# Patient Record
Sex: Female | Born: 1945 | Race: White | Hispanic: No | Marital: Married | Smoking: Former smoker
Health system: Southern US, Community
[De-identification: ages and names within clinical notes are randomized; demographics above are authoritative.]

## PROBLEM LIST (undated history)

## (undated) DIAGNOSIS — R0789 Other chest pain: Secondary | ICD-10-CM

## (undated) DIAGNOSIS — K31819 Angiodysplasia of stomach and duodenum without bleeding: Secondary | ICD-10-CM

## (undated) DIAGNOSIS — R55 Syncope and collapse: Secondary | ICD-10-CM

## (undated) DIAGNOSIS — J449 Chronic obstructive pulmonary disease, unspecified: Secondary | ICD-10-CM

## (undated) DIAGNOSIS — Z8679 Personal history of other diseases of the circulatory system: Secondary | ICD-10-CM

## (undated) DIAGNOSIS — D649 Anemia, unspecified: Secondary | ICD-10-CM

## (undated) DIAGNOSIS — R002 Palpitations: Secondary | ICD-10-CM

## (undated) DIAGNOSIS — J309 Allergic rhinitis, unspecified: Secondary | ICD-10-CM

## (undated) DIAGNOSIS — H9190 Unspecified hearing loss, unspecified ear: Secondary | ICD-10-CM

## (undated) DIAGNOSIS — E871 Hypo-osmolality and hyponatremia: Secondary | ICD-10-CM

## (undated) DIAGNOSIS — J328 Other chronic sinusitis: Secondary | ICD-10-CM

## (undated) DIAGNOSIS — M1990 Unspecified osteoarthritis, unspecified site: Secondary | ICD-10-CM

## (undated) DIAGNOSIS — L0390 Cellulitis, unspecified: Secondary | ICD-10-CM

## (undated) HISTORY — DX: Unspecified hearing loss, unspecified ear: H91.90

## (undated) HISTORY — DX: Angiodysplasia of stomach and duodenum without bleeding: K31.819

## (undated) HISTORY — DX: Chronic obstructive pulmonary disease, unspecified: J44.9

## (undated) HISTORY — DX: Personal history of other diseases of the circulatory system: Z86.79

## (undated) HISTORY — DX: Other chronic sinusitis: J32.8

## (undated) HISTORY — PX: RHINOPLASTY: SUR1284

## (undated) HISTORY — PX: CHOLECYSTECTOMY: SHX55

## (undated) HISTORY — DX: Palpitations: R00.2

## (undated) HISTORY — PX: BREAST SURGERY: SHX581

## (undated) HISTORY — DX: Cellulitis, unspecified: L03.90

## (undated) HISTORY — PX: BREAST ENHANCEMENT SURGERY: SHX7

## (undated) HISTORY — DX: Other chest pain: R07.89

## (undated) HISTORY — DX: Hypo-osmolality and hyponatremia: E87.1

## (undated) HISTORY — PX: BLADDER SURGERY: SHX569

## (undated) HISTORY — PX: TONSILLECTOMY: SUR1361

## (undated) HISTORY — DX: Syncope and collapse: R55

## (undated) HISTORY — PX: BACK SURGERY: SHX140

## (undated) HISTORY — PX: APPENDECTOMY: SHX54

## (undated) HISTORY — PX: TOTAL ABDOMINAL HYSTERECTOMY: SHX209

## (undated) HISTORY — PX: COLON SURGERY: SHX602

## (undated) HISTORY — PX: EYE SURGERY: SHX253

## (undated) HISTORY — DX: Allergic rhinitis, unspecified: J30.9

---

## 2000-03-07 ENCOUNTER — Encounter: Payer: Self-pay | Admitting: Urology

## 2000-03-07 ENCOUNTER — Encounter: Admission: RE | Admit: 2000-03-07 | Discharge: 2000-03-07 | Payer: Self-pay | Admitting: Urology

## 2004-03-09 ENCOUNTER — Ambulatory Visit (HOSPITAL_COMMUNITY): Admission: RE | Admit: 2004-03-09 | Discharge: 2004-03-09 | Payer: Self-pay | Admitting: Urology

## 2009-05-20 ENCOUNTER — Encounter: Payer: Self-pay | Admitting: Internal Medicine

## 2009-05-21 ENCOUNTER — Ambulatory Visit: Payer: Self-pay | Admitting: Internal Medicine

## 2009-05-21 DIAGNOSIS — H9190 Unspecified hearing loss, unspecified ear: Secondary | ICD-10-CM | POA: Insufficient documentation

## 2009-05-21 DIAGNOSIS — J328 Other chronic sinusitis: Secondary | ICD-10-CM | POA: Insufficient documentation

## 2009-05-21 DIAGNOSIS — J309 Allergic rhinitis, unspecified: Secondary | ICD-10-CM | POA: Insufficient documentation

## 2009-05-26 ENCOUNTER — Encounter: Payer: Self-pay | Admitting: Internal Medicine

## 2009-05-26 DIAGNOSIS — Z8679 Personal history of other diseases of the circulatory system: Secondary | ICD-10-CM | POA: Insufficient documentation

## 2009-05-28 ENCOUNTER — Telehealth: Payer: Self-pay | Admitting: Internal Medicine

## 2009-05-31 ENCOUNTER — Telehealth (INDEPENDENT_AMBULATORY_CARE_PROVIDER_SITE_OTHER): Payer: Self-pay | Admitting: *Deleted

## 2009-06-03 ENCOUNTER — Ambulatory Visit: Payer: Self-pay | Admitting: Internal Medicine

## 2009-06-08 ENCOUNTER — Ambulatory Visit: Payer: Self-pay | Admitting: Internal Medicine

## 2009-06-16 ENCOUNTER — Ambulatory Visit: Payer: Self-pay | Admitting: Internal Medicine

## 2009-06-21 ENCOUNTER — Encounter: Payer: Self-pay | Admitting: Internal Medicine

## 2009-06-22 ENCOUNTER — Ambulatory Visit: Payer: Self-pay | Admitting: Internal Medicine

## 2009-07-02 ENCOUNTER — Ambulatory Visit: Payer: Self-pay | Admitting: Internal Medicine

## 2009-07-06 ENCOUNTER — Ambulatory Visit: Payer: Self-pay | Admitting: Internal Medicine

## 2009-07-23 ENCOUNTER — Ambulatory Visit: Payer: Self-pay | Admitting: Internal Medicine

## 2009-08-02 ENCOUNTER — Ambulatory Visit: Payer: Self-pay | Admitting: Internal Medicine

## 2009-08-16 ENCOUNTER — Ambulatory Visit: Payer: Self-pay | Admitting: Internal Medicine

## 2009-08-26 ENCOUNTER — Ambulatory Visit: Payer: Self-pay | Admitting: Internal Medicine

## 2009-09-03 ENCOUNTER — Ambulatory Visit: Payer: Self-pay | Admitting: Internal Medicine

## 2009-09-10 ENCOUNTER — Ambulatory Visit: Payer: Self-pay | Admitting: Internal Medicine

## 2009-09-17 ENCOUNTER — Ambulatory Visit: Payer: Self-pay | Admitting: Internal Medicine

## 2009-09-24 ENCOUNTER — Ambulatory Visit: Payer: Self-pay | Admitting: Internal Medicine

## 2009-10-01 ENCOUNTER — Ambulatory Visit: Payer: Self-pay | Admitting: Internal Medicine

## 2009-10-08 ENCOUNTER — Encounter: Admission: RE | Admit: 2009-10-08 | Discharge: 2009-10-08 | Payer: Self-pay | Admitting: Otolaryngology

## 2009-10-18 ENCOUNTER — Ambulatory Visit: Payer: Self-pay | Admitting: Internal Medicine

## 2009-11-02 ENCOUNTER — Ambulatory Visit: Payer: Self-pay | Admitting: Internal Medicine

## 2009-11-12 ENCOUNTER — Ambulatory Visit: Payer: Self-pay | Admitting: Internal Medicine

## 2009-11-19 ENCOUNTER — Ambulatory Visit: Payer: Self-pay | Admitting: Internal Medicine

## 2009-12-01 ENCOUNTER — Ambulatory Visit: Payer: Self-pay | Admitting: Internal Medicine

## 2009-12-16 ENCOUNTER — Ambulatory Visit: Payer: Self-pay | Admitting: Internal Medicine

## 2009-12-17 ENCOUNTER — Ambulatory Visit: Payer: Self-pay | Admitting: Internal Medicine

## 2009-12-27 ENCOUNTER — Ambulatory Visit: Payer: Self-pay | Admitting: Internal Medicine

## 2010-01-07 ENCOUNTER — Ambulatory Visit: Payer: Self-pay | Admitting: Internal Medicine

## 2010-01-19 ENCOUNTER — Ambulatory Visit: Payer: Self-pay | Admitting: Internal Medicine

## 2010-02-03 ENCOUNTER — Ambulatory Visit: Payer: Self-pay | Admitting: Internal Medicine

## 2010-02-16 ENCOUNTER — Ambulatory Visit: Payer: Self-pay | Admitting: Internal Medicine

## 2010-03-03 ENCOUNTER — Ambulatory Visit: Payer: Self-pay | Admitting: Internal Medicine

## 2010-03-23 ENCOUNTER — Ambulatory Visit: Payer: Self-pay | Admitting: Internal Medicine

## 2010-04-19 ENCOUNTER — Ambulatory Visit: Payer: Self-pay | Admitting: Internal Medicine

## 2010-05-11 ENCOUNTER — Ambulatory Visit: Payer: Self-pay | Admitting: Internal Medicine

## 2010-06-03 ENCOUNTER — Ambulatory Visit: Payer: Self-pay | Admitting: Internal Medicine

## 2010-06-22 ENCOUNTER — Ambulatory Visit: Payer: Self-pay | Admitting: Internal Medicine

## 2010-07-18 ENCOUNTER — Ambulatory Visit: Payer: Self-pay | Admitting: Internal Medicine

## 2010-08-27 ENCOUNTER — Ambulatory Visit: Payer: Self-pay | Admitting: Internal Medicine

## 2010-09-13 NOTE — Miscellaneous (Signed)
Summary: Injection Record / [facility]    Injection Record / [facility]    Imported By: Lennie Odor 04/15/2010 10:59:34  _____________________________________________________________________  External Attachment:    Type:   Image     Comment:   External Document

## 2010-09-13 NOTE — Assessment & Plan Note (Signed)
Summary: ALLERGIES - SELF REFERRAL///kp   Vital Signs:  Patient profile:   65 year old Wade Weight:      100.38 pounds O2 Sat:      98 % on Room air Pulse rate:   88 / minute BP sitting:   110 / 78  (left arm) Cuff size:   regular  Vitals Entered By: Reynaldo Minium CMA (May 21, 2009 2:12 PM)  O2 Flow:  Room air  Primary Provider/Referring Provider:  Fayrene Fearing [facility] [place]   History of Present Illness: May 21, 2009 62 yoF self referred for allergy evaluation as she moves to this area from [place]. She relates adult onset of rhinitis and recurrent sinusitis around 55 [place]. She had sinus infections until she started allergy vaccine and she considers that a big help.She moved top [place] until 2003, and now moves here as of 2 weeks ago. She had been on allergy vaccine with no significant problems, occasional small local reaction. Never needed Epipen. Last shot was in July- 1:10, getting inj every 2 weeks. Ear pops, rhinorhea, nose and eyes itch. Soe dry cough but no wheeze. 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 No problem with latex, aspirin, contrast dye, food or cosmetics.  Wears hearing aird on right after a sinusitis. Nevery myringotomy tubes. RAST test resul;ts from 07/25/04- grass mix, Oak, molds, dog, dust mite. Home- encasings, no pets, rental condo/ carpet/ cetral air, no mold.   Preventive Screening-Counseling & Management  Alcohol-Tobacco     Smoking Status: quit  Current Medications (verified): 1)  Premarin 0.625 Mg Tabs (Estrogens Conjugated) .... Take 1 By Mouth Once Daily 2)  Flomax 0.4 Mg Caps (Tamsulosin Hcl) .... Take 1 By Mouth At Bedtime 3)  Lorazepam 0.5 Mg Tabs (Lorazepam) .... Take 1/2 By Mouth Two Times A Day 4)  Spironolactone 100 Mg Tabs (Spironolactone) .... 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 difficile gastroenteritis Father- died smoker, lung cancer Brother- died liver cancer GM- bronchitis, asthma GM- MI  Social History: Patient states former smoker. 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  The patient denies shortness of breath with activity, shortness of breath at rest, productive cough, coughing up blood, chest pain, acid heartburn, indigestion, loss of appetite, weight change, abdominal pain, difficulty swallowing, tooth/dental problems, headaches, nasal congestion/difficulty breathing through nose, sneezing, itching, ear ache, depression, hand/feet swelling, joint stiffness or pain, rash, change in color of mucus, and fever.    Physical Exam  Additional Exam:  General: A/Ox3; pleasant and cooperative, NAD, trim SKIN: no rash, lesions NODES: no lymphadenopathy HEENT: /AT, EOM- WNL, Conjuctivae- clear, PERRLA, TM-WNL, Nose- clear, Throat- clear and wnl, Melampatti II NECK: Supple w/ fair ROM, JVD- none, normal carotid impulses w/o bruits Thyroid- normal to palpation CHEST: Clear to P&A HEART: RRR, no m/g/r heard ABDOMEN: Soft and nl; nml bowel sounds; no organomegaly or masses noted ZOX:WRUE, nl pulses, no edema  NEURO: Grossly intact to observation      Impression & Recommendations:  Problem # 1:  ALLERGIC RHINITIS (ICD-477.9)  She is educated and compliant with environmental precaution issues. She is aware of allergy vaccine issues, risk/ goals, anaphyllaxis and epipen issues. She keeps an adrenalin injector because of remote hx of large local bee  sting reaction. We can restart allergy vaccine for now based on her 2007 testing, rediuced to 0.1 ml of 1:50 then adbvanced per protocal. She is interested in updating testing in the Spring and that is reasonable. she will need refill of her other meds.  Her updated medication list for this problem includes:    Fluticasone Propionate 50 Mcg/act Susp (Fluticasone propionate) .Marland Kitchen... 1-2 puffs each nostril once daily    Zyrtec Allergy 10 Mg Tabs (Cetirizine hcl) .Marland Kitchen... 1 daily as needed for allergy  Problem # 2:  RHINOSINUSITIS, CHRONIC (ICD-473.8) Not active currently  Problem # 3:  UNSPECIFIED HEARING LOSS (ICD-389.9)  She has unilateral hearing loss attributed to a sinusitis in the past. We will have her see ENT.  Medications Added to Medication List This Visit: 1)  Premarin 0.625 Mg Tabs (Estrogens conjugated) .... Take 1 by mouth once daily 2)  Flomax 0.4 Mg Caps (Tamsulosin hcl) .... Take 1 by mouth at bedtime 3)  Lorazepam 0.5 Mg Tabs (Lorazepam) .... Take 1/2 by mouth two times a day 4)  Spironolactone 100 Mg Tabs (Spironolactone) .... Take 1/2 by mouth once daily 5)  Fluticasone Propionate 50 Mcg/act Susp (Fluticasone propionate) .Marland Kitchen.. 1-2 puffs each nostril once daily 6)  Zyrtec Allergy 10 Mg Tabs (Cetirizine hcl) .Marland Kitchen.. 1 daily as needed for allergy 7)  Epipen 0.3 Mg/0.16ml Devi (Epinephrine) .... For severe allergic reaction 8)  Allergy Vaccine, Gh Based On Previous Rx  .... Start 1:50 based on rast outside  Other Orders: New Patient Level IV (16109) ENT Referral (ENT)  Patient Instructions: 1)  Please schedule a follow-up appointment in 2 months. 2)  The allergy lab will contact you when your vaccine is ready to start. Meanwhile we will resume allergy vaccine here., building slowly. Prescriptions: EPIPEN 0.3 MG/0.3ML DEVI (EPINEPHRINE) For severe allergic reaction  #1 x prn   Entered and Authorized by:   Waymon Budge MD   Signed by:   Waymon Budge MD on 05/21/2009   Method  used:   Print then Give to Patient   RxID:   [id number] FLUTICASONE PROPIONATE 50 MCG/ACT SUSP (FLUTICASONE PROPIONATE) 1-2 puffs each nostril once daily  #1 x prn   Entered and Authorized by:   Waymon Budge MD   Signed by:   Waymon Budge MD on 05/21/2009   Method used:   Print then Give to Patient   RxID:   [id number]

## 2010-09-13 NOTE — Consult Note (Signed)
Summary: [facility]   Imported By: Sherian Rein 07/19/2009 08:10:55  _____________________________________________________________________  External Attachment:    Type:   Image     Comment:   External Document

## 2010-09-13 NOTE — Miscellaneous (Signed)
Summary: Injection Orders / [facility]    Injection Orders / [facility]    Imported By: Lennie Odor 01/11/2010 14:57:55  _____________________________________________________________________  External Attachment:    Type:   Image     Comment:   External Document

## 2010-09-13 NOTE — Miscellaneous (Signed)
Summary: [facility]  [facility]   Imported By: Sherian Rein 06/04/2009 09:06:56  _____________________________________________________________________  External Attachment:    Type:   Image     Comment:   External Document

## 2010-09-13 NOTE — Letter (Signed)
Summary: [facility]   Imported By: Sherian Rein 06/04/2009 09:08:31  _____________________________________________________________________  External Attachment:    Type:   Image     Comment:   External Document

## 2010-09-13 NOTE — Progress Notes (Signed)
Summary: pt.'s ph.disconnected,can't call to say your vac.is ready. TS

## 2010-09-13 NOTE — Progress Notes (Signed)
Summary: ALLERGY SHOTS  Phone Note Call from Patient Call back at [phone]   Caller: Patient Call For: YOUNG Summary of Call: CALLING TO SEE WHEN SHE WILL BE ABLE TO START TAKING ALLERGY SHOTS Initial call taken by: Rickard Patience,  May 31, 2009 11:59 AM  Follow-up for Phone Call        Please advise.Michel Bickers [facility]  May 31, 2009 12:09 PM  Additional Follow-up for Phone Call Additional follow up Details #1::        Tammy- Please contact her at [phone] about starting her shots. Additional Follow-up by: Waymon Budge MD,  May 31, 2009 7:32 PM

## 2010-09-21 DIAGNOSIS — J301 Allergic rhinitis due to pollen: Secondary | ICD-10-CM

## 2010-10-06 ENCOUNTER — Ambulatory Visit (INDEPENDENT_AMBULATORY_CARE_PROVIDER_SITE_OTHER): Payer: BC Managed Care – PPO

## 2010-10-06 DIAGNOSIS — J301 Allergic rhinitis due to pollen: Secondary | ICD-10-CM

## 2010-10-20 ENCOUNTER — Encounter: Payer: Self-pay | Admitting: Internal Medicine

## 2010-10-20 ENCOUNTER — Ambulatory Visit (INDEPENDENT_AMBULATORY_CARE_PROVIDER_SITE_OTHER): Payer: BC Managed Care – PPO

## 2010-10-20 DIAGNOSIS — J301 Allergic rhinitis due to pollen: Secondary | ICD-10-CM

## 2010-10-25 NOTE — Assessment & Plan Note (Signed)
Summary: ALLERGY/CB   Nurse Visit   Allergies: No Known Drug Allergies  Orders Added: 1)  Allergy Injection (1) [95115] 

## 2010-11-02 ENCOUNTER — Ambulatory Visit (INDEPENDENT_AMBULATORY_CARE_PROVIDER_SITE_OTHER): Payer: BC Managed Care – PPO

## 2010-11-02 DIAGNOSIS — J301 Allergic rhinitis due to pollen: Secondary | ICD-10-CM

## 2010-11-14 ENCOUNTER — Ambulatory Visit (INDEPENDENT_AMBULATORY_CARE_PROVIDER_SITE_OTHER): Payer: BC Managed Care – PPO

## 2010-11-14 DIAGNOSIS — J301 Allergic rhinitis due to pollen: Secondary | ICD-10-CM

## 2010-11-28 ENCOUNTER — Ambulatory Visit (INDEPENDENT_AMBULATORY_CARE_PROVIDER_SITE_OTHER): Payer: BC Managed Care – PPO

## 2010-11-28 DIAGNOSIS — J309 Allergic rhinitis, unspecified: Secondary | ICD-10-CM

## 2010-12-15 ENCOUNTER — Ambulatory Visit (INDEPENDENT_AMBULATORY_CARE_PROVIDER_SITE_OTHER): Payer: BC Managed Care – PPO

## 2010-12-15 DIAGNOSIS — J309 Allergic rhinitis, unspecified: Secondary | ICD-10-CM

## 2010-12-30 ENCOUNTER — Ambulatory Visit (INDEPENDENT_AMBULATORY_CARE_PROVIDER_SITE_OTHER): Payer: BC Managed Care – PPO

## 2010-12-30 DIAGNOSIS — J309 Allergic rhinitis, unspecified: Secondary | ICD-10-CM

## 2011-01-16 ENCOUNTER — Encounter: Payer: Self-pay | Admitting: Internal Medicine

## 2011-01-25 ENCOUNTER — Ambulatory Visit (INDEPENDENT_AMBULATORY_CARE_PROVIDER_SITE_OTHER): Payer: BC Managed Care – PPO

## 2011-01-25 DIAGNOSIS — J309 Allergic rhinitis, unspecified: Secondary | ICD-10-CM

## 2011-02-07 ENCOUNTER — Ambulatory Visit (INDEPENDENT_AMBULATORY_CARE_PROVIDER_SITE_OTHER): Payer: BC Managed Care – PPO

## 2011-02-07 DIAGNOSIS — J309 Allergic rhinitis, unspecified: Secondary | ICD-10-CM

## 2011-02-22 ENCOUNTER — Ambulatory Visit (INDEPENDENT_AMBULATORY_CARE_PROVIDER_SITE_OTHER): Payer: BC Managed Care – PPO

## 2011-02-22 DIAGNOSIS — J309 Allergic rhinitis, unspecified: Secondary | ICD-10-CM

## 2011-02-23 ENCOUNTER — Ambulatory Visit (INDEPENDENT_AMBULATORY_CARE_PROVIDER_SITE_OTHER): Payer: BC Managed Care – PPO

## 2011-02-23 DIAGNOSIS — J309 Allergic rhinitis, unspecified: Secondary | ICD-10-CM

## 2011-03-03 ENCOUNTER — Ambulatory Visit: Payer: BC Managed Care – PPO | Admitting: Internal Medicine

## 2011-03-09 ENCOUNTER — Ambulatory Visit (INDEPENDENT_AMBULATORY_CARE_PROVIDER_SITE_OTHER): Payer: BC Managed Care – PPO | Admitting: Internal Medicine

## 2011-03-09 ENCOUNTER — Encounter: Payer: Self-pay | Admitting: Internal Medicine

## 2011-03-09 ENCOUNTER — Ambulatory Visit (INDEPENDENT_AMBULATORY_CARE_PROVIDER_SITE_OTHER): Payer: BC Managed Care – PPO

## 2011-03-09 VITALS — BP 110/56 | HR 86 | Ht 60.0 in | Wt 101.0 lb

## 2011-03-09 DIAGNOSIS — J301 Allergic rhinitis due to pollen: Secondary | ICD-10-CM

## 2011-03-09 DIAGNOSIS — J309 Allergic rhinitis, unspecified: Secondary | ICD-10-CM

## 2011-03-09 MED ORDER — PREDNISONE (PAK) 10 MG PO TABS: ORAL_TABLET | ORAL | Status: DC

## 2011-03-09 MED ORDER — FLUTICASONE PROPIONATE 50 MCG/ACT NA SUSP: 2.0000 | Freq: Every day | NASAL | Status: DC

## 2011-03-09 NOTE — Progress Notes (Signed)
  Subjective:    Patient ID: Haley Wade, female    DOB: 12/24/1945, 65 y.o.   MRN: [id number]  HPI 03/09/11- 22 yo former smoker followed for allergic rhinitis, hx chronic rhinosinusitis Last couple of days she has felt some left sided sinus and ear pressure. Hearing ok. Blows nose and sneezes just on morning waking with a little cough. Not much different outside. Difficult to get here from work for allergy vaccine. We discussed protocols and allergy lab hours again. Marland Kitchen Has not needed zyrtec or flonase.   Review of Systems Constitutional:   No-   weight loss, night sweats, fevers, chills, fatigue, lassitude. HEENT:   No-   headaches, difficulty swallowing, tooth/dental problems, sore throat,                   CV:  No-   chest pain, orthopnea, PND, swelling in lower extremities, anasarca, dizziness, palpitations  GI:  No-   heartburn, indigestion, abdominal pain, nausea, vomiting, diarrhea,                 change in bowel habits, loss of appetite  Resp: No-   shortness of breath with exertion or at rest.  No-  excess mucus,            + minimal cough No-  coughing up of blood.              No-   change in color of mucus.  No- wheezing.    Skin: No-   rash or lesions.  GU: No-   dysuria, change in color of urine, no urgency or frequency.  No- flank pain.  MS:  No-   joint pain or swelling.  No- decreased range of motion.  No- back pain.  Psych:  No- change in mood or affect. No depression or anxiety.  No memory loss.      Objective:   Physical Exam General- Alert, Oriented, Affect-appropriate, Distress- none acute   slender Skin- rash-none, lesions- none, excoriation- none Lymphadenopathy- none Head- atraumatic            Eyes- Gross vision intact, PERRLA, conjunctivae clear secretions            Ears- Hearing, canals  Good light reflex,no retraction or erythema     + right hearing aid            Nose- Clear, no- Septal dev, mucus, polyps, erosion, perforation   Throat- Mallampati II , mucosa clear , drainage- none, tonsils- atrophic Neck- flexible , trachea midline, no stridor , thyroid nl, carotid no bruit Chest - symmetrical excursion , unlabored           Heart/CV- RRR , no murmur , no gallop  , no rub, nl s1 s2                           - JVD- none , edema- none, stasis changes- none, varices- none           Lung- clear to P&A, wheeze- none, cough- none , dullness-none, rub- none           Chest wall-  Abd- tender-no, distended-no, bowel sounds-present, HSM- no Br/ Gen/ Rectal- Not done, not indicated Extrem- cyanosis- none, clubbing, none, atrophy- none, strength- nl Neuro- grossly intact to observation         Assessment & Plan:

## 2011-03-09 NOTE — Patient Instructions (Signed)
Restart flonase - script sent  Script to hold for prednisone taper  Consider trying Neti pot  Continue building allergy vaccine

## 2011-03-09 NOTE — Assessment & Plan Note (Signed)
Mild full feeling without obstruction. Encouraged continued building of allergy vaccine. Suggest she try her Neti pot  We will refill flonase. Claritin works well.

## 2011-03-27 ENCOUNTER — Ambulatory Visit (INDEPENDENT_AMBULATORY_CARE_PROVIDER_SITE_OTHER): Payer: BC Managed Care – PPO

## 2011-03-27 DIAGNOSIS — J309 Allergic rhinitis, unspecified: Secondary | ICD-10-CM

## 2011-04-20 ENCOUNTER — Ambulatory Visit (INDEPENDENT_AMBULATORY_CARE_PROVIDER_SITE_OTHER): Payer: BC Managed Care – PPO

## 2011-04-20 DIAGNOSIS — J309 Allergic rhinitis, unspecified: Secondary | ICD-10-CM

## 2011-05-02 ENCOUNTER — Other Ambulatory Visit: Payer: Self-pay | Admitting: Neurology

## 2011-05-02 DIAGNOSIS — A881 Epidemic vertigo: Secondary | ICD-10-CM

## 2011-05-02 DIAGNOSIS — H547 Unspecified visual loss: Secondary | ICD-10-CM

## 2011-05-02 DIAGNOSIS — H9190 Unspecified hearing loss, unspecified ear: Secondary | ICD-10-CM

## 2011-05-05 ENCOUNTER — Ambulatory Visit
Admission: RE | Admit: 2011-05-05 | Discharge: 2011-05-05 | Disposition: A | Discharge status: Home / Self Care | Payer: BC Managed Care – PPO | Source: Ambulatory Visit | Attending: Neurology | Admitting: Neurology

## 2011-05-05 DIAGNOSIS — H547 Unspecified visual loss: Secondary | ICD-10-CM

## 2011-05-05 DIAGNOSIS — A881 Epidemic vertigo: Secondary | ICD-10-CM

## 2011-05-05 DIAGNOSIS — H9190 Unspecified hearing loss, unspecified ear: Secondary | ICD-10-CM

## 2011-05-05 MED ORDER — IOHEXOL 350 MG/ML SOLN: 100.0000 mL | Freq: Once | INTRAVENOUS | Status: AC | PRN

## 2011-05-17 ENCOUNTER — Ambulatory Visit (INDEPENDENT_AMBULATORY_CARE_PROVIDER_SITE_OTHER): Payer: BC Managed Care – PPO

## 2011-05-17 DIAGNOSIS — J309 Allergic rhinitis, unspecified: Secondary | ICD-10-CM

## 2011-05-30 ENCOUNTER — Ambulatory Visit (INDEPENDENT_AMBULATORY_CARE_PROVIDER_SITE_OTHER): Payer: BC Managed Care – PPO

## 2011-05-30 DIAGNOSIS — J309 Allergic rhinitis, unspecified: Secondary | ICD-10-CM

## 2011-06-26 ENCOUNTER — Ambulatory Visit (INDEPENDENT_AMBULATORY_CARE_PROVIDER_SITE_OTHER): Payer: BC Managed Care – PPO

## 2011-06-26 DIAGNOSIS — J309 Allergic rhinitis, unspecified: Secondary | ICD-10-CM

## 2011-06-26 DIAGNOSIS — Z23 Encounter for immunization: Secondary | ICD-10-CM

## 2011-07-12 ENCOUNTER — Ambulatory Visit (INDEPENDENT_AMBULATORY_CARE_PROVIDER_SITE_OTHER): Payer: BC Managed Care – PPO

## 2011-07-12 DIAGNOSIS — J309 Allergic rhinitis, unspecified: Secondary | ICD-10-CM

## 2011-07-17 ENCOUNTER — Ambulatory Visit (INDEPENDENT_AMBULATORY_CARE_PROVIDER_SITE_OTHER): Payer: BC Managed Care – PPO

## 2011-07-17 DIAGNOSIS — J309 Allergic rhinitis, unspecified: Secondary | ICD-10-CM

## 2011-07-24 ENCOUNTER — Ambulatory Visit (INDEPENDENT_AMBULATORY_CARE_PROVIDER_SITE_OTHER): Payer: BC Managed Care – PPO

## 2011-07-24 DIAGNOSIS — J309 Allergic rhinitis, unspecified: Secondary | ICD-10-CM

## 2011-07-25 ENCOUNTER — Telehealth: Payer: Self-pay | Admitting: Internal Medicine

## 2011-07-25 NOTE — Telephone Encounter (Signed)
LMTCB

## 2011-07-26 NOTE — Telephone Encounter (Signed)
ATC line busy x 2 WCB 

## 2011-07-26 NOTE — Telephone Encounter (Signed)
Returning call.

## 2011-07-27 NOTE — Telephone Encounter (Signed)
Patient states that her PCP is wanting the pt to get a PFT. Pt has seen CDY for her allergies but he  is not ordering the test and the pt does not want to come in to see CDY. I told her that we can schedule her for the PFT but the ordering physician will need to call for the appt. She says she will speak with her PCP and have them call if still needing the test.

## 2011-07-31 ENCOUNTER — Ambulatory Visit (INDEPENDENT_AMBULATORY_CARE_PROVIDER_SITE_OTHER): Payer: BC Managed Care – PPO

## 2011-07-31 ENCOUNTER — Encounter: Payer: Self-pay | Admitting: Internal Medicine

## 2011-07-31 DIAGNOSIS — J309 Allergic rhinitis, unspecified: Secondary | ICD-10-CM

## 2011-08-10 ENCOUNTER — Ambulatory Visit (INDEPENDENT_AMBULATORY_CARE_PROVIDER_SITE_OTHER): Payer: BC Managed Care – PPO

## 2011-08-10 DIAGNOSIS — J309 Allergic rhinitis, unspecified: Secondary | ICD-10-CM

## 2011-08-14 ENCOUNTER — Ambulatory Visit (INDEPENDENT_AMBULATORY_CARE_PROVIDER_SITE_OTHER): Payer: BC Managed Care – PPO

## 2011-08-14 DIAGNOSIS — J309 Allergic rhinitis, unspecified: Secondary | ICD-10-CM

## 2011-08-23 ENCOUNTER — Ambulatory Visit (INDEPENDENT_AMBULATORY_CARE_PROVIDER_SITE_OTHER): Payer: BC Managed Care – PPO

## 2011-08-23 DIAGNOSIS — J309 Allergic rhinitis, unspecified: Secondary | ICD-10-CM

## 2011-08-28 ENCOUNTER — Ambulatory Visit (INDEPENDENT_AMBULATORY_CARE_PROVIDER_SITE_OTHER): Payer: BC Managed Care – PPO

## 2011-08-28 DIAGNOSIS — J309 Allergic rhinitis, unspecified: Secondary | ICD-10-CM

## 2011-09-05 ENCOUNTER — Ambulatory Visit (INDEPENDENT_AMBULATORY_CARE_PROVIDER_SITE_OTHER): Payer: BC Managed Care – PPO

## 2011-09-05 DIAGNOSIS — J309 Allergic rhinitis, unspecified: Secondary | ICD-10-CM

## 2011-09-13 ENCOUNTER — Ambulatory Visit (INDEPENDENT_AMBULATORY_CARE_PROVIDER_SITE_OTHER): Payer: BC Managed Care – PPO

## 2011-09-13 DIAGNOSIS — J309 Allergic rhinitis, unspecified: Secondary | ICD-10-CM

## 2011-09-19 ENCOUNTER — Ambulatory Visit (INDEPENDENT_AMBULATORY_CARE_PROVIDER_SITE_OTHER): Payer: BC Managed Care – PPO

## 2011-09-19 DIAGNOSIS — J309 Allergic rhinitis, unspecified: Secondary | ICD-10-CM

## 2011-10-04 ENCOUNTER — Ambulatory Visit (INDEPENDENT_AMBULATORY_CARE_PROVIDER_SITE_OTHER): Payer: BC Managed Care – PPO

## 2011-10-04 DIAGNOSIS — J309 Allergic rhinitis, unspecified: Secondary | ICD-10-CM

## 2011-10-16 ENCOUNTER — Ambulatory Visit (INDEPENDENT_AMBULATORY_CARE_PROVIDER_SITE_OTHER): Payer: BC Managed Care – PPO

## 2011-10-16 ENCOUNTER — Telehealth: Payer: Self-pay | Admitting: Internal Medicine

## 2011-10-16 DIAGNOSIS — J309 Allergic rhinitis, unspecified: Secondary | ICD-10-CM

## 2011-10-16 NOTE — Telephone Encounter (Signed)
LMTCB- I have not seen any papers from Dr Michaelle Copas office; I have asked the patient to call and speak with me about this.

## 2011-10-17 ENCOUNTER — Ambulatory Visit (INDEPENDENT_AMBULATORY_CARE_PROVIDER_SITE_OTHER): Payer: BC Managed Care – PPO

## 2011-10-17 DIAGNOSIS — J309 Allergic rhinitis, unspecified: Secondary | ICD-10-CM

## 2011-10-17 NOTE — Telephone Encounter (Signed)
Patient is aware that we have not gotten any papers from Dr Katrinka Blazing; pt stated she called Dr Michaelle Copas office today and requested they send the records asap. Mrs. Haley Wade is aware we will keep our eyes open for them and will have CY review and scan in [facility] asap.

## 2011-10-17 NOTE — Telephone Encounter (Signed)
Please cb at [phone]

## 2011-10-17 NOTE — Telephone Encounter (Signed)
Spoke with Florentina Addison regarding this -- she would like to call pt back regarding this.  Will forward message to her to call pt back.

## 2011-10-25 ENCOUNTER — Ambulatory Visit (INDEPENDENT_AMBULATORY_CARE_PROVIDER_SITE_OTHER): Payer: BC Managed Care – PPO

## 2011-10-25 DIAGNOSIS — J309 Allergic rhinitis, unspecified: Secondary | ICD-10-CM

## 2011-10-30 ENCOUNTER — Encounter: Payer: Self-pay | Admitting: Internal Medicine

## 2011-11-01 ENCOUNTER — Ambulatory Visit (INDEPENDENT_AMBULATORY_CARE_PROVIDER_SITE_OTHER): Payer: BC Managed Care – PPO

## 2011-11-01 DIAGNOSIS — J309 Allergic rhinitis, unspecified: Secondary | ICD-10-CM

## 2011-11-06 ENCOUNTER — Ambulatory Visit (INDEPENDENT_AMBULATORY_CARE_PROVIDER_SITE_OTHER): Payer: BC Managed Care – PPO

## 2011-11-06 DIAGNOSIS — J309 Allergic rhinitis, unspecified: Secondary | ICD-10-CM

## 2011-11-14 ENCOUNTER — Ambulatory Visit (INDEPENDENT_AMBULATORY_CARE_PROVIDER_SITE_OTHER): Payer: BC Managed Care – PPO

## 2011-11-14 DIAGNOSIS — J309 Allergic rhinitis, unspecified: Secondary | ICD-10-CM

## 2011-11-20 ENCOUNTER — Institutional Professional Consult (permissible substitution): Payer: BC Managed Care – PPO | Admitting: Internal Medicine

## 2011-11-21 ENCOUNTER — Ambulatory Visit (INDEPENDENT_AMBULATORY_CARE_PROVIDER_SITE_OTHER): Payer: BC Managed Care – PPO

## 2011-11-21 DIAGNOSIS — J309 Allergic rhinitis, unspecified: Secondary | ICD-10-CM

## 2011-11-30 ENCOUNTER — Ambulatory Visit (INDEPENDENT_AMBULATORY_CARE_PROVIDER_SITE_OTHER): Payer: BC Managed Care – PPO

## 2011-11-30 DIAGNOSIS — J309 Allergic rhinitis, unspecified: Secondary | ICD-10-CM

## 2011-12-05 ENCOUNTER — Ambulatory Visit (INDEPENDENT_AMBULATORY_CARE_PROVIDER_SITE_OTHER): Payer: BC Managed Care – PPO

## 2011-12-05 DIAGNOSIS — J309 Allergic rhinitis, unspecified: Secondary | ICD-10-CM

## 2011-12-15 ENCOUNTER — Ambulatory Visit (INDEPENDENT_AMBULATORY_CARE_PROVIDER_SITE_OTHER): Payer: BC Managed Care – PPO

## 2011-12-15 DIAGNOSIS — J309 Allergic rhinitis, unspecified: Secondary | ICD-10-CM

## 2011-12-19 ENCOUNTER — Ambulatory Visit (INDEPENDENT_AMBULATORY_CARE_PROVIDER_SITE_OTHER): Payer: BC Managed Care – PPO

## 2011-12-19 DIAGNOSIS — J309 Allergic rhinitis, unspecified: Secondary | ICD-10-CM

## 2011-12-28 ENCOUNTER — Encounter: Payer: Self-pay | Admitting: Internal Medicine

## 2011-12-28 ENCOUNTER — Ambulatory Visit (INDEPENDENT_AMBULATORY_CARE_PROVIDER_SITE_OTHER): Payer: BC Managed Care – PPO | Admitting: Internal Medicine

## 2011-12-28 ENCOUNTER — Ambulatory Visit (INDEPENDENT_AMBULATORY_CARE_PROVIDER_SITE_OTHER): Payer: BC Managed Care – PPO

## 2011-12-28 VITALS — BP 128/72 | HR 71 | Ht 60.0 in | Wt 102.6 lb

## 2011-12-28 DIAGNOSIS — J309 Allergic rhinitis, unspecified: Secondary | ICD-10-CM

## 2011-12-28 DIAGNOSIS — J301 Allergic rhinitis due to pollen: Secondary | ICD-10-CM

## 2011-12-28 DIAGNOSIS — J449 Chronic obstructive pulmonary disease, unspecified: Secondary | ICD-10-CM

## 2011-12-28 MED ORDER — FLUTICASONE PROPIONATE 50 MCG/ACT NA SUSP: 2.0000 | Freq: Every day | NASAL | Status: DC

## 2011-12-28 MED ORDER — EPINEPHRINE 0.3 MG/0.3ML IJ DEVI: 0.3000 mg | Freq: Once | INTRAMUSCULAR | Status: AC

## 2011-12-28 NOTE — Patient Instructions (Addendum)
Order- schedule PFT  Dx COPD  a1AT test   I will discuss your allergy vaccine status  We will ask Dr Erick Colace office for your CXR report  Refills sent for Epipen and Flonase/ fluticasone

## 2011-12-28 NOTE — Progress Notes (Signed)
Subjective:    Patient ID: Haley Wade, female    DOB: May 07, 1946, 66 y.o.   MRN: [id number]  HPI 03/09/11- 44 yo former smoker followed for allergic rhinitis, hx chronic rhinosinusitis Last couple of days she has felt some left sided sinus and ear pressure. Hearing ok. Blows nose and sneezes just on morning waking with a little cough. Not much different outside. Difficult to get here from work for allergy vaccine. We discussed protocols and allergy lab hours again. Marland Kitchen Has not needed zyrtec or flonase.   12/28/11- 71 yo former smoker followed for allergic rhinitis, hx chronic rhinosinusitis, ? COPD self referral; Already seen for Allergies and on vaccine New problem: This summer she had palpitations and had cardiology evaluation by Dr. Katrinka Blazing for mitral valve prolapse with regurgitation. During evaluation, chest x-ray showed hyperinflation suggesting COPD. She had smoked very little many years ago. Rule out a smoking family. No history of asthma. She has been very athletic as a runner and bike rider with no concern about exertional dyspnea.  ROS-see HPI Constitutional:   No-   weight loss, night sweats, fevers, chills, fatigue, lassitude. HEENT:   No-  headaches, difficulty swallowing, tooth/dental problems, sore throat,       + sneezing, itching, ear ache, nasal congestion, post nasal drip,  CV:  No-   chest pain, orthopnea, PND, swelling in lower extremities, anasarca,  dizziness, palpitations Resp: No-   shortness of breath with exertion or at rest.              No-   productive cough,  No non-productive cough,  No- coughing up of blood.              No-   change in color of mucus.  No- wheezing.   Skin: No-   rash or lesions. GI:  No-   heartburn, indigestion, abdominal pain, nausea, vomiting,  GU:  MS:  No-   joint pain or swelling.   Neuro-     nothing unusual Psych:  No- change in mood or affect. No depression or anxiety.  No memory loss.   Objective:   Physical Exam General-  Alert, Oriented, Affect-appropriate, Distress- none acute   slender Skin- rash-none, lesions- none, excoriation- none Lymphadenopathy- none Head- atraumatic            Eyes- Gross vision intact, PERRLA, conjunctivae clear secretions            Ears- Hearing, canals  Good light reflex,no retraction or erythema     + right hearing aid            Nose- Clear, no- Septal dev, mucus, polyps, erosion, perforation             Throat- Mallampati II , mucosa clear , drainage- none, tonsils- atrophic Neck- flexible , trachea midline, no stridor , thyroid nl, carotid no bruit, external jugular veins full Chest - symmetrical excursion , unlabored           Heart/CV- RRR , no murmur , no gallop  , no rub, nl s1 s2                           - JVD- none , edema- none, stasis changes- none, varices- none           Lung- clear to P&A, wheeze- none, cough- none , dullness-none, rub- none           Chest wall-  Abd-  Br/ Gen/ Rectal- Not done, not indicated Extrem- cyanosis- none, clubbing, none, atrophy- none, strength- nl Neuro- grossly intact to observation         Assessment & Plan:

## 2012-01-02 DIAGNOSIS — J449 Chronic obstructive pulmonary disease, unspecified: Secondary | ICD-10-CM | POA: Insufficient documentation

## 2012-01-02 NOTE — Assessment & Plan Note (Addendum)
Better control on allergy vaccine. Advanced to 1:10 next order. Refill EpiPen.

## 2012-01-02 NOTE — Assessment & Plan Note (Signed)
She is probably normal. Plan-get chest x-ray report done at  [facility] by Dr. Katrinka Blazing. Check for alpha-1 antitrypsin deficiency

## 2012-01-10 ENCOUNTER — Ambulatory Visit (INDEPENDENT_AMBULATORY_CARE_PROVIDER_SITE_OTHER): Payer: BC Managed Care – PPO

## 2012-01-10 DIAGNOSIS — J309 Allergic rhinitis, unspecified: Secondary | ICD-10-CM

## 2012-01-17 ENCOUNTER — Ambulatory Visit (INDEPENDENT_AMBULATORY_CARE_PROVIDER_SITE_OTHER): Payer: BC Managed Care – PPO

## 2012-01-17 DIAGNOSIS — J309 Allergic rhinitis, unspecified: Secondary | ICD-10-CM

## 2012-01-23 ENCOUNTER — Encounter: Payer: Self-pay | Admitting: Internal Medicine

## 2012-01-26 ENCOUNTER — Ambulatory Visit (INDEPENDENT_AMBULATORY_CARE_PROVIDER_SITE_OTHER): Payer: BC Managed Care – PPO

## 2012-01-26 DIAGNOSIS — J309 Allergic rhinitis, unspecified: Secondary | ICD-10-CM

## 2012-02-02 ENCOUNTER — Ambulatory Visit (INDEPENDENT_AMBULATORY_CARE_PROVIDER_SITE_OTHER): Payer: BC Managed Care – PPO

## 2012-02-02 DIAGNOSIS — J309 Allergic rhinitis, unspecified: Secondary | ICD-10-CM

## 2012-02-14 ENCOUNTER — Ambulatory Visit (INDEPENDENT_AMBULATORY_CARE_PROVIDER_SITE_OTHER): Payer: BC Managed Care – PPO

## 2012-02-14 DIAGNOSIS — J309 Allergic rhinitis, unspecified: Secondary | ICD-10-CM

## 2012-02-28 ENCOUNTER — Encounter: Payer: Self-pay | Admitting: Internal Medicine

## 2012-02-28 ENCOUNTER — Ambulatory Visit (INDEPENDENT_AMBULATORY_CARE_PROVIDER_SITE_OTHER): Payer: BC Managed Care – PPO

## 2012-02-28 DIAGNOSIS — J309 Allergic rhinitis, unspecified: Secondary | ICD-10-CM

## 2012-03-13 ENCOUNTER — Ambulatory Visit (INDEPENDENT_AMBULATORY_CARE_PROVIDER_SITE_OTHER): Payer: BC Managed Care – PPO

## 2012-03-13 DIAGNOSIS — J309 Allergic rhinitis, unspecified: Secondary | ICD-10-CM

## 2012-03-21 ENCOUNTER — Ambulatory Visit (INDEPENDENT_AMBULATORY_CARE_PROVIDER_SITE_OTHER): Payer: BC Managed Care – PPO

## 2012-03-21 DIAGNOSIS — J309 Allergic rhinitis, unspecified: Secondary | ICD-10-CM

## 2012-04-05 ENCOUNTER — Ambulatory Visit (INDEPENDENT_AMBULATORY_CARE_PROVIDER_SITE_OTHER): Payer: BC Managed Care – PPO

## 2012-04-05 DIAGNOSIS — J309 Allergic rhinitis, unspecified: Secondary | ICD-10-CM

## 2012-04-10 ENCOUNTER — Ambulatory Visit (INDEPENDENT_AMBULATORY_CARE_PROVIDER_SITE_OTHER): Payer: BC Managed Care – PPO

## 2012-04-10 DIAGNOSIS — J309 Allergic rhinitis, unspecified: Secondary | ICD-10-CM

## 2012-04-12 ENCOUNTER — Ambulatory Visit (INDEPENDENT_AMBULATORY_CARE_PROVIDER_SITE_OTHER): Payer: BC Managed Care – PPO

## 2012-04-12 DIAGNOSIS — J309 Allergic rhinitis, unspecified: Secondary | ICD-10-CM

## 2012-04-22 ENCOUNTER — Ambulatory Visit (INDEPENDENT_AMBULATORY_CARE_PROVIDER_SITE_OTHER): Payer: BC Managed Care – PPO

## 2012-04-22 DIAGNOSIS — J309 Allergic rhinitis, unspecified: Secondary | ICD-10-CM

## 2012-05-09 ENCOUNTER — Ambulatory Visit (INDEPENDENT_AMBULATORY_CARE_PROVIDER_SITE_OTHER): Payer: BC Managed Care – PPO

## 2012-05-09 DIAGNOSIS — J309 Allergic rhinitis, unspecified: Secondary | ICD-10-CM

## 2012-05-10 ENCOUNTER — Ambulatory Visit: Payer: BC Managed Care – PPO | Admitting: Internal Medicine

## 2012-05-20 ENCOUNTER — Ambulatory Visit (INDEPENDENT_AMBULATORY_CARE_PROVIDER_SITE_OTHER): Payer: BC Managed Care – PPO

## 2012-05-20 DIAGNOSIS — J309 Allergic rhinitis, unspecified: Secondary | ICD-10-CM

## 2012-06-06 ENCOUNTER — Ambulatory Visit (INDEPENDENT_AMBULATORY_CARE_PROVIDER_SITE_OTHER): Payer: BC Managed Care – PPO

## 2012-06-06 DIAGNOSIS — J309 Allergic rhinitis, unspecified: Secondary | ICD-10-CM

## 2012-06-13 ENCOUNTER — Ambulatory Visit (INDEPENDENT_AMBULATORY_CARE_PROVIDER_SITE_OTHER): Payer: BC Managed Care – PPO

## 2012-06-13 DIAGNOSIS — J309 Allergic rhinitis, unspecified: Secondary | ICD-10-CM

## 2012-06-25 ENCOUNTER — Ambulatory Visit (INDEPENDENT_AMBULATORY_CARE_PROVIDER_SITE_OTHER): Payer: BC Managed Care – PPO

## 2012-06-25 DIAGNOSIS — Z23 Encounter for immunization: Secondary | ICD-10-CM

## 2012-06-28 ENCOUNTER — Ambulatory Visit: Payer: BC Managed Care – PPO | Admitting: Internal Medicine

## 2012-07-03 ENCOUNTER — Other Ambulatory Visit: Payer: Self-pay | Admitting: Obstetrics and Gynecology

## 2012-07-03 ENCOUNTER — Ambulatory Visit (INDEPENDENT_AMBULATORY_CARE_PROVIDER_SITE_OTHER): Payer: BC Managed Care – PPO

## 2012-07-03 DIAGNOSIS — Z1231 Encounter for screening mammogram for malignant neoplasm of breast: Secondary | ICD-10-CM

## 2012-07-03 DIAGNOSIS — J309 Allergic rhinitis, unspecified: Secondary | ICD-10-CM

## 2012-07-12 ENCOUNTER — Encounter: Payer: Self-pay | Admitting: Internal Medicine

## 2012-07-19 ENCOUNTER — Ambulatory Visit (INDEPENDENT_AMBULATORY_CARE_PROVIDER_SITE_OTHER): Payer: BC Managed Care – PPO

## 2012-07-19 DIAGNOSIS — J309 Allergic rhinitis, unspecified: Secondary | ICD-10-CM

## 2012-07-30 ENCOUNTER — Ambulatory Visit (INDEPENDENT_AMBULATORY_CARE_PROVIDER_SITE_OTHER): Payer: BC Managed Care – PPO

## 2012-07-30 DIAGNOSIS — J309 Allergic rhinitis, unspecified: Secondary | ICD-10-CM

## 2012-08-13 ENCOUNTER — Ambulatory Visit
Admission: RE | Admit: 2012-08-13 | Discharge: 2012-08-13 | Disposition: A | Discharge status: Home / Self Care | Payer: BC Managed Care – PPO | Source: Ambulatory Visit | Attending: Obstetrics and Gynecology | Admitting: Obstetrics and Gynecology

## 2012-08-13 DIAGNOSIS — Z1231 Encounter for screening mammogram for malignant neoplasm of breast: Secondary | ICD-10-CM

## 2012-08-21 ENCOUNTER — Ambulatory Visit (INDEPENDENT_AMBULATORY_CARE_PROVIDER_SITE_OTHER): Payer: BC Managed Care – PPO

## 2012-08-21 DIAGNOSIS — J309 Allergic rhinitis, unspecified: Secondary | ICD-10-CM

## 2012-09-13 ENCOUNTER — Ambulatory Visit (INDEPENDENT_AMBULATORY_CARE_PROVIDER_SITE_OTHER): Payer: BC Managed Care – PPO

## 2012-09-13 DIAGNOSIS — J309 Allergic rhinitis, unspecified: Secondary | ICD-10-CM

## 2012-09-20 ENCOUNTER — Ambulatory Visit (INDEPENDENT_AMBULATORY_CARE_PROVIDER_SITE_OTHER): Payer: BC Managed Care – PPO

## 2012-09-20 DIAGNOSIS — J309 Allergic rhinitis, unspecified: Secondary | ICD-10-CM

## 2012-10-09 ENCOUNTER — Ambulatory Visit (INDEPENDENT_AMBULATORY_CARE_PROVIDER_SITE_OTHER): Payer: BC Managed Care – PPO

## 2012-10-09 DIAGNOSIS — J309 Allergic rhinitis, unspecified: Secondary | ICD-10-CM

## 2012-10-29 ENCOUNTER — Inpatient Hospital Stay (HOSPITAL_COMMUNITY)
Admission: EM | Admit: 2012-10-29 | Discharge: 2012-11-08 | DRG: 148 | Disposition: A | Discharge status: Home / Self Care | Payer: BC Managed Care – PPO | Attending: Surgery | Admitting: Surgery

## 2012-10-29 DIAGNOSIS — D649 Anemia, unspecified: Secondary | ICD-10-CM | POA: Diagnosis not present

## 2012-10-29 DIAGNOSIS — Z792 Long term (current) use of antibiotics: Secondary | ICD-10-CM | POA: Diagnosis not present

## 2012-10-29 DIAGNOSIS — R188 Other ascites: Secondary | ICD-10-CM | POA: Diagnosis present

## 2012-10-29 DIAGNOSIS — K56609 Unspecified intestinal obstruction, unspecified as to partial versus complete obstruction: Secondary | ICD-10-CM | POA: Diagnosis not present

## 2012-10-29 DIAGNOSIS — J309 Allergic rhinitis, unspecified: Secondary | ICD-10-CM | POA: Diagnosis present

## 2012-10-29 DIAGNOSIS — R110 Nausea: Secondary | ICD-10-CM | POA: Diagnosis not present

## 2012-10-29 DIAGNOSIS — H8109 Meniere's disease, unspecified ear: Secondary | ICD-10-CM | POA: Diagnosis present

## 2012-10-29 DIAGNOSIS — Z9089 Acquired absence of other organs: Secondary | ICD-10-CM

## 2012-10-29 DIAGNOSIS — Z90710 Acquired absence of both cervix and uterus: Secondary | ICD-10-CM

## 2012-10-29 DIAGNOSIS — K559 Vascular disorder of intestine, unspecified: Secondary | ICD-10-CM | POA: Diagnosis not present

## 2012-10-29 DIAGNOSIS — F411 Generalized anxiety disorder: Secondary | ICD-10-CM | POA: Diagnosis present

## 2012-10-29 DIAGNOSIS — K565 Intestinal adhesions [bands], unspecified as to partial versus complete obstruction: Secondary | ICD-10-CM | POA: Diagnosis not present

## 2012-10-29 DIAGNOSIS — J449 Chronic obstructive pulmonary disease, unspecified: Secondary | ICD-10-CM | POA: Diagnosis present

## 2012-10-29 DIAGNOSIS — T502X5A Adverse effect of carbonic-anhydrase inhibitors, benzothiadiazides and other diuretics, initial encounter: Secondary | ICD-10-CM | POA: Diagnosis present

## 2012-10-29 DIAGNOSIS — A6000 Herpesviral infection of urogenital system, unspecified: Secondary | ICD-10-CM | POA: Diagnosis present

## 2012-10-29 DIAGNOSIS — J328 Other chronic sinusitis: Secondary | ICD-10-CM | POA: Diagnosis present

## 2012-10-29 DIAGNOSIS — I059 Rheumatic mitral valve disease, unspecified: Secondary | ICD-10-CM | POA: Diagnosis present

## 2012-10-29 DIAGNOSIS — J4489 Other specified chronic obstructive pulmonary disease: Secondary | ICD-10-CM | POA: Diagnosis present

## 2012-10-29 DIAGNOSIS — J329 Chronic sinusitis, unspecified: Secondary | ICD-10-CM | POA: Diagnosis present

## 2012-10-29 DIAGNOSIS — Z87440 Personal history of urinary (tract) infections: Secondary | ICD-10-CM | POA: Diagnosis not present

## 2012-10-29 DIAGNOSIS — K219 Gastro-esophageal reflux disease without esophagitis: Secondary | ICD-10-CM | POA: Diagnosis present

## 2012-10-29 DIAGNOSIS — Z79899 Other long term (current) drug therapy: Secondary | ICD-10-CM

## 2012-10-29 DIAGNOSIS — N8111 Cystocele, midline: Secondary | ICD-10-CM | POA: Diagnosis present

## 2012-10-29 DIAGNOSIS — E871 Hypo-osmolality and hyponatremia: Secondary | ICD-10-CM | POA: Diagnosis not present

## 2012-10-29 DIAGNOSIS — Z87891 Personal history of nicotine dependence: Secondary | ICD-10-CM | POA: Diagnosis not present

## 2012-10-29 DIAGNOSIS — IMO0002 Reserved for concepts with insufficient information to code with codable children: Secondary | ICD-10-CM | POA: Diagnosis present

## 2012-10-29 DIAGNOSIS — Y92009 Unspecified place in unspecified non-institutional (private) residence as the place of occurrence of the external cause: Secondary | ICD-10-CM

## 2012-10-29 DIAGNOSIS — Z4682 Encounter for fitting and adjustment of non-vascular catheter: Secondary | ICD-10-CM | POA: Diagnosis not present

## 2012-10-29 DIAGNOSIS — G4700 Insomnia, unspecified: Secondary | ICD-10-CM | POA: Diagnosis not present

## 2012-10-29 DIAGNOSIS — R109 Unspecified abdominal pain: Secondary | ICD-10-CM | POA: Diagnosis not present

## 2012-10-29 DIAGNOSIS — K5989 Other specified functional intestinal disorders: Secondary | ICD-10-CM | POA: Diagnosis not present

## 2012-10-29 MED ORDER — HYDROMORPHONE HCL PF 1 MG/ML IJ SOLN
1.0000 mg | Freq: Once | INTRAMUSCULAR | Status: AC
  Administered 2012-10-29: 1 mg via INTRAVENOUS
  Filled 2012-10-29: qty 1

## 2012-10-29 MED ORDER — ONDANSETRON HCL 4 MG/2ML IJ SOLN
4.0000 mg | Freq: Once | INTRAMUSCULAR | Status: AC
  Administered 2012-10-29: 4 mg via INTRAVENOUS
  Filled 2012-10-29: qty 2

## 2012-10-29 MED ORDER — SODIUM CHLORIDE 0.9 % IV BOLUS (SEPSIS)
1000.0000 mL | Freq: Once | INTRAVENOUS | Status: AC
  Administered 2012-10-29: 1000 mL via INTRAVENOUS

## 2012-10-29 NOTE — ED Notes (Signed)
Pt states that for approx. 5 hours she has had generalized abdominal pain, n/v, and has been breaking out in cold sweats. States she made soup and had some wine tonight and immediately had pain.

## 2012-10-29 NOTE — ED Provider Notes (Signed)
History     CSN: [id number]  Arrival date & time 10/29/12  2307   First MD Initiated Contact with Patient 10/29/12 2331      Chief Complaint  Patient presents with  . Abdominal Pain    (Consider location/radiation/quality/duration/timing/severity/associated sxs/prior treatment) HPI Comments: Pt with hx of prior cholecystitis, who presents with acute RUQ pain today after having small amount of alcohol and soup.  Sx have moved to the epigastrium and are severe associated with multiple episodes of n/v.  At this time she has not had diarrhea but feels that she needs to have some now.  She has no associated f/c/cough/sob/cp.  Sx are persistent, nothing makes better or worse.  Denies hx of pancreatitis.  She also reports recent UTI - on cipro and had recent colonoscopy 2/2 pain and was though to thave bacterial infection of bowels - long course of cipro at that time (2 months ago)  Patient is a 67 y.o. female presenting with abdominal pain. The history is provided by the patient.  Abdominal Pain   Past Medical History  Diagnosis Date  . Allergic rhinitis, cause unspecified   . Other chronic sinusitis   . Personal history of unspecified circulatory disease   . COPD (chronic obstructive pulmonary disease)     Past Surgical History  Procedure Laterality Date  . Rhinoplasty    . Total abdominal hysterectomy    . Tonsillectomy    . Breast enhancement surgery      revisions  . Bladder surgery      Family History  Problem Relation Age of Onset  . Other Mother     c difficile gastroenteritis  . Lung cancer Father     smoker  . Liver cancer Brother   . Asthma      grandmother  . Other      bronchitis-grandmother  . Liver disease Brother     History  Substance Use Topics  . Smoking status: Former Smoker    Types: Cigarettes    Quit date: 08/14/1980  . Smokeless tobacco: Not on file  . Alcohol Use: No    OB History   Grav Para Term Preterm Abortions TAB SAB Ect Mult  Living                  Review of Systems  Gastrointestinal: Positive for abdominal pain.  All other systems reviewed and are negative.    Allergies  Bee venom  Home Medications   Current Outpatient Rx  Name  Route  Sig  Dispense  Refill  . conjugated estrogens (PREMARIN) vaginal cream   Vaginal   Place 0.5 g vaginally 2 (two) times a week. Patient states that uses this just whenever she remembers during the week no specific days to list.         . fluticasone (FLONASE) 50 MCG/ACT nasal spray   Nasal   Place 2 sprays into the nose daily as needed for rhinitis. For nasal drainage         . LORazepam (ATIVAN) 0.5 MG tablet   Oral   Take 0.25-0.5 tablets by mouth every 8 (eight) hours as needed. Take 0.5 tablet twice a day Take 1 tablet at bedtime         . PREMARIN 0.625 MG tablet   Oral   Take 1 tablet by mouth Daily.         Marland Kitchen spironolactone (ALDACTONE) 100 MG tablet   Oral   Take 0.5 tablets by mouth 3 (  three) times daily.          . tamsulosin (FLOMAX) 0.4 MG CAPS   Oral   Take 0.4 mg by mouth daily.         Marland Kitchen ZOVIRAX 200 MG capsule   Oral   Take 1 capsule by mouth daily.         Marland Kitchen EPINEPHrine (EPIPEN) 0.3 mg/0.3 mL DEVI   Intramuscular   Inject 0.3 mLs (0.3 mg total) into the muscle once. Use as directed   1 Device   prn     BP 155/69  Pulse 102  Temp(Src) 97.6 F (36.4 C) (Oral)  Resp 16  SpO2 99%  Physical Exam  Nursing note and vitals reviewed. Constitutional: She appears well-developed and well-nourished. She appears distressed.  HENT:  Head: Normocephalic and atraumatic.  Mouth/Throat: Oropharynx is clear and moist. No oropharyngeal exudate.  Eyes: Conjunctivae and EOM are normal. Pupils are equal, round, and reactive to light. Right eye exhibits no discharge. Left eye exhibits no discharge. No scleral icterus.  Neck: Normal range of motion. Neck supple. No JVD present. No thyromegaly present.  Cardiovascular: Normal rate,  regular rhythm, normal heart sounds and intact distal pulses.  Exam reveals no gallop and no friction rub.   No murmur heard. Pulmonary/Chest: Effort normal and breath sounds normal. No respiratory distress. She has no wheezes. She has no rales.  Abdominal: Soft. Bowel sounds are normal. She exhibits no distension and no mass. There is tenderness ( RLQ ttp, abd distention with guarding and tympanitic sounds to percussion).  No ttp in the L abdomen in the LQ, no peritoneal signs  Musculoskeletal: Normal range of motion. She exhibits no edema and no tenderness.  Lymphadenopathy:    She has no cervical adenopathy.  Neurological: She is alert. Coordination normal.  Skin: Skin is warm and dry. No rash noted. No erythema.  Psychiatric: She has a normal mood and affect. Her behavior is normal.    ED Course  Procedures (including critical care time)  Labs Reviewed  CBC WITH DIFFERENTIAL - Abnormal; Notable for the following:    WBC 15.7 (*)    Neutrophils Relative 84 (*)    Neutro Abs 13.1 (*)    All other components within normal limits  COMPREHENSIVE METABOLIC PANEL - Abnormal; Notable for the following:    Sodium 128 (*)    Chloride 92 (*)    Glucose, Bld 174 (*)    BUN 24 (*)    All other components within normal limits  URINALYSIS, ROUTINE W REFLEX MICROSCOPIC - Abnormal; Notable for the following:    APPearance CLOUDY (*)    Hgb urine dipstick MODERATE (*)    Ketones, ur 15 (*)    All other components within normal limits  LIPASE, BLOOD  URINE MICROSCOPIC-ADD ON   Ct Abdomen Pelvis W Contrast  10/30/2012  *RADIOLOGY REPORT*  Clinical Data: Abdominal pain and distension.  Right upper quadrant pain.  Nausea and vomiting.  White cell count 15.7.  CT ABDOMEN AND PELVIS WITH CONTRAST  Technique:  Multidetector CT imaging of the abdomen and pelvis was performed following the standard protocol during bolus administration of intravenous contrast.  Contrast: OMNIPAQUE IOHEXOL 300  MG/ML  SOLN  Comparison: None.  Findings: Dependent atelectasis in the lung bases.  Bilateral breast implants.  2.1 cm diameter peripherally enhancing lesion in the anterior segment right lobe of the liver consistent with cavernous hemangioma.  Additional scattered sub-centimeter hypodense nodules in the liver may represent  cysts or hemangiomas but are too small to characterize.  Surgical absence of the gallbladder.  The liver, pancreas, adrenal glands, kidneys, abdominal aorta, inferior vena cava, and retroperitoneal lymph nodes are unremarkable.  The stomach appears normal.  Stool filled colon without distension. Pelvic small bowel loops are fluid-filled and mildly distended with suggestion of mild wall thickening.  The ileum is decompressed. Zone of transition appears to be in the right lower quadrant. At the zone of transition, there is evidence of thick-walled bowel with enhancement.  Changes may represent inflammatory stricture. Consider inflammatory bowel disease or Crohn disease. Small amount of mesenteric fluid likely representing inflammatory reaction.  No free air or free fluid in the abdomen.  Pelvis:  The uterus appears to be surgically absent.  No abnormal adnexal masses.  The bladder wall is not thickened.  No diverticulitis.  There is a small amount of free fluid in the pelvis which may be inflammatory.  The appendix is not well demarcated. Degenerative changes in the lumbar spine.  IMPRESSION: Mildly dilated fluid-filled jejunum with decompressed ileum.  There are interloop fluid collections with enhanced wall thickening in the zone of transition suggesting inflammatory process such as Crohn's disease. 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   Original Report Authenticated By: Burman Nieves, M.D.    Dg Abd Acute W/chest  10/30/2012  *RADIOLOGY REPORT*  Clinical Data: Abdominal pain and distention.  Nausea and vomiting.  ACUTE ABDOMEN SERIES (ABDOMEN 2 VIEW & CHEST 1 VIEW)  Comparison: No prior  abdominal imaging.  Two-view chest x-ray 07/11/2011.  Findings: Long segment of mildly distended small bowel in the pelvis.  No small bowel distention elsewhere.  Large stool burden throughout normal caliber colon.  No evidence of free air on the lateral decubitus image.  Air-fluid level within the dilated loop of small bowel.  Surgical clips in the right upper quadrant from prior cholecystectomy.  No visible opaque urinary tract calculi. Degenerative changes involving the lower lumbar spine.  Cardiac silhouette upper normal in size, unchanged.  Emphysematous changes in the upper lobes, unchanged.  Lungs clear. Bronchovascular markings normal.  Pulmonary vascularity normal.  No pneumothorax.  No pleural effusions.  IMPRESSION:  1.  Long segment of mildly distended small bowel in the pelvis. This may be due to enteritis, a localized ileus, or may be a manifestation of an early partial small bowel obstruction.  Follow- up abdominal x-rays may be helpful to make this determination. 2.  No free intraperitoneal air. 3.  COPD/emphysema.  No acute cardiopulmonary disease.   Original Report Authenticated By: Hulan Saas, M.D.      1. Small bowel obstruction   2. Hyponatremia       MDM  POssible gastroenteritis - she does work for [employer] though in office setting and without known sick contacts.  R/o pancreatitis, acute hepatitis, possible PUD, unliekly obstruction.  Labs, acute abd series, symptomatic care with fluids and anti emetics.  Review of the laboratory data shows normal urinalysis, normal specific gravity, slight ketonuria. She has a slight hyponatremia, a leukocytosis of 15,700 and a normal lipase. Initially the x-ray showed some fluid-filled and air-fluid level bowel loops in the small bowel, a CT scan shows that she has a small bowel obstruction related to a stricture in her jejunum likely caused from inflammatory bowel. I have discussed her care with the hospitalist who will  admit her. I have ordered a nasogastric tube to be placed to wall suction, the patient appears hemodynamically stable at this time.  Vida Roller, MD 10/30/12 [phone]

## 2012-10-30 ENCOUNTER — Encounter (HOSPITAL_COMMUNITY): Payer: Self-pay | Admitting: *Deleted

## 2012-10-30 ENCOUNTER — Inpatient Hospital Stay (HOSPITAL_COMMUNITY): Payer: BC Managed Care – PPO

## 2012-10-30 ENCOUNTER — Emergency Department (HOSPITAL_COMMUNITY): Payer: BC Managed Care – PPO

## 2012-10-30 DIAGNOSIS — R109 Unspecified abdominal pain: Secondary | ICD-10-CM

## 2012-10-30 DIAGNOSIS — E871 Hypo-osmolality and hyponatremia: Secondary | ICD-10-CM | POA: Diagnosis present

## 2012-10-30 DIAGNOSIS — K56609 Unspecified intestinal obstruction, unspecified as to partial versus complete obstruction: Secondary | ICD-10-CM | POA: Diagnosis present

## 2012-10-30 LAB — URINALYSIS, ROUTINE W REFLEX MICROSCOPIC
Bilirubin Urine: NEGATIVE
Glucose, UA: NEGATIVE mg/dL
Ketones, ur: 15 mg/dL — AB
Leukocytes, UA: NEGATIVE
Nitrite: NEGATIVE
Protein, ur: NEGATIVE mg/dL
Specific Gravity, Urine: 1.027 (ref 1.005–1.030)
Urobilinogen, UA: 0.2 mg/dL (ref 0.0–1.0)
pH: 7 (ref 5.0–8.0)

## 2012-10-30 LAB — CBC WITH DIFFERENTIAL/PLATELET
Basophils Absolute: 0 10*3/uL (ref 0.0–0.1)
Basophils Absolute: 0 10*3/uL (ref 0.0–0.1)
Basophils Relative: 0 % (ref 0–1)
Basophils Relative: 0 % (ref 0–1)
Eosinophils Absolute: 0 10*3/uL (ref 0.0–0.7)
Eosinophils Absolute: 0.1 10*3/uL (ref 0.0–0.7)
Eosinophils Relative: 0 % (ref 0–5)
Eosinophils Relative: 0 % (ref 0–5)
HCT: 36.2 % (ref 36.0–46.0)
HCT: 38.7 % (ref 36.0–46.0)
Hemoglobin: 12.3 g/dL (ref 12.0–15.0)
Hemoglobin: 13.1 g/dL (ref 12.0–15.0)
Lymphocytes Relative: 10 % — ABNORMAL LOW (ref 12–46)
Lymphocytes Relative: 12 % (ref 12–46)
Lymphs Abs: 1 10*3/uL (ref 0.7–4.0)
Lymphs Abs: 1.8 10*3/uL (ref 0.7–4.0)
MCH: 28.4 pg (ref 26.0–34.0)
MCH: 28.6 pg (ref 26.0–34.0)
MCHC: 33.9 g/dL (ref 30.0–36.0)
MCHC: 34 g/dL (ref 30.0–36.0)
MCV: 83.9 fL (ref 78.0–100.0)
MCV: 84.2 fL (ref 78.0–100.0)
Monocytes Absolute: 0.3 10*3/uL (ref 0.1–1.0)
Monocytes Absolute: 0.6 10*3/uL (ref 0.1–1.0)
Monocytes Relative: 3 % (ref 3–12)
Monocytes Relative: 4 % (ref 3–12)
Neutro Abs: 13.1 10*3/uL — ABNORMAL HIGH (ref 1.7–7.7)
Neutro Abs: 8.5 10*3/uL — ABNORMAL HIGH (ref 1.7–7.7)
Neutrophils Relative %: 84 % — ABNORMAL HIGH (ref 43–77)
Neutrophils Relative %: 87 % — ABNORMAL HIGH (ref 43–77)
Platelets: 241 10*3/uL (ref 150–400)
Platelets: 242 10*3/uL (ref 150–400)
RBC: 4.3 MIL/uL (ref 3.87–5.11)
RBC: 4.61 MIL/uL (ref 3.87–5.11)
RDW: 12.7 % (ref 11.5–15.5)
RDW: 12.9 % (ref 11.5–15.5)
WBC: 15.7 10*3/uL — ABNORMAL HIGH (ref 4.0–10.5)
WBC: 9.8 10*3/uL (ref 4.0–10.5)

## 2012-10-30 LAB — COMPREHENSIVE METABOLIC PANEL
ALT: 24 U/L (ref 0–35)
AST: 20 U/L (ref 0–37)
Albumin: 3.7 g/dL (ref 3.5–5.2)
Alkaline Phosphatase: 52 U/L (ref 39–117)
BUN: 24 mg/dL — ABNORMAL HIGH (ref 6–23)
CO2: 24 mEq/L (ref 19–32)
Calcium: 9.5 mg/dL (ref 8.4–10.5)
Chloride: 92 mEq/L — ABNORMAL LOW (ref 96–112)
Creatinine, Ser: 0.62 mg/dL (ref 0.50–1.10)
GFR calc Af Amer: >90 mL/min (ref >90)
GFR calc non Af Amer: >90 mL/min (ref >90)
Glucose, Bld: 174 mg/dL — ABNORMAL HIGH (ref 70–99)
Potassium: 3.9 mEq/L (ref 3.5–5.1)
Sodium: 128 mEq/L — ABNORMAL LOW (ref 135–145)
Total Bilirubin: 0.4 mg/dL (ref 0.3–1.2)
Total Protein: 7.4 g/dL (ref 6.0–8.3)

## 2012-10-30 LAB — BASIC METABOLIC PANEL
BUN: 15 mg/dL (ref 6–23)
CO2: 25 mEq/L (ref 19–32)
Calcium: 8.4 mg/dL (ref 8.4–10.5)
Chloride: 97 mEq/L (ref 96–112)
Creatinine, Ser: 0.56 mg/dL (ref 0.50–1.10)
GFR calc Af Amer: >90 mL/min (ref >90)
GFR calc non Af Amer: >90 mL/min (ref >90)
Glucose, Bld: 119 mg/dL — ABNORMAL HIGH (ref 70–99)
Potassium: 4.3 mEq/L (ref 3.5–5.1)
Sodium: 130 mEq/L — ABNORMAL LOW (ref 135–145)

## 2012-10-30 LAB — URINE MICROSCOPIC-ADD ON

## 2012-10-30 LAB — HEPATIC FUNCTION PANEL
ALT: 19 U/L (ref 0–35)
AST: 13 U/L (ref 0–37)
Albumin: 3 g/dL — ABNORMAL LOW (ref 3.5–5.2)
Alkaline Phosphatase: 46 U/L (ref 39–117)
Bilirubin, Direct: <0.1 mg/dL (ref 0.0–0.3)
Total Bilirubin: 0.3 mg/dL (ref 0.3–1.2)
Total Protein: 6.3 g/dL (ref 6.0–8.3)

## 2012-10-30 LAB — LIPASE, BLOOD: Lipase: 41 U/L (ref 11–59)

## 2012-10-30 MED ORDER — FLUTICASONE PROPIONATE 50 MCG/ACT NA SUSP
1.0000 | Freq: Every day | NASAL | Status: DC | PRN
  Filled 2012-10-30: qty 16

## 2012-10-30 MED ORDER — LORAZEPAM 2 MG/ML IJ SOLN
0.2500 mg | Freq: Three times a day (TID) | INTRAMUSCULAR | Status: DC | PRN
  Administered 2012-10-30 – 2012-11-05 (×13): 0.25 mg via INTRAVENOUS
  Filled 2012-10-30 (×13): qty 1

## 2012-10-30 MED ORDER — HYDROMORPHONE HCL PF 1 MG/ML IJ SOLN
1.0000 mg | Freq: Once | INTRAMUSCULAR | Status: AC
  Administered 2012-10-30: 1 mg via INTRAVENOUS
  Filled 2012-10-30: qty 1

## 2012-10-30 MED ORDER — ONDANSETRON HCL 4 MG/2ML IJ SOLN
4.0000 mg | Freq: Four times a day (QID) | INTRAMUSCULAR | Status: DC | PRN
  Administered 2012-10-30 – 2012-11-01 (×7): 4 mg via INTRAVENOUS
  Filled 2012-10-30 (×7): qty 2

## 2012-10-30 MED ORDER — MORPHINE SULFATE 4 MG/ML IJ SOLN
4.0000 mg | Freq: Once | INTRAMUSCULAR | Status: AC
  Administered 2012-10-30: 4 mg via INTRAVENOUS
  Filled 2012-10-30: qty 1

## 2012-10-30 MED ORDER — ACETAMINOPHEN 650 MG RE SUPP: 650.0000 mg | Freq: Four times a day (QID) | RECTAL | Status: DC | PRN

## 2012-10-30 MED ORDER — IOHEXOL 300 MG/ML  SOLN
50.0000 mL | Freq: Once | INTRAMUSCULAR | Status: AC | PRN
  Administered 2012-10-30: 50 mL via ORAL

## 2012-10-30 MED ORDER — PROMETHAZINE HCL 25 MG/ML IJ SOLN
6.2500 mg | Freq: Four times a day (QID) | INTRAMUSCULAR | Status: DC | PRN
  Administered 2012-10-30: 6.25 mg via INTRAVENOUS
  Administered 2012-10-31 – 2012-11-01 (×2): 12.5 mg via INTRAVENOUS
  Filled 2012-10-30 (×3): qty 1

## 2012-10-30 MED ORDER — DEXTROSE 5 % IV SOLN
200.0000 mg | Freq: Every day | INTRAVENOUS | Status: AC
  Administered 2012-10-30 – 2012-11-01 (×3): 200 mg via INTRAVENOUS
  Filled 2012-10-30 (×3): qty 4

## 2012-10-30 MED ORDER — ONDANSETRON HCL 4 MG PO TABS: 4.0000 mg | ORAL_TABLET | Freq: Four times a day (QID) | ORAL | Status: DC | PRN

## 2012-10-30 MED ORDER — HYDROMORPHONE HCL PF 1 MG/ML IJ SOLN
0.5000 mg | INTRAMUSCULAR | Status: DC | PRN
  Administered 2012-10-30 – 2012-11-01 (×7): 0.5 mg via INTRAVENOUS
  Filled 2012-10-30 (×7): qty 1

## 2012-10-30 MED ORDER — ACETAMINOPHEN 325 MG PO TABS: 650.0000 mg | ORAL_TABLET | Freq: Four times a day (QID) | ORAL | Status: DC | PRN

## 2012-10-30 MED ORDER — IOHEXOL 300 MG/ML  SOLN
100.0000 mL | Freq: Once | INTRAMUSCULAR | Status: AC | PRN
  Administered 2012-10-30: 100 mL via INTRAVENOUS

## 2012-10-30 MED ORDER — SODIUM CHLORIDE 0.9 % IV SOLN
INTRAVENOUS | Status: DC
  Administered 2012-10-30 (×2): via INTRAVENOUS

## 2012-10-30 MED ORDER — METHYLPREDNISOLONE SODIUM SUCC 125 MG IJ SOLR
80.0000 mg | Freq: Two times a day (BID) | INTRAMUSCULAR | Status: DC
  Administered 2012-10-30 – 2012-11-03 (×10): 80 mg via INTRAVENOUS
  Filled 2012-10-30 (×12): qty 1.28

## 2012-10-30 MED ORDER — ONDANSETRON HCL 4 MG/2ML IJ SOLN
4.0000 mg | Freq: Once | INTRAMUSCULAR | Status: AC
  Administered 2012-10-30: 4 mg via INTRAVENOUS
  Filled 2012-10-30: qty 2

## 2012-10-30 MED ORDER — PROMETHAZINE HCL 25 MG/ML IJ SOLN
25.0000 mg | Freq: Once | INTRAMUSCULAR | Status: AC
  Administered 2012-10-30: 25 mg via INTRAVENOUS
  Filled 2012-10-30: qty 1

## 2012-10-30 MED ORDER — HYDROMORPHONE HCL PF 1 MG/ML IJ SOLN: 1.0000 mg | INTRAMUSCULAR | Status: AC | PRN

## 2012-10-30 NOTE — ED Notes (Signed)
Pt in ct 

## 2012-10-30 NOTE — Consult Note (Signed)
General Surgery [facility]  Co-sign addendum.  [name], MD, [facility] Office: [phone]

## 2012-10-30 NOTE — Consult Note (Addendum)
Reason for Consult:SBO Referring Physician: [name] is an 67 y.o. female.  HPI: Patient is a Haley Wade who had problems with abdominal pain in December and January for approximately 6 weeks. She underwent EGD and colonoscopy at [facility] and recovered from the symptoms and did well. She was also seen last week by her doctor in more is will we treated her for bladder pain and urethral pain for a UTI. Her culture came back today negative but she has been on Cipro. She reports she was fine over the weekend and Monday. Yesterday around 6 PM she has some pain in her right side going to the left then developed nausea and vomiting. She's had continued nausea/vomiting and presented to the emergency room where she was admitted by the hospitalist service. Labs on admission shows a sodium of 128, potassium 3.9, chloride 92, CO2 of 24 BUN of 24 creatinine 0.62 glucose was elevated LFTs were normal lipase was 41. White count 14,700 hemoglobin and hematocrit platelets were normal.` Acute abdominal series showed a long segment distended bowel in the pelvis with a differential of enteritis versus localized ileus. A CT scan was then obtained which showed some dilated fluid-filled loops of jejunum with decompressed ileum. There was an interloop fluid collections with enhancing thickening suggestive of an inflammatory process such as Crohn's. Were asked to the patient in consultation. She has a prior history of cholecystectomy and hysterectomy.  Past Medical History  Diagnosis Date  . Allergic rhinitis, cause unspecified   . Other chronic sinusitis     history of pericarditis 1990      history of Mnire's disease and Vertigo     history of GERD      history of bladder surgery 2003 and bladder repair 2011      status post hysterectomy 1981 and cholecystectomy 1998      cystitis      history degenerative disc disease with C2-3 and L4 procedures in the past.    . Personal history  of unspecified circulatory disease Hx of HSV     Past Surgical History  Procedure Laterality Date  . Rhinoplasty    . Total abdominal hysterectomy    . Tonsillectomy    . Breast enhancement surgery      revisions  . Bladder surgery Cholecystectomy 1990, hysterectomy 1981       Family History  Problem Relation Age of Onset  . Other Mother     c difficile gastroenteritis  . Lung cancer Father     smoker  . Liver cancer Brother   . Asthma      grandmother  . Other      bronchitis-grandmother  . Liver disease Brother     Social History:  reports that she quit smoking about 32 years ago. Her smoking use included Cigarettes. She smoked 0.00 packs per day. She does not have any smokeless tobacco history on file. She reports that she does not drink alcohol or use illicit drugs.  Allergies:  Allergies  Allergen Reactions  . Bee Venom Swelling    Medications:  Prior to Admission:  Prescriptions prior to admission  Medication Sig Dispense Refill  . conjugated estrogens (PREMARIN) vaginal cream Place 0.5 g vaginally 2 (two) times a week. Patient states that uses this just whenever she remembers during the week no specific days to list.      . fluticasone (FLONASE) 50 MCG/ACT nasal spray Place 2 sprays into the nose daily as needed for  rhinitis. For nasal drainage      . LORazepam (ATIVAN) 0.5 MG tablet Take 0.25-0.5 tablets by mouth every 8 (eight) hours as needed. Take 0.5 tablet twice a day Take 1 tablet at bedtime      . PREMARIN 0.625 MG tablet Take 1 tablet by mouth Daily.      Marland Kitchen spironolactone (ALDACTONE) 100 MG tablet Take 0.5 tablets by mouth 3 (three) times daily.       . tamsulosin (FLOMAX) 0.4 MG CAPS Take 0.4 mg by mouth daily.      Marland Kitchen ZOVIRAX 200 MG capsule Take 1 capsule by mouth daily.      Marland Kitchen EPINEPHrine (EPIPEN) 0.3 mg/0.3 mL DEVI Inject 0.3 mLs (0.3 mg total) into the muscle once. Use as directed  1 Device  prn   Scheduled: . acyclovir  200 mg Intravenous Daily   . methylPREDNISolone (SOLU-MEDROL) injection  80 mg Intravenous Q12H   Continuous: . sodium chloride 75 mL/hr at 10/30/12 4098   JXB:[id number], acetaminophen, fluticasone, HYDROmorphone (DILAUDID) injection, HYDROmorphone (DILAUDID) injection, LORazepam, ondansetron (ZOFRAN) IV, ondansetron, promethazine Anti-infectives   Start     Dose/Rate Route Frequency Ordered Stop   10/30/12 1000  acyclovir (ZOVIRAX) 200 mg in dextrose 5 % 100 mL IVPB     200 mg 104 mL/hr over 60 Minutes Intravenous Daily 10/30/12 0602        Results for orders placed during the hospital encounter of 10/29/12 (from the past 48 hour(s))  CBC WITH DIFFERENTIAL     Status: Abnormal   Collection Time    10/29/12 11:45 PM      Result Value Range   WBC 15.7 (*) 4.0 - 10.5 K/uL   RBC 4.61  3.87 - 5.11 MIL/uL   Hemoglobin 13.1  12.0 - 15.0 g/dL   HCT 65.7  84.6 - 96.2 %   MCV 83.9  78.0 - 100.0 fL   MCH 28.4  26.0 - 34.0 pg   MCHC 33.9  30.0 - 36.0 g/dL   RDW 95.2  84.1 - 32.4 %   Platelets 242  150 - 400 K/uL   Neutrophils Relative 84 (*) 43 - 77 %   Neutro Abs 13.1 (*) 1.7 - 7.7 K/uL   Lymphocytes Relative 12  12 - 46 %   Lymphs Abs 1.8  0.7 - 4.0 K/uL   Monocytes Relative 4  3 - 12 %   Monocytes Absolute 0.6  0.1 - 1.0 K/uL   Eosinophils Relative 0  0 - 5 %   Eosinophils Absolute 0.1  0.0 - 0.7 K/uL   Basophils Relative 0  0 - 1 %   Basophils Absolute 0.0  0.0 - 0.1 K/uL  COMPREHENSIVE METABOLIC PANEL     Status: Abnormal   Collection Time    10/29/12 11:45 PM      Result Value Range   Sodium 128 (*) 135 - 145 mEq/L   Potassium 3.9  3.5 - 5.1 mEq/L   Chloride 92 (*) 96 - 112 mEq/L   CO2 24  19 - 32 mEq/L   Glucose, Bld 174 (*) 70 - 99 mg/dL   BUN 24 (*) 6 - 23 mg/dL   Creatinine, Ser 4.01  0.50 - 1.10 mg/dL   Calcium 9.5  8.4 - 02.7 mg/dL   Total Protein 7.4  6.0 - 8.3 g/dL   Albumin 3.7  3.5 - 5.2 g/dL   AST 20  0 - 37 U/L   ALT 24  0 - 35 U/L  Alkaline Phosphatase 52  39 - 117 U/L    Total Bilirubin 0.4  0.3 - 1.2 mg/dL   GFR calc non Af Amer >90  >90 mL/min   GFR calc Af Amer >90  >90 mL/min   Comment:            The eGFR has been calculated     using the CKD EPI equation.     This calculation has not been     validated in all clinical     situations.     eGFR's persistently     <90 mL/min signify     possible Chronic Kidney Disease.  LIPASE, BLOOD     Status: None   Collection Time    10/29/12 11:45 PM      Result Value Range   Lipase 41  11 - 59 U/L  URINALYSIS, ROUTINE W REFLEX MICROSCOPIC     Status: Abnormal   Collection Time    10/30/12  4:00 AM      Result Value Range   Color, Urine YELLOW  YELLOW   APPearance CLOUDY (*) CLEAR   Specific Gravity, Urine 1.027  1.005 - 1.030   pH 7.0  5.0 - 8.0   Glucose, UA NEGATIVE  NEGATIVE mg/dL   Hgb urine dipstick MODERATE (*) NEGATIVE   Bilirubin Urine NEGATIVE  NEGATIVE   Ketones, ur 15 (*) NEGATIVE mg/dL   Protein, ur NEGATIVE  NEGATIVE mg/dL   Urobilinogen, UA 0.2  0.0 - 1.0 mg/dL   Nitrite NEGATIVE  NEGATIVE   Leukocytes, UA NEGATIVE  NEGATIVE  URINE MICROSCOPIC-ADD ON     Status: None   Collection Time    10/30/12  4:00 AM      Result Value Range   Squamous Epithelial / LPF RARE  RARE   WBC, UA 0-2  <3 WBC/hpf   RBC / HPF 0-2  <3 RBC/hpf   Bacteria, UA RARE  RARE  BASIC METABOLIC PANEL     Status: Abnormal   Collection Time    10/30/12  6:50 AM      Result Value Range   Sodium 130 (*) 135 - 145 mEq/L   Potassium 4.3  3.5 - 5.1 mEq/L   Chloride Haley  96 - 112 mEq/L   CO2 25  19 - 32 mEq/L   Glucose, Bld 119 (*) 70 - 99 mg/dL   BUN 15  6 - 23 mg/dL   Creatinine, Ser 1.61  0.50 - 1.10 mg/dL   Calcium 8.4  8.4 - 09.6 mg/dL   GFR calc non Af Amer >90  >90 mL/min   GFR calc Af Amer >90  >90 mL/min   Comment:            The eGFR has been calculated     using the CKD EPI equation.     This calculation has not been     validated in all clinical     situations.     eGFR's persistently     <90  mL/min signify     possible Chronic Kidney Disease.  HEPATIC FUNCTION PANEL     Status: Abnormal   Collection Time    10/30/12  6:50 AM      Result Value Range   Total Protein 6.3  6.0 - 8.3 g/dL   Albumin 3.0 (*) 3.5 - 5.2 g/dL   AST 13  0 - 37 U/L   ALT 19  0 - 35 U/L   Alkaline Phosphatase 46  39 - 117 U/L   Total Bilirubin 0.3  0.3 - 1.2 mg/dL   Bilirubin, Direct <4.0  0.0 - 0.3 mg/dL   Indirect Bilirubin NOT CALCULATED  0.3 - 0.9 mg/dL  CBC WITH DIFFERENTIAL     Status: Abnormal   Collection Time    10/30/12  6:50 AM      Result Value Range   WBC 9.8  4.0 - 10.5 K/uL   RBC 4.30  3.87 - 5.11 MIL/uL   Hemoglobin 12.3  12.0 - 15.0 g/dL   HCT 98.1  19.1 - 47.8 %   MCV 84.2  78.0 - 100.0 fL   MCH 28.6  26.0 - 34.0 pg   MCHC 34.0  30.0 - 36.0 g/dL   RDW 29.5  62.1 - 30.8 %   Platelets 241  150 - 400 K/uL   Neutrophils Relative 87 (*) 43 - 77 %   Neutro Abs 8.5 (*) 1.7 - 7.7 K/uL   Lymphocytes Relative 10 (*) 12 - 46 %   Lymphs Abs 1.0  0.7 - 4.0 K/uL   Monocytes Relative 3  3 - 12 %   Monocytes Absolute 0.3  0.1 - 1.0 K/uL   Eosinophils Relative 0  0 - 5 %   Eosinophils Absolute 0.0  0.0 - 0.7 K/uL   Basophils Relative 0  0 - 1 %   Basophils Absolute 0.0  0.0 - 0.1 K/uL    Dg Abd 1 View  10/30/2012  *RADIOLOGY REPORT*  Clinical Data: Partial small bowel obstruction.  Vomiting. Abdominal pain and nausea.  ABDOMEN - 1 VIEW  Comparison: CT scan dated 10/02/2012 and radiographs dated 10/01/2012  Findings: NG tube in the fundus of the stomach.  There are no dilated loops of large or small bowel.  There is stool scattered throughout the nondistended colon.  The bladder is filled with contrast.  There appears to be a cystocele.  IMPRESSION: No visible dilated bile loops.  Cystocele.   Original Report Authenticated By: Francene Boyers, M.D.    Ct Abdomen Pelvis W Contrast  10/30/2012  *RADIOLOGY REPORT*  Clinical Data: Abdominal pain and distension.  Right upper quadrant pain.  Nausea  and vomiting.  White cell count 15.7.  CT ABDOMEN AND PELVIS WITH CONTRAST  Technique:  Multidetector CT imaging of the abdomen and pelvis was performed following the standard protocol during bolus administration of intravenous contrast.  Contrast: OMNIPAQUE IOHEXOL 300 MG/ML  SOLN  Comparison: None.  Findings: Dependent atelectasis in the lung bases.  Bilateral breast implants.  2.1 cm diameter peripherally enhancing lesion in the anterior segment right lobe of the liver consistent with cavernous hemangioma.  Additional scattered sub-centimeter hypodense nodules in the liver may represent cysts or hemangiomas but are too small to characterize.  Surgical absence of the gallbladder.  The liver, pancreas, adrenal glands, kidneys, abdominal aorta, inferior vena cava, and retroperitoneal lymph nodes are unremarkable.  The stomach appears normal.  Stool filled colon without distension. Pelvic small bowel loops are fluid-filled and mildly distended with suggestion of mild wall thickening.  The ileum is decompressed. Zone of transition appears to be in the right lower quadrant. At the zone of transition, there is evidence of thick-walled bowel with enhancement.  Changes may represent inflammatory stricture. Consider inflammatory bowel disease or Crohn disease. Small amount of mesenteric fluid likely representing inflammatory reaction.  No free air or free fluid in the abdomen.  Pelvis:  The uterus appears to be surgically absent.  No abnormal  adnexal masses.  The bladder wall is not thickened.  No diverticulitis.  There is a small amount of free fluid in the pelvis which may be inflammatory.  The appendix is not well demarcated. Degenerative changes in the lumbar spine.  IMPRESSION: Mildly dilated fluid-filled jejunum with decompressed ileum.  There are interloop fluid collections with enhanced wall thickening in the zone of transition suggesting inflammatory process such as Crohn's disease. Cavernous hemangioma in  the liver.   Original Report Authenticated By: Burman Nieves, M.D.    Dg Abd Acute W/chest  10/30/2012  *RADIOLOGY REPORT*  Clinical Data: Abdominal pain and distention.  Nausea and vomiting.  ACUTE ABDOMEN SERIES (ABDOMEN 2 VIEW & CHEST 1 VIEW)  Comparison: No prior abdominal imaging.  Two-view chest x-ray 07/11/2011.  Findings: Long segment of mildly distended small bowel in the pelvis.  No small bowel distention elsewhere.  Large stool burden throughout normal caliber colon.  No evidence of free air on the lateral decubitus image.  Air-fluid level within the dilated loop of small bowel.  Surgical clips in the right upper quadrant from prior cholecystectomy.  No visible opaque urinary tract calculi. Degenerative changes involving the lower lumbar spine.  Cardiac silhouette upper normal in size, unchanged.  Emphysematous changes in the upper lobes, unchanged.  Lungs clear. Bronchovascular markings normal.  Pulmonary vascularity normal.  No pneumothorax.  No pleural effusions.  IMPRESSION:  1.  Long segment of mildly distended small bowel in the pelvis. This may be due to enteritis, a localized ileus, or may be a manifestation of an early partial small bowel obstruction.  Follow- up abdominal x-rays may be helpful to make this determination. 2.  No free intraperitoneal air. 3.  COPD/emphysema.  No acute cardiopulmonary disease.   Original Report Authenticated By: Hulan Saas, M.D.     Review of Systems  Constitutional: Negative.   HENT: Positive for hearing loss (she has a hearing aide.).   Eyes: Negative.   Respiratory: Negative.   Cardiovascular: Positive for chest pain (occasional chest pain nothing recently, she attributes to her MR).  Gastrointestinal: Positive for heartburn (rare), nausea, vomiting, abdominal pain (med abdomen around the umbilicus) and constipation. Negative for diarrhea, blood in stool and melena.  Genitourinary:       UTI RX last week, for bladder pain and urethral pain.  Culture is now back and negative.   Musculoskeletal: Negative.   Skin: Positive for rash (left side,but it's better.).  Neurological: Negative.   Endo/Heme/Allergies: Negative.   Psychiatric/Behavioral: The patient is nervous/anxious.    Blood pressure 141/58, pulse 86, temperature 98.5 F (36.9 C), temperature source Oral, resp. rate 16, height 5' (1.524 m), weight 101 lb 3.2 oz (45.904 kg), SpO2 100.00%. Physical Exam  Constitutional: She is oriented to person, place, and time.  Thin WF, no acute distress. NG in but not working, I have fixed it.  Ongoing nausea, more like dry heaves.  HENT:  Head: Normocephalic and atraumatic.  Nose: Nose normal.  Mouth/Throat: Oropharynx is clear and moist.  Eyes: Conjunctivae and EOM are normal. Pupils are equal, round, and reactive to light. Right eye exhibits no discharge. No scleral icterus.  Neck: Normal range of motion. Neck supple. No JVD present. No tracheal deviation present. No thyromegaly present.  Cardiovascular: Regular rhythm and intact distal pulses.  Exam reveals no gallop.   No murmur heard. Irregular rhythm extra systoles, and split S2  Respiratory: Effort normal and breath sounds normal. No respiratory distress. She has no wheezes. She has  no rales. She exhibits no tenderness.  GI: Soft. Bowel sounds are normal.  Musculoskeletal: She exhibits no edema and no tenderness.  Lymphadenopathy:    She has no cervical adenopathy.  Neurological: She is alert and oriented to person, place, and time. No cranial nerve deficit.  Skin: Skin is warm and dry. Rash (mild rash left side.) noted. No erythema.  Psychiatric: She has a normal mood and affect. Her behavior is normal. Judgment and thought content normal.    Assessment/Plan: 1. Partial small bowel obstruction; enteritis versus mechanical. 2. Cystocele with prior bladder surgery and bladder repairs. 3. History of pericarditis. 4. History of Mnire's and vertigo 5. Mild MR 6.  Degenerative disc disease with prior surgeries 7. GERD 8. History of anxiety  Plan: Recheck her labs and films in the morning and follow with you continue NG decompression. Currently that does not appear to be helping her. I have addded some Phenergan for her nausea and dry heaves. She does not appear to have an acute surgical abdomen at this time.  We will continue to follow with you.  [facility] physician assistant for Dr. Darnell Level.  Anah Billard 10/30/2012, 2:56 PM

## 2012-10-30 NOTE — Care Management Note (Signed)
CARE MANAGEMENT NOTE 10/30/2012  Patient:  Haley Wade, Haley Wade   Account Number:  [id number]  Date Initiated:  10/30/2012  Documentation initiated by:  Vaughan Garfinkle  Subjective/Objective Assessment:   67 yo female admitted with short bowel obstruction possible crohns disease.  PCP: Dorothey Baseman, MD     Action/Plan:   Home when stable   Anticipated DC Date:     Anticipated DC Plan:        DC Planning Services  CM consult      Choice offered to / List presented to:             Status of service:  In process, will continue to follow Medicare Important Message given?   (If response is "NO", the following Medicare IM given date fields will be blank) Date Medicare IM given:   Date Additional Medicare IM given:    Discharge Disposition:    Per UR Regulation:  Reviewed for med. necessity/level of care/duration of stay  If discussed at Long Length of Stay Meetings, dates discussed:    Comments:  10/30/12 1450 Haley Melcher,RN,BSN [phone]

## 2012-10-30 NOTE — ED Notes (Signed)
Pt reports n/v that began yesterday evening approx 1800, denies any episodes of diarrhea - pt states she is currently being treated for possible UTI w/ cipro - pts urologist called today reporting her urine culture was negative and she may stop the cipro. Pt states she was eating chicken soup and had a small amount of wine tonight prior to acute onset of RUQ pain. Pain now radiating across the top of pt's abd.

## 2012-10-30 NOTE — ED Notes (Signed)
Patient transported to X-ray 

## 2012-10-30 NOTE — Progress Notes (Signed)
Subjective: Pt admitted last night by the hospitalists for SBO with story c/w n/v for 24 hrs, denies any episodes of diarrhea - pt states she is currently being treated for possible UTI w/ cipro - pts urologist called her reporting her urine culture was negative and she may stop the cipro. Pt states she was eating chicken soup and had a small amount of wine prior to acute onset of RUQ pain. Pain radiates across the top of pt's abd. W/Up in ED revealed Hyponatrmia, Leukocytosis, Ab Pain, and a normal lipase. X-ray showed some fluid-filled and air-fluid level bowel loops in the small bowel, a CT scan shows that she has a small bowel obstruction related to a stricture in her jejunum likely caused from inflammatory bowel?--?Crohns Dz? vrs Adhesions. Nasogastric placed to wall suction.  She is better than a few hrs ago but still with Nausea and pain.   Date of Last Colonoscopy:  08/30/12 [facility] surgical    Ms Caryl Comes is a healthy 67 yo F who resides in [place] but works in [place] and established with me in January. Had Colon/Endo1/2014 - 1 polyp and Neg Stomach Bx.  Has been having Ab pain for 6-7 m and difficulty with Bowels and gas. She is thin and has been having trouble maintaining weight.   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/H/O L4 laminectomy S/P ESI,  Polypectomy - 08/2012, Incomplete Emptying S/P Sling and maintains on Flomax with occ Cystitis - treated with Cipro, Anxiety Vitreus Gel Break and Dry Eye Syndrome - Dr Charlotte Sanes  Other MDs: Dr.Allen-PCP, Dr.Robert Evans-URO, Dr.Love-Neuro, Dr.Hank Smith-CARDS, Dr.George Hanna-DDS  Surgical History: Lumbar Disc surgery 1972 Cholecystectomy 1998 hysterectomy 1981 breast implants 1975 removal 2002 replaced again 2006 bladder surgery 2003 bladder repaired 2011  Objective: Vital signs in last 24 hours: Temp:  [97.6 F (36.4  C)-98.5 F (36.9 C)] 98.5 F (36.9 C) (03/19 0630) Pulse Rate:  [79-102] 86 (03/19 0630) Resp:  [16-20] 16 (03/19 0630) BP: (126-155)/(55-84) 141/58 mmHg (03/19 0630) SpO2:  [97 %-100 %] 100 % (03/19 0630) Weight:  [45.904 kg (101 lb 3.2 oz)] 45.904 kg (101 lb 3.2 oz) (03/19 0630) Weight change:     CBG (last 3)  No results found for this basename: GLUCAP,  in the last 72 hours  Intake/Output from previous day: No intake or output data in the 24 hours ending 10/30/12 0757     Physical Exam  General appearance: A and O Eyes: no scleral icterus Nare - NGT in place. Throat: Dry Resp: CTA B Cardio: Reg min m GI: Bloated, No BS, mild Tender - worse RUQ. Extremities: no clubbing, cyanosis or edema   Lab Results:  Recent Labs  10/29/12 2345 10/30/12 0650  NA 128* 130*  K 3.9 4.3  CL 92* 97  CO2 24 25  GLUCOSE 174* 119*  BUN 24* 15  CREATININE 0.62 0.56  CALCIUM 9.5 8.4     Recent Labs  10/29/12 2345 10/30/12 0650  AST 20 13  ALT 24 19  ALKPHOS 52 46  BILITOT 0.4 0.3  PROT 7.4 6.3  ALBUMIN 3.7 3.0*     Recent Labs  10/29/12 2345 10/30/12 0650  WBC 15.7* 9.8  NEUTROABS 13.1* 8.5*  HGB 13.1 12.3  HCT 38.7 36.2  MCV 83.9 84.2  PLT 242 241    No results found for this basename: INR,  PROTIME    No results found for this basename: CKTOTAL, CKMB, CKMBINDEX, TROPONINI,  in the last 72 hours  No results found for this basename: TSH, T4TOTAL, FREET3, T3FREE, THYROIDAB,  in the last 72 hours  No results found for this basename: VITAMINB12, FOLATE, FERRITIN, TIBC, IRON, RETICCTPCT,  in the last 72 hours  Micro Results: No results found for this or any previous visit (from the past 240 hour(s)).   Studies/Results: Ct Abdomen Pelvis W Contrast  10/30/2012  *RADIOLOGY REPORT*  Clinical Data: Abdominal pain and distension.  Right upper quadrant pain.  Nausea and vomiting.  White cell count 15.7.  CT ABDOMEN AND PELVIS WITH CONTRAST  Technique:   Multidetector CT imaging of the abdomen and pelvis was performed following the standard protocol during bolus administration of intravenous contrast.  Contrast: OMNIPAQUE IOHEXOL 300 MG/ML  SOLN  Comparison: None.  Findings: Dependent atelectasis in the lung bases.  Bilateral breast implants.  2.1 cm diameter peripherally enhancing lesion in the anterior segment right lobe of the liver consistent with cavernous hemangioma.  Additional scattered sub-centimeter hypodense nodules in the liver may represent cysts or hemangiomas but are too small to characterize.  Surgical absence of the gallbladder.  The liver, pancreas, adrenal glands, kidneys, abdominal aorta, inferior vena cava, and retroperitoneal lymph nodes are unremarkable.  The stomach appears normal.  Stool filled colon without distension. Pelvic small bowel loops are fluid-filled and mildly distended with suggestion of mild wall thickening.  The ileum is decompressed. Zone of transition appears to be in the right lower quadrant. At the zone of transition, there is evidence of thick-walled bowel with enhancement.  Changes may represent inflammatory stricture. Consider inflammatory bowel disease or Crohn disease. Small amount of mesenteric fluid likely representing inflammatory reaction.  No free air or free fluid in the abdomen.  Pelvis:  The uterus appears to be surgically absent.  No abnormal adnexal masses.  The bladder wall is not thickened.  No diverticulitis.  There is a small amount of free fluid in the pelvis which may be inflammatory.  The appendix is not well demarcated. Degenerative changes in the lumbar spine.  IMPRESSION: Mildly dilated fluid-filled jejunum with decompressed ileum.  There are interloop fluid collections with enhanced wall thickening in the zone of transition suggesting inflammatory process such as Crohn's disease. 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   Original Report Authenticated By: Burman Nieves, M.D.    Dg Abd Acute  W/chest  10/30/2012  *RADIOLOGY REPORT*  Clinical Data: Abdominal pain and distention.  Nausea and vomiting.  ACUTE ABDOMEN SERIES (ABDOMEN 2 VIEW & CHEST 1 VIEW)  Comparison: No prior abdominal imaging.  Two-view chest x-ray 07/11/2011.  Findings: Long segment of mildly distended small bowel in the pelvis.  No small bowel distention elsewhere.  Large stool burden throughout normal caliber colon.  No evidence of free air on the lateral decubitus image.  Air-fluid level within the dilated loop of small bowel.  Surgical clips in the right upper quadrant from prior cholecystectomy.  No visible opaque urinary tract calculi. Degenerative changes involving the lower lumbar spine.  Cardiac silhouette upper normal in size, unchanged.  Emphysematous changes in the upper lobes, unchanged.  Lungs clear. Bronchovascular markings normal.  Pulmonary vascularity normal.  No pneumothorax.  No pleural effusions.  IMPRESSION:  1.  Long segment of mildly distended small bowel in the pelvis. This may be due to enteritis, a localized ileus, or may be a manifestation of an early partial small bowel obstruction.  Follow- up abdominal x-rays may be helpful to make this determination. 2.  No free intraperitoneal air. 3.  COPD/emphysema.  No acute cardiopulmonary disease.   Original Report Authenticated By: Hulan Saas, M.D.      Medications: Scheduled: . acyclovir  200 mg Intravenous Daily   Continuous: . sodium chloride       Assessment/Plan: Principal Problem:   SBO (small bowel obstruction) Active Problems:   RHINOSINUSITIS, CHRONIC   Hyponatremia   1. Small bowel obstruction - transition point is at the jejunum. Concerning for possible inflammatory process like Crohn's disease but more probable is adhesions from prior surgeries. Surgery - Dr. Unice Cobble on board. Will hold on GI consult unless surgery wants me to call them.  Continue n.p.o. with continuous NG tube suction. Pain relief medications,  Antiemetics, and IV hydration.  She had GI W/Up in Jan at [facility] 2. Hyponatremia - probably from dehydration and spironolactone. Continue with IV fluids gently and follow metabolic panel. 130 this am. Since patient is dehydrated discontinue spironolactone for now. 3. History of herpes genitalis -  Zovirax IV. 4. History of anxiety -benzodiazepine to = 0.25 mg Ativan IV Q8 when necessary for anxiety. Usually uses 0.5 2 tabs daily and 1 HS. 5. History of allergic rhinitis - continue Flonase. 6. Leukocytosis probably reactionary - follow CBC. Better this am. 7. History of possible COPD - not wheezing. 8. Moderate MR - Released per Dr Garnette Scheuermann 9. Meniere's - NTD - Hold the Aldactone. 10.  Mult UTIs per Dr Logan Bores her Uro. On Flomax per him  ID -  Anti-infectives   Start     Dose/Rate Route Frequency Ordered Stop   10/30/12 1000  acyclovir (ZOVIRAX) 200 mg in dextrose 5 % 100 mL IVPB     200 mg 104 mL/hr over 60 Minutes Intravenous Daily 10/30/12 0602       DVT Prophylaxis    LOS: 1 day   Mataeo Ingwersen M 10/30/2012, 7:57 AM

## 2012-10-30 NOTE — Consult Note (Signed)
General Surgery [facility]  Patient seen and examined.  Records reviewed.  Studies reviewed.  Discussed briefly this morning with Dr. Timothy Lasso.  Abdomen is mildly diffusely tender, not acute.  Patient with persistent nausea and emesis despite Rx, NG tube, and no further output.  AXR and CT show inflammatory changes in small intestine but no definite point of obstruction.  Previous gynecologic and urologic procedures, no GI surgery in past.  No history of obstruction.  At present, will manage symptomatically.  May be enteritis, doubt high grade obstruction at present.  Will repeat AXR in AM.  Will follow closely with you.  [name], MD, [facility] Office: [phone]

## 2012-10-30 NOTE — H&P (Signed)
[facility] History and Physical  Haley Wade AOZ:[id number] DOB: August 02, 1946 DOA: 10/29/2012  Referring physician: Dr. Hyacinth Meeker. PCP: Dorothey Baseman, MD Dr. Timothy Lasso. Specialists: Dr. Jetty Duhamel.  Chief Complaint: Abdominal pain with nausea and vomiting.  HPI: Haley Wade is a 67 y.o. female presented with complaints of sudden onset of abdominal pain last evening with multiple episodes of nausea and vomiting. Patient was recently diagnosed with UTI and was placed on Cipro. Patient started developing abdominal pain in the right upper quadrant which started to radiate across the upper abdomen with subsequently multiple episodes of nausea vomiting. Her last bowel movement was yesterday morning which was normal. Denies any fever chills. In the ER CT abdomen and pelvis show small bowel obstruction with transition point in the jejunal area concerning for inflammatory process like Crohn's disease. Patient at this time has been placed on NG tube suction pain medication and IV fluids and admitted for further management. Patient otherwise denies any chest pain or shortness of breath.  Review of Systems: As presented in the history of presenting illness nothing else significant.  Past Medical History  Diagnosis Date  . Allergic rhinitis, cause unspecified   . Other chronic sinusitis   . Personal history of unspecified circulatory disease    Past Surgical History  Procedure Laterality Date  . Rhinoplasty    . Total abdominal hysterectomy    . Tonsillectomy    . Breast enhancement surgery      revisions  . Bladder surgery     Social History:  reports that she quit smoking about 32 years ago. Her smoking use included Cigarettes. She smoked 0.00 packs per day. She does not have any smokeless tobacco history on file. She reports that she does not drink alcohol or use illicit drugs. Lives at home. where does patient live-- Can do ADLs. Can patient participate in ADLs?  Allergies   Allergen Reactions  . Bee Venom Swelling    Family History  Problem Relation Age of Onset  . Other Mother     c difficile gastroenteritis  . Lung cancer Father     smoker  . Liver cancer Brother   . Asthma      grandmother  . Other      bronchitis-grandmother  . Liver disease Brother      Prior to Admission medications   Medication Sig Start Date End Date Taking? Authorizing Provider  conjugated estrogens (PREMARIN) vaginal cream Place 0.5 g vaginally 2 (two) times a week. Patient states that uses this just whenever she remembers during the week no specific days to list.   Yes Historical Provider, MD  fluticasone (FLONASE) 50 MCG/ACT nasal spray Place 2 sprays into the nose daily as needed for rhinitis. For nasal drainage   Yes Historical Provider, MD  LORazepam (ATIVAN) 0.5 MG tablet Take 0.25-0.5 tablets by mouth every 8 (eight) hours as needed. Take 0.5 tablet twice a day Take 1 tablet at bedtime 01/27/11  Yes Historical Provider, MD  PREMARIN 0.625 MG tablet Take 1 tablet by mouth Daily. 02/10/11  Yes Historical Provider, MD  spironolactone (ALDACTONE) 100 MG tablet Take 0.5 tablets by mouth 3 (three) times daily.  01/27/11  Yes Historical Provider, MD  tamsulosin (FLOMAX) 0.4 MG CAPS Take 0.4 mg by mouth daily.   Yes Historical Provider, MD  ZOVIRAX 200 MG capsule Take 1 capsule by mouth daily. 01/11/11  Yes Historical Provider, MD  EPINEPHrine (EPIPEN) 0.3 mg/0.3 mL DEVI Inject 0.3 mLs (0.3 mg total) into the  muscle once. Use as directed 12/28/11 12/27/12  Waymon Budge, MD   Physical Exam: Filed Vitals:   10/30/12 0350 10/30/12 0415 10/30/12 0500 10/30/12 0514  BP: 155/69 144/67 131/74   Pulse: 102 93 89 87  Temp:      TempSrc:      Resp: 16     SpO2: 99% 98% 97% 99%     General:  Well developed and moderately nourished.  Eyes: Anicteric no pallor.  ENT: No discharge from the ears eyes nose and mouth.  Neck: No mass felt.  Cardiovascular: S1-S2  heard.  Respiratory: No rhonchi no crepitations.  Abdomen: Soft mild tenderness in the epigastric area. No bowel sounds appreciated.  Skin: No rash.  Musculoskeletal: No edema.  Psychiatric: Appears normal.  Neurologic: Alert awake oriented to time place and person. Moves all extremities.  Labs on Admission:  Basic Metabolic Panel:  Recent Labs Lab 10/29/12 2345  NA 128*  K 3.9  CL 92*  CO2 24  GLUCOSE 174*  BUN 24*  CREATININE 0.62  CALCIUM 9.5   Liver Function Tests:  Recent Labs Lab 10/29/12 2345  AST 20  ALT 24  ALKPHOS 52  BILITOT 0.4  PROT 7.4  ALBUMIN 3.7    Recent Labs Lab 10/29/12 2345  LIPASE 41   No results found for this basename: AMMONIA,  in the last 168 hours CBC:  Recent Labs Lab 10/29/12 2345  WBC 15.7*  NEUTROABS 13.1*  HGB 13.1  HCT 38.7  MCV 83.9  PLT 242   Cardiac Enzymes: No results found for this basename: CKTOTAL, CKMB, CKMBINDEX, TROPONINI,  in the last 168 hours  BNP (last 3 results) No results found for this basename: PROBNP,  in the last 8760 hours CBG: No results found for this basename: GLUCAP,  in the last 168 hours  Radiological Exams on Admission: Ct Abdomen Pelvis W Contrast  10/30/2012  *RADIOLOGY REPORT*  Clinical Data: Abdominal pain and distension.  Right upper quadrant pain.  Nausea and vomiting.  White cell count 15.7.  CT ABDOMEN AND PELVIS WITH CONTRAST  Technique:  Multidetector CT imaging of the abdomen and pelvis was performed following the standard protocol during bolus administration of intravenous contrast.  Contrast: OMNIPAQUE IOHEXOL 300 MG/ML  SOLN  Comparison: None.  Findings: Dependent atelectasis in the lung bases.  Bilateral breast implants.  2.1 cm diameter peripherally enhancing lesion in the anterior segment right lobe of the liver consistent with cavernous hemangioma.  Additional scattered sub-centimeter hypodense nodules in the liver may represent cysts or hemangiomas but are too  small to characterize.  Surgical absence of the gallbladder.  The liver, pancreas, adrenal glands, kidneys, abdominal aorta, inferior vena cava, and retroperitoneal lymph nodes are unremarkable.  The stomach appears normal.  Stool filled colon without distension. Pelvic small bowel loops are fluid-filled and mildly distended with suggestion of mild wall thickening.  The ileum is decompressed. Zone of transition appears to be in the right lower quadrant. At the zone of transition, there is evidence of thick-walled bowel with enhancement.  Changes may represent inflammatory stricture. Consider inflammatory bowel disease or Crohn disease. Small amount of mesenteric fluid likely representing inflammatory reaction.  No free air or free fluid in the abdomen.  Pelvis:  The uterus appears to be surgically absent.  No abnormal adnexal masses.  The bladder wall is not thickened.  No diverticulitis.  There is a small amount of free fluid in the pelvis which may be inflammatory.  The  appendix is not well demarcated. Degenerative changes in the lumbar spine.  IMPRESSION: Mildly dilated fluid-filled jejunum with decompressed ileum.  There are interloop fluid collections with enhanced wall thickening in the zone of transition suggesting inflammatory process such as Crohn's disease. 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   Original Report Authenticated By: Burman Nieves, M.D.    Dg Abd Acute W/chest  10/30/2012  *RADIOLOGY REPORT*  Clinical Data: Abdominal pain and distention.  Nausea and vomiting.  ACUTE ABDOMEN SERIES (ABDOMEN 2 VIEW & CHEST 1 VIEW)  Comparison: No prior abdominal imaging.  Two-view chest x-ray 07/11/2011.  Findings: Long segment of mildly distended small bowel in the pelvis.  No small bowel distention elsewhere.  Large stool burden throughout normal caliber colon.  No evidence of free air on the lateral decubitus image.  Air-fluid level within the dilated loop of small bowel.  Surgical clips in the right upper  quadrant from prior cholecystectomy.  No visible opaque urinary tract calculi. Degenerative changes involving the lower lumbar spine.  Cardiac silhouette upper normal in size, unchanged.  Emphysematous changes in the upper lobes, unchanged.  Lungs clear. Bronchovascular markings normal.  Pulmonary vascularity normal.  No pneumothorax.  No pleural effusions.  IMPRESSION:  1.  Long segment of mildly distended small bowel in the pelvis. This may be due to enteritis, a localized ileus, or may be a manifestation of an early partial small bowel obstruction.  Follow- up abdominal x-rays may be helpful to make this determination. 2.  No free intraperitoneal air. 3.  COPD/emphysema.  No acute cardiopulmonary disease.   Original Report Authenticated By: Hulan Saas, M.D.     Assessment/Plan Principal Problem:   SBO (small bowel obstruction) Active Problems:   RHINOSINUSITIS, CHRONIC   Hyponatremia   1. Small bowel obstruction - transition point is at the jejunum. Concerning for possible inflammatory process like Crohn's disease. Since patient's last significant abdominal pain I have consulted surgery Dr. Daphine Deutscher who will be seeing patient in consult. Need to consult gastroenterology for further recommendations regarding to possible Crohn's disease. At this time patient will be kept n.p.o. with continuous NG tube suction. Pain relief medication and IV hydration. 2. Hyponatremia - probably from dehydration and spironolactone. Continue with IV fluids gently and recheck metabolic panel. Since patient is dehydrated discontinue spironolactone for now. 3. History of herpes genitalis - I have advised pharmacy to dose patient's Zovirax. 4. History of anxiety - patient states that she is very anxious without benzodiazepine. This time patient is n.p.o. so I have placed patient on small doses of 0.25 mg Ativan IV Q8 hourly when necessary for anxiety. 5. History of allergic rhinitis - continue Flonase. 6. Leukocytosis  probably reactionary - follow CBC. 7. History of possible COPD - not wheezing.    Code Status: Full code.  Family Communication: None.  Disposition Plan: Admitted to inpatient under Dr. Ferd Hibbs service. ER physician has already contacted them and made aware of the admission.   Jahnyla Parrillo N. [facility] Pager [phone].  If 7PM-7AM, please contact night-coverage [email or web address] Password [facility] 10/30/2012, 5:51 AM

## 2012-10-31 ENCOUNTER — Inpatient Hospital Stay (HOSPITAL_COMMUNITY): Payer: BC Managed Care – PPO

## 2012-10-31 DIAGNOSIS — K565 Intestinal adhesions [bands], unspecified as to partial versus complete obstruction: Secondary | ICD-10-CM

## 2012-10-31 LAB — BASIC METABOLIC PANEL
BUN: 18 mg/dL (ref 6–23)
CO2: 25 mEq/L (ref 19–32)
Calcium: 8.5 mg/dL (ref 8.4–10.5)
Chloride: 102 mEq/L (ref 96–112)
Creatinine, Ser: 0.61 mg/dL (ref 0.50–1.10)
GFR calc Af Amer: >90 mL/min (ref >90)
GFR calc non Af Amer: >90 mL/min (ref >90)
Glucose, Bld: 129 mg/dL — ABNORMAL HIGH (ref 70–99)
Potassium: 3.6 mEq/L (ref 3.5–5.1)
Sodium: 136 mEq/L (ref 135–145)

## 2012-10-31 LAB — CBC
HCT: 34.9 % — ABNORMAL LOW (ref 36.0–46.0)
Hemoglobin: 11.9 g/dL — ABNORMAL LOW (ref 12.0–15.0)
MCH: 29 pg (ref 26.0–34.0)
MCHC: 34.1 g/dL (ref 30.0–36.0)
MCV: 84.9 fL (ref 78.0–100.0)
Platelets: 244 10*3/uL (ref 150–400)
RBC: 4.11 MIL/uL (ref 3.87–5.11)
RDW: 13.3 % (ref 11.5–15.5)
WBC: 10.8 10*3/uL — ABNORMAL HIGH (ref 4.0–10.5)

## 2012-10-31 MED ORDER — SODIUM CHLORIDE 0.9 % IV SOLN
INTRAVENOUS | Status: AC
  Administered 2012-10-31 – 2012-11-01 (×3): via INTRAVENOUS

## 2012-10-31 NOTE — Progress Notes (Signed)
Subjective: Less nausea, feels better  Objective: Vital signs in last 24 hours: Temp:  [98.8 F (37.1 C)-99.6 F (37.6 C)] 99.6 F (37.6 C) (03/20 0522) Pulse Rate:  [79-89] 79 (03/20 0522) Resp:  [16] 16 (03/20 0522) BP: (106-132)/(48-66) 106/48 mmHg (03/20 0522) SpO2:  [98 %-99 %] 98 % (03/20 0522) Last BM Date: 10/30/12 2  Small BMs reported yesterday, TM 99.6, labs OK, film shows ongoing SB dilatation with air fluid levels. 600 from NG. Intake/Output from previous day: 03/19 0701 - 03/20 0700 In: 527.8 [I.V.:423.8; IV Piggyback:104] Out: 1353 [Urine:750; Emesis/NG output:603] Intake/Output this shift:    General appearance: alert, cooperative and no distress GI: soft, but still distended, BS hypoactive.  Lab Results:   Recent Labs  10/30/12 0650 10/31/12 0355  WBC 9.8 10.8*  HGB 12.3 11.9*  HCT 36.2 34.9*  PLT 241 244    BMET  Recent Labs  10/30/12 0650 10/31/12 0355  NA 130* 136  K 4.3 3.6  CL 97 102  CO2 25 25  GLUCOSE 119* 129*  BUN 15 18  CREATININE 0.56 0.61  CALCIUM 8.4 8.5   PT/INR No results found for this basename: LABPROT, INR,  in the last 72 hours   Recent Labs Lab 10/29/12 2345 10/30/12 0650  AST 20 13  ALT 24 19  ALKPHOS 52 46  BILITOT 0.4 0.3  PROT 7.4 6.3  ALBUMIN 3.7 3.0*     Lipase     Component Value Date/Time   LIPASE 41 10/29/2012 2345     Studies/Results: Dg Abd 1 View  10/30/2012  *RADIOLOGY REPORT*  Clinical Data: Partial small bowel obstruction.  Vomiting. Abdominal pain and nausea.  ABDOMEN - 1 VIEW  Comparison: CT scan dated 10/02/2012 and radiographs dated 10/01/2012  Findings: NG tube in the fundus of the stomach.  There are no dilated loops of large or small bowel.  There is stool scattered throughout the nondistended colon.  The bladder is filled with contrast.  There appears to be a cystocele.  IMPRESSION: No visible dilated bile loops.  Cystocele.   Original Report Authenticated By: Francene Boyers, M.D.     Ct Abdomen Pelvis W Contrast  10/30/2012  *RADIOLOGY REPORT*  Clinical Data: Abdominal pain and distension.  Right upper quadrant pain.  Nausea and vomiting.  White cell count 15.7.  CT ABDOMEN AND PELVIS WITH CONTRAST  Technique:  Multidetector CT imaging of the abdomen and pelvis was performed following the standard protocol during bolus administration of intravenous contrast.  Contrast: OMNIPAQUE IOHEXOL 300 MG/ML  SOLN  Comparison: None.  Findings: Dependent atelectasis in the lung bases.  Bilateral breast implants.  2.1 cm diameter peripherally enhancing lesion in the anterior segment right lobe of the liver consistent with cavernous hemangioma.  Additional scattered sub-centimeter hypodense nodules in the liver may represent cysts or hemangiomas but are too small to characterize.  Surgical absence of the gallbladder.  The liver, pancreas, adrenal glands, kidneys, abdominal aorta, inferior vena cava, and retroperitoneal lymph nodes are unremarkable.  The stomach appears normal.  Stool filled colon without distension. Pelvic small bowel loops are fluid-filled and mildly distended with suggestion of mild wall thickening.  The ileum is decompressed. Zone of transition appears to be in the right lower quadrant. At the zone of transition, there is evidence of thick-walled bowel with enhancement.  Changes may represent inflammatory stricture. Consider inflammatory bowel disease or Crohn disease. Small amount of mesenteric fluid likely representing inflammatory reaction.  No free air or  free fluid in the abdomen.  Pelvis:  The uterus appears to be surgically absent.  No abnormal adnexal masses.  The bladder wall is not thickened.  No diverticulitis.  There is a small amount of free fluid in the pelvis which may be inflammatory.  The appendix is not well demarcated. Degenerative changes in the lumbar spine.  IMPRESSION: Mildly dilated fluid-filled jejunum with decompressed ileum.  There are interloop fluid  collections with enhanced wall thickening in the zone of transition suggesting inflammatory process such as Crohn's disease. 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   Original Report Authenticated By: Burman Nieves, M.D.    Dg Abd 2 Views  10/31/2012  *RADIOLOGY REPORT*  Clinical Data: Partial small bowel obstruction, follow  ABDOMEN - 2 VIEW  Comparison: Abdomen films of 10/30/2012  Findings: There is small bowel dilatation particularly in the left abdomen with air-fluid levels consistent with a persistent partial small bowel obstruction.  An NG tube is noted with the tip in the proximal body of the stomach.  Surgical clips overlie the right upper quadrant from prior cholecystectomy.  IMPRESSION: Persistently dilated small bowel loop in the left abdomen with air- fluid levels consistent with persistent partial small bowel obstruction.   Original Report Authenticated By: Dwyane Dee, M.D.    Dg Abd Acute W/chest  10/30/2012  *RADIOLOGY REPORT*  Clinical Data: Abdominal pain and distention.  Nausea and vomiting.  ACUTE ABDOMEN SERIES (ABDOMEN 2 VIEW & CHEST 1 VIEW)  Comparison: No prior abdominal imaging.  Two-view chest x-ray 07/11/2011.  Findings: Long segment of mildly distended small bowel in the pelvis.  No small bowel distention elsewhere.  Large stool burden throughout normal caliber colon.  No evidence of free air on the lateral decubitus image.  Air-fluid level within the dilated loop of small bowel.  Surgical clips in the right upper quadrant from prior cholecystectomy.  No visible opaque urinary tract calculi. Degenerative changes involving the lower lumbar spine.  Cardiac silhouette upper normal in size, unchanged.  Emphysematous changes in the upper lobes, unchanged.  Lungs clear. Bronchovascular markings normal.  Pulmonary vascularity normal.  No pneumothorax.  No pleural effusions.  IMPRESSION:  1.  Long segment of mildly distended small bowel in the pelvis. This may be due to enteritis, a localized  ileus, or may be a manifestation of an early partial small bowel obstruction.  Follow- up abdominal x-rays may be helpful to make this determination. 2.  No free intraperitoneal air. 3.  COPD/emphysema.  No acute cardiopulmonary disease.   Original Report Authenticated By: Hulan Saas, M.D.     Medications: . acyclovir  200 mg Intravenous Daily  . methylPREDNISolone (SOLU-MEDROL) injection  80 mg Intravenous Q12H    Assessment/Plan 1. Partial small bowel obstruction; enteritis versus mechanical.  2. Cystocele with prior bladder surgery and bladder repairs.  3. History of pericarditis.  4. History of Mnire's and vertigo  5. Mild MR  6. Degenerative disc disease with prior surgeries  7. GERD  8. History of anxiety   Plan:  Continue bowel rest, and watch for now.   LOS: 2 days    Kyi Romanello 10/31/2012

## 2012-10-31 NOTE — Progress Notes (Signed)
Subjective: f/Up SBO, Hyponatrmia, Leukocytosis, Ab Pain. NGT in place. NPO. 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 vrs Adhesions.   Feeling better.  + BM. Enjoying sips of sprite  Date of Last Colonoscopy/EGD:  08/30/12 [facility] surgical   Cholecystectomy 1998 hysterectomy 1981 bladder surgery 2003 bladder repaired 2011  Objective: Vital signs in last 24 hours: Temp:  [98.8 F (37.1 C)-99.6 F (37.6 C)] 99.6 F (37.6 C) (03/20 0522) Pulse Rate:  [79-89] 79 (03/20 0522) Resp:  [16] 16 (03/20 0522) BP: (106-132)/(48-66) 106/48 mmHg (03/20 0522) SpO2:  [98 %-99 %] 98 % (03/20 0522) Weight change:  Last BM Date: 10/30/12  CBG (last 3)  No results found for this basename: GLUCAP,  in the last 72 hours  Intake/Output from previous day:  Intake/Output Summary (Last 24 hours) at 10/31/12 0658 Last data filed at 10/31/12 0500  Gross per 24 hour  Intake 527.75 ml  Output   1353 ml  Net -825.25 ml   03/19 0701 - 03/20 0700 In: 527.8 [I.V.:423.8; IV Piggyback:104] Out: 1353 [Urine:750; Emesis/NG output:603]   Physical Exam  General appearance: A and O Eyes: no scleral icterus Nare - NGT in place. Throat: Dry Resp: CTA B Cardio: Reg min m GI: Bloated, Mild BS, mild Tender (worse RUQ) but better. Extremities: no clubbing, cyanosis or edema   Lab Results:  Recent Labs  10/30/12 0650 10/31/12 0355  NA 130* 136  K 4.3 3.6  CL 97 102  CO2 25 25  GLUCOSE 119* 129*  BUN 15 18  CREATININE 0.56 0.61  CALCIUM 8.4 8.5     Recent Labs  10/29/12 2345 10/30/12 0650  AST 20 13  ALT 24 19  ALKPHOS 52 46  BILITOT 0.4 0.3  PROT 7.4 6.3  ALBUMIN 3.7 3.0*     Recent Labs  10/29/12 2345 10/30/12 0650 10/31/12 0355  WBC 15.7* 9.8 10.8*  NEUTROABS 13.1* 8.5*  --   HGB 13.1 12.3 11.9*  HCT 38.7 36.2 34.9*  MCV 83.9 84.2 84.9  PLT 242 241 244    No  results found for this basename: INR,  PROTIME    No results found for this basename: CKTOTAL, CKMB, CKMBINDEX, TROPONINI,  in the last 72 hours  No results found for this basename: TSH, T4TOTAL, FREET3, T3FREE, THYROIDAB,  in the last 72 hours  No results found for this basename: VITAMINB12, FOLATE, FERRITIN, TIBC, IRON, RETICCTPCT,  in the last 72 hours  Micro Results: No results found for this or any previous visit (from the past 240 hour(s)).   Studies/Results: Dg Abd 1 View  10/30/2012  *RADIOLOGY REPORT*  Clinical Data: Partial small bowel obstruction.  Vomiting. Abdominal pain and nausea.  ABDOMEN - 1 VIEW  Comparison: CT scan dated 10/02/2012 and radiographs dated 10/01/2012  Findings: NG tube in the fundus of the stomach.  There are no dilated loops of large or small bowel.  There is stool scattered throughout the nondistended colon.  The bladder is filled with contrast.  There appears to be a cystocele.  IMPRESSION: No visible dilated bile loops.  Cystocele.   Original Report Authenticated By: Francene Boyers, M.D.    Ct Abdomen Pelvis W Contrast  10/30/2012  *RADIOLOGY REPORT*  Clinical Data: Abdominal pain and distension.  Right upper quadrant pain.  Nausea and vomiting.  White cell count 15.7.  CT ABDOMEN AND PELVIS WITH CONTRAST  Technique:  Multidetector CT imaging of the abdomen and pelvis was performed following the standard protocol during bolus administration of intravenous contrast.  Contrast: OMNIPAQUE IOHEXOL 300 MG/ML  SOLN  Comparison: None.  Findings: Dependent atelectasis in the lung bases.  Bilateral breast implants.  2.1 cm diameter peripherally enhancing lesion in the anterior segment right lobe of the liver consistent with cavernous hemangioma.  Additional scattered sub-centimeter hypodense nodules in the liver may represent cysts or hemangiomas but are too small to characterize.  Surgical absence of the gallbladder.  The liver, pancreas, adrenal glands, kidneys,  abdominal aorta, inferior vena cava, and retroperitoneal lymph nodes are unremarkable.  The stomach appears normal.  Stool filled colon without distension. Pelvic small bowel loops are fluid-filled and mildly distended with suggestion of mild wall thickening.  The ileum is decompressed. Zone of transition appears to be in the right lower quadrant. At the zone of transition, there is evidence of thick-walled bowel with enhancement.  Changes may represent inflammatory stricture. Consider inflammatory bowel disease or Crohn disease. Small amount of mesenteric fluid likely representing inflammatory reaction.  No free air or free fluid in the abdomen.  Pelvis:  The uterus appears to be surgically absent.  No abnormal adnexal masses.  The bladder wall is not thickened.  No diverticulitis.  There is a small amount of free fluid in the pelvis which may be inflammatory.  The appendix is not well demarcated. Degenerative changes in the lumbar spine.  IMPRESSION: Mildly dilated fluid-filled jejunum with decompressed ileum.  There are interloop fluid collections with enhanced wall thickening in the zone of transition suggesting inflammatory process such as Crohn's disease. 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   Original Report Authenticated By: Burman Nieves, M.D.    Dg Abd Acute W/chest  10/30/2012  *RADIOLOGY REPORT*  Clinical Data: Abdominal pain and distention.  Nausea and vomiting.  ACUTE ABDOMEN SERIES (ABDOMEN 2 VIEW & CHEST 1 VIEW)  Comparison: No prior abdominal imaging.  Two-view chest x-ray 07/11/2011.  Findings: Long segment of mildly distended small bowel in the pelvis.  No small bowel distention elsewhere.  Large stool burden throughout normal caliber colon.  No evidence of free air on the lateral decubitus image.  Air-fluid level within the dilated loop of small bowel.  Surgical clips in the right upper quadrant from prior cholecystectomy.  No visible opaque urinary tract calculi. Degenerative changes  involving the lower lumbar spine.  Cardiac silhouette upper normal in size, unchanged.  Emphysematous changes in the upper lobes, unchanged.  Lungs clear. Bronchovascular markings normal.  Pulmonary vascularity normal.  No pneumothorax.  No pleural effusions.  IMPRESSION:  1.  Long segment of mildly distended small bowel in the pelvis. This may be due to enteritis, a localized ileus, or may be a manifestation of an early partial small bowel obstruction.  Follow- up abdominal x-rays may be helpful to make this determination. 2.  No free intraperitoneal air. 3.  COPD/emphysema.  No acute cardiopulmonary disease.   Original Report Authenticated By: Hulan Saas, M.D.      Medications: Scheduled: . acyclovir  200 mg Intravenous Daily  . methylPREDNISolone (SOLU-MEDROL) injection  80 mg Intravenous Q12H   Continuous:     Assessment/Plan: Principal Problem:   SBO (small bowel obstruction) Active Problems:   RHINOSINUSITIS, CHRONIC   Hyponatremia   1. Small bowel obstruction - transition point is at the jejunum. Improving.  Concerning for possible inflammatory process like Crohn's disease but more probable is adhesions from prior surgeries or just a  significant Viral Gastroenteritis . Seen yesterday by Dr Gerrit Friends and being Rxed conservatively. Will hold on GI consult unless surgery wants me to call them - May need capsule endo as outpt.  Continue n.p.o. with continuous NG tube suction. Pain relief medications, Antiemetics, and IV hydration.  She had GI W/Up in Jan at [facility] 2. Hyponatremia - resolved and secondary to dehydration and spironolactone.  3. History of herpes genitalis -  Zovirax IV. 4. Anxiety -benzodiazepine to = 0.25 mg Ativan IV Q8 when necessary for anxiety. Usually uses 0.5 2 tabs daily and 1 HS. 5. History of allergic rhinitis - continue Flonase. 6. Leukocytosis - Better.. 7. Moderate MR - Released per Dr Garnette Scheuermann 8. Meniere's - NTD - Hold the Aldactone. 9.  Mult UTIs  per Dr Logan Bores her Uro. On Flomax per him  ID -  Anti-infectives   Start     Dose/Rate Route Frequency Ordered Stop   10/30/12 1000  acyclovir (ZOVIRAX) 200 mg in dextrose 5 % 100 mL IVPB     200 mg 104 mL/hr over 60 Minutes Intravenous Daily 10/30/12 0602       DVT Prophylaxis    LOS: 2 days   Quenisha Lovins M 10/31/2012, 6:58 AM

## 2012-10-31 NOTE — Progress Notes (Signed)
Pt ambulated in hall with nurse today. Walked entirety of east side and west side. Pt has no dyspnea, SOB, or weakness.

## 2012-10-31 NOTE — Progress Notes (Signed)
General Surgery [facility]  Clinically much improved.  Some tenderness on exam.  Encouraged to ambulate in halls.  Will continue IVF, NG tube, and NPO except for ice.  No operative intervention planned at present.  Will follow closely with you.  [name], MD, [facility] Office: [phone]

## 2012-11-01 ENCOUNTER — Encounter (HOSPITAL_COMMUNITY): Payer: Self-pay | Admitting: Anesthesiology

## 2012-11-01 ENCOUNTER — Inpatient Hospital Stay (HOSPITAL_COMMUNITY): Payer: BC Managed Care – PPO | Admitting: Anesthesiology

## 2012-11-01 ENCOUNTER — Inpatient Hospital Stay (HOSPITAL_COMMUNITY): Payer: BC Managed Care – PPO

## 2012-11-01 ENCOUNTER — Encounter (HOSPITAL_COMMUNITY)
Admission: EM | Disposition: A | Discharge status: Home / Self Care | Payer: Self-pay | Source: Home / Self Care | Attending: Internal Medicine

## 2012-11-01 HISTORY — PX: LAPAROSCOPY: SHX197

## 2012-11-01 HISTORY — PX: LYSIS OF ADHESION: SHX5961

## 2012-11-01 HISTORY — PX: LAPAROTOMY: SHX154

## 2012-11-01 HISTORY — PX: BOWEL RESECTION: SHX1257

## 2012-11-01 LAB — CBC
HCT: 38.7 % (ref 36.0–46.0)
Hemoglobin: 12.8 g/dL (ref 12.0–15.0)
MCH: 28.4 pg (ref 26.0–34.0)
MCHC: 33.1 g/dL (ref 30.0–36.0)
MCV: 85.8 fL (ref 78.0–100.0)
Platelets: 255 10*3/uL (ref 150–400)
RBC: 4.51 MIL/uL (ref 3.87–5.11)
RDW: 13.1 % (ref 11.5–15.5)
WBC: 11.8 10*3/uL — ABNORMAL HIGH (ref 4.0–10.5)

## 2012-11-01 LAB — CREATININE, SERUM
Creatinine, Ser: 0.53 mg/dL (ref 0.50–1.10)
GFR calc Af Amer: >90 mL/min (ref >90)
GFR calc non Af Amer: >90 mL/min (ref >90)

## 2012-11-01 SURGERY — LAPAROTOMY, EXPLORATORY
Anesthesia: General | Site: Abdomen | Wound class: Clean Contaminated

## 2012-11-01 MED ORDER — SODIUM CHLORIDE 0.9 % IJ SOLN: 9.0000 mL | INTRAMUSCULAR | Status: DC | PRN

## 2012-11-01 MED ORDER — CISATRACURIUM BESYLATE (PF) 10 MG/5ML IV SOLN
INTRAVENOUS | Status: DC | PRN
  Administered 2012-11-01: 1 mg via INTRAVENOUS
  Administered 2012-11-01: 4 mg via INTRAVENOUS
  Administered 2012-11-01: 1 mg via INTRAVENOUS

## 2012-11-01 MED ORDER — ONDANSETRON HCL 4 MG/2ML IJ SOLN: 4.0000 mg | Freq: Four times a day (QID) | INTRAMUSCULAR | Status: DC | PRN

## 2012-11-01 MED ORDER — MIDAZOLAM HCL 5 MG/5ML IJ SOLN
INTRAMUSCULAR | Status: DC | PRN
  Administered 2012-11-01: 1 mg via INTRAVENOUS

## 2012-11-01 MED ORDER — SODIUM CHLORIDE 0.9 % IV SOLN
1.0000 g | Freq: Once | INTRAVENOUS | Status: AC
  Administered 2012-11-01: 1 g via INTRAVENOUS
  Filled 2012-11-01: qty 1

## 2012-11-01 MED ORDER — NEOSTIGMINE METHYLSULFATE 1 MG/ML IJ SOLN
INTRAMUSCULAR | Status: DC | PRN
  Administered 2012-11-01: 2.5 mg via INTRAVENOUS

## 2012-11-01 MED ORDER — KCL IN DEXTROSE-NACL 20-5-0.45 MEQ/L-%-% IV SOLN
INTRAVENOUS | Status: DC
  Administered 2012-11-01 – 2012-11-06 (×11): via INTRAVENOUS
  Filled 2012-11-01 (×14): qty 1000

## 2012-11-01 MED ORDER — DIPHENHYDRAMINE HCL 12.5 MG/5ML PO ELIX: 12.5000 mg | ORAL_SOLUTION | Freq: Four times a day (QID) | ORAL | Status: DC | PRN

## 2012-11-01 MED ORDER — ONDANSETRON HCL 4 MG PO TABS: 4.0000 mg | ORAL_TABLET | Freq: Four times a day (QID) | ORAL | Status: DC | PRN

## 2012-11-01 MED ORDER — DIPHENHYDRAMINE HCL 50 MG/ML IJ SOLN: 12.5000 mg | Freq: Four times a day (QID) | INTRAMUSCULAR | Status: DC | PRN

## 2012-11-01 MED ORDER — LIDOCAINE HCL (CARDIAC) 20 MG/ML IV SOLN
INTRAVENOUS | Status: DC | PRN
  Administered 2012-11-01: 50 mg via INTRAVENOUS

## 2012-11-01 MED ORDER — SODIUM CHLORIDE 0.9 % IR SOLN
Status: DC | PRN
  Administered 2012-11-01: 2000 mL

## 2012-11-01 MED ORDER — PROMETHAZINE HCL 25 MG/ML IJ SOLN: 12.5000 mg | INTRAMUSCULAR | Status: DC | PRN

## 2012-11-01 MED ORDER — FENTANYL CITRATE 0.05 MG/ML IJ SOLN
25.0000 ug | INTRAMUSCULAR | Status: DC | PRN
  Administered 2012-11-01 (×2): 50 ug via INTRAVENOUS

## 2012-11-01 MED ORDER — LACTATED RINGERS IV SOLN
INTRAVENOUS | Status: DC | PRN
  Administered 2012-11-01 (×3): via INTRAVENOUS

## 2012-11-01 MED ORDER — HYDROMORPHONE HCL PF 1 MG/ML IJ SOLN
0.2500 mg | INTRAMUSCULAR | Status: DC | PRN
  Administered 2012-11-01: 0.5 mg via INTRAVENOUS

## 2012-11-01 MED ORDER — PROMETHAZINE HCL 25 MG/ML IJ SOLN: 6.2500 mg | INTRAMUSCULAR | Status: DC | PRN

## 2012-11-01 MED ORDER — ONDANSETRON HCL 4 MG/2ML IJ SOLN
INTRAMUSCULAR | Status: DC | PRN
  Administered 2012-11-01: 4 mg via INTRAVENOUS

## 2012-11-01 MED ORDER — HYDROMORPHONE 0.3 MG/ML IV SOLN
INTRAVENOUS | Status: DC
  Administered 2012-11-01: 1.2 mg via INTRAVENOUS
  Administered 2012-11-01 – 2012-11-02 (×3): 0.3 mg via INTRAVENOUS
  Administered 2012-11-02: 0.9 mg via INTRAVENOUS
  Administered 2012-11-02: 17:00:00 via INTRAVENOUS
  Administered 2012-11-02: 0.9 mg via INTRAVENOUS
  Administered 2012-11-02 (×2): 1.2 mg via INTRAVENOUS
  Administered 2012-11-03: 0.6 mg via INTRAVENOUS
  Administered 2012-11-03: 0.3 mg via INTRAVENOUS
  Administered 2012-11-03: 0.9 mg via INTRAVENOUS
  Administered 2012-11-03: 0.6 mg via INTRAVENOUS
  Administered 2012-11-04: 1.2 mg via INTRAVENOUS
  Administered 2012-11-04: 0.9 mg via INTRAVENOUS
  Administered 2012-11-04: 0.6 mg via INTRAVENOUS
  Administered 2012-11-04: 23:00:00 via INTRAVENOUS
  Administered 2012-11-04: 0.3 mg via INTRAVENOUS
  Administered 2012-11-05: 0.9 mg via INTRAVENOUS
  Administered 2012-11-05: 1.2 mg via INTRAVENOUS
  Administered 2012-11-05: 1.3 mg via INTRAVENOUS
  Administered 2012-11-05: 0.6 mg via INTRAVENOUS
  Administered 2012-11-06: 0.3 mg via INTRAVENOUS
  Administered 2012-11-06: 0.1 mg via INTRAVENOUS
  Administered 2012-11-06: 0.3 mg via INTRAVENOUS
  Administered 2012-11-06: 1.3 mg via INTRAVENOUS
  Filled 2012-11-01 (×2): qty 25

## 2012-11-01 MED ORDER — HYDROMORPHONE HCL PF 1 MG/ML IJ SOLN
INTRAMUSCULAR | Status: DC | PRN
  Administered 2012-11-01 (×5): .4 mg via INTRAVENOUS

## 2012-11-01 MED ORDER — PROPOFOL 10 MG/ML IV BOLUS
INTRAVENOUS | Status: DC | PRN
  Administered 2012-11-01: 100 mg via INTRAVENOUS

## 2012-11-01 MED ORDER — NALOXONE HCL 0.4 MG/ML IJ SOLN: 0.4000 mg | INTRAMUSCULAR | Status: DC | PRN

## 2012-11-01 MED ORDER — PANTOPRAZOLE SODIUM 40 MG IV SOLR
40.0000 mg | INTRAVENOUS | Status: DC
  Administered 2012-11-01 – 2012-11-06 (×6): 40 mg via INTRAVENOUS
  Filled 2012-11-01 (×7): qty 40

## 2012-11-01 MED ORDER — ENOXAPARIN SODIUM 40 MG/0.4ML ~~LOC~~ SOLN
40.0000 mg | SUBCUTANEOUS | Status: DC
  Administered 2012-11-02 – 2012-11-07 (×6): 40 mg via SUBCUTANEOUS
  Filled 2012-11-01 (×7): qty 0.4

## 2012-11-01 MED ORDER — GLYCOPYRROLATE 0.2 MG/ML IJ SOLN
INTRAMUSCULAR | Status: DC | PRN
  Administered 2012-11-01: .4 mg via INTRAVENOUS

## 2012-11-01 MED ORDER — SUFENTANIL CITRATE 50 MCG/ML IV SOLN
INTRAVENOUS | Status: DC | PRN
  Administered 2012-11-01: 15 ug via INTRAVENOUS
  Administered 2012-11-01 (×2): 5 ug via INTRAVENOUS
  Administered 2012-11-01: 10 ug via INTRAVENOUS
  Administered 2012-11-01: 5 ug via INTRAVENOUS

## 2012-11-01 MED ORDER — SODIUM CHLORIDE 0.9 % IV SOLN
1.0000 g | INTRAVENOUS | Status: DC
  Administered 2012-11-02 – 2012-11-06 (×5): 1 g via INTRAVENOUS
  Filled 2012-11-01 (×5): qty 1

## 2012-11-01 MED ORDER — PANTOPRAZOLE SODIUM 40 MG IV SOLR
40.0000 mg | INTRAVENOUS | Status: DC
  Filled 2012-11-01: qty 40

## 2012-11-01 MED ORDER — SUCCINYLCHOLINE CHLORIDE 20 MG/ML IJ SOLN
INTRAMUSCULAR | Status: DC | PRN
  Administered 2012-11-01: 60 mg via INTRAVENOUS

## 2012-11-01 SURGICAL SUPPLY — 58 items
APPLICATOR COTTON TIP 6IN STRL (MISCELLANEOUS) ×2 IMPLANT
BENZOIN TINCTURE PRP APPL 2/3 (GAUZE/BANDAGES/DRESSINGS) IMPLANT
BLADE EXTENDED COATED 6.5IN (ELECTRODE) IMPLANT
BLADE HEX COATED 2.75 (ELECTRODE) ×4 IMPLANT
CANISTER SUCTION 2500CC (MISCELLANEOUS) ×2 IMPLANT
CANNULA ENDOPATH XCEL 11M (ENDOMECHANICALS) IMPLANT
CLOTH BEACON ORANGE TIMEOUT ST (SAFETY) ×2 IMPLANT
COVER MAYO STAND STRL (DRAPES) IMPLANT
DECANTER SPIKE VIAL GLASS SM (MISCELLANEOUS) IMPLANT
DRAPE LAPAROSCOPIC ABDOMINAL (DRAPES) ×2 IMPLANT
DRAPE WARM FLUID 44X44 (DRAPE) IMPLANT
ELECT REM PT RETURN 9FT ADLT (ELECTROSURGICAL) ×2
ELECTRODE REM PT RTRN 9FT ADLT (ELECTROSURGICAL) ×1 IMPLANT
GLOVE BIOGEL PI IND STRL 7.0 (GLOVE) ×1 IMPLANT
GLOVE BIOGEL PI INDICATOR 7.0 (GLOVE) ×1
GLOVE SURG ORTHO 8.0 STRL STRW (GLOVE) ×2 IMPLANT
GOWN STRL NON-REIN LRG LVL3 (GOWN DISPOSABLE) ×2 IMPLANT
GOWN STRL REIN XL XLG (GOWN DISPOSABLE) ×4 IMPLANT
HAND ACTIVATED (MISCELLANEOUS) IMPLANT
KIT BASIN OR (CUSTOM PROCEDURE TRAY) ×2 IMPLANT
NS IRRIG 1000ML POUR BTL (IV SOLUTION) ×4 IMPLANT
PACK GENERAL/GYN (CUSTOM PROCEDURE TRAY) ×2 IMPLANT
PENCIL BUTTON HOLSTER BLD 10FT (ELECTRODE) ×2 IMPLANT
RELOAD PROXIMATE 75MM BLUE (ENDOMECHANICALS) ×4 IMPLANT
SEPRAFILM PROCEDURAL PACK 3X5 (MISCELLANEOUS) ×2 IMPLANT
SET IRRIG TUBING LAPAROSCOPIC (IRRIGATION / IRRIGATOR) IMPLANT
SOLUTION ANTI FOG 6CC (MISCELLANEOUS) ×2 IMPLANT
SPONGE GAUZE 4X4 12PLY (GAUZE/BANDAGES/DRESSINGS) ×2 IMPLANT
SPONGE LAP 18X18 X RAY DECT (DISPOSABLE) IMPLANT
STAPLER GUN LINEAR PROX 60 (STAPLE) ×2 IMPLANT
STAPLER PROXIMATE 75MM BLUE (STAPLE) ×2 IMPLANT
STAPLER VISISTAT 35W (STAPLE) ×2 IMPLANT
STRIP CLOSURE SKIN 1/2X4 (GAUZE/BANDAGES/DRESSINGS) IMPLANT
SUCTION POOLE TIP (SUCTIONS) ×4 IMPLANT
SUT NOV 1 T60/GS (SUTURE) ×2 IMPLANT
SUT NOVA NAB DX-16 0-1 5-0 T12 (SUTURE) ×6 IMPLANT
SUT SILK 2 0 (SUTURE) ×1
SUT SILK 2 0 SH CR/8 (SUTURE) ×2 IMPLANT
SUT SILK 2-0 18XBRD TIE 12 (SUTURE) ×1 IMPLANT
SUT SILK 3 0 (SUTURE)
SUT SILK 3 0 SH CR/8 (SUTURE) ×4 IMPLANT
SUT SILK 3-0 18XBRD TIE 12 (SUTURE) IMPLANT
SUT VIC AB 4-0 PS2 27 (SUTURE) IMPLANT
SUT VICRYL 2 0 18  UND BR (SUTURE)
SUT VICRYL 2 0 18 UND BR (SUTURE) IMPLANT
SYR BULB IRRIGATION 50ML (SYRINGE) ×2 IMPLANT
TAPE CLOTH SURG 4X10 WHT LF (GAUZE/BANDAGES/DRESSINGS) ×2 IMPLANT
TOWEL OR 17X26 10 PK STRL BLUE (TOWEL DISPOSABLE) ×4 IMPLANT
TRAY FOLEY CATH 14FRSI W/METER (CATHETERS) IMPLANT
TRAY LAP CHOLE (CUSTOM PROCEDURE TRAY) ×2 IMPLANT
TROCAR BLADELESS OPT 5 75 (ENDOMECHANICALS) ×2 IMPLANT
TROCAR SLEEVE XCEL 5X75 (ENDOMECHANICALS) ×2 IMPLANT
TROCAR XCEL BLUNT TIP 100MML (ENDOMECHANICALS) ×2 IMPLANT
TROCAR XCEL NON-BLD 11X100MML (ENDOMECHANICALS) IMPLANT
TUBING INSUFFLATION 10FT LAP (TUBING) ×2 IMPLANT
WATER STERILE IRR 1500ML POUR (IV SOLUTION) ×2 IMPLANT
YANKAUER SUCT BULB TIP 10FT TU (MISCELLANEOUS) ×2 IMPLANT
YANKAUER SUCT BULB TIP NO VENT (SUCTIONS) ×2 IMPLANT

## 2012-11-01 NOTE — Transfer of Care (Signed)
Immediate Anesthesia Transfer of Care Note  Patient: Haley Wade  Procedure(s) Performed: Procedure(s): LAPAROSCOPY DIAGNOSTIC (N/A) EXPLORATORY LAPAROTOMY (N/A) LYSIS OF ADHESION (N/A) SMALL BOWEL RESECTION (N/A)  Patient Location: PACU  Anesthesia Type:General  Level of Consciousness: awake, alert  and oriented  Airway & Oxygen Therapy: Patient Spontanous Breathing and Patient connected to face mask oxygen  Post-op Assessment: Report given to PACU RN and Post -op Vital signs reviewed and stable  Post vital signs: Reviewed and stable  Complications: No apparent anesthesia complications

## 2012-11-01 NOTE — Anesthesia Preprocedure Evaluation (Addendum)
Anesthesia Evaluation  Patient identified by MRN, date of birth, ID band Patient awake    Reviewed: Allergy & Precautions, H&P , NPO status , Patient's Chart, lab work & pertinent test results  Airway Mallampati: II TM Distance: >3 FB Neck ROM: Full    Dental no notable dental hx.    Pulmonary COPD Dr. Roxy Cedar note reviewed. 5/13 breath sounds clear to auscultation  Pulmonary exam normal       Cardiovascular Exercise Tolerance: Good negative cardio ROS  Rhythm:Regular Rate:Normal     Neuro/Psych negative neurological ROS  negative psych ROS   GI/Hepatic negative GI ROS, Neg liver ROS,   Endo/Other  negative endocrine ROS  Renal/GU negative Renal ROS  negative genitourinary   Musculoskeletal negative musculoskeletal ROS (+)   Abdominal   Peds negative pediatric ROS (+)  Hematology negative hematology ROS (+)   Anesthesia Other Findings   Reproductive/Obstetrics negative OB ROS                           Anesthesia Physical Anesthesia Plan  ASA: II  Anesthesia Plan: General   Post-op Pain Management:    Induction: Intravenous  Airway Management Planned: Oral ETT  Additional Equipment:   Intra-op Plan:   Post-operative Plan: Extubation in OR  Informed Consent: I have reviewed the patients History and Physical, chart, labs and discussed the procedure including the risks, benefits and alternatives for the proposed anesthesia with the patient or authorized representative who has indicated his/her understanding and acceptance.   Dental advisory given  Plan Discussed with: CRNA  Anesthesia Plan Comments:         Anesthesia Quick Evaluation

## 2012-11-01 NOTE — Anesthesia Postprocedure Evaluation (Signed)
  Anesthesia Post-op Note  Patient: Haley Wade  Procedure(s) Performed: Procedure(s) (LRB): LAPAROSCOPY DIAGNOSTIC (N/A) EXPLORATORY LAPAROTOMY (N/A) LYSIS OF ADHESION (N/A) SMALL BOWEL RESECTION (N/A)  Patient Location: PACU  Anesthesia Type: General  Level of Consciousness: awake and alert   Airway and Oxygen Therapy: Patient Spontanous Breathing  Post-op Pain: mild  Post-op Assessment: Post-op Vital signs reviewed, Patient's Cardiovascular Status Stable, Respiratory Function Stable, Patent Airway and No signs of Nausea or vomiting  Last Vitals:  Filed Vitals:   11/01/12 1546  BP: 136/77  Pulse: 82  Temp: 36.9 C  Resp: 14    Post-op Vital Signs: stable   Complications: No apparent anesthesia complications

## 2012-11-01 NOTE — Brief Op Note (Signed)
10/29/2012 - 11/01/2012  2:43 PM  PATIENT:  Haley Wade  67 y.o. female  PRE-OPERATIVE DIAGNOSIS:  small bowel obstruction, abdominal pain  POST-OPERATIVE DIAGNOSIS:  Closed loop small bowel obstruction  PROCEDURE:  Procedure(s): LAPAROSCOPY DIAGNOSTIC (N/A) EXPLORATORY LAPAROTOMY (N/A) LYSIS OF ADHESION (N/A) SMALL BOWEL RESECTION (N/A)  SURGEON:  Surgeon(s) and Role:    * [name], MD - Primary    * Ardeth Sportsman, MD - Assisting  ANESTHESIA:   general  EBL:  Total I/O In: 1000 [I.V.:1000] Out: 275 [Urine:225; Blood:50]  BLOOD ADMINISTERED:none  DRAINS: none   LOCAL MEDICATIONS USED:  NONE  SPECIMEN:  Excision  DISPOSITION OF SPECIMEN:  PATHOLOGY  COUNTS:  YES  TOURNIQUET:  * No tourniquets in log *  DICTATION: .Other Dictation: Dictation Number [id number]  PLAN OF CARE: Admit to inpatient   PATIENT DISPOSITION:  PACU - hemodynamically stable.   Delay start of Pharmacological VTE agent (>24hrs) due to surgical blood loss or risk of bleeding: yes  [name], MD, [facility] Office: [phone]

## 2012-11-01 NOTE — Progress Notes (Signed)
Pt was complaining of nausea and had one active vomiting episode. Gave pt IV zofran, and irrigated NG tube as output had some particles that seemed to be clogging tube. Pt tolerated procedure and now feels less nauseas. Will continue to monitor.

## 2012-11-01 NOTE — Progress Notes (Signed)
Subjective: f/Up SBO, Hyponatremia, Leukocytosis, Ab Pain. NGT in place. NPO. 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 vrs Adhesions.   Been up walking. Slowly improving until last night when she had increased Nausea - Zofran/Ativan/Phenergan given.  Objective: Vital signs in last 24 hours: Temp:  [99 F (37.2 C)-99.7 F (37.6 C)] 99 F (37.2 C) (03/21 0537) Pulse Rate:  [79-88] 88 (03/21 0537) Resp:  [16] 16 (03/21 0537) BP: (125-176)/(62-74) 160/74 mmHg (03/21 0537) SpO2:  [96 %-98 %] 96 % (03/21 0537) Weight change:  Last BM Date: 10/30/12  CBG (last 3)  No results found for this basename: GLUCAP,  in the last 72 hours  Intake/Output from previous day:  Intake/Output Summary (Last 24 hours) at 11/01/12 0736 Last data filed at 10/31/12 1758  Gross per 24 hour  Intake      0 ml  Output    550 ml  Net   -550 ml   03/20 0701 - 03/21 0700 In: 0  Out: 550 [Urine:150; Emesis/NG output:400]   Physical Exam  General appearance: A and O.  Looks worse. Eyes: no scleral icterus Nare - NGT in place. Throat: Dry Resp: CTA B Cardio: Reg min m GI: Bloated, Mild BS, mild Tender (worse RUQ). Extremities: no clubbing, cyanosis or edema   Lab Results:  Recent Labs  10/30/12 0650 10/31/12 0355  NA 130* 136  K 4.3 3.6  CL 97 102  CO2 25 25  GLUCOSE 119* 129*  BUN 15 18  CREATININE 0.56 0.61  CALCIUM 8.4 8.5     Recent Labs  10/29/12 2345 10/30/12 0650  AST 20 13  ALT 24 19  ALKPHOS 52 46  BILITOT 0.4 0.3  PROT 7.4 6.3  ALBUMIN 3.7 3.0*     Recent Labs  10/29/12 2345 10/30/12 0650 10/31/12 0355  WBC 15.7* 9.8 10.8*  NEUTROABS 13.1* 8.5*  --   HGB 13.1 12.3 11.9*  HCT 38.7 36.2 34.9*  MCV 83.9 84.2 84.9  PLT 242 241 244    No results found for this basename: INR,  PROTIME    No results found for this basename: CKTOTAL, CKMB, CKMBINDEX,  TROPONINI,  in the last 72 hours  No results found for this basename: TSH, T4TOTAL, FREET3, T3FREE, THYROIDAB,  in the last 72 hours  No results found for this basename: VITAMINB12, FOLATE, FERRITIN, TIBC, IRON, RETICCTPCT,  in the last 72 hours  Micro Results: No results found for this or any previous visit (from the past 240 hour(s)).   Studies/Results: Dg Abd 1 View  10/30/2012  *RADIOLOGY REPORT*  Clinical Data: Partial small bowel obstruction.  Vomiting. Abdominal pain and nausea.  ABDOMEN - 1 VIEW  Comparison: CT scan dated 10/02/2012 and radiographs dated 10/01/2012  Findings: NG tube in the fundus of the stomach.  There are no dilated loops of large or small bowel.  There is stool scattered throughout the nondistended colon.  The bladder is filled with contrast.  There appears to be a cystocele.  IMPRESSION: No visible dilated bile loops.  Cystocele.   Original Report Authenticated By: Francene Boyers, M.D.    Dg Abd 2 Views  10/31/2012  *RADIOLOGY REPORT*  Clinical Data: Partial small bowel obstruction, follow  ABDOMEN - 2 VIEW  Comparison: Abdomen films of 10/30/2012  Findings: There is small bowel dilatation particularly in the left abdomen with air-fluid levels consistent with a persistent partial  small bowel obstruction.  An NG tube is noted with the tip in the proximal body of the stomach.  Surgical clips overlie the right upper quadrant from prior cholecystectomy.  IMPRESSION: Persistently dilated small bowel loop in the left abdomen with air- fluid levels consistent with persistent partial small bowel obstruction.   Original Report Authenticated By: Dwyane Dee, M.D.      Medications: Scheduled: . acyclovir  200 mg Intravenous Daily  . methylPREDNISolone (SOLU-MEDROL) injection  80 mg Intravenous Q12H   Continuous: . sodium chloride 75 mL/hr at 11/01/12 0133     Assessment/Plan: Principal Problem:   SBO (small bowel obstruction) Active Problems:   RHINOSINUSITIS,  CHRONIC   Hyponatremia   1. Small bowel obstruction - transition point is at the jejunum. Had been Improving clinically until last night and had a bad night and am.  Discussed with Dr Gerrit Friends.  Film still shows persistently dilated small bowel loop in the left abdomen with air- fluid levels consistent with persistent partial small bowel obstruction. The CT had some concern for possible inflammatory process like Crohn's disease but more probable is adhesions from prior surgeries or just a significant Viral Gastroenteritis. I ordered another film for this am.  Appreciate Dr Gerrit Friends and being Rxed conservatively. Will hold on GI consult unless surgery wants me to call them - May need capsule endo as outpt.  Continue n.p.o. with continuous NG tube suction. Pain relief medications, Antiemetics, and IV hydration.  She had GI W/Up in Jan at [facility]. 2. Hyponatremia - resolved and secondary to dehydration and spironolactone.  3. History of herpes genitalis -  Zovirax IV - only one more dose. 4. Anxiety -benzodiazepine to = 0.25 mg Ativan IV Q8 when necessary for anxiety. Usually uses 0.5 2 tabs daily and 1 HS. 5. History of allergic rhinitis - continue Flonase. 6. Leukocytosis - Better.. 7. Moderate MR - Released per Dr Garnette Scheuermann 8. Meniere's - NTD - Hold the Aldactone. 9.  Mult UTIs per Dr Logan Bores her Uro. On Flomax per him 10. Continue walking - she is improving clinically.  Hopefully NGT can be removed soon and she may start eating and transition home over the weekend.  The KUB results though are partially concerning. 11. SCDs   LOS: 3 days   Kasen Sako M 11/01/2012, 7:36 AM

## 2012-11-01 NOTE — Progress Notes (Signed)
Pt came up from OR. Pt is in NAD and is complaining of some abdominal soreness. Started PCA, VS are stable, and will continue to monitor.

## 2012-11-01 NOTE — Preoperative (Signed)
Beta Blockers   Reason not to administer Beta Blockers:Not Applicable 

## 2012-11-01 NOTE — Progress Notes (Signed)
General Surgery [facility]  AXR with no improvement.  Discussed with patient.  Plan to proceed to OR for laparoscopy, possible laparotomy.  The risks and benefits of the procedure have been discussed at length with the patient.  The patient understands the proposed procedure, potential alternative treatments, and the course of recovery to be expected.  All of the patient's questions have been answered at this time.  The patient wishes to proceed with surgery.  [name], MD, [facility] Office: [phone]

## 2012-11-01 NOTE — Progress Notes (Signed)
Patient ID: Haley Wade, female   DOB: [birth year], 67 y.o.   MRN: [id number]  General Surgery - [facility] - Progress Note  Subjective: Patient with more pain this AM.  Nauseated, dry heaves this AM.  Objective: Vital signs in last 24 hours: Temp:  [99 F (37.2 C)-99.7 F (37.6 C)] 99 F (37.2 C) (03/21 0537) Pulse Rate:  [79-88] 88 (03/21 0537) Resp:  [16] 16 (03/21 0537) BP: (125-176)/(62-74) 160/74 mmHg (03/21 0537) SpO2:  [96 %-98 %] 96 % (03/21 0537) Last BM Date: 10/30/12  Intake/Output from previous day: 03/20 0701 - 03/21 0700 In: 0  Out: 550 [Urine:150; Emesis/NG output:400]  Exam: HEENT - clear, not icteric Neck - soft Chest - clear bilaterally Cor - RRR, no murmur Abd - mild distension, diffusely tender; active BS Ext - no significant edema Neuro - grossly intact, no focal deficits  Lab Results:   Recent Labs  10/30/12 0650 10/31/12 0355  WBC 9.8 10.8*  HGB 12.3 11.9*  HCT 36.2 34.9*  PLT 241 244     Recent Labs  10/30/12 0650 10/31/12 0355  NA 130* 136  K 4.3 3.6  CL 97 102  CO2 25 25  GLUCOSE 119* 129*  BUN 15 18  CREATININE 0.56 0.61  CALCIUM 8.4 8.5    Studies/Results: Dg Abd 1 View  10/30/2012  *RADIOLOGY REPORT*  Clinical Data: Partial small bowel obstruction.  Vomiting. Abdominal pain and nausea.  ABDOMEN - 1 VIEW  Comparison: CT scan dated 10/02/2012 and radiographs dated 10/01/2012  Findings: NG tube in the fundus of the stomach.  There are no dilated loops of large or small bowel.  There is stool scattered throughout the nondistended colon.  The bladder is filled with contrast.  There appears to be a cystocele.  IMPRESSION: No visible dilated bile loops.  Cystocele.   Original Report Authenticated By: Francene Boyers, M.D.    Dg Abd 2 Views  10/31/2012  *RADIOLOGY REPORT*  Clinical Data: Partial small bowel obstruction, follow  ABDOMEN - 2 VIEW  Comparison: Abdomen films of 10/30/2012  Findings: There is  small bowel dilatation particularly in the left abdomen with air-fluid levels consistent with a persistent partial small bowel obstruction.  An NG tube is noted with the tip in the proximal body of the stomach.  Surgical clips overlie the right upper quadrant from prior cholecystectomy.  IMPRESSION: Persistently dilated small bowel loop in the left abdomen with air- fluid levels consistent with persistent partial small bowel obstruction.   Original Report Authenticated By: Dwyane Dee, M.D.     Assessment / Plan: 1.  Abdominal pain - partial SBO - ? Etiology  Check abdominal x-ray this AM  May take to OR today for laparoscopy / laparotomy if no improvement  Discussed with patient and with Dr. Timothy Lasso this AM  [name], MD, [facility] Office: [phone]  11/01/2012

## 2012-11-01 NOTE — Anesthesia Procedure Notes (Signed)
Procedure Name: Intubation Date/Time: 11/01/2012 1:10 PM Performed by: Leroy Libman L Patient Re-evaluated:Patient Re-evaluated prior to inductionOxygen Delivery Method: Circle system utilized Preoxygenation: Pre-oxygenation with 100% oxygen Intubation Type: IV induction, Cricoid Pressure applied and Rapid sequence Laryngoscope Size: Miller and 3 Grade View: Grade II Tube type: Oral Tube size: 7.5 mm Number of attempts: 2 Airway Equipment and Method: Stylet Placement Confirmation: ETT inserted through vocal cords under direct vision,  breath sounds checked- equal and bilateral and positive ETCO2 Secured at: 20 cm Tube secured with: Tape Dental Injury: Teeth and Oropharynx as per pre-operative assessment

## 2012-11-01 NOTE — Progress Notes (Signed)
Pt was able to dangle legs on the side of the bed today. She is reporting that her pain is within tolerable limits and is complaining of no nausea. Will continue to monitor.

## 2012-11-02 LAB — CBC
HCT: 39.4 % (ref 36.0–46.0)
Hemoglobin: 13.2 g/dL (ref 12.0–15.0)
MCH: 28.4 pg (ref 26.0–34.0)
MCHC: 33.5 g/dL (ref 30.0–36.0)
MCV: 84.9 fL (ref 78.0–100.0)
Platelets: 246 10*3/uL (ref 150–400)
RBC: 4.64 MIL/uL (ref 3.87–5.11)
RDW: 13.5 % (ref 11.5–15.5)
WBC: 14.2 10*3/uL — ABNORMAL HIGH (ref 4.0–10.5)

## 2012-11-02 LAB — BASIC METABOLIC PANEL
BUN: 19 mg/dL (ref 6–23)
CO2: 23 mEq/L (ref 19–32)
Calcium: 7.7 mg/dL — ABNORMAL LOW (ref 8.4–10.5)
Chloride: 101 mEq/L (ref 96–112)
Creatinine, Ser: 0.53 mg/dL (ref 0.50–1.10)
GFR calc Af Amer: >90 mL/min (ref >90)
GFR calc non Af Amer: >90 mL/min (ref >90)
Glucose, Bld: 185 mg/dL — ABNORMAL HIGH (ref 70–99)
Potassium: 4.5 mEq/L (ref 3.5–5.1)
Sodium: 132 mEq/L — ABNORMAL LOW (ref 135–145)

## 2012-11-02 MED ORDER — DEXTROSE 5 % IV SOLN
200.0000 mg | Freq: Every day | INTRAVENOUS | Status: DC
  Administered 2012-11-02 – 2012-11-06 (×5): 200 mg via INTRAVENOUS
  Filled 2012-11-02 (×6): qty 4

## 2012-11-02 MED ORDER — CHLORHEXIDINE GLUCONATE 0.12 % MT SOLN
15.0000 mL | Freq: Two times a day (BID) | OROMUCOSAL | Status: DC
  Administered 2012-11-03 – 2012-11-08 (×8): 15 mL via OROMUCOSAL
  Filled 2012-11-02 (×13): qty 15

## 2012-11-02 MED ORDER — BIOTENE DRY MOUTH MT LIQD
15.0000 mL | Freq: Two times a day (BID) | OROMUCOSAL | Status: DC
  Administered 2012-11-03 – 2012-11-07 (×9): 15 mL via OROMUCOSAL

## 2012-11-02 NOTE — Progress Notes (Signed)
MEDICATION RELATED CONSULT NOTE - INITIAL   Pharmacy Consult for Acyclovir  Allergies  Allergen Reactions  . Bee Venom Swelling    Patient Measurements: Height: 5' (152.4 cm) Weight: 101 lb 3.2 oz (45.904 kg) IBW/kg (Calculated) : 45.5  Labs:  Recent Labs  10/31/12 0355 11/01/12 1651 11/02/12 0412  WBC 10.8* 11.8* 14.2*  HGB 11.9* 12.8 13.2  HCT 34.9* 38.7 39.4  PLT 244 255 246  CREATININE 0.61 0.53 0.53   Estimated Creatinine Clearance: 50.4 ml/min (by C-G formula based on Cr of 0.53).  Microbiology: No results found for this or any previous visit (from the past 720 hour(s)).  Medical History: Past Medical History  Diagnosis Date  . Allergic rhinitis, cause unspecified   . Other chronic sinusitis   . Personal history of unspecified circulatory disease    Assessment: 67 YOF admitted with SBO now s/p surgery (POD#1). Patient has a history of herpes genitalis for which she chronically takes acyclovir 200 mg PO daily for suppression therapy.  Dr. Evlyn Kanner asked pharmacy to convert to IV formulation while patient NPO.  Patient has already received acyclovir 200 mg IV daily for the past 3 days.  Plan:  Acyclovir 200 mg IV daily.  Clance Boll 11/02/2012,4:07 PM

## 2012-11-02 NOTE — Progress Notes (Signed)
Subjective: Doing well post-op. Pain is controlled. In good spirits. No other issues noted   Objective: Vital signs in last 24 hours: Temp:  [97.7 F (36.5 C)-100.1 F (37.8 C)] 97.7 F (36.5 C) (03/22 0521) Pulse Rate:  [82-91] 85 (03/22 0521) Resp:  [12-20] 20 (03/22 0750) BP: (126-167)/(65-94) 146/83 mmHg (03/22 0521) SpO2:  [96 %-99 %] 97 % (03/22 0750)  Intake/Output from previous day: 03/21 0701 - 03/22 0700 In: 2300 [I.V.:2300] Out: 565 [Urine:515; Blood:50] Intake/Output this shift: Total I/O In: 100 [I.V.:100] Out: -   General: alert looks remarkably well NGT in place lungs clear no wheeze, ht regular. abd bandaged extrems strong pulses no edema. Awake alert mentating well  Lab Results   Recent Labs  11/01/12 1651 11/02/12 0412  WBC 11.8* 14.2*  RBC 4.51 4.64  HGB 12.8 13.2  HCT 38.7 39.4  MCV 85.8 84.9  MCH 28.4 28.4  RDW 13.1 13.5  PLT 255 246    Recent Labs  10/31/12 0355 11/01/12 1651 11/02/12 0412  NA 136  --  132*  K 3.6  --  4.5  CL 102  --  101  CO2 25  --  23  GLUCOSE 129*  --  185*  BUN 18  --  19  CREATININE 0.61 0.53 0.53  CALCIUM 8.5  --  7.7*    Studies/Results: Dg Abd 1 View  11/01/2012  *RADIOLOGY REPORT*  Clinical Data: 67 year old female with nausea.  Small bowel obstruction.  ABDOMEN - 1 VIEW  Comparison: 10/31/2012 and earlier.  Findings: NG tube remains in place but is been pulled back, the side holes at the level of the distal thoracic esophagus.  Small bilateral pleural effusions.  No definite pneumoperitoneum on these two supine views.  Stable right upper quadrant surgical clips.  Bowel gas pattern not significantly changed.  Continued paucity of large bowel gas, but gas filled small bowel loops in the left abdomen are only mildly dilated up to 33 mm diameter. Stable visualized osseous structures.  IMPRESSION: 1.  Advance NG tube 7 cm to place the side-hole within the stomach. 2.  Stable bowel gas pattern, suggestive of  partial small bowel obstruction. 3.  Small bilateral pleural effusions.   Original Report Authenticated By: Erskine Speed, M.D.     Scheduled Meds: . enoxaparin (LOVENOX) injection  40 mg Subcutaneous Q24H  . ertapenem (INVANZ) IV  1 g Intravenous Q24H  . HYDROmorphone PCA 0.3 mg/mL   Intravenous Q4H  . methylPREDNISolone (SOLU-MEDROL) injection  80 mg Intravenous Q12H  . pantoprazole (PROTONIX) IV  40 mg Intravenous Q24H   Continuous Infusions: . dextrose 5 % and 0.45 % NaCl with KCl 20 mEq/L 100 mL/hr at 11/02/12 0750   PRN Meds:acetaminophen, acetaminophen, diphenhydrAMINE, diphenhydrAMINE, fluticasone, HYDROmorphone (DILAUDID) injection, LORazepam, naloxone, ondansetron (ZOFRAN) IV, ondansetron (ZOFRAN) IV, ondansetron, promethazine, promethazine, sodium chloride  Assessment/Plan:     1. Small bowel obstruction now S/P surgery. To be managed by surgical team  2. Hyponatremia - minor at 132  3. History of herpes genitalis - Zovirax IV - only one more dose. 4. Anxiety -benzodiazepine to = 0.25 mg Ativan IV Q8 when necessary for anxiety. Usually uses 0.5 2 tabs daily and 1 HS.  WILL SIGN OFF NOW THAT SURGERY HAS OCCURRED. CALL IF WE CAN HELP FURTHER Dr Timothy Lasso will check back MON if she is still here  LOS: 4 days   Haley Wade ALAN 11/02/2012, 11:13 AM

## 2012-11-02 NOTE — Op Note (Signed)
NAMERUBYANN, Wade             ACCOUNT NO.:  [id number]  MEDICAL RECORD NO.:  [id number]  LOCATION:  1307                         FACILITY:  [facility]  PHYSICIAN:  Haley Heckler, MD      DATE OF BIRTH:  07/10/1946  DATE OF PROCEDURE:  11/01/2012                              OPERATIVE REPORT   PREOPERATIVE DIAGNOSES:  Small bowel obstruction, abdominal pain.  POSTOPERATIVE DIAGNOSIS:  Closed loop small bowel obstruction.  PROCEDURE: 1. Diagnostic laparoscopy. 2. Exploratory laparotomy. 3. Small bowel resection.  SURGEON:  Haley Heckler, MD, FACS  ASSISTANT:  Haley Sportsman, MD  ANESTHESIA:  General.  ESTIMATED BLOOD LOSS:  Minimal.  PREPARATION:  ChloraPrep.  COMPLICATIONS:  None.  INDICATIONS:  The patient is a 67 year old white female admitted to the Medical Service with abdominal pain, nausea, and vomiting.  Evaluation included CT scan and plain abdominal x-rays consistent with partial small bowel obstruction.  The patient had persistent nausea, vomiting, and pain.  Decision was made to proceed with diagnostic laparoscopy.  BODY OF REPORT:  Procedures done in OR #6 at the [facility].  The patient was brought to the operating room, placed in supine position on the operating room table.  Following administration of general anesthesia, the patient was positioned and then prepped and draped in the usual strict aseptic fashion.  After ascertaining that an adequate level of anesthesia been achieved, an incision was made at the left costal margin.  Using a 5 mm trocar and the Optiview scope, the laparoscope was introduced into the peritoneal cavity safely. Pneumoperitoneum was established.  An operating port was placed in the left lower quadrant under direct vision.  Using the [facility] clamp, the abdomen was gently explored.  There were dilated loops of small bowel. There were adhesions in the pelvis.  There is an ischemic loop of bowel in the right  lower quadrant.  Upon mobilization, there is perforation of this loop of small bowel, with spillage of succus entericus into the peritoneal cavity.  At this point, the procedure was converted from laparoscopy to open surgery.  After resetting the field, a midline abdominal incision was made with a #10 blade.  Dissection was carried through subcutaneous tissues.  Fascia was incised in the midline and the peritoneal cavity was entered cautiously.  Ascites fluid was evacuated from the peritoneal cavity. Abdomen was explored.  There was a closed loop obstruction in the right lower quadrant.  There was a band from the retroperitoneum to the posterior aspect of the cecum.  This was lysed sharply with the Metzenbaum scissors allowing for delivery of the loops of small bowel into the wound.  The perforation was closed with a figure-of-eight silk suture.  Remaining adhesions were lysed allowing for delivery of the entire small bowel through the midline incision.  The small bowel was run from the ligament of Treitz to the ileocecal valve.  There was proximal dilatation.  The closed loop obstruction was in the proximal ileum.  There are 2 areas across the surface of the bowel where it appears that a tight band had obstructed the 10-12 cm segment of small bowel.  Distally, the small bowel appears grossly normal  through the terminal ileum and into the cecum.  There was no evidence of inflammatory bowel disease.  There was no evidence of malignancy.  Decision was made to resect the area of small bowel which had been ischemic and where the perforation had occurred.  The small bowel was transected proximally and distally to the segment with a GIA stapler. The mesentery was divided between Pabellones clamps and ligated with 2-0 silk ties.  Specimen was passed off the field and submitted to Pathology for review.  Next, a side-to-side functionally end-to-end anastomosis was created in the proximal ileum.   This was performed with a GIA stapler.  Enterotomy was closed with interrupted 3-0 silk sutures.  Mesenteric defect was closed with interrupted 2-0 silk sutures.  Good hemostasis was noted. The bowel was returned to the peritoneal cavity.  Abdomen was copiously irrigated with warm saline which was evacuated.  Nasogastric tube was advanced into proper position within the stomach.  Seprafilm was placed over the small bowel as there is very little omentum to cover the small bowel at the conclusion of the procedure.  Midline incision was closed with interrupted #1 Novafil simple sutures.  Subcutaneous tissues were irrigated.  Skin was closed with stainless steel staples.  Laparoscopy sites were closed with stainless steel staples.  Sterile dressings were applied.  The patient was awakened from anesthesia and brought to the recovery room.  The patient tolerated the procedure well.   Haley Heckler, MD, [facility] Office: [phone]   TMG/MEDQ  D:  11/01/2012  T:  11/02/2012  Job:  [id number]  cc:   Haley Pounds, MD Fax: [phone]

## 2012-11-02 NOTE — Progress Notes (Signed)
Patient ID: Haley Wade, female   DOB: 10/23/1945, 67 y.o.   MRN: [id number] 1 Day Post-Op  Subjective: Uncomfortable but pain meds working OK.  Wants to get out of bed  Objective: Vital signs in last 24 hours: Temp:  [97.7 F (36.5 C)-100.1 F (37.8 C)] 97.7 F (36.5 C) (03/22 0521) Pulse Rate:  [82-91] 85 (03/22 0521) Resp:  [12-20] 20 (03/22 0750) BP: (126-167)/(65-94) 146/83 mmHg (03/22 0521) SpO2:  [96 %-99 %] 97 % (03/22 0750) Last BM Date: 10/30/12  Intake/Output from previous day: 03/21 0701 - 03/22 0700 In: 2300 [I.V.:2300] Out: 565 [Urine:515; Blood:50] Intake/Output this shift: Total I/O In: 100 [I.V.:100] Out: -   General appearance: alert, cooperative and no distress Resp: clear to auscultation bilaterally GI: Moderate diffuse tenderness right greater than left Incision/Wound: Dressing clean and dry  Lab Results:   Recent Labs  11/01/12 1651 11/02/12 0412  WBC 11.8* 14.2*  HGB 12.8 13.2  HCT 38.7 39.4  PLT 255 246   BMET  Recent Labs  10/31/12 0355 11/01/12 1651 11/02/12 0412  NA 136  --  132*  K 3.6  --  4.5  CL 102  --  101  CO2 25  --  23  GLUCOSE 129*  --  185*  BUN 18  --  19  CREATININE 0.61 0.53 0.53  CALCIUM 8.5  --  7.7*     Studies/Results: Dg Abd 1 View  11/01/2012  *RADIOLOGY REPORT*  Clinical Data: 67 year old female with nausea.  Small bowel obstruction.  ABDOMEN - 1 VIEW  Comparison: 10/31/2012 and earlier.  Findings: NG tube remains in place but is been pulled back, the side holes at the level of the distal thoracic esophagus.  Small bilateral pleural effusions.  No definite pneumoperitoneum on these two supine views.  Stable right upper quadrant surgical clips.  Bowel gas pattern not significantly changed.  Continued paucity of large bowel gas, but gas filled small bowel loops in the left abdomen are only mildly dilated up to 33 mm diameter. Stable visualized osseous structures.  IMPRESSION: 1.  Advance NG tube 7 cm to  place the side-hole within the stomach. 2.  Stable bowel gas pattern, suggestive of partial small bowel obstruction. 3.  Small bilateral pleural effusions.   Original Report Authenticated By: Erskine Speed, M.D.     Anti-infectives: Anti-infectives   Start     Dose/Rate Route Frequency Ordered Stop   11/02/12 1200  ertapenem (INVANZ) 1 g in sodium chloride 0.9 % 50 mL IVPB     1 g 100 mL/hr over 30 Minutes Intravenous Every 24 hours 11/01/12 1614     11/01/12 1230  [MAR Hold]  ertapenem (INVANZ) 1 g in sodium chloride 0.9 % 50 mL IVPB     (On MAR Hold since 11/01/12 1241)   1 g 100 mL/hr over 30 Minutes Intravenous  Once 11/01/12 1147 11/01/12 1244   10/30/12 1000  acyclovir (ZOVIRAX) 200 mg in dextrose 5 % 100 mL IVPB     200 mg 104 mL/hr over 60 Minutes Intravenous Daily 10/30/12 0602 11/01/12 1031      Assessment/Plan: s/p Procedure(s): LAPAROSCOPY DIAGNOSTIC EXPLORATORY LAPAROTOMY LYSIS OF ADHESION SMALL BOWEL RESECTION Stable post op.  Ice chips po, cont NG for now Mobilize   LOS: 4 days    Tykel Badie T 11/02/2012

## 2012-11-03 LAB — BASIC METABOLIC PANEL
BUN: 11 mg/dL (ref 6–23)
CO2: 26 mEq/L (ref 19–32)
Calcium: 8.3 mg/dL — ABNORMAL LOW (ref 8.4–10.5)
Chloride: 99 mEq/L (ref 96–112)
Creatinine, Ser: 0.49 mg/dL — ABNORMAL LOW (ref 0.50–1.10)
GFR calc Af Amer: >90 mL/min (ref >90)
GFR calc non Af Amer: >90 mL/min (ref >90)
Glucose, Bld: 161 mg/dL — ABNORMAL HIGH (ref 70–99)
Potassium: 4.3 mEq/L (ref 3.5–5.1)
Sodium: 132 mEq/L — ABNORMAL LOW (ref 135–145)

## 2012-11-03 LAB — CBC
HCT: 33.1 % — ABNORMAL LOW (ref 36.0–46.0)
Hemoglobin: 11.2 g/dL — ABNORMAL LOW (ref 12.0–15.0)
MCH: 28.6 pg (ref 26.0–34.0)
MCHC: 33.8 g/dL (ref 30.0–36.0)
MCV: 84.4 fL (ref 78.0–100.0)
Platelets: 201 10*3/uL (ref 150–400)
RBC: 3.92 MIL/uL (ref 3.87–5.11)
RDW: 13.2 % (ref 11.5–15.5)
WBC: 11.8 10*3/uL — ABNORMAL HIGH (ref 4.0–10.5)

## 2012-11-03 NOTE — Progress Notes (Signed)
Patient ambulated length of hallway with minimal assist.

## 2012-11-03 NOTE — Progress Notes (Signed)
1200 11/03/12 MD placed order for foley to be d/c'ed. Per patient she does not want to go through having the foley placed again later. On 03/22 an order was placed for foley to be d/c'ed, foley was removed but patient had not voided within six hours, new foley was placed that night.  Patient states she has to take flomax to help her void. Per patient she has not had her flomax since being admitted. Foley still in place.

## 2012-11-03 NOTE — Progress Notes (Signed)
Patient ID: Haley Wade, female   DOB: 1946/07/05, 67 y.o.   MRN: [id number] 2 Days Post-Op  Subjective: Feeling much better today. In good spirits. Denies abdominal pain or nausea. She has had no flatus or bowel movement yet.  Objective: Vital signs in last 24 hours: Temp:  [98.1 F (36.7 C)-99.1 F (37.3 C)] 98.4 F (36.9 C) (03/23 0605) Pulse Rate:  [88-103] 88 (03/23 0605) Resp:  [15-96] 20 (03/23 0800) BP: (129-152)/(67-80) 134/78 mmHg (03/23 0605) SpO2:  [95 %-98 %] 97 % (03/23 0800) Last BM Date: 10/31/12  Intake/Output from previous day: 03/22 0701 - 03/23 0700 In: 1400 [I.V.:1400] Out: 1650 [Urine:1100; Emesis/NG output:550] Intake/Output this shift: Total I/O In: 100 [I.V.:100] Out: -   General appearance: alert, cooperative and no distress Resp: clear to auscultation bilaterally GI: abnormal findings:  mild tenderness in the entire abdomen Incision/Wound: clean and dry without evidence of infection  Lab Results:   Recent Labs  11/02/12 0412 11/03/12 0414  WBC 14.2* 11.8*  HGB 13.2 11.2*  HCT 39.4 33.1*  PLT 246 201   BMET  Recent Labs  11/02/12 0412 11/03/12 0414  NA 132* 132*  K 4.5 4.3  CL 101 99  CO2 23 26  GLUCOSE 185* 161*  BUN 19 11  CREATININE 0.53 0.49*  CALCIUM 7.7* 8.3*     Studies/Results: No results found.  Anti-infectives: Anti-infectives   Start     Dose/Rate Route Frequency Ordered Stop   11/02/12 1500  acyclovir (ZOVIRAX) 200 mg in dextrose 5 % 100 mL IVPB     200 mg 104 mL/hr over 60 Minutes Intravenous Daily 11/02/12 1336     11/02/12 1200  ertapenem (INVANZ) 1 g in sodium chloride 0.9 % 50 mL IVPB     1 g 100 mL/hr over 30 Minutes Intravenous Every 24 hours 11/01/12 1614     11/01/12 1230  [MAR Hold]  ertapenem (INVANZ) 1 g in sodium chloride 0.9 % 50 mL IVPB     (On MAR Hold since 11/01/12 1241)   1 g 100 mL/hr over 30 Minutes Intravenous  Once 11/01/12 1147 11/01/12 1244   10/30/12 1000  acyclovir  (ZOVIRAX) 200 mg in dextrose 5 % 100 mL IVPB     200 mg 104 mL/hr over 60 Minutes Intravenous Daily 10/30/12 0602 11/01/12 1031      Assessment/Plan: s/p Procedure(s): LAPAROSCOPY DIAGNOSTIC EXPLORATORY LAPAROTOMY LYSIS OF ADHESION SMALL BOWEL RESECTION Doing well without complication identified. Has not yet had return of bowel function. Continue NG today. Increase activity.   LOS: 5 days    Haley Wade T 11/03/2012

## 2012-11-03 NOTE — Progress Notes (Signed)
2956 11/03/12 Patient NG secretions have changed from brownish green color to red. Page Dr Johna Sheriff awaiting response.

## 2012-11-03 NOTE — Progress Notes (Signed)
Patient had not voided since catheter was removed this earlier this afternoon. She states she usually has difficulty voiding for many years and uses flomax to assist her in voiding. She is NPO after her bowel surgery, so MD on call made aware, and an order was received to reinsert her foley. The rounding MD is to re-evaluate the foley in the am.

## 2012-11-03 NOTE — Progress Notes (Signed)
Subjective: Had a good night. Pain is under control. No nausea. No flatus or BM yet. Breathing well. NGT still in place. Had urinary issues and needed foley replaced. Takes flomax at home Several calls from nursing yesterday Objective: Vital signs in last 24 hours: Temp:  [98.1 F (36.7 C)-99.1 F (37.3 C)] 98.4 F (36.9 C) (03/23 0605) Pulse Rate:  [88-103] 88 (03/23 0605) Resp:  [15-96] 20 (03/23 0800) BP: (129-152)/(67-80) 134/78 mmHg (03/23 0605) SpO2:  [95 %-98 %] 97 % (03/23 0800)  Intake/Output from previous day: 03/22 0701 - 03/23 0700 In: 1400 [I.V.:1400] Out: 1650 [Urine:1100; Emesis/NG output:550] Intake/Output this shift: Total I/O In: 100 [I.V.:100] Out: -   General: alert, sitting up in no distress. Lungs clear. Ht regular . abd is fairly quiet Awake, alert mentating well  Lab Results   Recent Labs  11/02/12 0412 11/03/12 0414  WBC 14.2* 11.8*  RBC 4.64 3.92  HGB 13.2 11.2*  HCT 39.4 33.1*  MCV 84.9 84.4  MCH 28.4 28.6  RDW 13.5 13.2  PLT 246 201    Recent Labs  11/02/12 0412 11/03/12 0414  NA 132* 132*  K 4.5 4.3  CL 101 99  CO2 23 26  GLUCOSE 185* 161*  BUN 19 11  CREATININE 0.53 0.49*  CALCIUM 7.7* 8.3*    Studies/Results: No results found.  Scheduled Meds: . acyclovir  200 mg Intravenous Daily  . antiseptic oral rinse  15 mL Mouth Rinse q12n4p  . chlorhexidine  15 mL Mouth Rinse BID  . enoxaparin (LOVENOX) injection  40 mg Subcutaneous Q24H  . ertapenem (INVANZ) IV  1 g Intravenous Q24H  . HYDROmorphone PCA 0.3 mg/mL   Intravenous Q4H  . methylPREDNISolone (SOLU-MEDROL) injection  80 mg Intravenous Q12H  . pantoprazole (PROTONIX) IV  40 mg Intravenous Q24H   Continuous Infusions: . dextrose 5 % and 0.45 % NaCl with KCl 20 mEq/L 100 mL/hr at 11/03/12 0700   PRN Meds:acetaminophen, acetaminophen, diphenhydrAMINE, diphenhydrAMINE, fluticasone, HYDROmorphone (DILAUDID) injection, LORazepam, naloxone, ondansetron (ZOFRAN) IV,  ondansetron (ZOFRAN) IV, ondansetron, promethazine, promethazine, sodium chloride  Assessment/Plan: 1.  Small bowel obstruction now S/P surgery. To be managed by surgical team  2. Hyponatremia - minor at 132  3. History of herpes genitalis - Zovirax IV -   4. Anxiety -benzodiazepine to = 0.25 mg Ativan IV Q8 when necessary for anxiety. Usually uses 0.5 2 tabs daily and 1 HS.    LOS: 5 days   Raaga Maeder ALAN 11/03/2012, 9:04 AM

## 2012-11-03 NOTE — Progress Notes (Signed)
1141 11/03/12 Patient NG secretions has changed from red to clear brownish color. Dr Johna Sheriff wanted [place] to monitor NG output and make sure patient was receiving IV protonix, next dose schedule for 2200 today.

## 2012-11-04 ENCOUNTER — Encounter (HOSPITAL_COMMUNITY): Payer: Self-pay | Admitting: Surgery

## 2012-11-04 MED ORDER — METHYLPREDNISOLONE SODIUM SUCC 40 MG IJ SOLR
40.0000 mg | Freq: Two times a day (BID) | INTRAMUSCULAR | Status: AC
  Administered 2012-11-04 (×2): 40 mg via INTRAVENOUS

## 2012-11-04 MED ORDER — KETOROLAC TROMETHAMINE 30 MG/ML IJ SOLN
30.0000 mg | Freq: Four times a day (QID) | INTRAMUSCULAR | Status: AC
  Administered 2012-11-04 – 2012-11-05 (×6): 30 mg via INTRAVENOUS
  Filled 2012-11-04 (×8): qty 1

## 2012-11-04 MED FILL — Bupivacaine Inj 0.25% w/ Epinephrine 1:200000 (PF): INTRAMUSCULAR | Qty: 30 | Status: AC

## 2012-11-04 NOTE — Progress Notes (Signed)
Still awaiting bowel function Ambulating - will get abdominal binder Add Toradol for pain control.  Wilmon Arms. Corliss Skains, MD, [facility]  11/04/2012 1:09 PM

## 2012-11-04 NOTE — Progress Notes (Addendum)
Subjective: Doing well post-op.  Pain is controlled.  In good spirits.  Walking. No BM or Flatus. No other issues noted Wants to shower. Nurse asking @Dressing  changes   Objective: Vital signs in last 24 hours: Temp:  [98.2 F (36.8 C)-98.8 F (37.1 C)] 98.7 F (37.1 C) (03/24 0542) Pulse Rate:  [77-90] 77 (03/24 0542) Resp:  [14-24] 21 (03/24 0542) BP: (128-144)/(73-81) 129/81 mmHg (03/24 0542) SpO2:  [96 %-98 %] 96 % (03/24 0542)  Intake/Output from previous day: 03/23 0701 - 03/24 0700 In: 1800 [I.V.:1700; IV Piggyback:100] Out: 1450 [Urine:1200; Emesis/NG output:250] Intake/Output this shift:    General: alert looks well,  NGT in place,  lungs clear no wheeze, ht regular. abd soft. Staplesin place, extremities strong pulses no edema. Awake alert mentating well  Lab Results   Recent Labs  11/02/12 0412 11/03/12 0414  WBC 14.2* 11.8*  RBC 4.64 3.92  HGB 13.2 11.2*  HCT 39.4 33.1*  MCV 84.9 84.4  MCH 28.4 28.6  RDW 13.5 13.2  PLT 246 201    Recent Labs  11/02/12 0412 11/03/12 0414  NA 132* 132*  K 4.5 4.3  CL 101 99  CO2 23 26  GLUCOSE 185* 161*  BUN 19 11  CREATININE 0.53 0.49*  CALCIUM 7.7* 8.3*    Studies/Results: No results found.  Scheduled Meds: . acyclovir  200 mg Intravenous Daily  . antiseptic oral rinse  15 mL Mouth Rinse q12n4p  . chlorhexidine  15 mL Mouth Rinse BID  . enoxaparin (LOVENOX) injection  40 mg Subcutaneous Q24H  . ertapenem (INVANZ) IV  1 g Intravenous Q24H  . HYDROmorphone PCA 0.3 mg/mL   Intravenous Q4H  . methylPREDNISolone (SOLU-MEDROL) injection  80 mg Intravenous Q12H  . pantoprazole (PROTONIX) IV  40 mg Intravenous Q24H   Continuous Infusions: . dextrose 5 % and 0.45 % NaCl with KCl 20 mEq/L 100 mL/hr at 11/03/12 1205   PRN Meds:acetaminophen, acetaminophen, diphenhydrAMINE, diphenhydrAMINE, fluticasone, HYDROmorphone (DILAUDID) injection, LORazepam, naloxone, ondansetron (ZOFRAN) IV, ondansetron (ZOFRAN) IV,  ondansetron, promethazine, promethazine, sodium chloride  Assessment/Plan:     1. Small bowel obstruction S/P surgery per gen surgical team. I keep trying to change attending to Surgical team/Dr gerkin and it keeps defaulting back to me. NGT/POs, IV pain meds, Foley, O2 all up to Surgery. 2. Hyponatremia - minor at 132. NTD  3. History of herpes genitalis - on Oral suppression - using Zovirax IV here. 4. Anxiety -benzodiazepine to = 0.25 mg Ativan IV Q8 when necessary for anxiety. Usually uses 0.5 2 tabs daily and 1 HS. 5. She is afraid of D/Cing her foley until she has been restarted on PO Flomax. 6. I noted Solumedrol on her MAR and could not figure out why - it was started 10/30/12 for ??.  Will Wean.   LOS: 6 days   Haley Wade 11/04/2012, 7:27 AM

## 2012-11-04 NOTE — Progress Notes (Addendum)
Pt ambulated in hall this morning. She walked the entirety of the east side and back to her room. No SOB, dyspnea, or distress noted. Will continue to monitor.   1400: Pt ambulated down entire east and west side. No SOB, dyspnea, or distress noted. Will continue to monitor.

## 2012-11-04 NOTE — Care Management (Signed)
Cm spoke with patient at bedside concerning discharge planning. Per pt lives in [place] community with very supportive neighbors whom have keys to her residence if she requires assistance. Pt states having adult daughter who will assist in home care and provide tx home. Pt states using pharmacy in [facility] that's mails her rx. No needs stated or requested at this time.   Roxy Manns Jaystin Mcgarvey,RN,BSN [phone]

## 2012-11-04 NOTE — Progress Notes (Signed)
3 Days Post-Op  Subjective: Walking the hall with assist PCA No flatus or BM Feels better  Objective: Vital signs in last 24 hours: Temp:  [98.2 F (36.8 C)-98.8 F (37.1 C)] 98.7 F (37.1 C) (03/24 0542) Pulse Rate:  [77-90] 77 (03/24 0542) Resp:  [14-24] 14 (03/24 0754) BP: (128-135)/(73-81) 129/81 mmHg (03/24 0542) SpO2:  [95 %-98 %] 95 % (03/24 0754) Last BM Date: 10/31/12  NPO x ice chips, foley in for bladder issues, and plan to keep it till she can go back on flowmax, 250 from the NG, No flatus/BM. Afebrile, VSS, WBC is up some,   Intake/Output from previous day: 03/23 0701 - 03/24 0700 In: 1800 [I.V.:1700; IV Piggyback:100] Out: 1450 [Urine:1200; Emesis/NG output:250] Intake/Output this shift: Total I/O In: -  Out: 350 [Urine:350]  General appearance: alert, cooperative and no distress GI: wound looks fine, rare BS, not much thru the Ng.  Lab Results:   Recent Labs  11/02/12 0412 11/03/12 0414  WBC 14.2* 11.8*  HGB 13.2 11.2*  HCT 39.4 33.1*  PLT 246 201    BMET  Recent Labs  11/02/12 0412 11/03/12 0414  NA 132* 132*  K 4.5 4.3  CL 101 99  CO2 23 26  GLUCOSE 185* 161*  BUN 19 11  CREATININE 0.53 0.49*  CALCIUM 7.7* 8.3*   PT/INR No results found for this basename: LABPROT, INR,  in the last 72 hours   Recent Labs Lab 10/29/12 2345 10/30/12 0650  AST 20 13  ALT 24 19  ALKPHOS 52 46  BILITOT 0.4 0.3  PROT 7.4 6.3  ALBUMIN 3.7 3.0*     Lipase     Component Value Date/Time   LIPASE 41 10/29/2012 2345     Studies/Results: No results found.  Medications: . acyclovir  200 mg Intravenous Daily  . antiseptic oral rinse  15 mL Mouth Rinse q12n4p  . chlorhexidine  15 mL Mouth Rinse BID  . enoxaparin (LOVENOX) injection  40 mg Subcutaneous Q24H  . ertapenem (INVANZ) IV  1 g Intravenous Q24H  . HYDROmorphone PCA 0.3 mg/mL   Intravenous Q4H  . methylPREDNISolone (SOLU-MEDROL) injection  40 mg Intravenous Q12H  . pantoprazole  (PROTONIX) IV  40 mg Intravenous Q24H    Assessment/Plan Closed loop small bowel obstruction. S/p diagnostic laparoscopy, exploratory laparotomy and small bowel resection. 11/02/2012, Haley Heckler, MD Path not back Post op ileus  1. Partial small bowel obstruction; enteritis versus mechanical.  2. Cystocele with prior bladder surgery and bladder repairs.  3. History of pericarditis.  4. History of Mnire's and vertigo  5. Mild MR  6. Degenerative disc disease with prior surgeries  7. GERD  8. History of anxiety   Plan:  Hopefully she will open up more in the next day or two.  I would leaver her NG working and foley until we can get her back on flomax. Still on Invanz also.   LOS: 6 days    Haley Wade 11/04/2012

## 2012-11-05 MED ORDER — LORAZEPAM 2 MG/ML IJ SOLN
0.2500 mg | Freq: Two times a day (BID) | INTRAMUSCULAR | Status: DC | PRN
  Administered 2012-11-05 – 2012-11-06 (×2): 0.25 mg via INTRAVENOUS
  Filled 2012-11-05 (×3): qty 1

## 2012-11-05 MED ORDER — LORAZEPAM 2 MG/ML IJ SOLN
0.2500 mg | Freq: Every evening | INTRAMUSCULAR | Status: DC | PRN
  Administered 2012-11-05 – 2012-11-06 (×2): 0.5 mg via INTRAVENOUS
  Filled 2012-11-05 (×2): qty 1

## 2012-11-05 MED ORDER — LORAZEPAM 2 MG/ML IJ SOLN: 0.2500 mg | Freq: Three times a day (TID) | INTRAMUSCULAR | Status: DC | PRN

## 2012-11-05 NOTE — Progress Notes (Signed)
Some bowel sounds, but no flatus or BM yet Encourage ambulation.  Wilmon Arms. Corliss Skains, MD, [facility]  11/05/2012 1:00 PM

## 2012-11-05 NOTE — Progress Notes (Signed)
Patient ID: Haley Wade, female   DOB: 1945-12-16, 68 y.o.   MRN: [id number] 4 Days Post-Op  Subjective: Feels ok but abd sore, walking halls well, had insomnia after taking toradol, no flatus or bm.  Objective: Vital signs in last 24 hours: Temp:  [97.4 F (36.3 C)-98.2 F (36.8 C)] 97.5 F (36.4 C) (03/25 0604) Pulse Rate:  [64-74] 64 (03/25 0604) Resp:  [15-20] 18 (03/25 0738) BP: (127-139)/(68-78) 127/71 mmHg (03/25 0604) SpO2:  [94 %-98 %] 96 % (03/25 0738) Last BM Date: 10/31/12  NPO x ice chips, foley in for bladder issues, and plan to keep it till she can go back on flomax  Intake/Output from previous day: 03/24 0701 - 03/25 0700 In: -  Out: 1450 [Urine:1150; Emesis/NG output:300] Intake/Output this shift:    General appearance: alert, cooperative and no distress GI: soft, but tender over incisions, incisions are c/d/i, no bowel sounds NGT: 300cc/24hrs  Lab Results:   Recent Labs  11/03/12 0414  WBC 11.8*  HGB 11.2*  HCT 33.1*  PLT 201    BMET  Recent Labs  11/03/12 0414  NA 132*  K 4.3  CL 99  CO2 26  GLUCOSE 161*  BUN 11  CREATININE 0.49*  CALCIUM 8.3*   PT/INR No results found for this basename: LABPROT, INR,  in the last 72 hours   Recent Labs Lab 10/29/12 2345 10/30/12 0650  AST 20 13  ALT 24 19  ALKPHOS 52 46  BILITOT 0.4 0.3  PROT 7.4 6.3  ALBUMIN 3.7 3.0*     Lipase     Component Value Date/Time   LIPASE 41 10/29/2012 2345     Studies/Results: No results found.  Medications: . acyclovir  200 mg Intravenous Daily  . antiseptic oral rinse  15 mL Mouth Rinse q12n4p  . chlorhexidine  15 mL Mouth Rinse BID  . enoxaparin (LOVENOX) injection  40 mg Subcutaneous Q24H  . ertapenem (INVANZ) IV  1 g Intravenous Q24H  . HYDROmorphone PCA 0.3 mg/mL   Intravenous Q4H  . ketorolac  30 mg Intravenous Q6H  . pantoprazole (PROTONIX) IV  40 mg Intravenous Q24H    Assessment/Plan Closed loop small bowel obstruction. S/p  diagnostic laparoscopy, exploratory laparotomy and small bowel resection. 11/02/2012, [name], MD Path no malignancy Post op ileus  1. Partial small bowel obstruction; enteritis versus mechanical.  2. Cystocele with prior bladder surgery and bladder repairs.  3. History of pericarditis.  4. History of Mnire's and vertigo  5. Mild MR  6. Degenerative disc disease with prior surgeries  7. GERD  8. History of anxiety   Plan: Still with ileus but not overwhelming output from NGT so believe she will open up soon.  Will leave NG working and foley until we can get her back on flomax.  Still on Invanz also.   LOS: 7 days    Haley Wade 11/05/2012

## 2012-11-05 NOTE — Progress Notes (Signed)
MEDICATION RELATED CONSULT NOTE - Follow Up  Pharmacy Consult for Acyclovir  Allergies  Allergen Reactions  . Bee Venom Swelling    Patient Measurements: Height: 5' (152.4 cm) Weight: 101 lb 3.2 oz (45.904 kg) IBW/kg (Calculated) : 45.5  Labs:  Recent Labs  11/03/12 0414  WBC 11.8*  HGB 11.2*  HCT 33.1*  PLT 201  CREATININE 0.49*   Estimated Creatinine Clearance: 50.4 ml/min (by C-G formula based on Cr of 0.49).  Microbiology: No results found for this or any previous visit (from the past 720 hour(s)).  Medical History: Past Medical History  Diagnosis Date  . Allergic rhinitis, cause unspecified   . Other chronic sinusitis   . Personal history of unspecified circulatory disease    Assessment:  [name] admitted with SBO now s/p surgery (POD#1). Patient has a history of herpes genitalis for which she chronically takes acyclovir 200 mg PO daily for suppression therapy.  Dr. Evlyn Kanner asked pharmacy to convert to IV formulation while patient NPO.  Patient has already received acyclovir 200 mg IV daily for the past 3 days.  Day #7 IV acyclovir  Plan:  1) Continue acyclovir 200 mg IV daily. 2) Change to PO once patient able to tolerate and PO diet and other PO meds 3) What is plan for length of therapy for Invanz post op? Today is Day #4. thanks  Berkley Harvey 11/05/2012,8:34 AM

## 2012-11-05 NOTE — Progress Notes (Signed)
Subjective: No BM or Flatus yet. Toradol helped but she slept poorly. I did not have time to make formal rounds this am and I talked to her on the phone.  Objective: Vital signs in last 24 hours: Temp:  [97.4 F (36.3 C)-98.2 F (36.8 C)] 97.5 F (36.4 C) (03/25 0604) Pulse Rate:  [64-74] 64 (03/25 0604) Resp:  [15-20] 18 (03/25 0738) BP: (127-139)/(68-78) 127/71 mmHg (03/25 0604) SpO2:  [94 %-98 %] 96 % (03/25 0738)  Intake/Output from previous day: 03/24 0701 - 03/25 0700 In: -  Out: 1450 [Urine:1150; Emesis/NG output:300] Intake/Output this shift:    Lab Results   Recent Labs  11/03/12 0414  WBC 11.8*  RBC 3.92  HGB 11.2*  HCT 33.1*  MCV 84.4  MCH 28.6  RDW 13.2  PLT 201    Recent Labs  11/03/12 0414  NA 132*  K 4.3  CL 99  CO2 26  GLUCOSE 161*  BUN 11  CREATININE 0.49*  CALCIUM 8.3*    Studies/Results: No results found.  Scheduled Meds: . acyclovir  200 mg Intravenous Daily  . antiseptic oral rinse  15 mL Mouth Rinse q12n4p  . chlorhexidine  15 mL Mouth Rinse BID  . enoxaparin (LOVENOX) injection  40 mg Subcutaneous Q24H  . ertapenem (INVANZ) IV  1 g Intravenous Q24H  . HYDROmorphone PCA 0.3 mg/mL   Intravenous Q4H  . ketorolac  30 mg Intravenous Q6H  . pantoprazole (PROTONIX) IV  40 mg Intravenous Q24H   Continuous Infusions: . dextrose 5 % and 0.45 % NaCl with KCl 20 mEq/L 100 mL/hr at 11/05/12 0557   PRN Meds:acetaminophen, acetaminophen, diphenhydrAMINE, diphenhydrAMINE, fluticasone, HYDROmorphone (DILAUDID) injection, LORazepam, naloxone, ondansetron (ZOFRAN) IV, ondansetron (ZOFRAN) IV, ondansetron, promethazine, promethazine, sodium chloride  Assessment/Plan:     1. Small bowel obstruction S/P surgery per gen surgical team. NGT/POs, IV pain meds, Foley, O2 all up to Surgery. 2. Hyponatremia - minor at 132. NTD  3. History of herpes genitalis - on Oral suppression - using Zovirax IV here. 4. Anxiety -benzodiazepine to = 0.25 mg  Ativan IV Q8 when necessary for anxiety. Usually uses 0.5 2 tabs daily and 1 HS. 5. She is afraid of D/Cing her foley until she has been restarted on PO Flomax. 6. D/C Solumedrol  7. B/C of bladder issues and recent UTIs - Dr Logan Bores - Her URO wants a copy of admission CT Ab.   LOS: 7 days   Haley Wade M 11/05/2012, 8:12 AM

## 2012-11-06 LAB — BASIC METABOLIC PANEL
BUN: 11 mg/dL (ref 6–23)
CO2: 28 mEq/L (ref 19–32)
Calcium: 8.1 mg/dL — ABNORMAL LOW (ref 8.4–10.5)
Chloride: 101 mEq/L (ref 96–112)
Creatinine, Ser: 0.55 mg/dL (ref 0.50–1.10)
GFR calc Af Amer: >90 mL/min (ref >90)
GFR calc non Af Amer: >90 mL/min (ref >90)
Glucose, Bld: 93 mg/dL (ref 70–99)
Potassium: 4.1 mEq/L (ref 3.5–5.1)
Sodium: 134 mEq/L — ABNORMAL LOW (ref 135–145)

## 2012-11-06 LAB — CBC
HCT: 29.2 % — ABNORMAL LOW (ref 36.0–46.0)
Hemoglobin: 9.9 g/dL — ABNORMAL LOW (ref 12.0–15.0)
MCH: 28.8 pg (ref 26.0–34.0)
MCHC: 33.9 g/dL (ref 30.0–36.0)
MCV: 84.9 fL (ref 78.0–100.0)
Platelets: 209 10*3/uL (ref 150–400)
RBC: 3.44 MIL/uL — ABNORMAL LOW (ref 3.87–5.11)
RDW: 13.3 % (ref 11.5–15.5)
WBC: 10.8 10*3/uL — ABNORMAL HIGH (ref 4.0–10.5)

## 2012-11-06 MED ORDER — TAMSULOSIN HCL 0.4 MG PO CAPS
0.4000 mg | ORAL_CAPSULE | Freq: Every day | ORAL | Status: DC
  Filled 2012-11-06: qty 1

## 2012-11-06 MED ORDER — HYDROMORPHONE HCL PF 1 MG/ML IJ SOLN
1.0000 mg | INTRAMUSCULAR | Status: DC | PRN
  Administered 2012-11-06 – 2012-11-07 (×3): 1 mg via INTRAVENOUS
  Filled 2012-11-06 (×3): qty 1

## 2012-11-06 MED ORDER — LORAZEPAM 0.5 MG PO TABS
0.5000 mg | ORAL_TABLET | Freq: Three times a day (TID) | ORAL | Status: DC | PRN
  Administered 2012-11-07 (×2): 0.5 mg via ORAL
  Filled 2012-11-06 (×2): qty 1

## 2012-11-06 MED ORDER — ACYCLOVIR 200 MG PO CAPS
200.0000 mg | ORAL_CAPSULE | Freq: Every day | ORAL | Status: DC
  Administered 2012-11-07 – 2012-11-08 (×2): 200 mg via ORAL
  Filled 2012-11-06 (×2): qty 1

## 2012-11-06 MED ORDER — LORAZEPAM 0.5 MG PO TABS: 0.5000 mg | ORAL_TABLET | Freq: Three times a day (TID) | ORAL | Status: DC

## 2012-11-06 NOTE — Progress Notes (Signed)
More flatus Active bowel sounds Incision c/d/i  Will d/c ng tube Sips of liquids today Advance diet tomorrow PO meds tomorrow.  Wilmon Arms. Corliss Skains, MD, [facility]  11/06/2012 1:05 PM

## 2012-11-06 NOTE — Progress Notes (Signed)
INITIAL NUTRITION ASSESSMENT  DOCUMENTATION CODES Per approved criteria  -Not Applicable   INTERVENTION: - Educated pt on low fiber diet for recent intestinal surgery, teach back method used - Diet advancement per MD. If diet unable to be advanced in next 1-2 days, recommend MD discuss enteral nutrition as pt on day 8 of being NPO. - Will continue to monitor   NUTRITION DIAGNOSIS: Inadequate oral intake related to inability to eat as evidenced by NPO.   Goal: Advance diet as tolerated to low fiber diet  Monitor:  Weights, labs, nausea, diet advancement, BM  Reason for Assessment: NPO x 8 days  67 y.o. female  Admitting Dx: SBO (small bowel obstruction)  ASSESSMENT: Pt admitted with sudden onset of abdominal pain with multiple episodes of nausea/vomiting. Pt reports abdominal pain for the past 6-7 months. Pt reports she has been eating smaller portions of food since the fall of things like omlets, yogurt, and fruit. Pt reports she was drinking Ensure and adding protein powder to drinks. Pt reports stable weight. Pt denies any nausea/vomiting today, has NGT in place which is clamped. Pt found to have small bowel obstruction and is POD # 4 laparoscopy, exploratory laparotomy, and small bowel resection.   Height: Ht Readings from Last 1 Encounters:  10/30/12 5' (1.524 m)    Weight: Wt Readings from Last 1 Encounters:  10/30/12 101 lb 3.2 oz (45.904 kg)    Ideal Body Weight: 100 lb  % Ideal Body Weight: 101  Wt Readings from Last 10 Encounters:  10/30/12 101 lb 3.2 oz (45.904 kg)  10/30/12 101 lb 3.2 oz (45.904 kg)  12/28/11 102 lb 9.6 oz (46.539 kg)  03/09/11 101 lb (45.813 kg)  05/21/09 100 lb 6.1 oz (45.533 kg)    Usual Body Weight: 101 lb  % Usual Body Weight: 100  BMI:  Body mass index is 19.76 kg/(m^2).  Estimated Nutritional Needs: Kcal: 1150-1350 Protein: 55-65g Fluid: 1.1-1.3L/day   Skin: Abdominal incision  Diet Order: NPO  EDUCATION  NEEDS: -Education needs addressed - educated pt on low fiber diet for intestinal surgery   Intake/Output Summary (Last 24 hours) at 11/06/12 1153 Last data filed at 11/06/12 1000  Gross per 24 hour  Intake      0 ml  Output   4500 ml  Net  -4500 ml    Last BM: 3/17  Labs:   Recent Labs Lab 11/02/12 0412 11/03/12 0414 11/06/12 0807  NA 132* 132* 134*  K 4.5 4.3 4.1  CL 101 99 101  CO2 23 26 28   BUN 19 11 11   CREATININE 0.53 0.49* 0.55  CALCIUM 7.7* 8.3* 8.1*  GLUCOSE 185* 161* 93    CBG (last 3)  No results found for this basename: GLUCAP,  in the last 72 hours  Scheduled Meds: . acyclovir  200 mg Intravenous Daily  . antiseptic oral rinse  15 mL Mouth Rinse q12n4p  . chlorhexidine  15 mL Mouth Rinse BID  . enoxaparin (LOVENOX) injection  40 mg Subcutaneous Q24H  . ertapenem (INVANZ) IV  1 g Intravenous Q24H  . HYDROmorphone PCA 0.3 mg/mL   Intravenous Q4H  . ketorolac  30 mg Intravenous Q6H  . pantoprazole (PROTONIX) IV  40 mg Intravenous Q24H    Continuous Infusions: . dextrose 5 % and 0.45 % NaCl with KCl 20 mEq/L 100 mL/hr at 11/06/12 0243    Past Medical History  Diagnosis Date  . Allergic rhinitis, cause unspecified   . Other chronic sinusitis   .  Personal history of unspecified circulatory disease     Past Surgical History  Procedure Laterality Date  . Rhinoplasty    . Total abdominal hysterectomy    . Tonsillectomy    . Breast enhancement surgery      revisions  . Bladder surgery    . Laparoscopy N/A 11/01/2012    Procedure: LAPAROSCOPY DIAGNOSTIC;  Surgeon: [name], MD;  Location: [facility];  Service: General;  Laterality: N/A;  . Laparotomy N/A 11/01/2012    Procedure: EXPLORATORY LAPAROTOMY;  Surgeon: [name], MD;  Location: [facility];  Service: General;  Laterality: N/A;  . Lysis of adhesion N/A 11/01/2012    Procedure: LYSIS OF ADHESION;  Surgeon: [name], MD;  Location: [facility];  Service: General;  Laterality: N/A;  . Bowel  resection N/A 11/01/2012    Procedure: SMALL BOWEL RESECTION;  Surgeon: [name], MD;  Location: [facility];  Service: General;  Laterality: N/A;     Levon Hedger MS, RD, LDN [phone] Pager [phone] After Hours Pager

## 2012-11-06 NOTE — Progress Notes (Signed)
Patient ID: Haley Wade, female   DOB: 21-Sep-1945, 67 y.o.   MRN: [id number]  5 Days Post-Op  Subjective: Feels better, getting stronger, starting having flatus this am, walking halls well, denies n/v  Objective: Vital signs in last 24 hours: Temp:  [98.2 F (36.8 C)-98.4 F (36.9 C)] 98.2 F (36.8 C) (03/26 0512) Pulse Rate:  [72-74] 73 (03/26 0512) Resp:  [12-18] 12 (03/26 0759) BP: (126-138)/(63-78) 126/63 mmHg (03/26 0512) SpO2:  [93 %-99 %] 98 % (03/26 0759) Last BM Date: 10/28/12  NPO x ice chips, foley in for bladder issues, and plan to keep it till she can go back on flomax  Intake/Output from previous day: 03/25 0701 - 03/26 0700 In: 0  Out: 4450 [Urine:3950; Emesis/NG output:500] Intake/Output this shift:    General appearance: alert, cooperative and no distress GI: soft, but tender over incisions, incisions are c/d/i, no bowel sounds NGT: no output recorded this am, cannister line that is marked has minimal above it  Lab Results:   Recent Labs  11/06/12 0350  WBC 10.8*  HGB 9.9*  HCT 29.2*  PLT 209    BMET  Recent Labs  11/06/12 0807  NA 134*  K 4.1  CL 101  CO2 28  GLUCOSE 93  BUN 11  CREATININE 0.55  CALCIUM 8.1*   PT/INR No results found for this basename: LABPROT, INR,  in the last 72 hours  No results found for this basename: AST, ALT, ALKPHOS, BILITOT, PROT, ALBUMIN,  in the last 168 hours   Lipase     Component Value Date/Time   LIPASE 41 10/29/2012 2345     Studies/Results: No results found.  Medications: . acyclovir  200 mg Intravenous Daily  . antiseptic oral rinse  15 mL Mouth Rinse q12n4p  . chlorhexidine  15 mL Mouth Rinse BID  . enoxaparin (LOVENOX) injection  40 mg Subcutaneous Q24H  . ertapenem (INVANZ) IV  1 g Intravenous Q24H  . HYDROmorphone PCA 0.3 mg/mL   Intravenous Q4H  . ketorolac  30 mg Intravenous Q6H  . pantoprazole (PROTONIX) IV  40 mg Intravenous Q24H    Assessment/Plan Closed loop small  bowel obstruction. S/p diagnostic laparoscopy, exploratory laparotomy and small bowel resection. 11/02/2012, [name], MD Path no malignancy Post op ileus  1. Partial small bowel obstruction; enteritis versus mechanical.  2. Cystocele with prior bladder surgery and bladder repairs.  3. History of pericarditis.  4. History of Mnire's and vertigo  5. Mild MR  6. Degenerative disc disease with prior surgeries  7. GERD  8. History of anxiety   Plan: Still with ileus but having some flatus now, will clamp NGT and see if she tolerates it, poss remove it later today or in AM.  Foley until we can get her back on flomax.  Still on Invanz Day#4, plan to d/c this on Day#7.   LOS: 8 days    Trudi Morgenthaler 11/06/2012

## 2012-11-06 NOTE — Progress Notes (Signed)
Subjective: No BM but starting to have Flatus. Slept better last night Remaining happy  Objective: Vital signs in last 24 hours: Temp:  [98.2 F (36.8 C)-98.4 F (36.9 C)] 98.2 F (36.8 C) (03/26 0512) Pulse Rate:  [72-74] 73 (03/26 0512) Resp:  [14-18] 18 (03/26 0512) BP: (126-138)/(63-78) 126/63 mmHg (03/26 0512) SpO2:  [93 %-99 %] 98 % (03/26 0512)  Intake/Output from previous day: 03/25 0701 - 03/26 0700 In: 0  Out: 4450 [Urine:3950; Emesis/NG output:500] Intake/Output this shift:   PE--General: alert looks well, NGT in place, lungs clear no wheeze, ht regular. abd soft and now some + BSs. Drain still in place. no edema. Awake alert mentating well     Lab Results   Recent Labs  11/06/12 0350  WBC 10.8*  RBC 3.44*  HGB 9.9*  HCT 29.2*  MCV 84.9  MCH 28.8  RDW 13.3  PLT 209   No results found for this basename: NA, K, CL, CO2, GLUCOSE, BUN, CREATININE, CALCIUM,  in the last 72 hours  Studies/Results: No results found.  Scheduled Meds: . acyclovir  200 mg Intravenous Daily  . antiseptic oral rinse  15 mL Mouth Rinse q12n4p  . chlorhexidine  15 mL Mouth Rinse BID  . enoxaparin (LOVENOX) injection  40 mg Subcutaneous Q24H  . ertapenem (INVANZ) IV  1 g Intravenous Q24H  . HYDROmorphone PCA 0.3 mg/mL   Intravenous Q4H  . ketorolac  30 mg Intravenous Q6H  . pantoprazole (PROTONIX) IV  40 mg Intravenous Q24H   Continuous Infusions: . dextrose 5 % and 0.45 % NaCl with KCl 20 mEq/L 100 mL/hr at 11/06/12 0243   PRN Meds:acetaminophen, acetaminophen, diphenhydrAMINE, diphenhydrAMINE, fluticasone, HYDROmorphone (DILAUDID) injection, LORazepam, LORazepam, naloxone, ondansetron (ZOFRAN) IV, ondansetron (ZOFRAN) IV, ondansetron, promethazine, promethazine, sodium chloride  Assessment/Plan:     1. Small bowel obstruction S/P surgery per gen surgical team. NGT/NPO but expect NGT to be removed soon and start liq soon, IV pain meds hopefully can be converted to oral soon  - she is not taking much, Foley to be removed post starting Flomax, O2 to be D/ced when off the PCA. Per Surgery. 2. Hyponatremia - minor at 132. NTD. Rechecking today.  3. 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 4. Anxiety -benzodiazepine to = 0.25 - .50 mg Ativan IV Q8 when necessary for anxiety. Usually uses 0.5 2 tabs daily and 1 HS. 5. She is afraid of D/Cing her foley until she has been restarted on PO Flomax.  She realizes she is high risk of UTI as outpt and we discussed Dr Logan Bores and her tools. 6. Solumedrol is off 7. B/C of bladder issues and recent UTIs - Dr Logan Bores - Her URO wants a copy of admission CT Ab.  I faxed it and will give pt a copy 8. Post op anemia and related to IVF fluid intake- NTD - will monitor as outpatient   LOS: 8 days   Jerzey Komperda M 11/06/2012, 7:43 AM

## 2012-11-07 MED ORDER — TRAMADOL HCL 50 MG PO TABS: 50.0000 mg | ORAL_TABLET | Freq: Four times a day (QID) | ORAL | Status: DC | PRN

## 2012-11-07 MED ORDER — TAMSULOSIN HCL 0.4 MG PO CAPS: 0.4000 mg | ORAL_CAPSULE | Freq: Once | ORAL | Status: DC

## 2012-11-07 MED ORDER — LORAZEPAM 0.5 MG PO TABS: 0.1250 mg | ORAL_TABLET | Freq: Three times a day (TID) | ORAL | Status: DC | PRN

## 2012-11-07 MED ORDER — PANTOPRAZOLE SODIUM 40 MG PO TBEC
40.0000 mg | DELAYED_RELEASE_TABLET | Freq: Every day | ORAL | Status: DC
  Administered 2012-11-07 – 2012-11-08 (×2): 40 mg via ORAL
  Filled 2012-11-07 (×2): qty 1

## 2012-11-07 MED ORDER — LORAZEPAM 2 MG/ML PO CONC: 0.1250 mg | Freq: Three times a day (TID) | ORAL | Status: DC | PRN

## 2012-11-07 MED ORDER — TAMSULOSIN HCL 0.4 MG PO CAPS
0.4000 mg | ORAL_CAPSULE | Freq: Every day | ORAL | Status: DC
  Administered 2012-11-07: 0.4 mg via ORAL
  Filled 2012-11-07 (×3): qty 1

## 2012-11-07 MED ORDER — TAMSULOSIN HCL 0.4 MG PO CAPS
0.4000 mg | ORAL_CAPSULE | ORAL | Status: AC
  Administered 2012-11-07: 0.4 mg via ORAL
  Filled 2012-11-07: qty 1

## 2012-11-07 MED ORDER — ESTROGENS CONJUGATED 0.625 MG PO TABS
0.6250 mg | ORAL_TABLET | Freq: Every day | ORAL | Status: DC
  Administered 2012-11-07 – 2012-11-08 (×2): 0.625 mg via ORAL
  Filled 2012-11-07 (×2): qty 1

## 2012-11-07 MED ORDER — SPIRONOLACTONE 50 MG PO TABS
50.0000 mg | ORAL_TABLET | Freq: Three times a day (TID) | ORAL | Status: DC
  Administered 2012-11-07 (×2): 50 mg via ORAL
  Filled 2012-11-07 (×5): qty 1

## 2012-11-07 MED ORDER — HYDROCODONE-ACETAMINOPHEN 5-325 MG PO TABS
1.0000 | ORAL_TABLET | ORAL | Status: DC | PRN
  Administered 2012-11-07 (×2): 1 via ORAL
  Filled 2012-11-07 (×2): qty 1

## 2012-11-07 NOTE — Progress Notes (Signed)
6 Days Post-Op  Subjective: Doing much better ready to advance her diet.    Objective: Vital signs in last 24 hours: Temp:  [98.4 F (36.9 C)-99.5 F (37.5 C)] 99.5 F (37.5 C) (03/27 0601) Pulse Rate:  [90-100] 98 (03/27 0601) Resp:  [14-20] 18 (03/27 0601) BP: (103-133)/(59-68) 129/65 mmHg (03/27 0601) SpO2:  [95 %-99 %] 99 % (03/27 0601) Last BM Date: 11/07/12 (had sm BM this morning) 720 PO, TM 99.4, VSS, +BM, Diet: clears labs  ok Intake/Output from previous day: 03/26 0701 - 03/27 0700 In: 720 [P.O.:720] Out: 3175 [Urine:3125; Emesis/NG output:50] Intake/Output this shift:    General appearance: alert, cooperative and no distress Resp: clear to auscultation bilaterally GI: soft, tender, few BS, incision is OK.  Lab Results:   Recent Labs  11/06/12 0350  WBC 10.8*  HGB 9.9*  HCT 29.2*  PLT 209    BMET  Recent Labs  11/06/12 0807  NA 134*  K 4.1  CL 101  CO2 28  GLUCOSE 93  BUN 11  CREATININE 0.55  CALCIUM 8.1*   PT/INR No results found for this basename: LABPROT, INR,  in the last 72 hours  No results found for this basename: AST, ALT, ALKPHOS, BILITOT, PROT, ALBUMIN,  in the last 168 hours   Lipase     Component Value Date/Time   LIPASE 41 10/29/2012 2345     Studies/Results: No results found.  Medications: . acyclovir  200 mg Oral Daily  . antiseptic oral rinse  15 mL Mouth Rinse q12n4p  . chlorhexidine  15 mL Mouth Rinse BID  . enoxaparin (LOVENOX) injection  40 mg Subcutaneous Q24H  . pantoprazole  40 mg Oral Daily  . tamsulosin  0.4 mg Oral QHS  . tamsulosin  0.4 mg Oral NOW    Assessment/Plan 1. Partial small bowel obstruction; enteritis versus mechanical.  Closed loop small bowel obstruction. S/p Diagnostic laparoscopy, exploratory laparotomy, and small bowel resection, 11/02/12, Dr. Gerrit Friends  2. Cystocele with prior bladder surgery and bladder repairs.  Interstitial cystitis, on flomax at home. 3. History of pericarditis.  4.  History of Mnire's and vertigo  5. Mild MR  6. Degenerative disc disease with prior surgeries  7. GERD  8. History of anxiety  Plan:  Full liquids, if ok soft low fiber diet in Am .Marland Kitchen Foley out per Dr. Timothy Lasso.   LOS: 9 days    Haley Wade 11/07/2012

## 2012-11-07 NOTE — Progress Notes (Signed)
Pt ambulating in halls. No SOB, dyspnea, or acute distress noted. Will continue to monitor.

## 2012-11-07 NOTE — Progress Notes (Signed)
Will advance diet. BM today Will d/c Foley tomorrow after 24 hours of Flomax. Should be ready for discharge tomorrow.  Wilmon Arms. Corliss Skains, MD, [facility]  11/07/2012 9:39 AM

## 2012-11-07 NOTE — Discharge Summary (Signed)
Physician Discharge Summary  Patient ID: Haley Wade MRN: [id number] DOB/AGE: April 30, 1946 67 y.o.  Admit date: 10/29/2012 Discharge date: 11/08/2012  Admission Diagnoses: Abdominal pain, nausea and vomiting. Enteritis vs SBO Hyponatremia - probably from dehydration and spironolactone. Continue with IV fluids gently and recheck metabolic panel. Since patient is dehydrated discontinue spironolactone for now.  History of herpes genitalis -  History of anxiety -   History of allergic rhinitis - COPD   Discharge Diagnoses:  1. Closed loop small bowel obstruction 2. Cystocele with prior bladder surgery and bladder repairs.  3. History of pericarditis.  4. History of Mnire's and vertigo  5. Mild MR  6. Degenerative disc disease with prior surgeries  7. GERD  8. History of anxiety  Principal Problem:   SBO (small bowel obstruction) Active Problems:   RHINOSINUSITIS, CHRONIC   Hyponatremia   PROCEDURES: S/p Diagnostic laparoscopy, exploratory laparotomy, and small bowel resection, 11/02/12, Dr. Laurin Coder Course: Patient is a 67 year old woman who had problems with abdominal pain in December and January for approximately 6 weeks. She underwent EGD and colonoscopy at [facility] and recovered from the symptoms and did well. She was also seen last week by her doctor in more is will we treated her for bladder pain and urethral pain for a UTI. Her culture came back today negative but she has been on Cipro.  She reports she was  fine over the weekend and Monday. Yesterday around 6 PM she has some pain in her right side going to the left then developed nausea and vomiting. She's had continued nausea/vomiting and presented to the emergency room where she was admitted by the hospitalist service. Labs on admission shows a sodium of 128, potassium 3.9, chloride 92, CO2 of 24 BUN of 24 creatinine 0.62 glucose was elevated LFTs were normal lipase was 41. White count 14,700  hemoglobin and hematocrit platelets were normal.` Acute abdominal series showed a long segment distended bowel in the pelvis with a differential of enteritis versus localized ileus. A CT scan was then obtained which showed some dilated fluid-filled loops of jejunum with decompressed ileum. There was an interloop fluid collections with enhancing thickening suggestive of an inflammatory process such as Crohn's. Were asked to the patient in consultation. She has a prior history of cholecystectomy and   She was admitted by medicine, and treated conservatively.  She failed to improve and film on 3/21 revealed no improvement.  She was taken to the OR that day with above findings.  She tolerated the procedure well.  Her foley was left in till she could be restarted on Flomax. 11/07/12. She was advanced to a soft diet, she was able to void without the Foley the following a.m. 11/08/12 and discharged home. She will follow up with primary care Dr. Timothy Lasso. Surgery see her back in our office staple removal followed by 2 week visit by Dr.Gerkin.  She will be seen office on Monday for staple removal 11/11/12. Condition on discharge: Improving  Will Haley Wade physician assistant for Dr. Darnell Level   Condition on discharge: Improved.       Disposition: 01-Home or Self Care       Future Appointments Provider Department Dept Phone   11/11/2012 10:30 AM [facility] Nurse [facility], [place] [phone]       Medication List    TAKE these medications       acetaminophen 325 MG tablet  Commonly known as:  TYLENOL  Take 2 tablets (  650 mg total) by mouth every 6 (six) hours as needed.     conjugated estrogens vaginal cream  Commonly known as:  PREMARIN  Place 0.5 g vaginally 2 (two) times a week. Patient states that uses this just whenever she remembers during the week no specific days to list.     EPINEPHrine 0.3 mg/0.3 mL Devi  Commonly known as:  EPIPEN  Inject 0.3 mLs (0.3 mg total) into  the muscle once. Use as directed     fluticasone 50 MCG/ACT nasal spray  Commonly known as:  FLONASE  Place 2 sprays into the nose daily as needed for rhinitis. For nasal drainage     HYDROcodone-acetaminophen 5-325 MG per tablet  Commonly known as:  NORCO/VICODIN  Take 1 tablet by mouth every 4 (four) hours as needed.     LORazepam 0.5 MG tablet  Commonly known as:  ATIVAN  Take 0.25-0.5 tablets by mouth every 8 (eight) hours as needed. Take 0.5 tablet twice a day  Take 1 tablet at bedtime     PREMARIN 0.625 MG tablet  Generic drug:  estrogens (conjugated)  Take 1 tablet by mouth Daily.     spironolactone 100 MG tablet  Commonly known as:  ALDACTONE  Take 0.5 tablets by mouth 3 (three) times daily.     tamsulosin 0.4 MG Caps  Commonly known as:  FLOMAX  Take 0.4 mg by mouth daily.     ZOVIRAX 200 MG capsule  Generic drug:  acyclovir  Take 1 capsule by mouth daily.       Follow-up Information   Follow up with [name], MD In 2 weeks.   Contact information:   [address] [place] [address] [phone]       Schedule an appointment as soon as possible for a visit with Gwen Pounds, MD. (Make an appointment in 2 weeks, sooner if you have a problem.)    Contact information:   [address] [facility] [place] [address] [phone]       Follow up with [name], MD On 11/11/2012. (Be at office to check in at 10:15 AM for a 10:30 appointment to have staples removed.)    Contact information:   [address] [place] [address] [phone]       Signed: Sherrie George 11/08/2012, 1:58 PM

## 2012-11-07 NOTE — Progress Notes (Signed)
Subjective: + BM.  S/P Soup and fluids and doing well. Happier Slept well No new C/O Asking @ eggs.  Objective: Vital signs in last 24 hours: Temp:  [98.4 F (36.9 C)-99.5 F (37.5 C)] 99.5 F (37.5 C) (03/27 0601) Pulse Rate:  [90-100] 98 (03/27 0601) Resp:  [12-20] 18 (03/27 0601) BP: (103-133)/(59-68) 129/65 mmHg (03/27 0601) SpO2:  [95 %-99 %] 99 % (03/27 0601)  Intake/Output from previous day: 03/26 0701 - 03/27 0700 In: 720 [P.O.:720] Out: 3175 [Urine:3125; Emesis/NG output:50] Intake/Output this shift:   PE--General: alert looks well, NGT out, lungs clear, ht regular. abd soft and + BSs. No edema. Awake alert mentating well     Lab Results   Recent Labs  11/06/12 0350  WBC 10.8*  RBC 3.44*  HGB 9.9*  HCT 29.2*  MCV 84.9  MCH 28.8  RDW 13.3  PLT 209    Recent Labs  11/06/12 0807  NA 134*  K 4.1  CL 101  CO2 28  GLUCOSE 93  BUN 11  CREATININE 0.55  CALCIUM 8.1*    Studies/Results: No results found.  Scheduled Meds: . acyclovir  200 mg Oral Daily  . antiseptic oral rinse  15 mL Mouth Rinse q12n4p  . chlorhexidine  15 mL Mouth Rinse BID  . enoxaparin (LOVENOX) injection  40 mg Subcutaneous Q24H  . pantoprazole (PROTONIX) IV  40 mg Intravenous Q24H  . tamsulosin  0.4 mg Oral Daily   Continuous Infusions: . dextrose 5 % and 0.45 % NaCl with KCl 20 mEq/L 40 mL/hr at 11/07/12 0133   PRN Meds:acetaminophen, acetaminophen, fluticasone, HYDROmorphone (DILAUDID) injection, LORazepam, LORazepam, ondansetron (ZOFRAN) IV, ondansetron, promethazine, promethazine  Assessment/Plan:     1. Small bowel obstruction S/P surgery per gen surgical team. Now on Clears as NGT removed and she is recovering nicely.  I restarted PO Meds and convinced her to D/C foley.  If OK with Gen Surgery she wants to shower.  I anticipate them advancing diet and D/cing her tomorrow. I will let them convert IV pain meds to PO only O2 is off 2. Hyponatremia - minor at 132 - 134  NTD 3. History of herpes genitalis - on Oral suppression - Place back on PO Zovirax. 4. Anxiety -benzodiazepine to = 0.25 - .50 mg Ativan PO Q8 =  0.25 BID  and 0.5 HS. 5. Bladder issues and chronic recurrent UTI's - Flomax restarted, Foley to be D/ced. Dr Logan Bores.  We discussed ways at home to help avert a UTI despite the prolonged Foley use. 6. Solumedrol is off 7. Post op anemia = Hbg 9.9 and related to IVF fluid intake- NTD - will monitor as outpatient   LOS: 9 days   Haley Wade M 11/07/2012, 7:29 AM

## 2012-11-08 MED ORDER — HYDROCODONE-ACETAMINOPHEN 5-325 MG PO TABS: 1.0000 | ORAL_TABLET | ORAL | Status: DC | PRN

## 2012-11-08 MED ORDER — ACETAMINOPHEN 325 MG PO TABS: 650.0000 mg | ORAL_TABLET | Freq: Four times a day (QID) | ORAL | Status: DC | PRN

## 2012-11-08 MED ORDER — SPIRONOLACTONE 50 MG PO TABS
50.0000 mg | ORAL_TABLET | Freq: Every day | ORAL | Status: DC
  Administered 2012-11-08: 50 mg via ORAL
  Filled 2012-11-08: qty 1

## 2012-11-08 NOTE — Progress Notes (Signed)
Patient has voided.  Medication adjustment per Dr. Timothy Lasso.  Ready for discharge today. Follow-up with Dr. Gerrit Friends next week for staple removal.  Haley Wade. Haley Skains, MD, [facility] Surgery  11/08/2012 9:27 AM

## 2012-11-08 NOTE — Progress Notes (Signed)
Subjective: Eating well.  Objective: Vital signs in last 24 hours: Temp:  [98.5 F (36.9 C)-99.3 F (37.4 C)] 98.5 F (36.9 C) (03/28 0509) Pulse Rate:  [89-100] 89 (03/28 0509) Resp:  [18] 18 (03/28 0509) BP: (94-100)/(42-71) 97/42 mmHg (03/28 0509) SpO2:  [97 %-98 %] 97 % (03/28 0509)  Intake/Output from previous day: 03/27 0701 - 03/28 0700 In: 480 [P.O.:480] Out: 3300 [Urine:3300] Intake/Output this shift:     Lab Results   Recent Labs  11/06/12 0350  WBC 10.8*  RBC 3.44*  HGB 9.9*  HCT 29.2*  MCV 84.9  MCH 28.8  RDW 13.3  PLT 209    Recent Labs  11/06/12 0807  NA 134*  K 4.1  CL 101  CO2 28  GLUCOSE 93  BUN 11  CREATININE 0.55  CALCIUM 8.1*    Studies/Results: No results found.  Scheduled Meds: . acyclovir  200 mg Oral Daily  . antiseptic oral rinse  15 mL Mouth Rinse q12n4p  . chlorhexidine  15 mL Mouth Rinse BID  . enoxaparin (LOVENOX) injection  40 mg Subcutaneous Q24H  . estrogens (conjugated)  0.625 mg Oral Daily  . pantoprazole  40 mg Oral Daily  . spironolactone  50 mg Oral TID  . tamsulosin  0.4 mg Oral QHS   Continuous Infusions:   PRN Meds:acetaminophen, acetaminophen, fluticasone, HYDROcodone-acetaminophen, HYDROmorphone (DILAUDID) injection, LORazepam, LORazepam, ondansetron (ZOFRAN) IV, ondansetron, promethazine, promethazine, traMADol  Assessment/Plan:     1. Small bowel obstruction S/P surgery per gen surgical team. Diet advancing/Pain controlled. Anticipate D/C today per Gen Surg team 2. Hyponatremia - minor at 132 - 134 NTD. I will recheck as outpt 3. 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 4. Anxiety -benzodiazepine to = 0.25 - .50 mg Ativan PO Q8 =  0.25 BID  and 0.5 HS. 5. Bladder issues and chronic recurrent UTI's - Flomax restarted, Foley to be D/ced. Dr Logan Bores.  We discussed ways at home to help avert a UTI despite the prolonged Foley use 6. Post op anemia = Hbg 9.9 and related to IVF fluid  intake- NTD - will monitor as outpatient 7. I see that aldactone is back on list at 50 TID  - I would resume slower. I talked to pt @ once per day and slowly advance.  She will do it 8. I will have my nurse call her and schedule a 2 week OV and get CBC and BMET 9. No Charge today   LOS: 10 days   Devlin Brink M 11/08/2012, 7:48 AM

## 2012-11-08 NOTE — Progress Notes (Signed)
7 Days Post-Op  Subjective: Foley still in, she was sleeping and they didn't want to wake her.  Tolerated full liquids, nothing else she wanted.  Small BM yesterday, not recorded. Concerned she will get a UTI after foley is out.  Objective: Vital signs in last 24 hours: Temp:  [98.5 F (36.9 C)-99.3 F (37.4 C)] 98.5 F (36.9 C) (03/28 0509) Pulse Rate:  [89-100] 89 (03/28 0509) Resp:  [18] 18 (03/28 0509) BP: (94-100)/(42-71) 97/42 mmHg (03/28 0509) SpO2:  [97 %-98 %] 97 % (03/28 0509) Last BM Date: 11/07/12 480 PO recorded, afebrile, BP down some last PM, no labs   Intake/Output from previous day: 03/27 0701 - 03/28 0700 In: 480 [P.O.:480] Out: 3300 [Urine:3300] Intake/Output this shift:    General appearance: alert, cooperative and no distress Resp: clear to auscultation bilaterally GI: soft, tender, + BS, incision looks good.  Staples in place.  Lab Results:   Recent Labs  11/06/12 0350  WBC 10.8*  HGB 9.9*  HCT 29.2*  PLT 209    BMET  Recent Labs  11/06/12 0807  NA 134*  K 4.1  CL 101  CO2 28  GLUCOSE 93  BUN 11  CREATININE 0.55  CALCIUM 8.1*   PT/INR No results found for this basename: LABPROT, INR,  in the last 72 hours  No results found for this basename: AST, ALT, ALKPHOS, BILITOT, PROT, ALBUMIN,  in the last 168 hours   Lipase     Component Value Date/Time   LIPASE 41 10/29/2012 2345     Studies/Results: No results found.  Medications: . acyclovir  200 mg Oral Daily  . antiseptic oral rinse  15 mL Mouth Rinse q12n4p  . chlorhexidine  15 mL Mouth Rinse BID  . enoxaparin (LOVENOX) injection  40 mg Subcutaneous Q24H  . estrogens (conjugated)  0.625 mg Oral Daily  . pantoprazole  40 mg Oral Daily  . spironolactone  50 mg Oral TID  . tamsulosin  0.4 mg Oral QHS    Assessment/Plan 1. Partial small bowel obstruction; enteritis versus mechanical.  Closed loop small bowel obstruction. S/p Diagnostic laparoscopy, exploratory laparotomy,  and small bowel resection, 11/02/12, Dr. Gerrit Friends  2. Cystocele with prior bladder surgery and bladder repairs.  Interstitial cystitis, on flomax at home.  3. History of pericarditis.  4. History of Mnire's and vertigo  5. Mild MR  6. Degenerative disc disease with prior surgeries  7. GERD  8. History of anxiety    Plan:  D/c foley, mobilize, see how she does and hope to d/c later this AM.  I told her if she has UTI sx doctors warned her about to call DR. Russo's office.     LOS: 10 days    Haley Wade 11/08/2012

## 2012-11-08 NOTE — Discharge Summary (Signed)
Haley Wade. Corliss Skains, MD, [facility] Surgery  11/08/2012 4:36 PM

## 2012-11-08 NOTE — Progress Notes (Signed)
Pt D/C home, denies pain, no new complain, alert and oriented, on room air. D/C instructions done, medication administration instructions done. Pt verbalizes understanding.

## 2012-11-11 ENCOUNTER — Telehealth (INDEPENDENT_AMBULATORY_CARE_PROVIDER_SITE_OTHER): Payer: Self-pay

## 2012-11-11 ENCOUNTER — Other Ambulatory Visit (INDEPENDENT_AMBULATORY_CARE_PROVIDER_SITE_OTHER): Payer: Self-pay

## 2012-11-11 ENCOUNTER — Ambulatory Visit (INDEPENDENT_AMBULATORY_CARE_PROVIDER_SITE_OTHER): Payer: BC Managed Care – PPO | Admitting: General Surgery

## 2012-11-11 VITALS — BP 126/78 | HR 111 | Temp 97.9°F | Resp 16 | Ht 60.0 in | Wt 94.4 lb

## 2012-11-11 DIAGNOSIS — R195 Other fecal abnormalities: Secondary | ICD-10-CM

## 2012-11-11 DIAGNOSIS — Z4802 Encounter for removal of sutures: Secondary | ICD-10-CM

## 2012-11-11 NOTE — Progress Notes (Signed)
Patient came in s/p exp laparotomy on 11/01/12 by Dr.Gerkin for staple removal.patient's incisions were healing very well with zero signs of infection, drainage, or redness...staples were removed intact and benzoin and 1/2in steri-strips were placed over the incision/ pt tolerated very well..patient was concerned about having some black stools that started on Saturday 3/29 when she got home and started having regular bowel movements..Dr.Gerkin was in [place] office so after speaking with him he did not think that it was any concern and just said to order a CBC on her a couple days before her follow up appt on 4/21@9 :30..I told the patient that I would give this info to his nurse Arline Asp and that she would give her a call with instructions on when and where to go..patient understood and stated that she would call Arline Asp or the triage nurses if she had any other questions or concerns before then

## 2012-11-11 NOTE — Telephone Encounter (Signed)
Lab slip for cbc at [facility] mailed to pt. Labs to be drawn 4-18.

## 2012-11-12 ENCOUNTER — Telehealth (INDEPENDENT_AMBULATORY_CARE_PROVIDER_SITE_OTHER): Payer: Self-pay | Admitting: General Surgery

## 2012-11-12 NOTE — Telephone Encounter (Signed)
Patient called wanted to know about her know that she was having some pain from her surgery site, I asked if she is having any fever, she is having no fever. She did not have a BM today due to taking pain meds, I told her that taking any pain will make her BM not move, so I told her to drink a lot of water and take some M O M to help with her BM. I told her if she needed [place] anything to please call back anytime

## 2012-11-13 ENCOUNTER — Ambulatory Visit (INDEPENDENT_AMBULATORY_CARE_PROVIDER_SITE_OTHER): Payer: BC Managed Care – PPO

## 2012-11-13 DIAGNOSIS — J309 Allergic rhinitis, unspecified: Secondary | ICD-10-CM

## 2012-11-15 ENCOUNTER — Telehealth (INDEPENDENT_AMBULATORY_CARE_PROVIDER_SITE_OTHER): Payer: Self-pay

## 2012-11-15 NOTE — Telephone Encounter (Signed)
The pt called to see when she can drive.  She is off Narcotics.    I told her since surgery was 3/21 she can drive now if she is able to wear her seatbelt, slam on brakes and twist and turn in the car as needed.  She is not sure she can so she will wait until the middle of next week.

## 2012-11-20 ENCOUNTER — Ambulatory Visit: Payer: BC Managed Care – PPO

## 2012-11-22 ENCOUNTER — Ambulatory Visit (INDEPENDENT_AMBULATORY_CARE_PROVIDER_SITE_OTHER): Payer: BC Managed Care – PPO

## 2012-11-22 DIAGNOSIS — J309 Allergic rhinitis, unspecified: Secondary | ICD-10-CM | POA: Diagnosis not present

## 2012-11-27 ENCOUNTER — Ambulatory Visit (INDEPENDENT_AMBULATORY_CARE_PROVIDER_SITE_OTHER): Payer: BC Managed Care – PPO

## 2012-11-27 DIAGNOSIS — J309 Allergic rhinitis, unspecified: Secondary | ICD-10-CM

## 2012-12-02 ENCOUNTER — Ambulatory Visit (INDEPENDENT_AMBULATORY_CARE_PROVIDER_SITE_OTHER): Payer: BC Managed Care – PPO | Admitting: Surgery

## 2012-12-02 ENCOUNTER — Encounter (INDEPENDENT_AMBULATORY_CARE_PROVIDER_SITE_OTHER): Payer: Self-pay

## 2012-12-02 ENCOUNTER — Encounter (INDEPENDENT_AMBULATORY_CARE_PROVIDER_SITE_OTHER): Payer: Self-pay | Admitting: Surgery

## 2012-12-02 VITALS — BP 122/76 | HR 89 | Temp 97.3°F | Ht 60.0 in | Wt 93.4 lb

## 2012-12-02 DIAGNOSIS — K56609 Unspecified intestinal obstruction, unspecified as to partial versus complete obstruction: Secondary | ICD-10-CM

## 2012-12-02 NOTE — Progress Notes (Signed)
General Surgery [facility]  Visit Diagnoses: 1. SBO (small bowel obstruction)     HISTORY: The patient is a 67 year old white female who underwent exploratory laparotomy and small bowel resection for small bowel obstruction in March 2014. Postoperative course has been uneventful. She is now 4 weeks out from surgery and resuming normal activities. She presents today for postoperative visit.  EXAM: Abdomen is soft nontender without distention. Midline incision is well-healed. Laparoscopy sites are well healed. No sign of herniation. Minimal tenderness.  IMPRESSION: Status post exploratory laparotomy and small bowel resection for closed loop small bowel obstruction  PLAN: Patient will gradually resume normal activity. I have encouraged her to advance to a regular diet. I'm encouraging her to resume aerobic activity. She will apply topical creams to her incision. She will return for surgical care as needed.  [name], MD, FACS General & Endocrine Surgery [facility]

## 2012-12-02 NOTE — Patient Instructions (Signed)
[facility]  OPEN ABDOMINAL SURGERY: POST OP INSTRUCTIONS  Always review your discharge instruction sheet given to you by the facility where your surgery was performed.  1. A prescription for pain medication may be given to you upon discharge.  Take your pain medication as prescribed.  If narcotic pain medicine is not needed, then you may take acetaminophen (Tylenol) or ibuprofen (Advil) as needed. 2. Take your usually prescribed medications unless otherwise directed. 3. If you need a refill on your pain medication, please contact your pharmacy. They will contact our office to request authorization.  Prescriptions will not be filled after 5 pm or on weekends. 4. You should follow a light diet the first few days after arrival home, such as soup and crackers, unless your doctor has advised otherwise. A high-fiber, low fat diet can be resumed as tolerated.  Be sure to include plenty of fluids daily.  5. Most patients will experience some swelling and bruising in the area of the incision. Ice packs will help. Swelling and bruising can take several days to resolve. 6. It is common to experience some constipation if taking pain medication after surgery.  Increasing fluid intake and taking a stool softener will usually help or prevent this problem from occurring.  A mild laxative (Milk of Magnesia or Miralax) should be taken according to package directions if there are no bowel movements after 48 hours. 7.  You may have steri-strips (small skin tapes) in place directly over the incision.  These strips should be left on the skin for 7-10 days.  If your surgeon used skin glue on the incision, you may shower in 24 hours.  The glue will flake off over the next 2-3 weeks.  Any sutures or staples will be removed at the office during your follow-up visit. You may find that a light gauze bandage over your incision may keep your staples from being rubbed or pulled. You may shower and replace the bandage  daily. 8. ACTIVITIES:  You may resume regular (light) daily activities beginning the next day-such as daily self-care, walking, climbing stairs-gradually increasing activities as tolerated.  You may have sexual intercourse when it is comfortable.  Refrain from any heavy lifting or straining until approved by your doctor.  You may drive when you no longer are taking prescription pain medication, you can comfortably wear a seatbelt, and you can safely maneuver your car and apply brakes. 9. You should see your doctor in the office for a follow-up appointment approximately two weeks after your surgery.  Make sure that you call for this appointment within a day or two after you arrive home to insure a convenient appointment time.  WHEN TO CALL YOUR DOCTOR: 1. Fever greater than 101.0 2. Inability to urinate 3. Persistent nausea and/or vomiting 4. Extreme swelling or bruising 5. Continued bleeding from incision 6. Increased pain, redness, or drainage from the incision 7. Difficulty swallowing or breathing 8. Muscle cramping or spasms 9. Numbness or tingling in hands or around lips  IF YOU HAVE DISABILITY OR FAMILY LEAVE FORMS, YOU MUST BRING THEM TO THE OFFICE FOR PROCESSING.  PLEASE DO NOT GIVE THEM TO YOUR DOCTOR.  The clinic staff is available to answer your questions during regular business hours.  Please don't hesitate to call and ask to speak to one of the nurses if you have concerns.  [facility] [place] Office: [phone]  For further questions, please visit [email or web address]   COCOA BUTTER & VITAMIN E CREAM  (  [employer] or other brand)  Apply cocoa butter/vitamin E cream to your incision 2 - 3 times daily.  Massage cream into incision for one minute with each application.  Use sunscreen (50 SPF or higher) for first 6 months after surgery if area is exposed to sun.  You may substitute Mederma or other scar reducing creams as desired.

## 2012-12-05 ENCOUNTER — Ambulatory Visit (INDEPENDENT_AMBULATORY_CARE_PROVIDER_SITE_OTHER): Payer: BC Managed Care – PPO

## 2012-12-05 DIAGNOSIS — J309 Allergic rhinitis, unspecified: Secondary | ICD-10-CM | POA: Diagnosis not present

## 2012-12-09 ENCOUNTER — Ambulatory Visit (INDEPENDENT_AMBULATORY_CARE_PROVIDER_SITE_OTHER): Payer: BC Managed Care – PPO

## 2012-12-09 DIAGNOSIS — J309 Allergic rhinitis, unspecified: Secondary | ICD-10-CM | POA: Diagnosis not present

## 2012-12-12 ENCOUNTER — Telehealth (INDEPENDENT_AMBULATORY_CARE_PROVIDER_SITE_OTHER): Payer: Self-pay | Admitting: *Deleted

## 2012-12-12 NOTE — Telephone Encounter (Signed)
Patient called to ask about lab results.  Was able to track down patients CBC results from 11-29-12.  Patient called with lab results at this time.

## 2012-12-17 ENCOUNTER — Ambulatory Visit (INDEPENDENT_AMBULATORY_CARE_PROVIDER_SITE_OTHER): Payer: BC Managed Care – PPO

## 2012-12-17 DIAGNOSIS — J309 Allergic rhinitis, unspecified: Secondary | ICD-10-CM

## 2012-12-18 ENCOUNTER — Ambulatory Visit: Payer: BC Managed Care – PPO

## 2013-01-02 ENCOUNTER — Ambulatory Visit (INDEPENDENT_AMBULATORY_CARE_PROVIDER_SITE_OTHER): Payer: BC Managed Care – PPO

## 2013-01-02 DIAGNOSIS — J309 Allergic rhinitis, unspecified: Secondary | ICD-10-CM | POA: Diagnosis not present

## 2013-01-09 ENCOUNTER — Ambulatory Visit (INDEPENDENT_AMBULATORY_CARE_PROVIDER_SITE_OTHER): Payer: BC Managed Care – PPO

## 2013-01-09 DIAGNOSIS — J309 Allergic rhinitis, unspecified: Secondary | ICD-10-CM

## 2013-01-15 ENCOUNTER — Ambulatory Visit (INDEPENDENT_AMBULATORY_CARE_PROVIDER_SITE_OTHER): Payer: BC Managed Care – PPO

## 2013-01-15 DIAGNOSIS — J309 Allergic rhinitis, unspecified: Secondary | ICD-10-CM

## 2013-01-16 ENCOUNTER — Ambulatory Visit: Payer: BC Managed Care – PPO

## 2013-01-23 ENCOUNTER — Ambulatory Visit: Payer: BC Managed Care – PPO

## 2013-01-24 ENCOUNTER — Ambulatory Visit (INDEPENDENT_AMBULATORY_CARE_PROVIDER_SITE_OTHER): Payer: BC Managed Care – PPO

## 2013-01-24 DIAGNOSIS — J309 Allergic rhinitis, unspecified: Secondary | ICD-10-CM | POA: Diagnosis not present

## 2013-01-29 ENCOUNTER — Encounter (INDEPENDENT_AMBULATORY_CARE_PROVIDER_SITE_OTHER): Payer: Self-pay

## 2013-02-05 ENCOUNTER — Ambulatory Visit (INDEPENDENT_AMBULATORY_CARE_PROVIDER_SITE_OTHER): Payer: BC Managed Care – PPO

## 2013-02-05 DIAGNOSIS — J309 Allergic rhinitis, unspecified: Secondary | ICD-10-CM | POA: Diagnosis not present

## 2013-02-20 ENCOUNTER — Ambulatory Visit (INDEPENDENT_AMBULATORY_CARE_PROVIDER_SITE_OTHER): Payer: BC Managed Care – PPO

## 2013-02-20 DIAGNOSIS — J309 Allergic rhinitis, unspecified: Secondary | ICD-10-CM

## 2013-02-26 ENCOUNTER — Ambulatory Visit (INDEPENDENT_AMBULATORY_CARE_PROVIDER_SITE_OTHER): Payer: BC Managed Care – PPO | Admitting: Surgery

## 2013-02-26 ENCOUNTER — Encounter (INDEPENDENT_AMBULATORY_CARE_PROVIDER_SITE_OTHER): Payer: Self-pay | Admitting: Surgery

## 2013-02-26 VITALS — BP 104/60 | HR 84 | Temp 98.0°F | Resp 16 | Ht 60.0 in | Wt 97.2 lb

## 2013-02-26 DIAGNOSIS — R109 Unspecified abdominal pain: Secondary | ICD-10-CM

## 2013-02-26 NOTE — Progress Notes (Signed)
General Surgery [facility]  Visit Diagnoses: 1. Abdominal pain, left mid abdomen, chronic     HISTORY: The patient is a 67 year old female known to my practice from an episode of small bowel obstruction in March 2014. Patient underwent exploratory laparotomy and resection of a loop of ileum for closed loop obstruction. Postoperatively she did well and made a full recovery. However, the patient continues to have mid left abdominal discomfort which has become chronic in nature. It radiates across the midabdomen towards the right. She is also noted some tenderness and discomfort along her midline abdominal incision. She has a somewhat restricted her diet and does not eat fruits, vegetables, and salads due to worsening discomfort. She does have daily bowel movements which are not regular. She has been taking a stool softener frequently.  PERTINENT REVIEW OF SYSTEMS: Left-sided abdominal pain.  Irregular bowel movements.  EXAM: HEENT: normocephalic; pupils equal and reactive; sclerae clear; dentition good; mucous membranes moist NECK:  symmetric on extension; no palpable anterior or posterior cervical lymphadenopathy; no supraclavicular masses; no tenderness CHEST: clear to auscultation bilaterally without rales, rhonchi, or wheezes CARDIAC: regular rate and rhythm without significant murmur; peripheral pulses are full ABDOMEN: Soft without distention; bowel sounds present; midline surgical wound well healed with slight keloid formation; no sign of incisional hernia with Valsalva; no palpable masses; mild poorly localized abdominal tenderness in the left mid abdomen and right lower quadrant without guarding or rebound EXT:  non-tender without edema; no deformity NEURO: no gross focal deficits; no sign of tremor   IMPRESSION: Chronic abdominal pain of undetermined etiology  PLAN: I discussed these findings at length with the patient. We reviewed some anatomic drawings as  well as her previous operative report. I am going to schedule her for an upper GI series with small bowel follow-through. I would like to make sure there is not some anatomic cause for her symptoms. If this study is normal, then we will consider referral to gastroenterology for consultation for some functional abnormality. Hopefully the patient will not require any further surgical intervention at this time.  I will contact the patient with the results of her upper GI and small bowel follow-through series when the results are available.  [name], MD, [facility] Office: [phone]

## 2013-02-26 NOTE — Patient Instructions (Signed)
Apply topical hydrocortisone cream (over-the-counter) 2 midline abdominal incision once or twice daily.  [name], MD, [facility] Office: [phone]

## 2013-02-26 NOTE — Addendum Note (Signed)
Addended by: Joanette Gula on: 02/26/2013 11:50 AM   Modules accepted: Orders

## 2013-02-27 ENCOUNTER — Telehealth (INDEPENDENT_AMBULATORY_CARE_PROVIDER_SITE_OTHER): Payer: Self-pay | Admitting: General Surgery

## 2013-02-27 NOTE — Telephone Encounter (Signed)
Spoke with pt and informed her that she has a [facility] UGI sched. On 7/21 at 9:30.  Informed her she must be NPO after midnight.  She understood and was okay with this.

## 2013-02-28 ENCOUNTER — Other Ambulatory Visit: Payer: Self-pay | Admitting: Internal Medicine

## 2013-02-28 NOTE — Telephone Encounter (Signed)
Last OV with CDY: 12/28/11; was asked to f/u in 4 months No pending OV. Rx sent x 1 with instructions for OV with CDY

## 2013-03-03 ENCOUNTER — Ambulatory Visit (HOSPITAL_COMMUNITY)
Admission: RE | Admit: 2013-03-03 | Discharge: 2013-03-03 | Disposition: A | Discharge status: Home / Self Care | Payer: BC Managed Care – PPO | Source: Ambulatory Visit | Attending: Surgery | Admitting: Surgery

## 2013-03-03 DIAGNOSIS — R109 Unspecified abdominal pain: Secondary | ICD-10-CM | POA: Diagnosis not present

## 2013-03-04 ENCOUNTER — Telehealth (INDEPENDENT_AMBULATORY_CARE_PROVIDER_SITE_OTHER): Payer: Self-pay | Admitting: Surgery

## 2013-03-04 ENCOUNTER — Other Ambulatory Visit (INDEPENDENT_AMBULATORY_CARE_PROVIDER_SITE_OTHER): Payer: Self-pay

## 2013-03-04 DIAGNOSIS — R109 Unspecified abdominal pain: Secondary | ICD-10-CM

## 2013-03-04 NOTE — Telephone Encounter (Signed)
UGI and SBFT normal.  Discussed with patient.  Will arrange GI consultation with Dr. Bernette Redbird at [facility].  tmg  [name], MD, [facility] Office: [phone]

## 2013-03-05 ENCOUNTER — Telehealth (INDEPENDENT_AMBULATORY_CARE_PROVIDER_SITE_OTHER): Payer: Self-pay

## 2013-03-05 NOTE — Telephone Encounter (Signed)
Pt notified of appt with Dr Ewing Schlein 04-03-13 to arrive at 11:00am. Pt states this will work and she may call back to write info down since she is not at home today. Records have been faxed to Dr Ewing Schlein.

## 2013-03-06 ENCOUNTER — Ambulatory Visit (INDEPENDENT_AMBULATORY_CARE_PROVIDER_SITE_OTHER): Payer: BC Managed Care – PPO

## 2013-03-06 DIAGNOSIS — J309 Allergic rhinitis, unspecified: Secondary | ICD-10-CM

## 2013-03-13 ENCOUNTER — Ambulatory Visit: Payer: BC Managed Care – PPO

## 2013-03-27 ENCOUNTER — Ambulatory Visit (INDEPENDENT_AMBULATORY_CARE_PROVIDER_SITE_OTHER): Payer: BC Managed Care – PPO

## 2013-03-27 DIAGNOSIS — J309 Allergic rhinitis, unspecified: Secondary | ICD-10-CM

## 2013-04-03 ENCOUNTER — Ambulatory Visit: Payer: BC Managed Care – PPO

## 2013-04-10 ENCOUNTER — Ambulatory Visit (INDEPENDENT_AMBULATORY_CARE_PROVIDER_SITE_OTHER): Payer: BC Managed Care – PPO

## 2013-04-10 DIAGNOSIS — J309 Allergic rhinitis, unspecified: Secondary | ICD-10-CM | POA: Diagnosis not present

## 2013-04-17 ENCOUNTER — Ambulatory Visit: Payer: BC Managed Care – PPO

## 2013-04-22 ENCOUNTER — Ambulatory Visit (INDEPENDENT_AMBULATORY_CARE_PROVIDER_SITE_OTHER): Payer: BC Managed Care – PPO

## 2013-04-22 DIAGNOSIS — J309 Allergic rhinitis, unspecified: Secondary | ICD-10-CM

## 2013-05-02 ENCOUNTER — Ambulatory Visit (INDEPENDENT_AMBULATORY_CARE_PROVIDER_SITE_OTHER): Payer: BC Managed Care – PPO

## 2013-05-02 DIAGNOSIS — J309 Allergic rhinitis, unspecified: Secondary | ICD-10-CM

## 2013-05-15 ENCOUNTER — Ambulatory Visit (INDEPENDENT_AMBULATORY_CARE_PROVIDER_SITE_OTHER): Payer: BC Managed Care – PPO

## 2013-05-15 DIAGNOSIS — J309 Allergic rhinitis, unspecified: Secondary | ICD-10-CM

## 2013-05-20 ENCOUNTER — Other Ambulatory Visit: Payer: Self-pay | Admitting: Internal Medicine

## 2013-05-20 NOTE — Telephone Encounter (Signed)
Last seen 04-29-12; follow up in 4 months; no pending OVs, please advise. Thanks.

## 2013-05-20 NOTE — Telephone Encounter (Signed)
Ok to refill 

## 2013-06-02 ENCOUNTER — Ambulatory Visit (INDEPENDENT_AMBULATORY_CARE_PROVIDER_SITE_OTHER): Payer: BC Managed Care – PPO

## 2013-06-02 DIAGNOSIS — J309 Allergic rhinitis, unspecified: Secondary | ICD-10-CM | POA: Diagnosis not present

## 2013-06-03 ENCOUNTER — Ambulatory Visit (INDEPENDENT_AMBULATORY_CARE_PROVIDER_SITE_OTHER): Payer: BC Managed Care – PPO

## 2013-06-03 DIAGNOSIS — J309 Allergic rhinitis, unspecified: Secondary | ICD-10-CM

## 2013-06-12 ENCOUNTER — Ambulatory Visit: Payer: BC Managed Care – PPO

## 2013-06-13 ENCOUNTER — Ambulatory Visit (INDEPENDENT_AMBULATORY_CARE_PROVIDER_SITE_OTHER): Payer: BC Managed Care – PPO

## 2013-06-13 DIAGNOSIS — J309 Allergic rhinitis, unspecified: Secondary | ICD-10-CM | POA: Diagnosis not present

## 2013-06-24 ENCOUNTER — Ambulatory Visit (INDEPENDENT_AMBULATORY_CARE_PROVIDER_SITE_OTHER): Payer: BC Managed Care – PPO

## 2013-06-24 DIAGNOSIS — J309 Allergic rhinitis, unspecified: Secondary | ICD-10-CM

## 2013-07-04 ENCOUNTER — Ambulatory Visit (INDEPENDENT_AMBULATORY_CARE_PROVIDER_SITE_OTHER): Payer: BC Managed Care – PPO

## 2013-07-04 DIAGNOSIS — J309 Allergic rhinitis, unspecified: Secondary | ICD-10-CM | POA: Diagnosis not present

## 2013-07-11 ENCOUNTER — Ambulatory Visit: Payer: BC Managed Care – PPO

## 2013-07-16 ENCOUNTER — Encounter: Payer: Self-pay | Admitting: Internal Medicine

## 2013-07-23 ENCOUNTER — Ambulatory Visit (INDEPENDENT_AMBULATORY_CARE_PROVIDER_SITE_OTHER): Payer: BC Managed Care – PPO

## 2013-07-23 DIAGNOSIS — J309 Allergic rhinitis, unspecified: Secondary | ICD-10-CM

## 2013-07-30 ENCOUNTER — Ambulatory Visit (INDEPENDENT_AMBULATORY_CARE_PROVIDER_SITE_OTHER): Payer: BC Managed Care – PPO

## 2013-07-30 DIAGNOSIS — J309 Allergic rhinitis, unspecified: Secondary | ICD-10-CM | POA: Diagnosis not present

## 2013-08-19 ENCOUNTER — Ambulatory Visit (INDEPENDENT_AMBULATORY_CARE_PROVIDER_SITE_OTHER): Payer: BC Managed Care – PPO

## 2013-08-19 DIAGNOSIS — J309 Allergic rhinitis, unspecified: Secondary | ICD-10-CM | POA: Diagnosis not present

## 2013-08-29 ENCOUNTER — Ambulatory Visit: Payer: BC Managed Care – PPO

## 2013-09-08 ENCOUNTER — Inpatient Hospital Stay (HOSPITAL_COMMUNITY)
Admission: EM | Admit: 2013-09-08 | Discharge: 2013-09-09 | DRG: 603 | Disposition: A | Discharge status: Home / Self Care | Payer: BC Managed Care – PPO | Attending: Internal Medicine | Admitting: Internal Medicine

## 2013-09-08 ENCOUNTER — Emergency Department (HOSPITAL_COMMUNITY): Payer: BC Managed Care – PPO

## 2013-09-08 ENCOUNTER — Encounter (HOSPITAL_COMMUNITY): Payer: Self-pay | Admitting: Emergency Medicine

## 2013-09-08 DIAGNOSIS — Z8679 Personal history of other diseases of the circulatory system: Secondary | ICD-10-CM

## 2013-09-08 DIAGNOSIS — IMO0002 Reserved for concepts with insufficient information to code with codable children: Secondary | ICD-10-CM | POA: Diagnosis present

## 2013-09-08 DIAGNOSIS — M1990 Unspecified osteoarthritis, unspecified site: Secondary | ICD-10-CM | POA: Diagnosis present

## 2013-09-08 DIAGNOSIS — I891 Lymphangitis: Secondary | ICD-10-CM

## 2013-09-08 DIAGNOSIS — L02519 Cutaneous abscess of unspecified hand: Principal | ICD-10-CM | POA: Diagnosis present

## 2013-09-08 DIAGNOSIS — Z79899 Other long term (current) drug therapy: Secondary | ICD-10-CM | POA: Diagnosis not present

## 2013-09-08 DIAGNOSIS — S6980XA Other specified injuries of unspecified wrist, hand and finger(s), initial encounter: Secondary | ICD-10-CM | POA: Diagnosis not present

## 2013-09-08 DIAGNOSIS — L0390 Cellulitis, unspecified: Secondary | ICD-10-CM | POA: Diagnosis not present

## 2013-09-08 DIAGNOSIS — Z87891 Personal history of nicotine dependence: Secondary | ICD-10-CM | POA: Diagnosis not present

## 2013-09-08 DIAGNOSIS — I4891 Unspecified atrial fibrillation: Secondary | ICD-10-CM | POA: Diagnosis present

## 2013-09-08 DIAGNOSIS — L0291 Cutaneous abscess, unspecified: Secondary | ICD-10-CM | POA: Diagnosis not present

## 2013-09-08 DIAGNOSIS — S6990XA Unspecified injury of unspecified wrist, hand and finger(s), initial encounter: Secondary | ICD-10-CM | POA: Diagnosis not present

## 2013-09-08 DIAGNOSIS — L03019 Cellulitis of unspecified finger: Principal | ICD-10-CM | POA: Diagnosis present

## 2013-09-08 LAB — BASIC METABOLIC PANEL
BUN: 16 mg/dL (ref 6–23)
CO2: 21 mEq/L (ref 19–32)
Calcium: 9.4 mg/dL (ref 8.4–10.5)
Chloride: 95 mEq/L — ABNORMAL LOW (ref 96–112)
Creatinine, Ser: 0.69 mg/dL (ref 0.50–1.10)
GFR calc Af Amer: >90 mL/min (ref >90)
GFR calc non Af Amer: 89 mL/min — ABNORMAL LOW (ref >90)
Glucose, Bld: 101 mg/dL — ABNORMAL HIGH (ref 70–99)
Potassium: 4.5 mEq/L (ref 3.7–5.3)
Sodium: 132 mEq/L — ABNORMAL LOW (ref 137–147)

## 2013-09-08 LAB — CBC WITH DIFFERENTIAL/PLATELET
Basophils Absolute: 0 10*3/uL (ref 0.0–0.1)
Basophils Relative: 0 % (ref 0–1)
Eosinophils Absolute: 0 10*3/uL (ref 0.0–0.7)
Eosinophils Relative: 0 % (ref 0–5)
HCT: 39.6 % (ref 36.0–46.0)
Hemoglobin: 13.7 g/dL (ref 12.0–15.0)
Lymphocytes Relative: 17 % (ref 12–46)
Lymphs Abs: 2.1 10*3/uL (ref 0.7–4.0)
MCH: 29.8 pg (ref 26.0–34.0)
MCHC: 34.6 g/dL (ref 30.0–36.0)
MCV: 86.1 fL (ref 78.0–100.0)
Monocytes Absolute: 0.6 10*3/uL (ref 0.1–1.0)
Monocytes Relative: 5 % (ref 3–12)
Neutro Abs: 9.4 10*3/uL — ABNORMAL HIGH (ref 1.7–7.7)
Neutrophils Relative %: 78 % — ABNORMAL HIGH (ref 43–77)
Platelets: 245 10*3/uL (ref 150–400)
RBC: 4.6 MIL/uL (ref 3.87–5.11)
RDW: 13.2 % (ref 11.5–15.5)
WBC: 12.1 10*3/uL — ABNORMAL HIGH (ref 4.0–10.5)

## 2013-09-08 MED ORDER — MORPHINE SULFATE 4 MG/ML IJ SOLN
4.0000 mg | INTRAMUSCULAR | Status: DC | PRN
Start: 2013-09-08 — End: 2013-09-09
  Administered 2013-09-08 – 2013-09-09 (×3): 4 mg via INTRAVENOUS
  Filled 2013-09-08 (×3): qty 1

## 2013-09-08 MED ORDER — VANCOMYCIN HCL IN DEXTROSE 750-5 MG/150ML-% IV SOLN
750.0000 mg | INTRAVENOUS | Status: DC
  Filled 2013-09-08: qty 150

## 2013-09-08 MED ORDER — SPIRONOLACTONE 50 MG PO TABS
50.0000 mg | ORAL_TABLET | Freq: Every day | ORAL | Status: DC
  Filled 2013-09-08 (×2): qty 1

## 2013-09-08 MED ORDER — ENOXAPARIN SODIUM 30 MG/0.3ML ~~LOC~~ SOLN
30.0000 mg | SUBCUTANEOUS | Status: DC
  Administered 2013-09-08: 30 mg via SUBCUTANEOUS
  Filled 2013-09-08 (×3): qty 0.3

## 2013-09-08 MED ORDER — FLUTICASONE PROPIONATE 50 MCG/ACT NA SUSP
2.0000 | Freq: Every day | NASAL | Status: DC | PRN
  Filled 2013-09-08: qty 16

## 2013-09-08 MED ORDER — TETANUS-DIPHTH-ACELL PERTUSSIS 5-2.5-18.5 LF-MCG/0.5 IM SUSP
0.5000 mL | Freq: Once | INTRAMUSCULAR | Status: AC
  Administered 2013-09-08: 0.5 mL via INTRAMUSCULAR
  Filled 2013-09-08: qty 0.5

## 2013-09-08 MED ORDER — VANCOMYCIN HCL IN DEXTROSE 1-5 GM/200ML-% IV SOLN
1000.0000 mg | Freq: Once | INTRAVENOUS | Status: AC
  Administered 2013-09-08: 1000 mg via INTRAVENOUS
  Filled 2013-09-08: qty 200

## 2013-09-08 MED ORDER — LORAZEPAM 0.5 MG PO TABS: 0.2500 mg | ORAL_TABLET | Freq: Three times a day (TID) | ORAL | Status: DC | PRN

## 2013-09-08 MED ORDER — CARBOXYMETHYLCELL-HYPROMELLOSE 0.25-0.3 % OP GEL: 1.0000 "application " | Freq: Every day | OPHTHALMIC | Status: DC | PRN

## 2013-09-08 MED ORDER — SULFAMETHOXAZOLE-TRIMETHOPRIM 400-80 MG PO TABS
1.0000 | ORAL_TABLET | Freq: Two times a day (BID) | ORAL | Status: DC
  Administered 2013-09-08 – 2013-09-09 (×2): 1 via ORAL
  Filled 2013-09-08 (×3): qty 1

## 2013-09-08 MED ORDER — ACYCLOVIR 200 MG PO CAPS
200.0000 mg | ORAL_CAPSULE | Freq: Every day | ORAL | Status: DC
  Filled 2013-09-08: qty 1

## 2013-09-08 MED ORDER — POLYVINYL ALCOHOL 1.4 % OP SOLN
1.0000 [drp] | OPHTHALMIC | Status: DC | PRN
  Filled 2013-09-08: qty 15

## 2013-09-08 MED ORDER — ONDANSETRON HCL 4 MG/2ML IJ SOLN
4.0000 mg | Freq: Three times a day (TID) | INTRAMUSCULAR | Status: AC | PRN
  Administered 2013-09-08: 4 mg via INTRAVENOUS
  Filled 2013-09-08: qty 2

## 2013-09-08 MED ORDER — MORPHINE SULFATE 4 MG/ML IJ SOLN
4.0000 mg | Freq: Once | INTRAMUSCULAR | Status: AC
  Administered 2013-09-08: 4 mg via INTRAVENOUS
  Filled 2013-09-08: qty 1

## 2013-09-08 MED ORDER — ACETAMINOPHEN 325 MG PO TABS: 650.0000 mg | ORAL_TABLET | Freq: Four times a day (QID) | ORAL | Status: DC | PRN

## 2013-09-08 MED ORDER — ESTROGENS CONJUGATED 0.625 MG PO TABS
0.6250 mg | ORAL_TABLET | Freq: Every day | ORAL | Status: DC
  Filled 2013-09-08: qty 1

## 2013-09-08 MED ORDER — VANCOMYCIN HCL IN DEXTROSE 1-5 GM/200ML-% IV SOLN
1000.0000 mg | INTRAVENOUS | Status: DC
  Filled 2013-09-08: qty 200

## 2013-09-08 MED ORDER — ACETAMINOPHEN 650 MG RE SUPP: 650.0000 mg | Freq: Four times a day (QID) | RECTAL | Status: DC | PRN

## 2013-09-08 MED ORDER — ONDANSETRON HCL 4 MG/2ML IJ SOLN
4.0000 mg | Freq: Once | INTRAMUSCULAR | Status: AC
Start: 2013-09-08 — End: 2013-09-08
  Administered 2013-09-08: 4 mg via INTRAVENOUS
  Filled 2013-09-08: qty 2

## 2013-09-08 MED ORDER — TAMSULOSIN HCL 0.4 MG PO CAPS
0.4000 mg | ORAL_CAPSULE | Freq: Every day | ORAL | Status: DC
  Filled 2013-09-08 (×2): qty 1

## 2013-09-08 NOTE — ED Notes (Signed)
Pt returned from radiology.

## 2013-09-08 NOTE — ED Notes (Addendum)
Pt reports infection to right middle finger after laceration last Friday. Redness/swelling noted to area. Sent here for antibiotic treatment, possible admission. Spoke to Dr. Gala Lewandowsky office and referred to Dr. Daylene Katayama, who they stated was in the hospital today.

## 2013-09-08 NOTE — Consult Note (Signed)
Reason for Consult:right long dorsal soft tissue loss Referring Physician: Mcmanuss  Haley Wade is an 68 y.o. female.  HPI: s/p right long dorsal soft tissue loss 1/23 with chemical cauterization and no ABX now with mild erythema and WBC 12k  Past Medical History  Diagnosis Date  . Allergic rhinitis, cause unspecified   . Other chronic sinusitis   . Personal history of unspecified circulatory disease     Past Surgical History  Procedure Laterality Date  . Rhinoplasty    . Total abdominal hysterectomy    . Tonsillectomy    . Breast enhancement surgery      revisions  . Bladder surgery    . Laparoscopy N/A 11/01/2012    Procedure: LAPAROSCOPY DIAGNOSTIC;  Surgeon: Earnstine Regal, MD;  Location: [facility];  Service: General;  Laterality: N/A;  . Laparotomy N/A 11/01/2012    Procedure: EXPLORATORY LAPAROTOMY;  Surgeon: Earnstine Regal, MD;  Location: [facility];  Service: General;  Laterality: N/A;  . Lysis of adhesion N/A 11/01/2012    Procedure: LYSIS OF ADHESION;  Surgeon: Earnstine Regal, MD;  Location: [facility];  Service: General;  Laterality: N/A;  . Bowel resection N/A 11/01/2012    Procedure: SMALL BOWEL RESECTION;  Surgeon: Earnstine Regal, MD;  Location: [facility];  Service: General;  Laterality: N/A;    Family History  Problem Relation Age of Onset  . Other Mother     c difficile gastroenteritis  . Lung cancer Father     smoker  . Liver cancer Brother   . Asthma      grandmother  . Other      bronchitis-grandmother  . Liver disease Brother     Social History:  reports that she quit smoking about 33 years ago. Her smoking use included Cigarettes. She smoked 0.00 packs per day. She does not have any smokeless tobacco history on file. She reports that she does not drink alcohol or use illicit drugs.  Allergies:  Allergies  Allergen Reactions  . Bee Venom Swelling    Medications: Scheduled:  Results for orders placed during the hospital encounter of 09/08/13 (from the past  48 hour(s))  CBC WITH DIFFERENTIAL     Status: Abnormal   Collection Time    09/08/13  1:00 PM      Result Value Range   WBC 12.1 (*) 4.0 - 10.5 K/uL   RBC 4.60  3.87 - 5.11 MIL/uL   Hemoglobin 13.7  12.0 - 15.0 g/dL   HCT 39.6  36.0 - 46.0 %   MCV 86.1  78.0 - 100.0 fL   MCH 29.8  26.0 - 34.0 pg   MCHC 34.6  30.0 - 36.0 g/dL   RDW 13.2  11.5 - 15.5 %   Platelets 245  150 - 400 K/uL   Neutrophils Relative % 78 (*) 43 - 77 %   Neutro Abs 9.4 (*) 1.7 - 7.7 K/uL   Lymphocytes Relative 17  12 - 46 %   Lymphs Abs 2.1  0.7 - 4.0 K/uL   Monocytes Relative 5  3 - 12 %   Monocytes Absolute 0.6  0.1 - 1.0 K/uL   Eosinophils Relative 0  0 - 5 %   Eosinophils Absolute 0.0  0.0 - 0.7 K/uL   Basophils Relative 0  0 - 1 %   Basophils Absolute 0.0  0.0 - 0.1 K/uL  BASIC METABOLIC PANEL     Status: Abnormal   Collection Time  09/08/13  1:00 PM      Result Value Range   Sodium 132 (*) 137 - 147 mEq/L   Potassium 4.5  3.7 - 5.3 mEq/L   Chloride 95 (*) 96 - 112 mEq/L   CO2 21  19 - 32 mEq/L   Glucose, Bld 101 (*) 70 - 99 mg/dL   BUN 16  6 - 23 mg/dL   Creatinine, Ser 0.69  0.50 - 1.10 mg/dL   Calcium 9.4  8.4 - 10.5 mg/dL   GFR calc non Af Amer 89 (*) >90 mL/min   GFR calc Af Amer >90  >90 mL/min   Comment: (NOTE)     The eGFR has been calculated using the CKD EPI equation.     This calculation has not been validated in all clinical situations.     eGFR's persistently <90 mL/min signify possible Chronic Kidney     Disease.    Dg Finger Middle Right  09/08/2013   CLINICAL DATA:  FINGER INJURY  EXAM: RIGHT MIDDLE FINGER 2+V  COMPARISON:  None.  FINDINGS: There is no evidence of acute fracture or dislocation. 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 Soft tissues are unremarkable.  IMPRESSION: 1. No acute osseous injury of the right middle finger. 2. Osteoarthritis of the third PIP joint. 3. 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   Electronically Signed   By: Kathreen Devoid   On: 09/08/2013 13:55    Review of Systems  All other systems reviewed and are negative.   Blood pressure 130/96, pulse 89, temperature 99.4 F (37.4 C), temperature source Oral, resp. rate 20, height 4' 11"  (1.499 m), weight 44.906 kg (99 lb), SpO2 100.00%. Physical Exam  Constitutional: She is oriented to person, place, and time. She appears well-developed and well-nourished.  HENT:  Head: Normocephalic and atraumatic.  Cardiovascular: Normal rate.   Respiratory: Effort normal.  Musculoskeletal:       Right hand: She exhibits laceration.  Right long finger soft tissue loss dorsally over DIPJ with no exposed tendon or bone  Mild digital erythema  kanavels signs negative  Neurological: She is alert and oriented to person, place, and time.  Skin: Skin is warm. There is erythema.  Psychiatric: She has a normal mood and affect. Her behavior is normal. Judgment and thought content normal.    Assessment/Plan: As above  Would start BID wet to dry dressing changes and admit to medicine for IV ABX until WBC normalizes  Then can see in my office as outpatient  No surgical intervention needed at this time  [facility] A 09/08/2013, 2:56 PM

## 2013-09-08 NOTE — Progress Notes (Signed)
ANTIBIOTIC CONSULT NOTE - INITIAL  Pharmacy Consult for Vancomycin Indication: Cellulitis  Allergies  Allergen Reactions  . Bee Venom Swelling   Patient Measurements: Height: 4\' 11"  (149.9 cm) Weight: 99 lb (44.906 kg) IBW/kg (Calculated) : 43.2  Vital Signs: Temp: 98.7 F (37.1 C) (01/26 1829) Temp src: Oral (01/26 1336) BP: 146/57 mmHg (01/26 1829) Pulse Rate: 90 (01/26 1540)  Labs:  Recent Labs  09/08/13 1300  WBC 12.1*  HGB 13.7  PLT 245  CREATININE 0.69   Estimated Creatinine Clearance: 47.2 ml/min (by C-G formula based on Cr of 0.69).  Microbiology: No results found for this or any previous visit (from the past 720 hour(s)).  Medical History: Past Medical History  Diagnosis Date  . Allergic rhinitis, cause unspecified   . Other chronic sinusitis   . Personal history of unspecified circulatory disease     Medications:  Anti-infectives   Start     Dose/Rate Route Frequency Ordered Stop   09/09/13 1000  acyclovir (ZOVIRAX) 200 MG capsule 200 mg     200 mg Oral Daily 09/08/13 1820     09/09/13 0600  vancomycin (VANCOCIN) IVPB 1000 mg/200 mL premix     1,000 mg 200 mL/hr over 60 Minutes Intravenous Every 24 hours 09/08/13 1820     09/08/13 2200  sulfamethoxazole-trimethoprim (BACTRIM,SEPTRA) 400-80 MG per tablet 1 tablet     1 tablet Oral Every 12 hours 09/08/13 1820     09/08/13 1300  vancomycin (VANCOCIN) IVPB 1000 mg/200 mL premix     1,000 mg 200 mL/hr over 60 Minutes Intravenous  Once 09/08/13 1246 09/08/13 1422     Assessment: 68yo female, lean body mass with weight of 44.9Kg, presents with cellulitis of her hand.  She has a normal renal function with creatinine of 0.69 and an estimated crcl of 47 ml/min.  Total body weight will be used for dosing regimen.  She received 1gm. IV Vancomycin loading dose at ~ 13:00PM today without noted complications.    Goal of Therapy:  Vancomycin trough level 10-15 mcg/ml  Plan:  1.  Begin Vancomycin 750mg  IV  every 24 hours 2.  Monitor renal function and UOP adjusting dose as needed. 3.  F/U culture data and de-escalate antibiotics once able.  Rober Minion, PharmD., MS Clinical Pharmacist Pager:  [phone] Thank you for allowing pharmacy to be part of this patients care team. 09/08/2013,7:12 PM

## 2013-09-08 NOTE — H&P (Signed)
Haley Wade is an 68 y.o. female.   PCP:   Haley Reel, MD   Chief Complaint:  R Finger infection c arm and hand streaking cellulitis.  HPI: [name] sliced her finger Friday while pealing an onion c a mandoline slicer.  She had  right long dorsal soft tissue loss. She went to an fast-med urgent care 3 days ago where they stopped the bleeding/cauterized it and wrapped it up and d/c her home.  No ABX were given initially.  She went back to UC yesterday and prescribed bactrim and given an injection of rocephin  and was told to see her primary care provider or a surgeon today. She presented to Ed c increasing pain and erythema.  No Fever.  She had cellulitis streaking up arm c warmth and tenderness.  now with mild erythema and WBC 12k .  Give IV Vanco.   Dr Haley Wade has already seen pt. MSO4 given for pain. Requires admission.     Past Medical History:  Past Medical History  Diagnosis Date  . Allergic rhinitis, cause unspecified   . Other chronic sinusitis   . Personal history of unspecified circulatory disease     Past Surgical History  Procedure Laterality Date  . Rhinoplasty    . Total abdominal hysterectomy    . Tonsillectomy    . Breast enhancement surgery      revisions  . Bladder surgery    . Laparoscopy N/A 11/01/2012    Procedure: LAPAROSCOPY DIAGNOSTIC;  Surgeon: Haley Regal, MD;  Location: [facility];  Service: General;  Laterality: N/A;  . Laparotomy N/A 11/01/2012    Procedure: EXPLORATORY LAPAROTOMY;  Surgeon: Haley Regal, MD;  Location: [facility];  Service: General;  Laterality: N/A;  . Lysis of adhesion N/A 11/01/2012    Procedure: LYSIS OF ADHESION;  Surgeon: Haley Regal, MD;  Location: [facility];  Service: General;  Laterality: N/A;  . Bowel resection N/A 11/01/2012    Procedure: SMALL BOWEL RESECTION;  Surgeon: Haley Regal, MD;  Location: [facility];  Service: General;  Laterality: N/A;      Allergies:   Allergies  Allergen Reactions  . Bee Venom  Swelling     Medications: Prior to Admission medications   Medication Sig Start Date End Date Taking? Authorizing Provider  Carboxymethylcell-Hypromellose (GENTEAL) 0.25-0.3 % GEL Apply 1 application to eye daily as needed (for dry eyes).   Yes Historical Provider, MD  conjugated estrogens (PREMARIN) vaginal cream Place 0.5 g vaginally 2 (two) times a week. Patient states that uses this just whenever she remembers during the week no specific days to list.   Yes Historical Provider, MD  estrogens, conjugated, (PREMARIN) 0.625 MG tablet Take 0.625 mg by mouth daily. Take daily for 21 days then do not take for 7 days.   Yes Historical Provider, MD  fluticasone (FLONASE) 50 MCG/ACT nasal spray Place 2 sprays into both nostrils daily as needed for allergies or rhinitis.   Yes Historical Provider, MD  LORazepam (ATIVAN) 0.5 MG tablet Take 0.5-1 tablets by mouth every 8 (eight) hours as needed. Take 0.5 tablet twice a day Take 1 tablet at bedtime 01/27/11  Yes Historical Provider, MD  Methen-Bella-Meth Bl-Phen Sal (URISED PO) Take 1 tablet by mouth daily as needed (for bladder spasms).   Yes Historical Provider, MD  spironolactone (ALDACTONE) 100 MG tablet Take 0.5 tablets by mouth 3 (three) times daily.  01/27/11  Yes Historical Provider, MD  tamsulosin (FLOMAX) 0.4 MG  CAPS Take 0.4 mg by mouth at bedtime.    Yes Historical Provider, MD  ZOVIRAX 200 MG capsule Take 1 capsule by mouth daily. 01/11/11  Yes Historical Provider, MD      (Not in a hospital admission)   Social History:  reports that she quit smoking about 33 years ago. Her smoking use included Cigarettes. She smoked 0.00 packs per day. She does not have any smokeless tobacco history on file. She reports that she does not drink alcohol or use illicit drugs.  Family History: Family History  Problem Relation Age of Onset  . Other Mother     c difficile gastroenteritis  . Lung cancer Father     smoker  . Liver cancer Brother   .  Asthma      grandmother  . Other      bronchitis-grandmother  . Liver disease Brother     Review of Systems:  Review of Systems - besides R hand and finger and arm issues full ROS (-)  Physical Exam:  Blood pressure 125/81, pulse 90, temperature 99.4 F (37.4 C), temperature source Oral, resp. rate 20, height 4\' 11"  (1.499 m), weight 44.906 kg (99 lb), SpO2 99.00%. Filed Vitals:   09/08/13 1019 09/08/13 1336 09/08/13 1432 09/08/13 1540  BP: 138/72 145/105 130/96 125/81  Pulse: 100 99 89 90  Temp: 98.2 F (36.8 C) 99.4 F (37.4 C)    TempSrc: Oral Oral    Resp: 16 16 20    Height: 4\' 11"  (1.499 m)     Weight: 44.906 kg (99 lb)     SpO2: 96% 100% 100% 99%   General appearance: a and O Head: Normocephalic, without obvious abnormality, atraumatic Eyes: conjunctivae/corneas clear. PERRL, EOM's intact.  Nose: Nares normal. Septum midline. Mucosa normal. No drainage or sinus tenderness. Throat: lips, mucosa, and tongue normal; teeth and gums normal Neck: no adenopathy, no carotid bruit, no JVD and thyroid not enlarged, symmetric, no tenderness/mass/nodules Resp: CTA Cardio: Reg GI: soft, non-tender; bowel sounds normal; no masses,  no organomegaly Pulses: 2+ and symmetric Lymph nodes:  no cervical lymphadenopathy Neurologic: Alert and oriented X 3, normal strength and tone. Normal symmetric reflexes. Extremities: extremities normal, atraumatic, no cyanosis or edema Right dorsal 3rd finger with skin avulsion over dorsal DIP. Entire distal portion of finger is erythematous, edematous, tender to palpation. Sensation intact. Capillary refill < 2 seconds. Unable to flex DIP. Erythematous streak travels to mid forearm. The Streak is pink and warm       Labs on Admission:   Recent Labs  09/08/13 1300  NA 132*  K 4.5  CL 95*  CO2 21  GLUCOSE 101*  BUN 16  CREATININE 0.69  CALCIUM 9.4   No results found for this basename: AST, ALT, ALKPHOS, BILITOT, PROT, ALBUMIN,  in the  last 72 hours No results found for this basename: LIPASE, AMYLASE,  in the last 72 hours  Recent Labs  09/08/13 1300  WBC 12.1*  NEUTROABS 9.4*  HGB 13.7  HCT 39.6  MCV 86.1  PLT 245   No results found for this basename: CKTOTAL, CKMB, CKMBINDEX, TROPONINI,  in the last 72 hours No results found for this basename: INR, PROTIME     LAB RESULT POCT:  Results for orders placed during the hospital encounter of 09/08/13  CBC WITH DIFFERENTIAL      Result Value Range   WBC 12.1 (*) 4.0 - 10.5 K/uL   RBC 4.60  3.87 - 5.11 MIL/uL  Hemoglobin 13.7  12.0 - 15.0 g/dL   HCT 39.6  36.0 - 46.0 %   MCV 86.1  78.0 - 100.0 fL   MCH 29.8  26.0 - 34.0 pg   MCHC 34.6  30.0 - 36.0 g/dL   RDW 13.2  11.5 - 15.5 %   Platelets 245  150 - 400 K/uL   Neutrophils Relative % 78 (*) 43 - 77 %   Neutro Abs 9.4 (*) 1.7 - 7.7 K/uL   Lymphocytes Relative 17  12 - 46 %   Lymphs Abs 2.1  0.7 - 4.0 K/uL   Monocytes Relative 5  3 - 12 %   Monocytes Absolute 0.6  0.1 - 1.0 K/uL   Eosinophils Relative 0  0 - 5 %   Eosinophils Absolute 0.0  0.0 - 0.7 K/uL   Basophils Relative 0  0 - 1 %   Basophils Absolute 0.0  0.0 - 0.1 K/uL  BASIC METABOLIC PANEL      Result Value Range   Sodium 132 (*) 137 - 147 mEq/L   Potassium 4.5  3.7 - 5.3 mEq/L   Chloride 95 (*) 96 - 112 mEq/L   CO2 21  19 - 32 mEq/L   Glucose, Bld 101 (*) 70 - 99 mg/dL   BUN 16  6 - 23 mg/dL   Creatinine, Ser 0.69  0.50 - 1.10 mg/dL   Calcium 9.4  8.4 - 10.5 mg/dL   GFR calc non Af Amer 89 (*) >90 mL/min   GFR calc Af Amer >90  >90 mL/min      Radiological Exams on Admission: Dg Finger Middle Right  09/08/2013   CLINICAL DATA:  FINGER INJURY  EXAM: RIGHT MIDDLE FINGER 2+V  COMPARISON:  None.  FINDINGS: There is no evidence of acute fracture or dislocation. 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 Soft tissues are unremarkable.  IMPRESSION: 1. No acute  osseous injury of the right middle finger. 2. Osteoarthritis of the third PIP joint. 3. 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   Electronically Signed   By: Kathreen Devoid   On: 09/08/2013 13:55      No orders found for this or any previous visit.   Assessment/Plan Active Problems:   Cellulitis  Cellulitis and open Wound at finger. Admit/Pain control/IV Abx. Follow CBCs. BID wet to dry dressing changes Will need f/up at Hand as outpt.  Home meds where appropriate.  Follow BP  DVT Proph.  Td given already    Chinonso Linker M 09/08/2013, 5:52 PM

## 2013-09-08 NOTE — ED Notes (Signed)
Wet to dry dressing applied to patient wound. Plan of care discussed. Pt. Tolerated well.

## 2013-09-08 NOTE — ED Provider Notes (Signed)
CSN: [id number]     Arrival date & time 09/08/13  1005 History   First MD Initiated Contact with Patient 09/08/13 1222   This chart was scribed for Clayton Bibles PA-C, a non-physician practitioner working with Alfonzo Feller, DO by Denice Bors, ED Scribe. This patient was seen in room TR05C/TR05C and the patient's care was started at 12:31 PM     Chief Complaint  Patient presents with  . Finger Injury   (Consider location/radiation/quality/duration/timing/severity/associated sxs/prior Treatment) The history is provided by the patient. No language interpreter was used.   HPI Comments: ROZALYNN BUEGE is a 68 y.o. female who presents to the Emergency Department with PMHx of Atrial Fib, and arthritis complaining of right distal middle finger injury onset 3 days on a mandoline slicer. Reports she went to an fast-med urgent care 3 days ago where they stopped the bleeding, wrapped it up and d/c her home.  Woke up yesterday and unwrapped finger, found that it was erythematous, edematous, tender, and she was unable to bend it. Describes pain as 8/10 in severity, stinging, jabbing, and burning sensation. States she was "rescribed bactrim and given an injection of rocephin yesterday at second visit to urgent care and was told to see her primary care provider or a surgeon today.   She spoke with her general surgeon Dr Harlow Asa who recommended coming to ED for IV antibiotics and admission, recommends calling Dr Theodis Sato. Reports trying to elevate right hand with no relief of symptoms. And taking  3 doses bactrim as prescribed with no relief of symptoms. Reports associated limited ROM of right middle finger, chills, and right arm pain. Denies associated fever.  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 States tetanus is not up to date.  Denies PMHx of DM.   Past Medical History  Diagnosis Date  . Allergic rhinitis, cause unspecified   . Other chronic sinusitis   .  Personal history of unspecified circulatory disease    Past Surgical History  Procedure Laterality Date  . Rhinoplasty    . Total abdominal hysterectomy    . Tonsillectomy    . Breast enhancement surgery      revisions  . Bladder surgery    . Laparoscopy N/A 11/01/2012    Procedure: LAPAROSCOPY DIAGNOSTIC;  Surgeon: Earnstine Regal, MD;  Location: [facility];  Service: General;  Laterality: N/A;  . Laparotomy N/A 11/01/2012    Procedure: EXPLORATORY LAPAROTOMY;  Surgeon: Earnstine Regal, MD;  Location: [facility];  Service: General;  Laterality: N/A;  . Lysis of adhesion N/A 11/01/2012    Procedure: LYSIS OF ADHESION;  Surgeon: Earnstine Regal, MD;  Location: [facility];  Service: General;  Laterality: N/A;  . Bowel resection N/A 11/01/2012    Procedure: SMALL BOWEL RESECTION;  Surgeon: Earnstine Regal, MD;  Location: [facility];  Service: General;  Laterality: N/A;   Family History  Problem Relation Age of Onset  . Other Mother     c difficile gastroenteritis  . Lung cancer Father     smoker  . Liver cancer Brother   . Asthma      grandmother  . Other      bronchitis-grandmother  . Liver disease Brother    History  Substance Use Topics  . Smoking status: Former Smoker    Types: Cigarettes    Quit date: 08/14/1980  . Smokeless tobacco: Not on file  . Alcohol Use: No   OB History  Grav Para Term Preterm Abortions TAB SAB Ect Mult Living                 Review of Systems  Constitutional: Negative for fever and chills.  Gastrointestinal: Negative for vomiting.  Musculoskeletal: Negative for myalgias.  Skin: Positive for color change and wound.  Neurological: Negative for numbness.  Psychiatric/Behavioral: Negative for confusion.  All other systems reviewed and are negative.    Allergies  Bee venom  Home Medications   Current Outpatient Rx  Name  Route  Sig  Dispense  Refill  . acetaminophen (TYLENOL) 325 MG tablet   Oral   Take 2 tablets (650 mg total) by mouth every 6 (six) hours  as needed.         . conjugated estrogens (PREMARIN) vaginal cream   Vaginal   Place 0.5 g vaginally 2 (two) times a week. Patient states that uses this just whenever she remembers during the week no specific days to list.         . fluticasone (FLONASE) 50 MCG/ACT nasal spray      INSTILL 2 SPRAYS INTO THE NOSE DAILY   16 g   1   . LORazepam (ATIVAN) 0.5 MG tablet   Oral   Take 0.25-0.5 tablets by mouth every 8 (eight) hours as needed. Take 0.5 tablet twice a day Take 1 tablet at bedtime         . PREMARIN 0.625 MG tablet   Oral   Take 1 tablet by mouth Daily.         Marland Kitchen spironolactone (ALDACTONE) 100 MG tablet   Oral   Take 0.5 tablets by mouth 3 (three) times daily.          . tamsulosin (FLOMAX) 0.4 MG CAPS   Oral   Take 0.4 mg by mouth daily.         Marland Kitchen ZOVIRAX 200 MG capsule   Oral   Take 1 capsule by mouth daily.          BP 138/72  Pulse 100  Temp(Src) 98.2 F (36.8 C) (Oral)  Resp 16  Ht 4\' 11"  (1.499 m)  Wt 99 lb (44.906 kg)  BMI 19.98 kg/m2  SpO2 96% Physical Exam  Nursing note and vitals reviewed. Constitutional: She appears well-developed and well-nourished. No distress.  HENT:  Head: Normocephalic and atraumatic.  Neck: Neck supple.  Pulmonary/Chest: Effort normal.  Musculoskeletal:  Right dorsal 3rd finger with skin avulsion over dorsal DIP.  Entire distal portion of finger is erythematous, edematous, tender to palpation.  Sensation intact.  Capillary refill < 2 seconds. Unable to flex DIP. Erythematous streak travels to mid forearm.   Neurological: She is alert.  Skin: She is not diaphoretic.    ED Course  Procedures   COORDINATION OF CARE:  Nursing notes reviewed. Vital signs reviewed. Initial pt interview and examination performed.   12:31 PM-Discussed work up plan with pt at bedside, which includes x-ray of right middle finger. Pt agrees with plan. 12:52 PM Discussed pt with Dr. Thurnell Garbe . Treatment plan  initiated: Medications  ondansetron [facility]) injection 4 mg (4 mg Intravenous Given 09/08/13 1535)  morphine 4 MG/ML injection 4 mg (4 mg Intravenous Given 09/08/13 1535)  vancomycin (VANCOCIN) IVPB 1000 mg/200 mL premix (0 mg Intravenous Stopped 09/08/13 1422)  Tdap (BOOSTRIX) injection 0.5 mL (0.5 mLs Intramuscular Given 09/08/13 1309)  morphine 4 MG/ML injection 4 mg (4 mg Intravenous Given 09/08/13 1338)  ondansetron (ZOFRAN) injection 4 mg (  4 mg Intravenous Given 09/08/13 1338)     Initial diagnostic testing ordered.    Labs Review Labs Reviewed  CBC WITH DIFFERENTIAL - Abnormal; Notable for the following:    WBC 12.1 (*)    Neutrophils Relative % 78 (*)    Neutro Abs 9.4 (*)    All other components within normal limits  BASIC METABOLIC PANEL - Abnormal; Notable for the following:    Sodium 132 (*)    Chloride 95 (*)    Glucose, Bld 101 (*)    GFR calc non Af Amer 89 (*)    All other components within normal limits   Imaging Review Dg Finger Middle Right  09/08/2013   CLINICAL DATA:  FINGER INJURY  EXAM: RIGHT MIDDLE FINGER 2+V  COMPARISON:  None.  FINDINGS: There is no evidence of acute fracture or dislocation. 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 Soft tissues are unremarkable.  IMPRESSION: 1. No acute osseous injury of the right middle finger. 2. Osteoarthritis of the third PIP joint. 3. 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   Electronically Signed   By: Kathreen Devoid   On: 09/08/2013 13:55    EKG Interpretation   None      3:18 PM Admitted to Dr Virgina Jock.  MDM   1. Cellulitis, finger   2. Lymphangitis    Patient with injury to right middle finger, dorsal aspect 4 days ago now with wound infection, cellulitis and lymphatic spread.  Afebrile, nontoxic.  Pt seen in ED by Dr Burney Gauze who recommends medical admission (please see his note for further  details).  Dr Virgina Jock to admit patient.  IV Vancomycin started in ED.  No wound culture obtained as there was no drainage from the area.    I personally performed the services described in this documentation, which was scribed in my presence. The recorded information has been reviewed and is accurate.    Worth, PA-C 09/08/13 1641

## 2013-09-08 NOTE — ED Notes (Signed)
Patient ambulated to restroom and tolerated well.  

## 2013-09-09 ENCOUNTER — Encounter (HOSPITAL_COMMUNITY): Payer: Self-pay | Admitting: General Practice

## 2013-09-09 LAB — CBC
HCT: 35.1 % — ABNORMAL LOW (ref 36.0–46.0)
Hemoglobin: 12.2 g/dL (ref 12.0–15.0)
MCH: 29.5 pg (ref 26.0–34.0)
MCHC: 34.8 g/dL (ref 30.0–36.0)
MCV: 84.8 fL (ref 78.0–100.0)
Platelets: 235 10*3/uL (ref 150–400)
RBC: 4.14 MIL/uL (ref 3.87–5.11)
RDW: 12.8 % (ref 11.5–15.5)
WBC: 8.2 10*3/uL (ref 4.0–10.5)

## 2013-09-09 LAB — BASIC METABOLIC PANEL
BUN: 14 mg/dL (ref 6–23)
CO2: 21 mEq/L (ref 19–32)
Calcium: 8.9 mg/dL (ref 8.4–10.5)
Chloride: 100 mEq/L (ref 96–112)
Creatinine, Ser: 0.73 mg/dL (ref 0.50–1.10)
GFR calc Af Amer: >90 mL/min (ref >90)
GFR calc non Af Amer: 87 mL/min — ABNORMAL LOW (ref >90)
Glucose, Bld: 96 mg/dL (ref 70–99)
Potassium: 4.8 mEq/L (ref 3.7–5.3)
Sodium: 133 mEq/L — ABNORMAL LOW (ref 137–147)

## 2013-09-09 MED ORDER — ONDANSETRON HCL 4 MG PO TABS: 4.0000 mg | ORAL_TABLET | Freq: Three times a day (TID) | ORAL | Status: DC | PRN

## 2013-09-09 MED ORDER — ACETAMINOPHEN 325 MG PO TABS: 650.0000 mg | ORAL_TABLET | Freq: Four times a day (QID) | ORAL | Status: DC | PRN

## 2013-09-09 MED ORDER — SPIRONOLACTONE 50 MG PO TABS: 50.0000 mg | ORAL_TABLET | Freq: Every day | ORAL | Status: AC

## 2013-09-09 MED ORDER — HYDROCODONE-ACETAMINOPHEN 5-325 MG PO TABS: 1.0000 | ORAL_TABLET | Freq: Four times a day (QID) | ORAL | Status: DC | PRN

## 2013-09-09 MED ORDER — SULFAMETHOXAZOLE-TRIMETHOPRIM 400-80 MG PO TABS: 1.0000 | ORAL_TABLET | Freq: Two times a day (BID) | ORAL | Status: DC

## 2013-09-09 MED ORDER — VANCOMYCIN HCL IN DEXTROSE 750-5 MG/150ML-% IV SOLN
750.0000 mg | Freq: Once | INTRAVENOUS | Status: AC
  Administered 2013-09-09: 750 mg via INTRAVENOUS
  Filled 2013-09-09 (×2): qty 150

## 2013-09-09 NOTE — Progress Notes (Signed)
Patient provided with discharge instructions and follow up information. She is following up this afternoon at Dr Francella Solian office for whirlpool therapy. Patient will be on Bactrim PO at home.

## 2013-09-09 NOTE — Progress Notes (Signed)
Patient provided with Dr Bertis Ruddy office number to call regarding whirlpool treatment today after discharge. She will be discharged after 1 dose of IV Vancomycin.

## 2013-09-09 NOTE — Progress Notes (Signed)
Subjective: Admitted c Finger wound and cellulitis streaking up R arm. On IV Vanco and PO bactrim Has seen Hand No Fevers Less pain but still painful. Less warm. Still limited movement  Objective: Vital signs in last 24 hours: Temp:  [98.2 F (36.8 C)-99.4 F (37.4 C)] 98.4 F (36.9 C) (01/27 0518) Pulse Rate:  [69-100] 86 (01/27 0518) Resp:  [15-20] 18 (01/27 0518) BP: (117-146)/(57-105) 117/61 mmHg (01/27 0518) SpO2:  [96 %-100 %] 99 % (01/27 0518) Weight:  [44.906 kg (99 lb)] 44.906 kg (99 lb) (01/26 1019) Weight change:     CBG (last 3)  No results found for this basename: GLUCAP,  in the last 72 hours  Intake/Output from previous day: No intake or output data in the 24 hours ending 09/09/13 0729     Physical Exam  General appearance: A and O Eyes: no scleral icterus Throat: oropharynx moist without erythema Resp: CTA Cardio: Reg GI: soft, non-tender; bowel sounds normal; no masses,  no organomegaly Extremities: no clubbing, cyanosis or edema Right dorsal 3rd finger with skin avulsion over dorsal DIP. Less distal erythema and less edema. Less tender to palpation. Sensation intact. Capillary refill < 2 seconds. Unable to flex DIP. Erythematous streak travels to mid forearm but improving. The Streak is pink and less warm.    Lab Results:  Recent Labs  09/08/13 1300 09/09/13 0617  NA 132* 133*  K 4.5 4.8  CL 95* 100  CO2 21 21  GLUCOSE 101* 96  BUN 16 14  CREATININE 0.69 0.73  CALCIUM 9.4 8.9    No results found for this basename: AST, ALT, ALKPHOS, BILITOT, PROT, ALBUMIN,  in the last 72 hours   Recent Labs  09/08/13 1300 09/09/13 0617  WBC 12.1* 8.2  NEUTROABS 9.4*  --   HGB 13.7 12.2  HCT 39.6 35.1*  MCV 86.1 84.8  PLT 245 235    No results found for this basename: INR, PROTIME    No results found for this basename: CKTOTAL, CKMB, CKMBINDEX, TROPONINI,  in the last 72 hours  No results found for this basename: TSH, T4TOTAL, FREET3,  T3FREE, THYROIDAB,  in the last 72 hours  No results found for this basename: VITAMINB12, FOLATE, FERRITIN, TIBC, IRON, RETICCTPCT,  in the last 72 hours  Micro Results: No results found for this or any previous visit (from the past 240 hour(s)).   Studies/Results: Dg Finger Middle Right  09/08/2013   CLINICAL DATA:  FINGER INJURY  EXAM: RIGHT MIDDLE FINGER 2+V  COMPARISON:  None.  FINDINGS: There is no evidence of acute fracture or dislocation. 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 Soft tissues are unremarkable.  IMPRESSION: 1. No acute osseous injury of the right middle finger. 2. Osteoarthritis of the third PIP joint. 3. 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   Electronically Signed   By: Kathreen Devoid   On: 09/08/2013 13:55     Medications: Scheduled: . acyclovir  200 mg Oral Daily  . enoxaparin (LOVENOX) injection  30 mg Subcutaneous Q24H  . estrogens (conjugated)  0.625 mg Oral Daily  . spironolactone  50 mg Oral Daily  . sulfamethoxazole-trimethoprim  1 tablet Oral Q12H  . tamsulosin  0.4 mg Oral QHS  . vancomycin  750 mg Intravenous Q24H   Continuous:    Assessment/Plan: Active Problems:   Cellulitis  R Finger infection c arm and hand  streaking cellulitis. 20-30% improvement from last night. Continue Pain control. IV Vanco and PO Bactrim. WBC already better. No Fevers W-D Dressings. X ray was fine. Will need f/up at Hand as outpt when D/ced.  Will see what Dr Burney Gauze says when he sees her today. Anticipate D/c later today or Tomorrow.  More likely tom am Home meds where appropriate.  BP is fine. DVT Proph.  Td given already    ID -  Anti-infectives   Start     Dose/Rate Route Frequency Ordered Stop   09/09/13 1300  vancomycin (VANCOCIN) IVPB 750 mg/150 ml premix     750 mg 150 mL/hr over 60 Minutes Intravenous Every 24 hours 09/08/13  1919     09/09/13 1000  acyclovir (ZOVIRAX) 200 MG capsule 200 mg     200 mg Oral Daily 09/08/13 1820     09/09/13 0600  vancomycin (VANCOCIN) IVPB 1000 mg/200 mL premix  Status:  Discontinued     1,000 mg 200 mL/hr over 60 Minutes Intravenous Every 24 hours 09/08/13 1820 09/08/13 1918   09/08/13 2200  sulfamethoxazole-trimethoprim (BACTRIM,SEPTRA) 400-80 MG per tablet 1 tablet     1 tablet Oral Every 12 hours 09/08/13 1820     09/08/13 1930  vancomycin (VANCOCIN) IVPB 750 mg/150 ml premix  Status:  Discontinued     750 mg 150 mL/hr over 60 Minutes Intravenous Every 24 hours 09/08/13 1918 09/08/13 1919   09/08/13 1300  vancomycin (VANCOCIN) IVPB 1000 mg/200 mL premix     1,000 mg 200 mL/hr over 60 Minutes Intravenous  Once 09/08/13 1246 09/08/13 1422       LOS: 1 day   Haley Wade M 09/09/2013, 7:29 AM

## 2013-09-09 NOTE — Discharge Summary (Signed)
Physician Discharge Summary  DISCHARGE SUMMARY   Patient ID: Haley Wade MR#: [id number] DOB/AGE: 1946-05-19 68 y.o.   Attending 46 M  Patient's [id number] M, MD  Consults:  Hand.  Dr Burney Gauze, MD  Admit date: 09/08/2013 Discharge date: 09/09/2013  Discharge Diagnoses:  Active Problems:   Cellulitis   Patient Active Problem List   Diagnosis Date Noted  . Cellulitis 09/08/2013  . Abdominal pain, left mid abdomen, chronic 02/26/2013  . Hyponatremia 10/30/2012  . COPD, questioned 01/02/2012  . Allergic rhinitis due to pollen 10/20/2010  . MITRAL VALVE PROLAPSE, HX OF 05/26/2009  . UNSPECIFIED HEARING LOSS 05/21/2009  . RHINOSINUSITIS, 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   Past Medical History  Diagnosis Date  . Allergic rhinitis, cause unspecified   . Other chronic sinusitis   . Personal history of unspecified circulatory disease     Discharged Condition:    Discharge Medications:   Medication List         acetaminophen 325 MG tablet  Commonly known as:  TYLENOL  Take 2 tablets (650 mg total) by mouth every 6 (six) hours as needed for mild pain (or Fever >/= 101).     conjugated estrogens vaginal cream  Commonly known as:  PREMARIN  Place 0.5 g vaginally 2 (two) times a week. Patient states that uses this just whenever she remembers during the week no specific days to list.     estrogens (conjugated) 0.625 MG tablet  Commonly known as:  PREMARIN  Take 0.625 mg by mouth daily. Take daily for 21 days then do not take for 7 days.     fluticasone 50 MCG/ACT nasal spray  Commonly known as:  FLONASE  Place 2 sprays into both nostrils daily as needed for allergies or rhinitis.     GENTEAL 0.25-0.3 % Gel  Generic drug:  Carboxymethylcell-Hypromellose  Apply 1 application to eye daily as needed (for dry eyes).     HYDROcodone-acetaminophen 5-325 MG per tablet  Commonly known as:  NORCO/VICODIN  Take 1 tablet by mouth every 6 (six) hours as needed  for moderate pain.     LORazepam 0.5 MG tablet  Commonly known as:  ATIVAN  - Take 0.5-1 tablets by mouth every 8 (eight) hours as needed. Take 0.5 tablet twice a day  - Take 1 tablet at bedtime     ondansetron 4 MG tablet  Commonly known as:  ZOFRAN  Take 1 tablet (4 mg total) by mouth every 8 (eight) hours as needed for nausea or vomiting.     spironolactone 50 MG tablet  Commonly known as:  ALDACTONE  Take 1 tablet (50 mg total) by mouth daily.     sulfamethoxazole-trimethoprim 400-80 MG per tablet  Commonly known as:  BACTRIM,SEPTRA  Take 1 tablet by mouth every 12 (twelve) hours.     tamsulosin 0.4 MG Caps capsule  Commonly known as:  FLOMAX  Take 0.4 mg by mouth at bedtime.     URISED PO  Take 1 tablet by mouth daily as needed (for bladder spasms).     ZOVIRAX 200 MG capsule  Generic drug:  acyclovir  Take 1 capsule by mouth daily.        Hospital Procedures: Dg Finger Middle Right  09/08/2013   CLINICAL DATA:  FINGER INJURY  EXAM: RIGHT MIDDLE FINGER 2+V  COMPARISON:  None.  FINDINGS: There is no evidence of acute fracture or dislocation. 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 There is bridging heterotopic ossification along the dorsal aspect of  the DIP joint. Soft tissues are unremarkable.  IMPRESSION: 1. No acute osseous injury of the right middle finger. 2. Osteoarthritis of the third PIP joint. 3. 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   Electronically Signed   By: Kathreen Devoid   On: 09/08/2013 13:55    History of Present Illness: Healthy 8 F who sliced her finger Friday 1/23 while pealing an onion c a mandoline slicer. She had right long dorsal soft tissue loss. She went to an [facility] urgent care 3 days PTA where they stopped the bleeding/cauterized it and wrapped it up and d/c her home. No ABX were given initially. She went back to [facility] Sunday 1/25 and was prescribed bactrim and given an injection of rocephin and was told to  see her primary care provider or a surgeon today. She called Dr Harlow Asa who sent her to the ED.  She presented to the ED c increasing pain and erythema. No Fever. She had cellulitis streaking up arm c warmth and tenderness. Now with mild erythema and WBC 12k . Given IV Vanco.  Dr Burney Gauze saw pt.  MSO4 given for pain.  Was admitted.   Hospital Course:  Admitted c Finger wound and cellulitis streaking up R arm.  On IV Vanco and PO bactrim  Had seen Hand in the ED No Fevers  This am Less pain but still painful and less warm.  Still limited movement R Finger infection c arm and hand streaking cellulitis. 20-30% improvement from last night.  Continue Pain control and Morphine changed to PO Hydrocodone.  IV Vanco was given again and she will continue on PO Bactrim.  After I got to my office I received a call from Dr Burney Gauze saying that it was OK to D/c pt and that she had to go immediately to his office to start [facility] therapy.  i called the pt and updated her on the plan.  i gave the extra dose of the IV Vanco that I stated above.  After the Vanco I gave the nurse the order that if all of Ms lange's questions were answered she would be ready for D/c.  i also gave the pt Dr Bertis Ruddy # and address and asked her to call them to be sure of the plan as well  WBC was better this am  W-D Dressings were being provided.  X ray was fine.   She was D/ced and told to resume home meds where appropriate.  Zofran and Hydrocodone rxs were written BP was fine.  DVT Proph was provided.  Td given.   Day of Discharge Exam BP 117/61  Pulse 86  Temp(Src) 98.4 F (36.9 C) (Oral)  Resp 18  Ht 4\' 11"  (1.499 m)  Wt 44.906 kg (99 lb)  BMI 19.98 kg/m2  SpO2 99%  Physical Exam: See PE on this am Note  Discharge Labs:  Recent Labs  09/08/13 1300 09/09/13 0617  NA 132* 133*  K 4.5 4.8  CL 95* 100  CO2 21 21  GLUCOSE 101* 96  BUN 16 14  CREATININE 0.69 0.73  CALCIUM 9.4 8.9   No results  found for this basename: AST, ALT, ALKPHOS, BILITOT, PROT, ALBUMIN,  in the last 72 hours  Recent Labs  09/08/13 1300 09/09/13 0617  WBC 12.1* 8.2  NEUTROABS 9.4*  --   HGB 13.7 12.2  HCT 39.6 35.1*  MCV 86.1 84.8  PLT 245 235   No results found for this basename: CKTOTAL,  CKMB, CKMBINDEX, TROPONINI,  in the last 72 hours No results found for this basename: TSH, T4TOTAL, FREET3, T3FREE, THYROIDAB,  in the last 72 hours No results found for this basename: VITAMINB12, FOLATE, FERRITIN, TIBC, IRON, RETICCTPCT,  in the last 72 hours No results found for this basename: INR, PROTIME       Discharge instructions:  01-Home or Self Care    Disposition: Home  Follow-up Appts: Follow-up with Dr. Virgina Jock at [facility] as scheduled for [facility].  Go to Hand center after D/c  Condition on Discharge: stable  Tests Needing Follow-up: none  Time spent in discharge (includes decision making & examination of pt): < 30 min  Signed: Cordie Buening M 09/09/2013, 9:23 AM

## 2013-09-09 NOTE — ED Provider Notes (Signed)
Medical screening examination/treatment/procedure(s) were conducted as a shared visit with non-physician practitioner(s) and myself.  I personally evaluated the patient during the encounter.  68yo F, c/o right dorsal distal middle finger injury 3 days ago. States she cut herself on a mandoline slicer, went to a local [facility], had finger dressed, d/c. Yesterday unwrapped the dressing and noted the area red, swollen, tender. Went back to [facility], received rx bactrim after IM rocephin. Today she saw her General Surgeon Dr. Harlow Asa who recommended she come to the ED for evaluation by Hand Dr. Daylene Katayama. Afebrile, VSS, CTA, RRR, +right dorsal distal middle finger skin avulsion/open wound, no drainage, with surrounding edema, erythema and red streaking up right forearm. No exposed bone or tendon. WBC count elevated. XR without acute finding. IV abx, consult Hand MD, admit.        Alfonzo Feller, DO 09/09/13 [phone]

## 2013-09-11 ENCOUNTER — Ambulatory Visit (INDEPENDENT_AMBULATORY_CARE_PROVIDER_SITE_OTHER): Payer: BC Managed Care – PPO

## 2013-09-11 DIAGNOSIS — J309 Allergic rhinitis, unspecified: Secondary | ICD-10-CM

## 2013-09-12 ENCOUNTER — Encounter: Payer: Self-pay | Admitting: Internal Medicine

## 2013-10-03 ENCOUNTER — Ambulatory Visit (INDEPENDENT_AMBULATORY_CARE_PROVIDER_SITE_OTHER): Payer: BC Managed Care – PPO

## 2013-10-03 DIAGNOSIS — J309 Allergic rhinitis, unspecified: Secondary | ICD-10-CM

## 2013-10-20 ENCOUNTER — Ambulatory Visit (INDEPENDENT_AMBULATORY_CARE_PROVIDER_SITE_OTHER): Payer: BC Managed Care – PPO

## 2013-10-20 DIAGNOSIS — J309 Allergic rhinitis, unspecified: Secondary | ICD-10-CM

## 2013-10-31 ENCOUNTER — Ambulatory Visit (INDEPENDENT_AMBULATORY_CARE_PROVIDER_SITE_OTHER): Payer: BC Managed Care – PPO

## 2013-10-31 DIAGNOSIS — J309 Allergic rhinitis, unspecified: Secondary | ICD-10-CM

## 2013-11-07 ENCOUNTER — Ambulatory Visit: Payer: BC Managed Care – PPO

## 2013-11-10 ENCOUNTER — Ambulatory Visit (INDEPENDENT_AMBULATORY_CARE_PROVIDER_SITE_OTHER): Payer: BC Managed Care – PPO

## 2013-11-10 DIAGNOSIS — J309 Allergic rhinitis, unspecified: Secondary | ICD-10-CM | POA: Diagnosis not present

## 2013-11-20 ENCOUNTER — Ambulatory Visit (INDEPENDENT_AMBULATORY_CARE_PROVIDER_SITE_OTHER): Payer: BC Managed Care – PPO

## 2013-11-20 DIAGNOSIS — J309 Allergic rhinitis, unspecified: Secondary | ICD-10-CM | POA: Diagnosis not present

## 2013-11-28 ENCOUNTER — Ambulatory Visit (INDEPENDENT_AMBULATORY_CARE_PROVIDER_SITE_OTHER): Payer: BC Managed Care – PPO

## 2013-11-28 DIAGNOSIS — J309 Allergic rhinitis, unspecified: Secondary | ICD-10-CM | POA: Diagnosis not present

## 2013-12-05 ENCOUNTER — Ambulatory Visit (INDEPENDENT_AMBULATORY_CARE_PROVIDER_SITE_OTHER): Payer: BC Managed Care – PPO

## 2013-12-05 DIAGNOSIS — J309 Allergic rhinitis, unspecified: Secondary | ICD-10-CM

## 2013-12-11 ENCOUNTER — Ambulatory Visit (INDEPENDENT_AMBULATORY_CARE_PROVIDER_SITE_OTHER): Payer: BC Managed Care – PPO

## 2013-12-11 DIAGNOSIS — J309 Allergic rhinitis, unspecified: Secondary | ICD-10-CM | POA: Diagnosis not present

## 2013-12-25 ENCOUNTER — Ambulatory Visit (INDEPENDENT_AMBULATORY_CARE_PROVIDER_SITE_OTHER): Payer: BC Managed Care – PPO

## 2013-12-25 DIAGNOSIS — J309 Allergic rhinitis, unspecified: Secondary | ICD-10-CM

## 2013-12-26 ENCOUNTER — Encounter: Payer: Self-pay | Admitting: Internal Medicine

## 2013-12-26 ENCOUNTER — Ambulatory Visit (INDEPENDENT_AMBULATORY_CARE_PROVIDER_SITE_OTHER): Payer: BC Managed Care – PPO | Admitting: Internal Medicine

## 2013-12-26 VITALS — BP 122/80 | HR 87 | Ht 60.0 in | Wt 101.8 lb

## 2013-12-26 DIAGNOSIS — J449 Chronic obstructive pulmonary disease, unspecified: Secondary | ICD-10-CM

## 2013-12-26 DIAGNOSIS — J301 Allergic rhinitis due to pollen: Secondary | ICD-10-CM

## 2013-12-26 MED ORDER — AZELASTINE-FLUTICASONE 137-50 MCG/ACT NA SUSP
1.0000 | Freq: Every day | NASAL | Status: DC
Start: 2013-12-26 — End: 2014-01-14

## 2013-12-26 MED ORDER — AZELASTINE-FLUTICASONE 137-50 MCG/ACT NA SUSP: NASAL | Status: DC

## 2013-12-26 NOTE — Progress Notes (Signed)
Subjective:    Patient ID: Haley Wade, female    DOB: 08-26-1945, 68 y.o.   MRN: [id number]  HPI 03/09/11- 28 yo former smoker followed for allergic rhinitis, hx chronic rhinosinusitis Last couple of days she has felt some left sided sinus and ear pressure. Hearing ok. Blows nose and sneezes just on morning waking with a little cough. Not much different outside. Difficult to get here from work for allergy vaccine. We discussed protocols and allergy lab hours again. Marland Kitchen Has not needed zyrtec or flonase.   12/28/11- 89 yo former smoker followed for allergic rhinitis, hx chronic rhinosinusitis, ? COPD self referral; Already seen for Allergies and on vaccine New problem: This summer she had palpitations and had cardiology evaluation by Dr. Tamala Julian for mitral valve prolapse with regurgitation. During evaluation, chest x-ray showed hyperinflation suggesting COPD. She had smoked very little many years ago. Rule out a smoking family. No history of asthma. She has been very athletic as a runner and bike rider with no concern about exertional dyspnea.  12/26/13- 59 yo former smoker followed for allergic rhinitis, hx chronic rhinosinusitis, ? COPD, complicated by mitral valve disase FOLLOWS FOR:  Sinus pressure and pnd x1 month.  Discuss referral to [facility] for allergy injections due to pt moving to [place]. Had bad cold in March- Zpak per ENT. Still much post nasal drip-clear. Discussed transition of care. She has been on allergy vaccine here since 2010 for grass, oak, dust mite, dog, mold mix. No reactions.  No PFT since EMR change in 2012. Exercising regularly w/o DOE, cough or wheeze.  ROS-see HPI Constitutional:   No-   weight loss, night sweats, fevers, chills, fatigue, lassitude. HEENT:   No-  headaches, difficulty swallowing, tooth/dental problems, sore throat,       No- sneezing, itching, ear ache, nasal congestion, +post nasal drip,  CV:  No-   chest pain, orthopnea, PND, swelling in lower  extremities, anasarca,  dizziness, palpitations Resp: No-   shortness of breath with exertion or at rest.              No-   productive cough,  No non-productive cough,  No- coughing up of blood.              No-   change in color of mucus.  No- wheezing.   Skin: No-   rash or lesions. GI:  No-   heartburn, indigestion, abdominal pain, nausea, vomiting,  GU:  MS:  No-   joint pain or swelling.   Neuro-     nothing unusual Psych:  No- change in mood or affect. No depression or anxiety.  No memory loss.   Objective:   Physical Exam General- Alert, Oriented, Affect-appropriate, Distress- none acute,   slender Skin- rash-none, lesions- none, excoriation- none Lymphadenopathy- none Head- atraumatic            Eyes- Gross vision intact, PERRLA, conjunctivae clear secretions            Ears- Hearing, canals  Good light reflex,no retraction or erythema     + right hearing aid            Nose- Clear, no- Septal dev, mucus, polyps, erosion, perforation             Throat- Mallampati III , mucosa clear , drainage- none, tonsils- atrophic, +throat clearing Neck- flexible , trachea midline, no stridor , thyroid nl, carotid no bruit, external jugular veins full Chest - symmetrical excursion , unlabored  Heart/CV- RRR , no murmur , no gallop  , no rub, nl s1 s2                           - JVD- none , edema- none, stasis changes- none, varices- none           Lung- clear to P&A, wheeze- none, cough- none , dullness-none, rub- none           Chest wall-  Abd-  Br/ Gen/ Rectal- Not done, not indicated Extrem- cyanosis- none, clubbing, none, atrophy- none, strength- nl Neuro- grossly intact to observation   Assessment & Plan:

## 2013-12-26 NOTE — Patient Instructions (Addendum)
Sample and printed script for Dymista nasal spray      Try 1-2 puffs each nostril once daily at bedtime  Check on allergist availability through [facility], if your insurance agrees. This could be a good time to stop allergy shots, see how you do without.   Please call as needed

## 2014-01-02 ENCOUNTER — Ambulatory Visit (INDEPENDENT_AMBULATORY_CARE_PROVIDER_SITE_OTHER): Payer: BC Managed Care – PPO

## 2014-01-02 DIAGNOSIS — J309 Allergic rhinitis, unspecified: Secondary | ICD-10-CM

## 2014-01-13 ENCOUNTER — Encounter: Payer: Self-pay | Admitting: Internal Medicine

## 2014-01-14 ENCOUNTER — Telehealth: Payer: Self-pay | Admitting: *Deleted

## 2014-01-14 MED ORDER — AZELASTINE HCL 0.1 % NA SOLN: NASAL | Status: DC

## 2014-01-14 NOTE — Telephone Encounter (Signed)
Pt aware of change to meds and will pick up Rx as well as OTC Flonase. Nothing more needed at this time.

## 2014-01-14 NOTE — Telephone Encounter (Signed)
lmomtcb x1 for pt Will forward to [place] per protocol as this message was not created by our schedulers for triage.

## 2014-01-14 NOTE — Telephone Encounter (Signed)
Rather than [place] spending time on PA for that, I recommend we Rx Astelin, # 1, 1-2 puffs plus otc Flonase 1-2 puffs each nostril once daily at bedtime. This copies the ingredients and will be cheaper.

## 2014-01-14 NOTE — Telephone Encounter (Signed)
dymista 137/50 1-2 spray each nostril at bedtime as needed PA # [phone] ID # [id number] Please advise Dr. Annamaria Boots if you want [place] to call for PA thanks

## 2014-01-15 ENCOUNTER — Ambulatory Visit (INDEPENDENT_AMBULATORY_CARE_PROVIDER_SITE_OTHER): Payer: BC Managed Care – PPO

## 2014-01-15 DIAGNOSIS — J309 Allergic rhinitis, unspecified: Secondary | ICD-10-CM

## 2014-01-20 ENCOUNTER — Ambulatory Visit (HOSPITAL_COMMUNITY): Payer: BC Managed Care – PPO | Attending: Cardiovascular Disease | Admitting: Radiology

## 2014-01-20 ENCOUNTER — Other Ambulatory Visit (HOSPITAL_COMMUNITY): Payer: Self-pay | Admitting: Internal Medicine

## 2014-01-20 DIAGNOSIS — I452 Bifascicular block: Secondary | ICD-10-CM

## 2014-01-20 DIAGNOSIS — I4510 Unspecified right bundle-branch block: Secondary | ICD-10-CM | POA: Insufficient documentation

## 2014-01-20 NOTE — Progress Notes (Signed)
Echocardiogram performed.  

## 2014-01-22 ENCOUNTER — Ambulatory Visit: Payer: BC Managed Care – PPO

## 2014-01-23 ENCOUNTER — Telehealth: Payer: Self-pay | Admitting: Internal Medicine

## 2014-01-23 NOTE — Telephone Encounter (Signed)
Called spoke with pt. appt scheduled. Nothing further needed 

## 2014-01-26 ENCOUNTER — Ambulatory Visit (INDEPENDENT_AMBULATORY_CARE_PROVIDER_SITE_OTHER): Payer: BC Managed Care – PPO | Admitting: Interventional Cardiology

## 2014-01-26 ENCOUNTER — Encounter: Payer: Self-pay | Admitting: Interventional Cardiology

## 2014-01-26 ENCOUNTER — Ambulatory Visit (INDEPENDENT_AMBULATORY_CARE_PROVIDER_SITE_OTHER): Payer: BC Managed Care – PPO | Admitting: Internal Medicine

## 2014-01-26 ENCOUNTER — Encounter: Payer: Self-pay | Admitting: Internal Medicine

## 2014-01-26 ENCOUNTER — Ambulatory Visit (INDEPENDENT_AMBULATORY_CARE_PROVIDER_SITE_OTHER)
Admission: RE | Admit: 2014-01-26 | Discharge: 2014-01-26 | Disposition: A | Discharge status: Home / Self Care | Payer: BC Managed Care – PPO | Source: Ambulatory Visit | Attending: Internal Medicine | Admitting: Internal Medicine

## 2014-01-26 VITALS — BP 114/60 | HR 82 | Ht 60.0 in | Wt 101.2 lb

## 2014-01-26 VITALS — BP 130/82 | HR 80 | Ht 59.0 in | Wt 100.0 lb

## 2014-01-26 DIAGNOSIS — R072 Precordial pain: Secondary | ICD-10-CM

## 2014-01-26 DIAGNOSIS — R002 Palpitations: Secondary | ICD-10-CM | POA: Insufficient documentation

## 2014-01-26 DIAGNOSIS — J449 Chronic obstructive pulmonary disease, unspecified: Secondary | ICD-10-CM

## 2014-01-26 DIAGNOSIS — Z8679 Personal history of other diseases of the circulatory system: Secondary | ICD-10-CM

## 2014-01-26 DIAGNOSIS — H9190 Unspecified hearing loss, unspecified ear: Secondary | ICD-10-CM

## 2014-01-26 DIAGNOSIS — R0789 Other chest pain: Secondary | ICD-10-CM

## 2014-01-26 DIAGNOSIS — I059 Rheumatic mitral valve disease, unspecified: Secondary | ICD-10-CM

## 2014-01-26 DIAGNOSIS — I340 Nonrheumatic mitral (valve) insufficiency: Secondary | ICD-10-CM

## 2014-01-26 DIAGNOSIS — J301 Allergic rhinitis due to pollen: Secondary | ICD-10-CM

## 2014-01-26 NOTE — Assessment & Plan Note (Signed)
Need update PFT Plan- since she is moving, will leave this to be done by her new physicians

## 2014-01-26 NOTE — Progress Notes (Signed)
Quick Note:  Spoke with pt, she is aware of results. Nothing further needed. ______ 

## 2014-01-26 NOTE — Patient Instructions (Signed)
Your physician recommends that you continue on your current medications as directed. Please refer to the Current Medication list given to you today.   Your physician recommends that you schedule a follow-up appointment as needed  

## 2014-01-26 NOTE — Assessment & Plan Note (Signed)
Per ENT/ Dr Thornell Mule

## 2014-01-26 NOTE — Assessment & Plan Note (Signed)
Off allergy vaccine as planned. She will establish as needed with new physician after move to [facility]. Dymista works well. May need to get separate components for insurance.

## 2014-01-26 NOTE — Assessment & Plan Note (Signed)
Plan- CXR Recommend update PFT with new physicians after she moves.

## 2014-01-26 NOTE — Progress Notes (Signed)
Subjective:    Patient ID: Haley Wade, female    DOB: March 30, 1946, 68 y.o.   MRN: [id number]  HPI 03/09/11- 46 yo former smoker followed for allergic rhinitis, hx chronic rhinosinusitis Last couple of days she has felt some left sided sinus and ear pressure. Hearing ok. Blows nose and sneezes just on morning waking with a little cough. Not much different outside. Difficult to get here from work for allergy vaccine. We discussed protocols and allergy lab hours again. Marland Kitchen Has not needed zyrtec or flonase.   12/28/11- 70 yo former smoker followed for allergic rhinitis, hx chronic rhinosinusitis, ? COPD self referral; Already seen for Allergies and on vaccine New problem: This summer she had palpitations and had cardiology evaluation by Dr. Tamala Julian for mitral valve prolapse with regurgitation. During evaluation, chest x-ray showed hyperinflation suggesting COPD. She had smoked very little many years ago. Rule out a smoking family. No history of asthma. She has been very athletic as a runner and bike rider with no concern about exertional dyspnea.  12/26/13- 40 yo former smoker followed for allergic rhinitis, hx chronic rhinosinusitis, ? COPD, complicated by mitral valve disase FOLLOWS FOR:  Sinus pressure and pnd x1 month.  Discuss referral to [facility] for allergy injections due to pt moving to [place].Had bad cold in March- Zpak per ENT. Still much post nasal drip-clear.  Discussed transition of care. She has been on allergy vaccine here since 2010 for grass, oak, dust mite, dog, mold mix. No reactions.  No PFT since EMR change in 2012. Exercising regularly w/o DOE, cough or wheeze.   01/26/14- 52 yo former smoker followed for allergic rhinitis, hx chronic rhinosinusitis, ? COPD self referral; Already seen for Allergies and on vaccine FOLLOWS FOR: Having burning sensation in chest and moves up; had drainage as well-went to PCP and was told that her lungs sound great/clear. Moving on Friday. PFT  scheduled a year ago but never got it done. Really likes Dymista. No cough or wheeze, blowing or sneezing. Has been off allergy vaccine as planned. Main concern is substernal burning spreading from xiphoid up mid, on Prevacid, x 1-2 days. Her PCP did labs, noted clear chest. F/u sched today w/ cardiology.   ROS-see HPI Constitutional:   No-   weight loss, night sweats, fevers, chills, fatigue, lassitude. HEENT:   No-  headaches, difficulty swallowing, tooth/dental problems, sore throat,       + sneezing, itching, ear ache, nasal congestion, post nasal drip,  CV:  +chest pain, orthopnea, PND, swelling in lower extremities, anasarca,  dizziness, palpitations Resp: No-   shortness of breath with exertion or at rest.              No-   productive cough,  No non-productive cough,  No- coughing up of blood.              No-   change in color of mucus.  No- wheezing.   Skin: No-   rash or lesions. GI:  +heartburn, indigestion, No-abdominal pain, nausea, vomiting,  GU:  MS:  No-   joint pain or swelling.   Neuro-     nothing unusual Psych:  No- change in mood or affect. No depression or anxiety.  No memory loss.   Objective:   Physical Exam General- Alert, Oriented, Affect-appropriate, Distress- none acute   slender Skin- rash-none, lesions- none, excoriation- none Lymphadenopathy- none Head- atraumatic            Eyes- Gross vision intact,  PERRLA, conjunctivae clear secretions            Ears- Hearing, canals  Good light reflex,no retraction or erythema     + right hearing aid            Nose- Clear, no- Septal dev, mucus, polyps, erosion, perforation             Throat- Mallampati II , mucosa clear , drainage- none, tonsils- atrophic Neck- flexible , trachea midline, no stridor , thyroid nl, carotid no bruit, external jugular veins full Chest - symmetrical excursion , unlabored           Heart/CV- RRR , no murmur , no gallop  , no rub, nl s1 s2                           - JVD- none , edema-  none, stasis changes- none, varices- none           Lung- clear to P&A, wheeze- none, cough- none , dullness-none, rub- none           Chest wall-  Abd-  Br/ Gen/ Rectal- Not done, not indicated Extrem- cyanosis- none, clubbing, none, atrophy- none, strength- nl Neuro- grossly intact to observation   Assessment & Plan:

## 2014-01-26 NOTE — Patient Instructions (Addendum)
Order- CXR dx substernal pain  Hope your move goes well- please call as needed

## 2014-01-26 NOTE — Progress Notes (Signed)
Patient ID: Haley Wade, female   DOB: Oct 31, 1945, 68 y.o.   MRN: [id number]   Date: 01/26/2014 ID: Haley Wade, DOB [birth year], MRN [id number] PCP: Precious Reel, MD  Reason: Palpitations and epigastric discomfort  ASSESSMENT;  1.? Mitral valve prolapse with mild mitral regurgitation on clinical exam as documented by recent echo 2. Nocturnal burning epigastric to substernal discomfort with a feeling of reflux occurring, improving on Prilosec 3. History of occasional palpitations 4. Incomplete right bundle branch block. Today's tracing is unchanged from the last tracing at [facility] in 2012. 5. History of pericarditis  PLAN:  1. Reassurance and clinical observation   SUBJECTIVE: Haley Wade is a 68 y.o. female who is recently retired and now has except for the job at his part-time at [employer]. She will be moving to [place]. She has had occasional tightness in the right arm occurring spontaneously. This occurred in April. There was no other associated symptoms. She mentioned this to her primary physician and an echocardiogram was done in response. The echo is essentially unremarkable with the exception of mild mitral regurgitation.  In speaking with her she now also complains of recurring nocturnal burning and epigastric discomfort. It awakens her from sleep. This discomfort is nonexertional related. There no associated palpitations or other complaints.  She is physically active. She can walk up to 8 miles during her typical exercise routine. There is no associated cardiopulmonary complaints during these times. She denies lower extremity swelling. She has not had palpitations or syncope.  Prior cardiac workup in 2012 demonstrated a nonischemic exercise treadmill test, mild mitral regurgitation on echocardiography, and incomplete right bundle on EKG.  Allergies  Allergen Reactions  . Bee Venom Swelling    Current Outpatient Prescriptions on File Prior  to Visit  Medication Sig Dispense Refill  . azelastine (ASTELIN) 0.1 % nasal spray 1-2 sprays in each nostril at bedtime  30 mL  12  . Carboxymethylcell-Hypromellose (GENTEAL) 0.25-0.3 % GEL Apply 1 application to eye daily as needed (for dry eyes).      . conjugated estrogens (PREMARIN) vaginal cream Place 0.5 g vaginally 2 (two) times a week. Patient states that uses this just whenever she remembers during the week no specific days to list.      . estrogens, conjugated, (PREMARIN) 0.625 MG tablet Take 0.625 mg by mouth daily. Take daily for 21 days then do not take for 7 days.      . fluticasone (FLONASE) 50 MCG/ACT nasal spray Place 2 sprays into both nostrils daily as needed for allergies or rhinitis.      Marland Kitchen LORazepam (ATIVAN) 0.5 MG tablet Take 0.5-1 tablets by mouth every 8 (eight) hours as needed. Take 0.5 tablet twice a day Take 1 tablet at bedtime      . Methen-Bella-Meth Bl-Phen Sal (URISED PO) Take 1 tablet by mouth daily as needed (for bladder spasms).      Marland Kitchen spironolactone (ALDACTONE) 50 MG tablet Take 1 tablet (50 mg total) by mouth daily.      . tamsulosin (FLOMAX) 0.4 MG CAPS Take 0.4 mg by mouth at bedtime.       Marland Kitchen ZOVIRAX 200 MG capsule Take 1 capsule by mouth daily.       No current facility-administered medications on file prior to visit.    Past Medical History  Diagnosis Date  . Allergic rhinitis, cause unspecified   . Other chronic sinusitis     Past Surgical History  Procedure Laterality Date  .  Rhinoplasty    . Total abdominal hysterectomy    . Tonsillectomy    . Breast enhancement surgery      revisions  . Bladder surgery    . Laparoscopy N/A 11/01/2012    Procedure: LAPAROSCOPY DIAGNOSTIC;  Surgeon: Earnstine Regal, MD;  Location: [facility];  Service: General;  Laterality: N/A;  . Laparotomy N/A 11/01/2012    Procedure: EXPLORATORY LAPAROTOMY;  Surgeon: Earnstine Regal, MD;  Location: [facility];  Service: General;  Laterality: N/A;  . Lysis of adhesion N/A 11/01/2012      Procedure: LYSIS OF ADHESION;  Surgeon: Earnstine Regal, MD;  Location: [facility];  Service: General;  Laterality: N/A;  . Bowel resection N/A 11/01/2012    Procedure: SMALL BOWEL RESECTION;  Surgeon: Earnstine Regal, MD;  Location: [facility];  Service: General;  Laterality: N/A;    History   Social History  . Marital Status: Divorced    Spouse Name: N/A    Number of Children: 1  . Years of Education: N/A   Occupational History  . [employer] facillities coordinator    Social History Main Topics  . Smoking status: Former Smoker    Types: Cigarettes    Quit date: 08/14/1980  . Smokeless tobacco: Never Used  . Alcohol Use: No  . Drug Use: No  . Sexual Activity: Not on file   Other Topics Concern  . Not on file   Social History Narrative  . No narrative on file    Family History  Problem Relation Age of Onset  . Other Mother     c difficile gastroenteritis  . Lung cancer Father     smoker  . Liver cancer Brother   . Asthma      grandmother  . Other      bronchitis-grandmother  . Liver disease Brother     ROS: Anxiety.. Other systems negative for complaints.  OBJECTIVE: BP 130/82  Pulse 80  Ht 4\' 11"  (1.499 m)  Wt 100 lb (45.36 kg)  BMI 20.19 kg/m2,  General: No acute distress, slender and appears she hasn't been stated age 65: normal  Neck: JVD flat. Carotids no bruits Chest: Clear Cardiac: Murmur: 1-2 of 6 apical systolic murmur.. Gallop: Absent. Rhythm: Regular. Other: Soft late systolic click Abdomen: Bruit: Absent. Pulsation: Absent Extremities: Edema: Absent. Pulses: 2+ Neuro: Normal Psych: Normal  ECG: Normal sinus rhythm with incomplete right bundle unchanged from prior tracings

## 2014-01-26 NOTE — Assessment & Plan Note (Signed)
Recommend running out and stopping allergy vaccine. This can be reassessed as needed, when she settles in [place]. Reminded of general environmental dust/ mold precautions for new home. Plan- sample Dymista, establish with new physician in new home.

## 2014-01-26 NOTE — Assessment & Plan Note (Signed)
Substernal burning sounds very much like GERD despite being on PPI. She has appointment today with GI.

## 2014-02-20 DIAGNOSIS — K2100 Gastro-esophageal reflux disease with esophagitis, without bleeding: Secondary | ICD-10-CM | POA: Diagnosis not present

## 2014-02-20 DIAGNOSIS — R109 Unspecified abdominal pain: Secondary | ICD-10-CM | POA: Diagnosis not present

## 2014-03-11 ENCOUNTER — Encounter: Payer: Self-pay | Admitting: Interventional Cardiology

## 2014-03-11 ENCOUNTER — Encounter: Payer: Self-pay | Admitting: *Deleted

## 2014-03-11 DIAGNOSIS — R300 Dysuria: Secondary | ICD-10-CM | POA: Diagnosis not present

## 2014-03-11 DIAGNOSIS — N8111 Cystocele, midline: Secondary | ICD-10-CM | POA: Diagnosis not present

## 2014-03-13 ENCOUNTER — Encounter: Payer: Self-pay | Admitting: Internal Medicine

## 2014-05-11 DIAGNOSIS — J3089 Other allergic rhinitis: Secondary | ICD-10-CM | POA: Diagnosis not present

## 2014-05-11 DIAGNOSIS — H903 Sensorineural hearing loss, bilateral: Secondary | ICD-10-CM | POA: Diagnosis not present

## 2014-06-09 DIAGNOSIS — F411 Generalized anxiety disorder: Secondary | ICD-10-CM | POA: Diagnosis not present

## 2014-06-09 DIAGNOSIS — F609 Personality disorder, unspecified: Secondary | ICD-10-CM | POA: Diagnosis not present

## 2014-06-09 DIAGNOSIS — F410 Panic disorder [episodic paroxysmal anxiety] without agoraphobia: Secondary | ICD-10-CM | POA: Diagnosis not present

## 2014-06-18 DIAGNOSIS — J111 Influenza due to unidentified influenza virus with other respiratory manifestations: Secondary | ICD-10-CM | POA: Diagnosis not present

## 2014-06-19 ENCOUNTER — Encounter: Payer: Self-pay | Admitting: Internal Medicine

## 2014-06-24 DIAGNOSIS — Z23 Encounter for immunization: Secondary | ICD-10-CM | POA: Diagnosis not present

## 2014-07-24 DIAGNOSIS — M5136 Other intervertebral disc degeneration, lumbar region: Secondary | ICD-10-CM | POA: Diagnosis not present

## 2014-07-24 DIAGNOSIS — M47812 Spondylosis without myelopathy or radiculopathy, cervical region: Secondary | ICD-10-CM | POA: Diagnosis not present

## 2014-09-01 DIAGNOSIS — Z01419 Encounter for gynecological examination (general) (routine) without abnormal findings: Secondary | ICD-10-CM | POA: Diagnosis not present

## 2014-09-01 DIAGNOSIS — Z124 Encounter for screening for malignant neoplasm of cervix: Secondary | ICD-10-CM | POA: Diagnosis not present

## 2014-09-16 DIAGNOSIS — M25512 Pain in left shoulder: Secondary | ICD-10-CM | POA: Diagnosis not present

## 2014-09-16 DIAGNOSIS — M25511 Pain in right shoulder: Secondary | ICD-10-CM | POA: Diagnosis not present

## 2014-09-18 DIAGNOSIS — Z1389 Encounter for screening for other disorder: Secondary | ICD-10-CM | POA: Diagnosis not present

## 2014-09-18 DIAGNOSIS — H8109 Meniere's disease, unspecified ear: Secondary | ICD-10-CM | POA: Diagnosis not present

## 2014-09-18 DIAGNOSIS — Z0000 Encounter for general adult medical examination without abnormal findings: Secondary | ICD-10-CM | POA: Diagnosis not present

## 2014-09-18 DIAGNOSIS — J309 Allergic rhinitis, unspecified: Secondary | ICD-10-CM | POA: Diagnosis not present

## 2014-09-18 DIAGNOSIS — I4510 Unspecified right bundle-branch block: Secondary | ICD-10-CM | POA: Diagnosis not present

## 2014-09-18 DIAGNOSIS — F419 Anxiety disorder, unspecified: Secondary | ICD-10-CM | POA: Diagnosis not present

## 2014-09-18 DIAGNOSIS — D649 Anemia, unspecified: Secondary | ICD-10-CM | POA: Diagnosis not present

## 2014-09-18 DIAGNOSIS — K5900 Constipation, unspecified: Secondary | ICD-10-CM | POA: Diagnosis not present

## 2014-09-18 DIAGNOSIS — Z008 Encounter for other general examination: Secondary | ICD-10-CM | POA: Diagnosis not present

## 2014-09-18 DIAGNOSIS — Z681 Body mass index (BMI) 19 or less, adult: Secondary | ICD-10-CM | POA: Diagnosis not present

## 2014-09-18 DIAGNOSIS — I340 Nonrheumatic mitral (valve) insufficiency: Secondary | ICD-10-CM | POA: Diagnosis not present

## 2014-09-18 DIAGNOSIS — K219 Gastro-esophageal reflux disease without esophagitis: Secondary | ICD-10-CM | POA: Diagnosis not present

## 2014-09-30 DIAGNOSIS — M25512 Pain in left shoulder: Secondary | ICD-10-CM | POA: Diagnosis not present

## 2014-10-01 DIAGNOSIS — Z1212 Encounter for screening for malignant neoplasm of rectum: Secondary | ICD-10-CM | POA: Diagnosis not present

## 2014-10-07 DIAGNOSIS — M25512 Pain in left shoulder: Secondary | ICD-10-CM | POA: Diagnosis not present

## 2014-10-10 DIAGNOSIS — S0181XA Laceration without foreign body of other part of head, initial encounter: Secondary | ICD-10-CM | POA: Diagnosis not present

## 2014-10-10 DIAGNOSIS — S6991XA Unspecified injury of right wrist, hand and finger(s), initial encounter: Secondary | ICD-10-CM | POA: Diagnosis not present

## 2014-10-10 DIAGNOSIS — S0993XA Unspecified injury of face, initial encounter: Secondary | ICD-10-CM | POA: Diagnosis not present

## 2014-10-16 DIAGNOSIS — H9201 Otalgia, right ear: Secondary | ICD-10-CM | POA: Diagnosis not present

## 2014-10-16 DIAGNOSIS — S0181XD Laceration without foreign body of other part of head, subsequent encounter: Secondary | ICD-10-CM | POA: Diagnosis not present

## 2014-10-23 DIAGNOSIS — N390 Urinary tract infection, site not specified: Secondary | ICD-10-CM | POA: Diagnosis not present

## 2014-10-23 DIAGNOSIS — H6990 Unspecified Eustachian tube disorder, unspecified ear: Secondary | ICD-10-CM | POA: Diagnosis not present

## 2014-10-23 DIAGNOSIS — H9190 Unspecified hearing loss, unspecified ear: Secondary | ICD-10-CM | POA: Diagnosis not present

## 2014-10-23 DIAGNOSIS — Z1231 Encounter for screening mammogram for malignant neoplasm of breast: Secondary | ICD-10-CM | POA: Diagnosis not present

## 2014-10-23 DIAGNOSIS — J309 Allergic rhinitis, unspecified: Secondary | ICD-10-CM | POA: Diagnosis not present

## 2014-10-23 DIAGNOSIS — Z23 Encounter for immunization: Secondary | ICD-10-CM | POA: Diagnosis not present

## 2014-10-23 DIAGNOSIS — D649 Anemia, unspecified: Secondary | ICD-10-CM | POA: Diagnosis not present

## 2014-10-26 DIAGNOSIS — D649 Anemia, unspecified: Secondary | ICD-10-CM | POA: Diagnosis not present

## 2014-11-02 DIAGNOSIS — H25813 Combined forms of age-related cataract, bilateral: Secondary | ICD-10-CM | POA: Diagnosis not present

## 2014-11-02 DIAGNOSIS — H04123 Dry eye syndrome of bilateral lacrimal glands: Secondary | ICD-10-CM | POA: Diagnosis not present

## 2014-11-02 DIAGNOSIS — H538 Other visual disturbances: Secondary | ICD-10-CM | POA: Diagnosis not present

## 2014-11-09 DIAGNOSIS — J3089 Other allergic rhinitis: Secondary | ICD-10-CM | POA: Diagnosis not present

## 2014-11-09 DIAGNOSIS — H60391 Other infective otitis externa, right ear: Secondary | ICD-10-CM | POA: Diagnosis not present

## 2014-11-16 DIAGNOSIS — A09 Infectious gastroenteritis and colitis, unspecified: Secondary | ICD-10-CM | POA: Diagnosis not present

## 2014-11-16 DIAGNOSIS — K219 Gastro-esophageal reflux disease without esophagitis: Secondary | ICD-10-CM | POA: Diagnosis not present

## 2014-11-19 DIAGNOSIS — F609 Personality disorder, unspecified: Secondary | ICD-10-CM | POA: Diagnosis not present

## 2014-11-19 DIAGNOSIS — F410 Panic disorder [episodic paroxysmal anxiety] without agoraphobia: Secondary | ICD-10-CM | POA: Diagnosis not present

## 2014-11-19 DIAGNOSIS — F411 Generalized anxiety disorder: Secondary | ICD-10-CM | POA: Diagnosis not present

## 2014-11-20 DIAGNOSIS — K219 Gastro-esophageal reflux disease without esophagitis: Secondary | ICD-10-CM | POA: Diagnosis not present

## 2014-11-20 DIAGNOSIS — F411 Generalized anxiety disorder: Secondary | ICD-10-CM | POA: Diagnosis not present

## 2014-11-20 DIAGNOSIS — R5383 Other fatigue: Secondary | ICD-10-CM | POA: Diagnosis not present

## 2014-11-20 DIAGNOSIS — K529 Noninfective gastroenteritis and colitis, unspecified: Secondary | ICD-10-CM | POA: Diagnosis not present

## 2014-11-20 DIAGNOSIS — L659 Nonscarring hair loss, unspecified: Secondary | ICD-10-CM | POA: Diagnosis not present

## 2014-11-20 DIAGNOSIS — B009 Herpesviral infection, unspecified: Secondary | ICD-10-CM | POA: Diagnosis not present

## 2014-11-24 DIAGNOSIS — M19049 Primary osteoarthritis, unspecified hand: Secondary | ICD-10-CM | POA: Diagnosis not present

## 2014-12-07 DIAGNOSIS — D649 Anemia, unspecified: Secondary | ICD-10-CM | POA: Diagnosis not present

## 2014-12-15 DIAGNOSIS — M47816 Spondylosis without myelopathy or radiculopathy, lumbar region: Secondary | ICD-10-CM | POA: Diagnosis not present

## 2014-12-15 DIAGNOSIS — M5136 Other intervertebral disc degeneration, lumbar region: Secondary | ICD-10-CM | POA: Diagnosis not present

## 2014-12-15 DIAGNOSIS — M545 Low back pain: Secondary | ICD-10-CM | POA: Diagnosis not present

## 2014-12-28 DIAGNOSIS — M47816 Spondylosis without myelopathy or radiculopathy, lumbar region: Secondary | ICD-10-CM | POA: Diagnosis not present

## 2014-12-28 DIAGNOSIS — M5136 Other intervertebral disc degeneration, lumbar region: Secondary | ICD-10-CM | POA: Diagnosis not present

## 2015-01-01 DIAGNOSIS — D649 Anemia, unspecified: Secondary | ICD-10-CM | POA: Diagnosis not present

## 2015-01-04 ENCOUNTER — Other Ambulatory Visit: Payer: Self-pay | Admitting: Gastroenterology

## 2015-01-04 DIAGNOSIS — R5383 Other fatigue: Secondary | ICD-10-CM

## 2015-01-04 DIAGNOSIS — R1084 Generalized abdominal pain: Secondary | ICD-10-CM

## 2015-01-04 DIAGNOSIS — R110 Nausea: Secondary | ICD-10-CM

## 2015-01-08 ENCOUNTER — Ambulatory Visit
Admission: RE | Admit: 2015-01-08 | Discharge: 2015-01-08 | Disposition: A | Discharge status: Home / Self Care | Payer: BC Managed Care – PPO | Source: Ambulatory Visit | Attending: Gastroenterology | Admitting: Gastroenterology

## 2015-01-08 ENCOUNTER — Other Ambulatory Visit (HOSPITAL_COMMUNITY): Payer: Self-pay | Admitting: *Deleted

## 2015-01-08 DIAGNOSIS — R1084 Generalized abdominal pain: Secondary | ICD-10-CM

## 2015-01-08 DIAGNOSIS — N8110 Cystocele, unspecified: Secondary | ICD-10-CM | POA: Diagnosis not present

## 2015-01-08 DIAGNOSIS — R5383 Other fatigue: Secondary | ICD-10-CM

## 2015-01-08 DIAGNOSIS — R110 Nausea: Secondary | ICD-10-CM

## 2015-01-08 DIAGNOSIS — R109 Unspecified abdominal pain: Secondary | ICD-10-CM | POA: Diagnosis not present

## 2015-01-08 MED ORDER — IOHEXOL 300 MG/ML  SOLN
100.0000 mL | Freq: Once | INTRAMUSCULAR | Status: AC | PRN
  Administered 2015-01-08: 100 mL via INTRAVENOUS

## 2015-01-08 MED ORDER — IOHEXOL 300 MG/ML  SOLN: 100.0000 mL | Freq: Once | INTRAMUSCULAR | Status: DC | PRN

## 2015-01-12 ENCOUNTER — Encounter (HOSPITAL_COMMUNITY)
Admission: RE | Admit: 2015-01-12 | Discharge: 2015-01-12 | Disposition: A | Discharge status: Home / Self Care | Payer: Medicare Other | Source: Ambulatory Visit | Attending: Internal Medicine | Admitting: Internal Medicine

## 2015-01-12 DIAGNOSIS — D649 Anemia, unspecified: Secondary | ICD-10-CM | POA: Diagnosis not present

## 2015-01-12 MED ORDER — SODIUM CHLORIDE 0.9 % IV SOLN
510.0000 mg | INTRAVENOUS | Status: DC
  Administered 2015-01-12: 510 mg via INTRAVENOUS
  Filled 2015-01-12: qty 17

## 2015-01-15 ENCOUNTER — Encounter (HOSPITAL_COMMUNITY): Payer: Medicare Other

## 2015-01-20 ENCOUNTER — Other Ambulatory Visit (HOSPITAL_COMMUNITY): Payer: Self-pay | Admitting: *Deleted

## 2015-01-21 ENCOUNTER — Encounter (HOSPITAL_COMMUNITY)
Admission: RE | Admit: 2015-01-21 | Discharge: 2015-01-21 | Disposition: A | Discharge status: Home / Self Care | Payer: Medicare Other | Source: Ambulatory Visit | Attending: Internal Medicine | Admitting: Internal Medicine

## 2015-01-21 DIAGNOSIS — D649 Anemia, unspecified: Secondary | ICD-10-CM | POA: Insufficient documentation

## 2015-01-21 DIAGNOSIS — A09 Infectious gastroenteritis and colitis, unspecified: Secondary | ICD-10-CM | POA: Diagnosis not present

## 2015-01-21 DIAGNOSIS — K219 Gastro-esophageal reflux disease without esophagitis: Secondary | ICD-10-CM | POA: Diagnosis not present

## 2015-01-21 DIAGNOSIS — D500 Iron deficiency anemia secondary to blood loss (chronic): Secondary | ICD-10-CM | POA: Diagnosis not present

## 2015-01-21 MED ORDER — SODIUM CHLORIDE 0.9 % IV SOLN
510.0000 mg | INTRAVENOUS | Status: AC
  Administered 2015-01-21: 510 mg via INTRAVENOUS
  Filled 2015-01-21: qty 17

## 2015-01-21 NOTE — Progress Notes (Addendum)
Pt c/o SOB, ankle swelling since last feraheme on 01/12/2015.  Lungs are clear, no pitting edema.  Dr Brigitte Pulse notified no new orders "ok to give feraheme infusion today, we will followup with her in the office.  Will continue to monitor

## 2015-01-22 ENCOUNTER — Encounter (HOSPITAL_COMMUNITY): Payer: Medicare Other

## 2015-02-02 DIAGNOSIS — M47816 Spondylosis without myelopathy or radiculopathy, lumbar region: Secondary | ICD-10-CM | POA: Diagnosis not present

## 2015-02-02 DIAGNOSIS — M5136 Other intervertebral disc degeneration, lumbar region: Secondary | ICD-10-CM | POA: Diagnosis not present

## 2015-02-08 ENCOUNTER — Other Ambulatory Visit: Payer: Self-pay | Admitting: Gastroenterology

## 2015-02-08 DIAGNOSIS — K449 Diaphragmatic hernia without obstruction or gangrene: Secondary | ICD-10-CM | POA: Diagnosis not present

## 2015-02-08 DIAGNOSIS — D122 Benign neoplasm of ascending colon: Secondary | ICD-10-CM | POA: Diagnosis not present

## 2015-02-08 DIAGNOSIS — R1013 Epigastric pain: Secondary | ICD-10-CM | POA: Diagnosis not present

## 2015-02-08 DIAGNOSIS — K228 Other specified diseases of esophagus: Secondary | ICD-10-CM | POA: Diagnosis not present

## 2015-02-08 DIAGNOSIS — D175 Benign lipomatous neoplasm of intra-abdominal organs: Secondary | ICD-10-CM | POA: Diagnosis not present

## 2015-02-08 DIAGNOSIS — D509 Iron deficiency anemia, unspecified: Secondary | ICD-10-CM | POA: Diagnosis not present

## 2015-02-08 DIAGNOSIS — K5730 Diverticulosis of large intestine without perforation or abscess without bleeding: Secondary | ICD-10-CM | POA: Diagnosis not present

## 2015-02-08 DIAGNOSIS — K31811 Angiodysplasia of stomach and duodenum with bleeding: Secondary | ICD-10-CM | POA: Diagnosis not present

## 2015-02-08 DIAGNOSIS — Z86010 Personal history of colonic polyps: Secondary | ICD-10-CM | POA: Diagnosis not present

## 2015-02-18 DIAGNOSIS — Z7189 Other specified counseling: Secondary | ICD-10-CM | POA: Diagnosis not present

## 2015-02-18 DIAGNOSIS — D485 Neoplasm of uncertain behavior of skin: Secondary | ICD-10-CM | POA: Diagnosis not present

## 2015-02-18 DIAGNOSIS — D2262 Melanocytic nevi of left upper limb, including shoulder: Secondary | ICD-10-CM | POA: Diagnosis not present

## 2015-02-18 DIAGNOSIS — D2271 Melanocytic nevi of right lower limb, including hip: Secondary | ICD-10-CM | POA: Diagnosis not present

## 2015-02-18 DIAGNOSIS — D2272 Melanocytic nevi of left lower limb, including hip: Secondary | ICD-10-CM | POA: Diagnosis not present

## 2015-02-18 DIAGNOSIS — L821 Other seborrheic keratosis: Secondary | ICD-10-CM | POA: Diagnosis not present

## 2015-02-18 DIAGNOSIS — L814 Other melanin hyperpigmentation: Secondary | ICD-10-CM | POA: Diagnosis not present

## 2015-02-18 DIAGNOSIS — L710 Perioral dermatitis: Secondary | ICD-10-CM | POA: Diagnosis not present

## 2015-03-05 DIAGNOSIS — M170 Bilateral primary osteoarthritis of knee: Secondary | ICD-10-CM | POA: Diagnosis not present

## 2015-03-05 DIAGNOSIS — M19049 Primary osteoarthritis, unspecified hand: Secondary | ICD-10-CM | POA: Diagnosis not present

## 2015-03-05 DIAGNOSIS — K31811 Angiodysplasia of stomach and duodenum with bleeding: Secondary | ICD-10-CM | POA: Diagnosis not present

## 2015-03-09 DIAGNOSIS — N8111 Cystocele, midline: Secondary | ICD-10-CM | POA: Diagnosis not present

## 2015-03-26 DIAGNOSIS — D509 Iron deficiency anemia, unspecified: Secondary | ICD-10-CM | POA: Diagnosis not present

## 2015-03-31 DIAGNOSIS — H6011 Cellulitis of right external ear: Secondary | ICD-10-CM | POA: Diagnosis not present

## 2015-03-31 DIAGNOSIS — M25562 Pain in left knee: Secondary | ICD-10-CM | POA: Diagnosis not present

## 2015-03-31 DIAGNOSIS — H903 Sensorineural hearing loss, bilateral: Secondary | ICD-10-CM | POA: Diagnosis not present

## 2015-03-31 DIAGNOSIS — J3089 Other allergic rhinitis: Secondary | ICD-10-CM | POA: Diagnosis not present

## 2015-03-31 DIAGNOSIS — M25561 Pain in right knee: Secondary | ICD-10-CM | POA: Diagnosis not present

## 2015-03-31 DIAGNOSIS — M25511 Pain in right shoulder: Secondary | ICD-10-CM | POA: Diagnosis not present

## 2015-04-07 DIAGNOSIS — N8110 Cystocele, unspecified: Secondary | ICD-10-CM | POA: Diagnosis not present

## 2015-04-07 DIAGNOSIS — N9489 Other specified conditions associated with female genital organs and menstrual cycle: Secondary | ICD-10-CM | POA: Diagnosis not present

## 2015-04-12 DIAGNOSIS — D500 Iron deficiency anemia secondary to blood loss (chronic): Secondary | ICD-10-CM | POA: Diagnosis not present

## 2015-04-12 DIAGNOSIS — K219 Gastro-esophageal reflux disease without esophagitis: Secondary | ICD-10-CM | POA: Diagnosis not present

## 2015-04-20 DIAGNOSIS — J309 Allergic rhinitis, unspecified: Secondary | ICD-10-CM | POA: Diagnosis not present

## 2015-04-20 DIAGNOSIS — R05 Cough: Secondary | ICD-10-CM | POA: Diagnosis not present

## 2015-05-11 DIAGNOSIS — H6011 Cellulitis of right external ear: Secondary | ICD-10-CM | POA: Diagnosis not present

## 2015-05-11 DIAGNOSIS — J3089 Other allergic rhinitis: Secondary | ICD-10-CM | POA: Diagnosis not present

## 2015-05-18 DIAGNOSIS — K31811 Angiodysplasia of stomach and duodenum with bleeding: Secondary | ICD-10-CM | POA: Diagnosis not present

## 2015-05-18 DIAGNOSIS — M170 Bilateral primary osteoarthritis of knee: Secondary | ICD-10-CM | POA: Diagnosis not present

## 2015-05-18 DIAGNOSIS — M19049 Primary osteoarthritis, unspecified hand: Secondary | ICD-10-CM | POA: Diagnosis not present

## 2015-06-14 DIAGNOSIS — L710 Perioral dermatitis: Secondary | ICD-10-CM | POA: Diagnosis not present

## 2015-06-22 DIAGNOSIS — Z23 Encounter for immunization: Secondary | ICD-10-CM | POA: Diagnosis not present

## 2015-06-23 DIAGNOSIS — H903 Sensorineural hearing loss, bilateral: Secondary | ICD-10-CM | POA: Diagnosis not present

## 2015-06-23 DIAGNOSIS — H608X1 Other otitis externa, right ear: Secondary | ICD-10-CM | POA: Diagnosis not present

## 2015-07-06 DIAGNOSIS — D509 Iron deficiency anemia, unspecified: Secondary | ICD-10-CM | POA: Diagnosis not present

## 2015-07-06 DIAGNOSIS — M19049 Primary osteoarthritis, unspecified hand: Secondary | ICD-10-CM | POA: Diagnosis not present

## 2015-07-07 DIAGNOSIS — F410 Panic disorder [episodic paroxysmal anxiety] without agoraphobia: Secondary | ICD-10-CM | POA: Diagnosis not present

## 2015-07-07 DIAGNOSIS — F411 Generalized anxiety disorder: Secondary | ICD-10-CM | POA: Diagnosis not present

## 2015-07-16 DIAGNOSIS — L700 Acne vulgaris: Secondary | ICD-10-CM | POA: Diagnosis not present

## 2015-07-16 DIAGNOSIS — L218 Other seborrheic dermatitis: Secondary | ICD-10-CM | POA: Diagnosis not present

## 2015-07-28 DIAGNOSIS — H903 Sensorineural hearing loss, bilateral: Secondary | ICD-10-CM | POA: Diagnosis not present

## 2015-08-11 DIAGNOSIS — M25562 Pain in left knee: Secondary | ICD-10-CM | POA: Diagnosis not present

## 2015-08-11 DIAGNOSIS — M79662 Pain in left lower leg: Secondary | ICD-10-CM | POA: Diagnosis not present

## 2015-09-10 DIAGNOSIS — Z124 Encounter for screening for malignant neoplasm of cervix: Secondary | ICD-10-CM | POA: Diagnosis not present

## 2015-09-10 DIAGNOSIS — Z01419 Encounter for gynecological examination (general) (routine) without abnormal findings: Secondary | ICD-10-CM | POA: Diagnosis not present

## 2015-09-13 DIAGNOSIS — M19049 Primary osteoarthritis, unspecified hand: Secondary | ICD-10-CM | POA: Diagnosis not present

## 2015-09-24 DIAGNOSIS — K31819 Angiodysplasia of stomach and duodenum without bleeding: Secondary | ICD-10-CM | POA: Diagnosis not present

## 2015-09-24 DIAGNOSIS — F418 Other specified anxiety disorders: Secondary | ICD-10-CM | POA: Diagnosis not present

## 2015-09-24 DIAGNOSIS — I4510 Unspecified right bundle-branch block: Secondary | ICD-10-CM | POA: Diagnosis not present

## 2015-09-24 DIAGNOSIS — H9190 Unspecified hearing loss, unspecified ear: Secondary | ICD-10-CM | POA: Diagnosis not present

## 2015-09-24 DIAGNOSIS — R002 Palpitations: Secondary | ICD-10-CM | POA: Diagnosis not present

## 2015-09-24 DIAGNOSIS — Z0000 Encounter for general adult medical examination without abnormal findings: Secondary | ICD-10-CM | POA: Diagnosis not present

## 2015-09-24 DIAGNOSIS — D6489 Other specified anemias: Secondary | ICD-10-CM | POA: Diagnosis not present

## 2015-09-24 DIAGNOSIS — Z681 Body mass index (BMI) 19 or less, adult: Secondary | ICD-10-CM | POA: Diagnosis not present

## 2015-09-24 DIAGNOSIS — Z1389 Encounter for screening for other disorder: Secondary | ICD-10-CM | POA: Diagnosis not present

## 2015-09-29 DIAGNOSIS — M5136 Other intervertebral disc degeneration, lumbar region: Secondary | ICD-10-CM | POA: Diagnosis not present

## 2015-10-01 DIAGNOSIS — M47816 Spondylosis without myelopathy or radiculopathy, lumbar region: Secondary | ICD-10-CM | POA: Diagnosis not present

## 2015-10-01 DIAGNOSIS — M62838 Other muscle spasm: Secondary | ICD-10-CM | POA: Diagnosis not present

## 2015-10-01 DIAGNOSIS — M5136 Other intervertebral disc degeneration, lumbar region: Secondary | ICD-10-CM | POA: Diagnosis not present

## 2015-10-01 DIAGNOSIS — M542 Cervicalgia: Secondary | ICD-10-CM | POA: Diagnosis not present

## 2015-10-06 DIAGNOSIS — L309 Dermatitis, unspecified: Secondary | ICD-10-CM | POA: Diagnosis not present

## 2015-10-06 DIAGNOSIS — L700 Acne vulgaris: Secondary | ICD-10-CM | POA: Diagnosis not present

## 2015-10-06 DIAGNOSIS — L659 Nonscarring hair loss, unspecified: Secondary | ICD-10-CM | POA: Diagnosis not present

## 2015-10-15 DIAGNOSIS — M5136 Other intervertebral disc degeneration, lumbar region: Secondary | ICD-10-CM | POA: Diagnosis not present

## 2015-10-22 DIAGNOSIS — M5136 Other intervertebral disc degeneration, lumbar region: Secondary | ICD-10-CM | POA: Diagnosis not present

## 2015-10-28 DIAGNOSIS — J111 Influenza due to unidentified influenza virus with other respiratory manifestations: Secondary | ICD-10-CM | POA: Diagnosis not present

## 2015-11-05 DIAGNOSIS — M5136 Other intervertebral disc degeneration, lumbar region: Secondary | ICD-10-CM | POA: Diagnosis not present

## 2015-11-11 DIAGNOSIS — M19049 Primary osteoarthritis, unspecified hand: Secondary | ICD-10-CM | POA: Diagnosis not present

## 2015-11-11 DIAGNOSIS — K31811 Angiodysplasia of stomach and duodenum with bleeding: Secondary | ICD-10-CM | POA: Diagnosis not present

## 2015-11-16 DIAGNOSIS — M47816 Spondylosis without myelopathy or radiculopathy, lumbar region: Secondary | ICD-10-CM | POA: Diagnosis not present

## 2015-11-16 DIAGNOSIS — M5136 Other intervertebral disc degeneration, lumbar region: Secondary | ICD-10-CM | POA: Diagnosis not present

## 2015-11-16 DIAGNOSIS — M62838 Other muscle spasm: Secondary | ICD-10-CM | POA: Diagnosis not present

## 2015-11-16 DIAGNOSIS — M542 Cervicalgia: Secondary | ICD-10-CM | POA: Diagnosis not present

## 2015-11-18 DIAGNOSIS — M5136 Other intervertebral disc degeneration, lumbar region: Secondary | ICD-10-CM | POA: Diagnosis not present

## 2015-12-10 DIAGNOSIS — M5136 Other intervertebral disc degeneration, lumbar region: Secondary | ICD-10-CM | POA: Diagnosis not present

## 2015-12-20 DIAGNOSIS — H25812 Combined forms of age-related cataract, left eye: Secondary | ICD-10-CM | POA: Diagnosis not present

## 2015-12-20 DIAGNOSIS — H04123 Dry eye syndrome of bilateral lacrimal glands: Secondary | ICD-10-CM | POA: Diagnosis not present

## 2015-12-20 DIAGNOSIS — H25011 Cortical age-related cataract, right eye: Secondary | ICD-10-CM | POA: Diagnosis not present

## 2015-12-23 DIAGNOSIS — D500 Iron deficiency anemia secondary to blood loss (chronic): Secondary | ICD-10-CM | POA: Diagnosis not present

## 2015-12-23 DIAGNOSIS — R140 Abdominal distension (gaseous): Secondary | ICD-10-CM | POA: Diagnosis not present

## 2015-12-27 DIAGNOSIS — M5136 Other intervertebral disc degeneration, lumbar region: Secondary | ICD-10-CM | POA: Diagnosis not present

## 2016-01-03 DIAGNOSIS — M5136 Other intervertebral disc degeneration, lumbar region: Secondary | ICD-10-CM | POA: Diagnosis not present

## 2016-01-06 DIAGNOSIS — M25562 Pain in left knee: Secondary | ICD-10-CM | POA: Diagnosis not present

## 2016-01-11 DIAGNOSIS — B009 Herpesviral infection, unspecified: Secondary | ICD-10-CM | POA: Diagnosis not present

## 2016-01-11 DIAGNOSIS — L659 Nonscarring hair loss, unspecified: Secondary | ICD-10-CM | POA: Diagnosis not present

## 2016-01-25 DIAGNOSIS — M25562 Pain in left knee: Secondary | ICD-10-CM | POA: Diagnosis not present

## 2016-01-25 DIAGNOSIS — M1712 Unilateral primary osteoarthritis, left knee: Secondary | ICD-10-CM | POA: Diagnosis not present

## 2016-02-17 DIAGNOSIS — M25562 Pain in left knee: Secondary | ICD-10-CM | POA: Diagnosis not present

## 2016-02-17 DIAGNOSIS — M1712 Unilateral primary osteoarthritis, left knee: Secondary | ICD-10-CM | POA: Diagnosis not present

## 2016-02-25 DIAGNOSIS — M25562 Pain in left knee: Secondary | ICD-10-CM | POA: Diagnosis not present

## 2016-02-25 DIAGNOSIS — M1712 Unilateral primary osteoarthritis, left knee: Secondary | ICD-10-CM | POA: Diagnosis not present

## 2016-02-28 DIAGNOSIS — M899 Disorder of bone, unspecified: Secondary | ICD-10-CM | POA: Diagnosis not present

## 2016-02-29 DIAGNOSIS — D509 Iron deficiency anemia, unspecified: Secondary | ICD-10-CM | POA: Diagnosis not present

## 2016-02-29 DIAGNOSIS — M8589 Other specified disorders of bone density and structure, multiple sites: Secondary | ICD-10-CM | POA: Diagnosis not present

## 2016-02-29 DIAGNOSIS — M19049 Primary osteoarthritis, unspecified hand: Secondary | ICD-10-CM | POA: Diagnosis not present

## 2016-03-07 DIAGNOSIS — F411 Generalized anxiety disorder: Secondary | ICD-10-CM | POA: Diagnosis not present

## 2016-03-07 DIAGNOSIS — F410 Panic disorder [episodic paroxysmal anxiety] without agoraphobia: Secondary | ICD-10-CM | POA: Diagnosis not present

## 2016-04-07 DIAGNOSIS — M5030 Other cervical disc degeneration, unspecified cervical region: Secondary | ICD-10-CM | POA: Diagnosis not present

## 2016-04-07 DIAGNOSIS — M5124 Other intervertebral disc displacement, thoracic region: Secondary | ICD-10-CM | POA: Diagnosis not present

## 2016-04-07 DIAGNOSIS — M9902 Segmental and somatic dysfunction of thoracic region: Secondary | ICD-10-CM | POA: Diagnosis not present

## 2016-04-07 DIAGNOSIS — M9901 Segmental and somatic dysfunction of cervical region: Secondary | ICD-10-CM | POA: Diagnosis not present

## 2016-05-25 DIAGNOSIS — M542 Cervicalgia: Secondary | ICD-10-CM | POA: Diagnosis not present

## 2016-05-25 DIAGNOSIS — M5481 Occipital neuralgia: Secondary | ICD-10-CM | POA: Diagnosis not present

## 2016-06-13 DIAGNOSIS — L710 Perioral dermatitis: Secondary | ICD-10-CM | POA: Diagnosis not present

## 2016-06-20 DIAGNOSIS — M542 Cervicalgia: Secondary | ICD-10-CM | POA: Diagnosis not present

## 2016-06-28 DIAGNOSIS — Z23 Encounter for immunization: Secondary | ICD-10-CM | POA: Diagnosis not present

## 2016-06-28 DIAGNOSIS — D6489 Other specified anemias: Secondary | ICD-10-CM | POA: Diagnosis not present

## 2016-06-28 DIAGNOSIS — D649 Anemia, unspecified: Secondary | ICD-10-CM | POA: Diagnosis not present

## 2016-07-08 DIAGNOSIS — M542 Cervicalgia: Secondary | ICD-10-CM | POA: Diagnosis not present

## 2016-07-08 DIAGNOSIS — M47816 Spondylosis without myelopathy or radiculopathy, lumbar region: Secondary | ICD-10-CM | POA: Diagnosis not present

## 2016-07-10 DIAGNOSIS — M47812 Spondylosis without myelopathy or radiculopathy, cervical region: Secondary | ICD-10-CM | POA: Diagnosis not present

## 2016-07-10 DIAGNOSIS — M5481 Occipital neuralgia: Secondary | ICD-10-CM | POA: Diagnosis not present

## 2016-08-01 DIAGNOSIS — M47812 Spondylosis without myelopathy or radiculopathy, cervical region: Secondary | ICD-10-CM | POA: Diagnosis not present

## 2016-08-03 DIAGNOSIS — J00 Acute nasopharyngitis [common cold]: Secondary | ICD-10-CM | POA: Diagnosis not present

## 2016-08-28 DIAGNOSIS — R197 Diarrhea, unspecified: Secondary | ICD-10-CM | POA: Diagnosis not present

## 2016-08-28 DIAGNOSIS — D509 Iron deficiency anemia, unspecified: Secondary | ICD-10-CM | POA: Diagnosis not present

## 2016-09-13 DIAGNOSIS — A09 Infectious gastroenteritis and colitis, unspecified: Secondary | ICD-10-CM | POA: Diagnosis not present

## 2016-09-13 DIAGNOSIS — D500 Iron deficiency anemia secondary to blood loss (chronic): Secondary | ICD-10-CM | POA: Diagnosis not present

## 2016-09-19 DIAGNOSIS — M542 Cervicalgia: Secondary | ICD-10-CM | POA: Diagnosis not present

## 2016-09-19 DIAGNOSIS — M5481 Occipital neuralgia: Secondary | ICD-10-CM | POA: Diagnosis not present

## 2016-09-19 DIAGNOSIS — Z681 Body mass index (BMI) 19 or less, adult: Secondary | ICD-10-CM | POA: Diagnosis not present

## 2016-09-19 DIAGNOSIS — M47812 Spondylosis without myelopathy or radiculopathy, cervical region: Secondary | ICD-10-CM | POA: Diagnosis not present

## 2016-09-25 DIAGNOSIS — H8109 Meniere's disease, unspecified ear: Secondary | ICD-10-CM | POA: Diagnosis not present

## 2016-09-25 DIAGNOSIS — I340 Nonrheumatic mitral (valve) insufficiency: Secondary | ICD-10-CM | POA: Diagnosis not present

## 2016-09-25 DIAGNOSIS — Z1389 Encounter for screening for other disorder: Secondary | ICD-10-CM | POA: Diagnosis not present

## 2016-09-25 DIAGNOSIS — Z681 Body mass index (BMI) 19 or less, adult: Secondary | ICD-10-CM | POA: Diagnosis not present

## 2016-09-25 DIAGNOSIS — I4510 Unspecified right bundle-branch block: Secondary | ICD-10-CM | POA: Diagnosis not present

## 2016-09-25 DIAGNOSIS — J3089 Other allergic rhinitis: Secondary | ICD-10-CM | POA: Diagnosis not present

## 2016-09-25 DIAGNOSIS — K31819 Angiodysplasia of stomach and duodenum without bleeding: Secondary | ICD-10-CM | POA: Diagnosis not present

## 2016-09-25 DIAGNOSIS — H6990 Unspecified Eustachian tube disorder, unspecified ear: Secondary | ICD-10-CM | POA: Diagnosis not present

## 2016-09-25 DIAGNOSIS — R0789 Other chest pain: Secondary | ICD-10-CM | POA: Diagnosis not present

## 2016-09-25 DIAGNOSIS — F418 Other specified anxiety disorders: Secondary | ICD-10-CM | POA: Diagnosis not present

## 2016-09-25 DIAGNOSIS — R5383 Other fatigue: Secondary | ICD-10-CM | POA: Diagnosis not present

## 2016-09-25 DIAGNOSIS — M1990 Unspecified osteoarthritis, unspecified site: Secondary | ICD-10-CM | POA: Diagnosis not present

## 2016-09-25 DIAGNOSIS — D649 Anemia, unspecified: Secondary | ICD-10-CM | POA: Diagnosis not present

## 2016-09-25 DIAGNOSIS — Z0000 Encounter for general adult medical examination without abnormal findings: Secondary | ICD-10-CM | POA: Diagnosis not present

## 2016-10-09 DIAGNOSIS — N959 Unspecified menopausal and perimenopausal disorder: Secondary | ICD-10-CM | POA: Diagnosis not present

## 2016-10-09 DIAGNOSIS — Z1151 Encounter for screening for human papillomavirus (HPV): Secondary | ICD-10-CM | POA: Diagnosis not present

## 2016-10-09 DIAGNOSIS — N952 Postmenopausal atrophic vaginitis: Secondary | ICD-10-CM | POA: Diagnosis not present

## 2016-10-09 DIAGNOSIS — Z1231 Encounter for screening mammogram for malignant neoplasm of breast: Secondary | ICD-10-CM | POA: Diagnosis not present

## 2016-10-09 DIAGNOSIS — Z1272 Encounter for screening for malignant neoplasm of vagina: Secondary | ICD-10-CM | POA: Diagnosis not present

## 2016-10-19 DIAGNOSIS — F410 Panic disorder [episodic paroxysmal anxiety] without agoraphobia: Secondary | ICD-10-CM | POA: Diagnosis not present

## 2016-10-19 DIAGNOSIS — F411 Generalized anxiety disorder: Secondary | ICD-10-CM | POA: Diagnosis not present

## 2017-01-30 DIAGNOSIS — Z87898 Personal history of other specified conditions: Secondary | ICD-10-CM | POA: Diagnosis not present

## 2017-01-31 DIAGNOSIS — D649 Anemia, unspecified: Secondary | ICD-10-CM | POA: Diagnosis not present

## 2017-02-06 DIAGNOSIS — B009 Herpesviral infection, unspecified: Secondary | ICD-10-CM | POA: Diagnosis not present

## 2017-02-06 DIAGNOSIS — F411 Generalized anxiety disorder: Secondary | ICD-10-CM | POA: Diagnosis not present

## 2017-02-06 DIAGNOSIS — K219 Gastro-esophageal reflux disease without esophagitis: Secondary | ICD-10-CM | POA: Diagnosis not present

## 2017-02-06 DIAGNOSIS — Z79890 Hormone replacement therapy (postmenopausal): Secondary | ICD-10-CM | POA: Diagnosis not present

## 2017-02-28 DIAGNOSIS — M8589 Other specified disorders of bone density and structure, multiple sites: Secondary | ICD-10-CM | POA: Diagnosis not present

## 2017-02-28 DIAGNOSIS — M19049 Primary osteoarthritis, unspecified hand: Secondary | ICD-10-CM | POA: Diagnosis not present

## 2017-02-28 DIAGNOSIS — M25441 Effusion, right hand: Secondary | ICD-10-CM | POA: Diagnosis not present

## 2017-03-07 DIAGNOSIS — H6120 Impacted cerumen, unspecified ear: Secondary | ICD-10-CM | POA: Diagnosis not present

## 2017-03-07 DIAGNOSIS — H905 Unspecified sensorineural hearing loss: Secondary | ICD-10-CM | POA: Diagnosis not present

## 2017-03-07 DIAGNOSIS — J3089 Other allergic rhinitis: Secondary | ICD-10-CM | POA: Diagnosis not present

## 2017-03-07 DIAGNOSIS — R05 Cough: Secondary | ICD-10-CM | POA: Diagnosis not present

## 2017-03-08 DIAGNOSIS — F410 Panic disorder [episodic paroxysmal anxiety] without agoraphobia: Secondary | ICD-10-CM | POA: Diagnosis not present

## 2017-03-08 DIAGNOSIS — F411 Generalized anxiety disorder: Secondary | ICD-10-CM | POA: Diagnosis not present

## 2017-03-21 DIAGNOSIS — R928 Other abnormal and inconclusive findings on diagnostic imaging of breast: Secondary | ICD-10-CM | POA: Diagnosis not present

## 2017-04-02 DIAGNOSIS — N6320 Unspecified lump in the left breast, unspecified quadrant: Secondary | ICD-10-CM | POA: Diagnosis not present

## 2017-07-03 DIAGNOSIS — F411 Generalized anxiety disorder: Secondary | ICD-10-CM | POA: Diagnosis not present

## 2017-07-03 DIAGNOSIS — F410 Panic disorder [episodic paroxysmal anxiety] without agoraphobia: Secondary | ICD-10-CM | POA: Diagnosis not present

## 2017-07-25 DIAGNOSIS — R05 Cough: Secondary | ICD-10-CM | POA: Diagnosis not present

## 2017-07-25 DIAGNOSIS — Z681 Body mass index (BMI) 19 or less, adult: Secondary | ICD-10-CM | POA: Diagnosis not present

## 2017-07-25 DIAGNOSIS — R509 Fever, unspecified: Secondary | ICD-10-CM | POA: Diagnosis not present

## 2017-07-25 DIAGNOSIS — J209 Acute bronchitis, unspecified: Secondary | ICD-10-CM | POA: Diagnosis not present

## 2017-08-09 DIAGNOSIS — M1712 Unilateral primary osteoarthritis, left knee: Secondary | ICD-10-CM | POA: Diagnosis not present

## 2017-08-09 DIAGNOSIS — M542 Cervicalgia: Secondary | ICD-10-CM | POA: Diagnosis not present

## 2017-08-09 DIAGNOSIS — M47812 Spondylosis without myelopathy or radiculopathy, cervical region: Secondary | ICD-10-CM | POA: Diagnosis not present

## 2017-08-09 DIAGNOSIS — M5136 Other intervertebral disc degeneration, lumbar region: Secondary | ICD-10-CM | POA: Diagnosis not present

## 2017-08-10 DIAGNOSIS — H02831 Dermatochalasis of right upper eyelid: Secondary | ICD-10-CM | POA: Diagnosis not present

## 2017-08-10 DIAGNOSIS — H25813 Combined forms of age-related cataract, bilateral: Secondary | ICD-10-CM | POA: Diagnosis not present

## 2017-08-10 DIAGNOSIS — H524 Presbyopia: Secondary | ICD-10-CM | POA: Diagnosis not present

## 2017-08-10 DIAGNOSIS — H02834 Dermatochalasis of left upper eyelid: Secondary | ICD-10-CM | POA: Diagnosis not present

## 2017-09-03 DIAGNOSIS — K210 Gastro-esophageal reflux disease with esophagitis: Secondary | ICD-10-CM | POA: Diagnosis not present

## 2017-09-03 DIAGNOSIS — M79641 Pain in right hand: Secondary | ICD-10-CM | POA: Diagnosis not present

## 2017-09-03 DIAGNOSIS — D6489 Other specified anemias: Secondary | ICD-10-CM | POA: Diagnosis not present

## 2017-09-03 DIAGNOSIS — R109 Unspecified abdominal pain: Secondary | ICD-10-CM | POA: Diagnosis not present

## 2017-09-03 DIAGNOSIS — K31819 Angiodysplasia of stomach and duodenum without bleeding: Secondary | ICD-10-CM | POA: Diagnosis not present

## 2017-09-03 DIAGNOSIS — K5904 Chronic idiopathic constipation: Secondary | ICD-10-CM | POA: Diagnosis not present

## 2017-09-04 DIAGNOSIS — H905 Unspecified sensorineural hearing loss: Secondary | ICD-10-CM | POA: Diagnosis not present

## 2017-09-04 DIAGNOSIS — H6120 Impacted cerumen, unspecified ear: Secondary | ICD-10-CM | POA: Diagnosis not present

## 2017-10-01 DIAGNOSIS — Z0000 Encounter for general adult medical examination without abnormal findings: Secondary | ICD-10-CM | POA: Diagnosis not present

## 2017-10-01 DIAGNOSIS — M1990 Unspecified osteoarthritis, unspecified site: Secondary | ICD-10-CM | POA: Diagnosis not present

## 2017-10-01 DIAGNOSIS — M859 Disorder of bone density and structure, unspecified: Secondary | ICD-10-CM | POA: Diagnosis not present

## 2017-10-01 DIAGNOSIS — I340 Nonrheumatic mitral (valve) insufficiency: Secondary | ICD-10-CM | POA: Diagnosis not present

## 2017-10-01 DIAGNOSIS — Z681 Body mass index (BMI) 19 or less, adult: Secondary | ICD-10-CM | POA: Diagnosis not present

## 2017-10-01 DIAGNOSIS — D6489 Other specified anemias: Secondary | ICD-10-CM | POA: Diagnosis not present

## 2017-10-01 DIAGNOSIS — R2231 Localized swelling, mass and lump, right upper limb: Secondary | ICD-10-CM | POA: Diagnosis not present

## 2017-10-01 DIAGNOSIS — J3089 Other allergic rhinitis: Secondary | ICD-10-CM | POA: Diagnosis not present

## 2017-10-01 DIAGNOSIS — L728 Other follicular cysts of the skin and subcutaneous tissue: Secondary | ICD-10-CM | POA: Diagnosis not present

## 2017-10-01 DIAGNOSIS — Z1389 Encounter for screening for other disorder: Secondary | ICD-10-CM | POA: Diagnosis not present

## 2017-10-01 DIAGNOSIS — I4510 Unspecified right bundle-branch block: Secondary | ICD-10-CM | POA: Diagnosis not present

## 2017-10-01 DIAGNOSIS — H8103 Meniere's disease, bilateral: Secondary | ICD-10-CM | POA: Diagnosis not present

## 2017-10-01 DIAGNOSIS — K31819 Angiodysplasia of stomach and duodenum without bleeding: Secondary | ICD-10-CM | POA: Diagnosis not present

## 2017-11-15 DIAGNOSIS — F410 Panic disorder [episodic paroxysmal anxiety] without agoraphobia: Secondary | ICD-10-CM | POA: Diagnosis not present

## 2017-11-15 DIAGNOSIS — F411 Generalized anxiety disorder: Secondary | ICD-10-CM | POA: Diagnosis not present

## 2017-11-19 DIAGNOSIS — H02831 Dermatochalasis of right upper eyelid: Secondary | ICD-10-CM | POA: Diagnosis not present

## 2017-11-19 DIAGNOSIS — H57813 Brow ptosis, bilateral: Secondary | ICD-10-CM | POA: Diagnosis not present

## 2017-11-19 DIAGNOSIS — H02834 Dermatochalasis of left upper eyelid: Secondary | ICD-10-CM | POA: Diagnosis not present

## 2017-11-28 DIAGNOSIS — M79672 Pain in left foot: Secondary | ICD-10-CM | POA: Diagnosis not present

## 2017-11-28 DIAGNOSIS — W208XXA Other cause of strike by thrown, projected or falling object, initial encounter: Secondary | ICD-10-CM | POA: Diagnosis not present

## 2017-11-28 DIAGNOSIS — S9032XA Contusion of left foot, initial encounter: Secondary | ICD-10-CM | POA: Diagnosis not present

## 2017-11-28 DIAGNOSIS — S99912A Unspecified injury of left ankle, initial encounter: Secondary | ICD-10-CM | POA: Diagnosis not present

## 2017-12-19 ENCOUNTER — Telehealth: Payer: Self-pay | Admitting: Interventional Cardiology

## 2017-12-19 NOTE — Telephone Encounter (Signed)
° °  [facility] Pre-operative Risk Assessment    Request for surgical clearance:  1. What type of surgery is being performed? Right Thumb CMC arthroplasty with APL transfer Rt Wrist STS Release, Rt Long Finger Mass Excision  2. When is this surgery scheduled? 01/30/2018  3. What type of clearance is required (medical clearance vs. Pharmacy clearance to hold med vs. Both)? Medical  4. Are there any medications that need to be held prior to surgery and how long? Not listed  5. Practice name and name of physician performing surgery? [facility], Dr. Charlotte Crumb  6. What is your office phone number [phone]   7.   What is your office fax number [phone]  ATTN: Cyndee Brightly, RN  8.   Anesthesia type (None, local, MAC, general) ? Not listed   Haley Wade 12/19/2017, 2:59 PM  _________________________________________________________________   (provider comments below)

## 2017-12-20 NOTE — Telephone Encounter (Signed)
   Primary Cardiologist:Haley Nicholes Stairs III, MD  Chart reviewed as part of pre-operative protocol coverage. Because of Haley Wade past medical history and time since last visit (not seen since 2015), he/she will require a follow-up visit in order to better assess preoperative cardiovascular risk.  Pre-op covering staff: - Please schedule appointment and call patient to inform them. - Please contact requesting surgeon's office via preferred method (i.e, phone, fax) to inform them of need for appointment prior to surgery.  Murray Hodgkins, NP  12/20/2017, 4:24 PM

## 2018-01-21 ENCOUNTER — Other Ambulatory Visit: Payer: Self-pay | Admitting: Orthopedic Surgery

## 2018-01-28 DIAGNOSIS — L659 Nonscarring hair loss, unspecified: Secondary | ICD-10-CM | POA: Diagnosis not present

## 2018-01-28 DIAGNOSIS — Z79890 Hormone replacement therapy (postmenopausal): Secondary | ICD-10-CM | POA: Diagnosis not present

## 2018-01-28 DIAGNOSIS — F411 Generalized anxiety disorder: Secondary | ICD-10-CM | POA: Diagnosis not present

## 2018-01-28 DIAGNOSIS — K219 Gastro-esophageal reflux disease without esophagitis: Secondary | ICD-10-CM | POA: Diagnosis not present

## 2018-01-28 DIAGNOSIS — E7800 Pure hypercholesterolemia, unspecified: Secondary | ICD-10-CM | POA: Diagnosis not present

## 2018-01-29 DIAGNOSIS — D6489 Other specified anemias: Secondary | ICD-10-CM | POA: Diagnosis not present

## 2018-01-29 DIAGNOSIS — K31819 Angiodysplasia of stomach and duodenum without bleeding: Secondary | ICD-10-CM | POA: Diagnosis not present

## 2018-01-29 DIAGNOSIS — D649 Anemia, unspecified: Secondary | ICD-10-CM | POA: Diagnosis not present

## 2018-01-29 DIAGNOSIS — K219 Gastro-esophageal reflux disease without esophagitis: Secondary | ICD-10-CM | POA: Diagnosis not present

## 2018-01-29 DIAGNOSIS — D500 Iron deficiency anemia secondary to blood loss (chronic): Secondary | ICD-10-CM | POA: Diagnosis not present

## 2018-01-29 DIAGNOSIS — M859 Disorder of bone density and structure, unspecified: Secondary | ICD-10-CM | POA: Diagnosis not present

## 2018-01-29 DIAGNOSIS — K5904 Chronic idiopathic constipation: Secondary | ICD-10-CM | POA: Diagnosis not present

## 2018-01-30 DIAGNOSIS — M1811 Unilateral primary osteoarthritis of first carpometacarpal joint, right hand: Secondary | ICD-10-CM | POA: Diagnosis not present

## 2018-01-30 DIAGNOSIS — G8918 Other acute postprocedural pain: Secondary | ICD-10-CM | POA: Diagnosis not present

## 2018-01-30 DIAGNOSIS — M654 Radial styloid tenosynovitis [de Quervain]: Secondary | ICD-10-CM | POA: Diagnosis not present

## 2018-01-30 DIAGNOSIS — C499 Malignant neoplasm of connective and soft tissue, unspecified: Secondary | ICD-10-CM | POA: Diagnosis not present

## 2018-01-30 DIAGNOSIS — M67441 Ganglion, right hand: Secondary | ICD-10-CM | POA: Diagnosis not present

## 2018-02-05 DIAGNOSIS — R2231 Localized swelling, mass and lump, right upper limb: Secondary | ICD-10-CM | POA: Diagnosis not present

## 2018-02-13 DIAGNOSIS — D225 Melanocytic nevi of trunk: Secondary | ICD-10-CM | POA: Diagnosis not present

## 2018-02-13 DIAGNOSIS — D2272 Melanocytic nevi of left lower limb, including hip: Secondary | ICD-10-CM | POA: Diagnosis not present

## 2018-02-13 DIAGNOSIS — D485 Neoplasm of uncertain behavior of skin: Secondary | ICD-10-CM | POA: Diagnosis not present

## 2018-02-13 DIAGNOSIS — L821 Other seborrheic keratosis: Secondary | ICD-10-CM | POA: Diagnosis not present

## 2018-02-26 DIAGNOSIS — M069 Rheumatoid arthritis, unspecified: Secondary | ICD-10-CM | POA: Diagnosis not present

## 2018-02-26 DIAGNOSIS — H02834 Dermatochalasis of left upper eyelid: Secondary | ICD-10-CM | POA: Diagnosis not present

## 2018-02-26 DIAGNOSIS — H02831 Dermatochalasis of right upper eyelid: Secondary | ICD-10-CM | POA: Diagnosis not present

## 2018-02-26 DIAGNOSIS — H57813 Brow ptosis, bilateral: Secondary | ICD-10-CM | POA: Diagnosis not present

## 2018-02-26 DIAGNOSIS — H02403 Unspecified ptosis of bilateral eyelids: Secondary | ICD-10-CM | POA: Diagnosis not present

## 2018-02-26 DIAGNOSIS — Z79899 Other long term (current) drug therapy: Secondary | ICD-10-CM | POA: Diagnosis not present

## 2018-07-03 DIAGNOSIS — D485 Neoplasm of uncertain behavior of skin: Secondary | ICD-10-CM | POA: Diagnosis not present

## 2018-08-30 DIAGNOSIS — R109 Unspecified abdominal pain: Secondary | ICD-10-CM | POA: Diagnosis not present

## 2018-08-30 DIAGNOSIS — K31819 Angiodysplasia of stomach and duodenum without bleeding: Secondary | ICD-10-CM | POA: Diagnosis not present

## 2018-08-30 DIAGNOSIS — D500 Iron deficiency anemia secondary to blood loss (chronic): Secondary | ICD-10-CM | POA: Diagnosis not present

## 2018-09-10 DIAGNOSIS — F411 Generalized anxiety disorder: Secondary | ICD-10-CM | POA: Diagnosis not present

## 2018-09-10 DIAGNOSIS — F410 Panic disorder [episodic paroxysmal anxiety] without agoraphobia: Secondary | ICD-10-CM | POA: Diagnosis not present

## 2018-10-14 DIAGNOSIS — R319 Hematuria, unspecified: Secondary | ICD-10-CM | POA: Diagnosis not present

## 2018-10-14 DIAGNOSIS — N3946 Mixed incontinence: Secondary | ICD-10-CM | POA: Diagnosis not present

## 2018-10-14 DIAGNOSIS — N8111 Cystocele, midline: Secondary | ICD-10-CM | POA: Diagnosis not present

## 2018-10-14 DIAGNOSIS — R300 Dysuria: Secondary | ICD-10-CM | POA: Diagnosis not present

## 2018-10-29 DIAGNOSIS — N8111 Cystocele, midline: Secondary | ICD-10-CM | POA: Diagnosis not present

## 2018-11-05 DIAGNOSIS — E559 Vitamin D deficiency, unspecified: Secondary | ICD-10-CM | POA: Diagnosis not present

## 2018-11-05 DIAGNOSIS — F419 Anxiety disorder, unspecified: Secondary | ICD-10-CM | POA: Diagnosis not present

## 2018-11-05 DIAGNOSIS — K31819 Angiodysplasia of stomach and duodenum without bleeding: Secondary | ICD-10-CM | POA: Diagnosis not present

## 2018-11-05 DIAGNOSIS — Z0000 Encounter for general adult medical examination without abnormal findings: Secondary | ICD-10-CM | POA: Diagnosis not present

## 2018-11-05 DIAGNOSIS — K219 Gastro-esophageal reflux disease without esophagitis: Secondary | ICD-10-CM | POA: Diagnosis not present

## 2018-11-05 DIAGNOSIS — M859 Disorder of bone density and structure, unspecified: Secondary | ICD-10-CM | POA: Diagnosis not present

## 2018-11-05 DIAGNOSIS — I340 Nonrheumatic mitral (valve) insufficiency: Secondary | ICD-10-CM | POA: Diagnosis not present

## 2018-11-05 DIAGNOSIS — H6990 Unspecified Eustachian tube disorder, unspecified ear: Secondary | ICD-10-CM | POA: Diagnosis not present

## 2018-11-05 DIAGNOSIS — N329 Bladder disorder, unspecified: Secondary | ICD-10-CM | POA: Diagnosis not present

## 2018-11-05 DIAGNOSIS — D649 Anemia, unspecified: Secondary | ICD-10-CM | POA: Diagnosis not present

## 2018-11-05 DIAGNOSIS — H8109 Meniere's disease, unspecified ear: Secondary | ICD-10-CM | POA: Diagnosis not present

## 2018-11-05 DIAGNOSIS — M1990 Unspecified osteoarthritis, unspecified site: Secondary | ICD-10-CM | POA: Diagnosis not present

## 2018-11-26 DIAGNOSIS — R82998 Other abnormal findings in urine: Secondary | ICD-10-CM | POA: Diagnosis not present

## 2018-12-03 DIAGNOSIS — F410 Panic disorder [episodic paroxysmal anxiety] without agoraphobia: Secondary | ICD-10-CM | POA: Diagnosis not present

## 2018-12-03 DIAGNOSIS — F411 Generalized anxiety disorder: Secondary | ICD-10-CM | POA: Diagnosis not present

## 2018-12-09 DIAGNOSIS — D1801 Hemangioma of skin and subcutaneous tissue: Secondary | ICD-10-CM | POA: Diagnosis not present

## 2018-12-09 DIAGNOSIS — D485 Neoplasm of uncertain behavior of skin: Secondary | ICD-10-CM | POA: Diagnosis not present

## 2018-12-09 DIAGNOSIS — Z872 Personal history of diseases of the skin and subcutaneous tissue: Secondary | ICD-10-CM | POA: Diagnosis not present

## 2018-12-09 DIAGNOSIS — L821 Other seborrheic keratosis: Secondary | ICD-10-CM | POA: Diagnosis not present

## 2018-12-30 DIAGNOSIS — Z1159 Encounter for screening for other viral diseases: Secondary | ICD-10-CM | POA: Diagnosis not present

## 2019-01-03 DIAGNOSIS — N8110 Cystocele, unspecified: Secondary | ICD-10-CM | POA: Diagnosis not present

## 2019-01-03 DIAGNOSIS — N8111 Cystocele, midline: Secondary | ICD-10-CM | POA: Diagnosis not present

## 2019-01-03 DIAGNOSIS — I341 Nonrheumatic mitral (valve) prolapse: Secondary | ICD-10-CM | POA: Diagnosis not present

## 2019-01-15 DIAGNOSIS — R399 Unspecified symptoms and signs involving the genitourinary system: Secondary | ICD-10-CM | POA: Diagnosis not present

## 2019-02-13 DIAGNOSIS — N952 Postmenopausal atrophic vaginitis: Secondary | ICD-10-CM | POA: Diagnosis not present

## 2019-02-13 DIAGNOSIS — Z681 Body mass index (BMI) 19 or less, adult: Secondary | ICD-10-CM | POA: Diagnosis not present

## 2019-02-13 DIAGNOSIS — N959 Unspecified menopausal and perimenopausal disorder: Secondary | ICD-10-CM | POA: Diagnosis not present

## 2019-02-24 DIAGNOSIS — F410 Panic disorder [episodic paroxysmal anxiety] without agoraphobia: Secondary | ICD-10-CM | POA: Diagnosis not present

## 2019-02-24 DIAGNOSIS — F411 Generalized anxiety disorder: Secondary | ICD-10-CM | POA: Diagnosis not present

## 2019-04-17 DIAGNOSIS — R109 Unspecified abdominal pain: Secondary | ICD-10-CM | POA: Diagnosis not present

## 2019-04-17 DIAGNOSIS — K31819 Angiodysplasia of stomach and duodenum without bleeding: Secondary | ICD-10-CM | POA: Diagnosis not present

## 2019-04-17 DIAGNOSIS — D500 Iron deficiency anemia secondary to blood loss (chronic): Secondary | ICD-10-CM | POA: Diagnosis not present

## 2019-04-17 DIAGNOSIS — K5904 Chronic idiopathic constipation: Secondary | ICD-10-CM | POA: Diagnosis not present

## 2019-04-23 DIAGNOSIS — Z681 Body mass index (BMI) 19 or less, adult: Secondary | ICD-10-CM | POA: Diagnosis not present

## 2019-04-23 DIAGNOSIS — M150 Primary generalized (osteo)arthritis: Secondary | ICD-10-CM | POA: Diagnosis not present

## 2019-04-23 DIAGNOSIS — M2550 Pain in unspecified joint: Secondary | ICD-10-CM | POA: Diagnosis not present

## 2019-04-23 DIAGNOSIS — K31819 Angiodysplasia of stomach and duodenum without bleeding: Secondary | ICD-10-CM | POA: Diagnosis not present

## 2019-04-23 DIAGNOSIS — M8580 Other specified disorders of bone density and structure, unspecified site: Secondary | ICD-10-CM | POA: Diagnosis not present

## 2019-04-23 DIAGNOSIS — D500 Iron deficiency anemia secondary to blood loss (chronic): Secondary | ICD-10-CM | POA: Diagnosis not present

## 2019-05-09 DIAGNOSIS — M1990 Unspecified osteoarthritis, unspecified site: Secondary | ICD-10-CM | POA: Diagnosis not present

## 2019-05-09 DIAGNOSIS — M79643 Pain in unspecified hand: Secondary | ICD-10-CM | POA: Diagnosis not present

## 2019-05-09 DIAGNOSIS — N329 Bladder disorder, unspecified: Secondary | ICD-10-CM | POA: Diagnosis not present

## 2019-05-09 DIAGNOSIS — F419 Anxiety disorder, unspecified: Secondary | ICD-10-CM | POA: Diagnosis not present

## 2019-05-09 DIAGNOSIS — D649 Anemia, unspecified: Secondary | ICD-10-CM | POA: Diagnosis not present

## 2019-05-09 DIAGNOSIS — K31819 Angiodysplasia of stomach and duodenum without bleeding: Secondary | ICD-10-CM | POA: Diagnosis not present

## 2019-05-15 DIAGNOSIS — H608X3 Other otitis externa, bilateral: Secondary | ICD-10-CM | POA: Diagnosis not present

## 2019-05-15 DIAGNOSIS — H6123 Impacted cerumen, bilateral: Secondary | ICD-10-CM | POA: Diagnosis not present

## 2019-05-15 DIAGNOSIS — H903 Sensorineural hearing loss, bilateral: Secondary | ICD-10-CM | POA: Diagnosis not present

## 2019-05-15 DIAGNOSIS — H905 Unspecified sensorineural hearing loss: Secondary | ICD-10-CM | POA: Diagnosis not present

## 2019-05-15 DIAGNOSIS — L309 Dermatitis, unspecified: Secondary | ICD-10-CM | POA: Diagnosis not present

## 2019-05-15 DIAGNOSIS — Z974 Presence of external hearing-aid: Secondary | ICD-10-CM | POA: Diagnosis not present

## 2019-05-17 DIAGNOSIS — Z23 Encounter for immunization: Secondary | ICD-10-CM | POA: Diagnosis not present

## 2019-05-19 DIAGNOSIS — M19049 Primary osteoarthritis, unspecified hand: Secondary | ICD-10-CM | POA: Diagnosis not present

## 2019-05-19 DIAGNOSIS — M8589 Other specified disorders of bone density and structure, multiple sites: Secondary | ICD-10-CM | POA: Diagnosis not present

## 2019-05-19 DIAGNOSIS — M25441 Effusion, right hand: Secondary | ICD-10-CM | POA: Diagnosis not present

## 2019-05-22 DIAGNOSIS — M7542 Impingement syndrome of left shoulder: Secondary | ICD-10-CM | POA: Diagnosis not present

## 2019-05-22 DIAGNOSIS — M5136 Other intervertebral disc degeneration, lumbar region: Secondary | ICD-10-CM | POA: Diagnosis not present

## 2019-05-22 DIAGNOSIS — M47812 Spondylosis without myelopathy or radiculopathy, cervical region: Secondary | ICD-10-CM | POA: Diagnosis not present

## 2019-06-18 DIAGNOSIS — R531 Weakness: Secondary | ICD-10-CM | POA: Diagnosis not present

## 2019-06-18 DIAGNOSIS — M1712 Unilateral primary osteoarthritis, left knee: Secondary | ICD-10-CM | POA: Diagnosis not present

## 2019-06-18 DIAGNOSIS — M545 Low back pain: Secondary | ICD-10-CM | POA: Diagnosis not present

## 2019-06-18 DIAGNOSIS — M47812 Spondylosis without myelopathy or radiculopathy, cervical region: Secondary | ICD-10-CM | POA: Diagnosis not present

## 2019-06-18 DIAGNOSIS — M5136 Other intervertebral disc degeneration, lumbar region: Secondary | ICD-10-CM | POA: Diagnosis not present

## 2019-06-18 DIAGNOSIS — M542 Cervicalgia: Secondary | ICD-10-CM | POA: Diagnosis not present

## 2019-06-24 DIAGNOSIS — R531 Weakness: Secondary | ICD-10-CM | POA: Diagnosis not present

## 2019-06-24 DIAGNOSIS — M545 Low back pain: Secondary | ICD-10-CM | POA: Diagnosis not present

## 2019-06-27 DIAGNOSIS — M5126 Other intervertebral disc displacement, lumbar region: Secondary | ICD-10-CM | POA: Diagnosis not present

## 2019-06-30 DIAGNOSIS — M5030 Other cervical disc degeneration, unspecified cervical region: Secondary | ICD-10-CM | POA: Diagnosis not present

## 2019-06-30 DIAGNOSIS — G96191 Perineural cyst: Secondary | ICD-10-CM | POA: Diagnosis not present

## 2019-06-30 DIAGNOSIS — M5021 Other cervical disc displacement,  high cervical region: Secondary | ICD-10-CM | POA: Diagnosis not present

## 2019-06-30 DIAGNOSIS — M5031 Other cervical disc degeneration,  high cervical region: Secondary | ICD-10-CM | POA: Diagnosis not present

## 2019-06-30 DIAGNOSIS — M47812 Spondylosis without myelopathy or radiculopathy, cervical region: Secondary | ICD-10-CM | POA: Diagnosis not present

## 2019-06-30 DIAGNOSIS — M4184 Other forms of scoliosis, thoracic region: Secondary | ICD-10-CM | POA: Diagnosis not present

## 2019-07-01 DIAGNOSIS — R531 Weakness: Secondary | ICD-10-CM | POA: Diagnosis not present

## 2019-07-01 DIAGNOSIS — M545 Low back pain: Secondary | ICD-10-CM | POA: Diagnosis not present

## 2019-07-02 DIAGNOSIS — M5030 Other cervical disc degeneration, unspecified cervical region: Secondary | ICD-10-CM | POA: Diagnosis not present

## 2019-07-02 DIAGNOSIS — M5136 Other intervertebral disc degeneration, lumbar region: Secondary | ICD-10-CM | POA: Diagnosis not present

## 2019-07-02 DIAGNOSIS — Z7189 Other specified counseling: Secondary | ICD-10-CM | POA: Diagnosis not present

## 2019-07-03 DIAGNOSIS — R531 Weakness: Secondary | ICD-10-CM | POA: Diagnosis not present

## 2019-07-03 DIAGNOSIS — M436 Torticollis: Secondary | ICD-10-CM | POA: Diagnosis not present

## 2019-07-07 DIAGNOSIS — R300 Dysuria: Secondary | ICD-10-CM | POA: Diagnosis not present

## 2019-08-25 DIAGNOSIS — F411 Generalized anxiety disorder: Secondary | ICD-10-CM | POA: Diagnosis not present

## 2019-08-25 DIAGNOSIS — F410 Panic disorder [episodic paroxysmal anxiety] without agoraphobia: Secondary | ICD-10-CM | POA: Diagnosis not present

## 2019-08-29 DIAGNOSIS — D1801 Hemangioma of skin and subcutaneous tissue: Secondary | ICD-10-CM | POA: Diagnosis not present

## 2019-08-29 DIAGNOSIS — L821 Other seborrheic keratosis: Secondary | ICD-10-CM | POA: Diagnosis not present

## 2019-08-29 DIAGNOSIS — Z872 Personal history of diseases of the skin and subcutaneous tissue: Secondary | ICD-10-CM | POA: Diagnosis not present

## 2019-08-29 DIAGNOSIS — L578 Other skin changes due to chronic exposure to nonionizing radiation: Secondary | ICD-10-CM | POA: Diagnosis not present

## 2019-08-29 DIAGNOSIS — D485 Neoplasm of uncertain behavior of skin: Secondary | ICD-10-CM | POA: Diagnosis not present

## 2019-09-11 DIAGNOSIS — D225 Melanocytic nevi of trunk: Secondary | ICD-10-CM | POA: Diagnosis not present

## 2019-09-18 DIAGNOSIS — Z681 Body mass index (BMI) 19 or less, adult: Secondary | ICD-10-CM | POA: Diagnosis not present

## 2019-09-18 DIAGNOSIS — N959 Unspecified menopausal and perimenopausal disorder: Secondary | ICD-10-CM | POA: Diagnosis not present

## 2019-09-18 DIAGNOSIS — N952 Postmenopausal atrophic vaginitis: Secondary | ICD-10-CM | POA: Diagnosis not present

## 2019-09-30 DIAGNOSIS — H9209 Otalgia, unspecified ear: Secondary | ICD-10-CM | POA: Diagnosis not present

## 2019-10-06 ENCOUNTER — Other Ambulatory Visit: Payer: Self-pay | Admitting: Gastroenterology

## 2019-10-06 DIAGNOSIS — R109 Unspecified abdominal pain: Secondary | ICD-10-CM | POA: Diagnosis not present

## 2019-10-06 DIAGNOSIS — K31819 Angiodysplasia of stomach and duodenum without bleeding: Secondary | ICD-10-CM | POA: Diagnosis not present

## 2019-10-06 DIAGNOSIS — K219 Gastro-esophageal reflux disease without esophagitis: Secondary | ICD-10-CM | POA: Diagnosis not present

## 2019-10-09 ENCOUNTER — Encounter (HOSPITAL_COMMUNITY): Payer: Self-pay | Admitting: Gastroenterology

## 2019-10-10 ENCOUNTER — Other Ambulatory Visit (HOSPITAL_COMMUNITY)
Admission: RE | Admit: 2019-10-10 | Discharge: 2019-10-10 | Disposition: A | Discharge status: Home / Self Care | Payer: Medicare Other | Source: Ambulatory Visit | Attending: Gastroenterology | Admitting: Gastroenterology

## 2019-10-10 DIAGNOSIS — Z136 Encounter for screening for cardiovascular disorders: Secondary | ICD-10-CM | POA: Diagnosis not present

## 2019-10-10 DIAGNOSIS — Z20822 Contact with and (suspected) exposure to covid-19: Secondary | ICD-10-CM | POA: Insufficient documentation

## 2019-10-10 DIAGNOSIS — K219 Gastro-esophageal reflux disease without esophagitis: Secondary | ICD-10-CM | POA: Diagnosis not present

## 2019-10-10 DIAGNOSIS — Z01812 Encounter for preprocedural laboratory examination: Secondary | ICD-10-CM | POA: Insufficient documentation

## 2019-10-10 DIAGNOSIS — E559 Vitamin D deficiency, unspecified: Secondary | ICD-10-CM | POA: Diagnosis not present

## 2019-10-10 DIAGNOSIS — K31819 Angiodysplasia of stomach and duodenum without bleeding: Secondary | ICD-10-CM | POA: Diagnosis not present

## 2019-10-10 DIAGNOSIS — R109 Unspecified abdominal pain: Secondary | ICD-10-CM | POA: Diagnosis not present

## 2019-10-10 DIAGNOSIS — Z1329 Encounter for screening for other suspected endocrine disorder: Secondary | ICD-10-CM | POA: Diagnosis not present

## 2019-10-10 LAB — SARS CORONAVIRUS 2 (TAT 6-24 HRS): SARS Coronavirus 2: NEGATIVE

## 2019-10-13 NOTE — Anesthesia Preprocedure Evaluation (Addendum)
Anesthesia Evaluation  Patient identified by MRN, date of birth, ID band Patient awake    Reviewed: Allergy & Precautions, Patient's Chart, lab work & pertinent test results  Airway Mallampati: II  TM Distance: >3 FB Neck ROM: Full    Dental no notable dental hx. (+) Implants, Teeth Intact, Dental Advisory Given   Pulmonary COPD, former smoker,    Pulmonary exam normal breath sounds clear to auscultation       Cardiovascular negative cardio ROS Normal cardiovascular exam Rhythm:Regular Rate:Normal     Neuro/Psych    GI/Hepatic negative GI ROS, Neg liver ROS,   Endo/Other  negative endocrine ROS  Renal/GU negative Renal ROS     Musculoskeletal negative musculoskeletal ROS (+)   Abdominal   Peds  Hematology negative hematology ROS (+)   Anesthesia Other Findings   Reproductive/Obstetrics                            Anesthesia Physical Anesthesia Plan  ASA: III  Anesthesia Plan: MAC   Post-op Pain Management:    Induction: Intravenous  PONV Risk Score and Plan: Treatment may vary due to age or medical condition  Airway Management Planned: Nasal Cannula and Natural Airway  Additional Equipment:   Intra-op Plan:   Post-operative Plan:   Informed Consent: I have reviewed the patients History and Physical, chart, labs and discussed the procedure including the risks, benefits and alternatives for the proposed anesthesia with the patient or authorized representative who has indicated his/her understanding and acceptance.     Dental advisory given  Plan Discussed with: CRNA  Anesthesia Plan Comments: (EGD for [facility])       Anesthesia Quick Evaluation

## 2019-10-14 ENCOUNTER — Ambulatory Visit (HOSPITAL_COMMUNITY)
Admission: RE | Admit: 2019-10-14 | Discharge: 2019-10-14 | Disposition: A | Discharge status: Home / Self Care | Payer: Medicare Other | Attending: Gastroenterology | Admitting: Gastroenterology

## 2019-10-14 ENCOUNTER — Ambulatory Visit (HOSPITAL_COMMUNITY): Payer: Medicare Other | Admitting: Certified Registered Nurse Anesthetist

## 2019-10-14 ENCOUNTER — Encounter (HOSPITAL_COMMUNITY)
Admission: RE | Disposition: A | Discharge status: Home / Self Care | Payer: Self-pay | Source: Home / Self Care | Attending: Gastroenterology

## 2019-10-14 ENCOUNTER — Encounter (HOSPITAL_COMMUNITY): Payer: Self-pay | Admitting: Gastroenterology

## 2019-10-14 ENCOUNTER — Other Ambulatory Visit: Payer: Self-pay

## 2019-10-14 DIAGNOSIS — Z87891 Personal history of nicotine dependence: Secondary | ICD-10-CM | POA: Insufficient documentation

## 2019-10-14 DIAGNOSIS — K449 Diaphragmatic hernia without obstruction or gangrene: Secondary | ICD-10-CM | POA: Insufficient documentation

## 2019-10-14 DIAGNOSIS — Z885 Allergy status to narcotic agent status: Secondary | ICD-10-CM | POA: Insufficient documentation

## 2019-10-14 DIAGNOSIS — K31819 Angiodysplasia of stomach and duodenum without bleeding: Secondary | ICD-10-CM | POA: Diagnosis not present

## 2019-10-14 DIAGNOSIS — Z79899 Other long term (current) drug therapy: Secondary | ICD-10-CM | POA: Diagnosis not present

## 2019-10-14 DIAGNOSIS — R1013 Epigastric pain: Secondary | ICD-10-CM | POA: Diagnosis not present

## 2019-10-14 DIAGNOSIS — L659 Nonscarring hair loss, unspecified: Secondary | ICD-10-CM | POA: Insufficient documentation

## 2019-10-14 DIAGNOSIS — K2100 Gastro-esophageal reflux disease with esophagitis, without bleeding: Secondary | ICD-10-CM | POA: Diagnosis not present

## 2019-10-14 DIAGNOSIS — J449 Chronic obstructive pulmonary disease, unspecified: Secondary | ICD-10-CM | POA: Insufficient documentation

## 2019-10-14 DIAGNOSIS — Z79890 Hormone replacement therapy (postmenopausal): Secondary | ICD-10-CM | POA: Insufficient documentation

## 2019-10-14 DIAGNOSIS — I341 Nonrheumatic mitral (valve) prolapse: Secondary | ICD-10-CM | POA: Diagnosis not present

## 2019-10-14 HISTORY — PX: ESOPHAGOGASTRODUODENOSCOPY (EGD) WITH PROPOFOL: SHX5813

## 2019-10-14 HISTORY — PX: GI RADIOFREQUENCY ABLATION: SHX6807

## 2019-10-14 SURGERY — ESOPHAGOGASTRODUODENOSCOPY (EGD) WITH PROPOFOL
Anesthesia: Monitor Anesthesia Care

## 2019-10-14 MED ORDER — PROPOFOL 500 MG/50ML IV EMUL
INTRAVENOUS | Status: DC | PRN
  Administered 2019-10-14: 75 ug/kg/min via INTRAVENOUS

## 2019-10-14 MED ORDER — LIDOCAINE 2% (20 MG/ML) 5 ML SYRINGE
INTRAMUSCULAR | Status: DC | PRN
  Administered 2019-10-14: 40 mg via INTRAVENOUS

## 2019-10-14 MED ORDER — PROPOFOL 10 MG/ML IV BOLUS
INTRAVENOUS | Status: AC
  Filled 2019-10-14: qty 20

## 2019-10-14 MED ORDER — PROPOFOL 500 MG/50ML IV EMUL
INTRAVENOUS | Status: AC
  Filled 2019-10-14: qty 50

## 2019-10-14 MED ORDER — SODIUM CHLORIDE 0.9 % IV SOLN: INTRAVENOUS | Status: DC

## 2019-10-14 MED ORDER — PROPOFOL 10 MG/ML IV BOLUS
INTRAVENOUS | Status: DC | PRN
  Administered 2019-10-14 (×3): 20 mg via INTRAVENOUS

## 2019-10-14 MED ORDER — LACTATED RINGERS IV SOLN
INTRAVENOUS | Status: DC
  Administered 2019-10-14: 1000 mL via INTRAVENOUS

## 2019-10-14 SURGICAL SUPPLY — 15 items

## 2019-10-14 NOTE — Transfer of Care (Signed)
Immediate Anesthesia Transfer of Care Note  Patient: Haley Wade  Procedure(s) Performed: ESOPHAGOGASTRODUODENOSCOPY (EGD) WITH PROPOFOL (N/A ) GI RADIOFREQUENCY ABLATION  Patient Location: PACU  Anesthesia Type:MAC  Level of Consciousness: awake, alert , oriented and patient cooperative  Airway & Oxygen Therapy: Patient Spontanous Breathing and Patient connected to face mask oxygen  Post-op Assessment: Report given to RN and Post -op Vital signs reviewed and stable  Post vital signs: Reviewed and stable  Last Vitals:  Vitals Value Taken Time  BP    Temp    Pulse    Resp    SpO2      Last Pain:  Vitals:   10/14/19 0728  TempSrc: Oral  PainSc: 0-No pain         Complications: No apparent anesthesia complications

## 2019-10-14 NOTE — Op Note (Signed)
[facility] Patient Name: Haley Wade Procedure Date: 10/14/2019 MRN: [id number] Attending MD: Clarene Essex , MD Date of Birth: 07-Aug-1946 CSN: [id number] Age: 74 Admit Type: Inpatient Procedure:                Upper GI endoscopy Indications:              Epigastric abdominal pain, Watermelon stomach (GAVE                            syndrome) Providers:                Clarene Essex, MD, Burtis Junes, RN, Lina Sar,                            Technician, Christell Faith, CRNA Referring MD:              Medicines:                Propofol total dose 200 mg IV, 40 mg IV lidocaine Complications:            No immediate complications. Estimated Blood Loss:     Estimated blood loss: none. Procedure:                Pre-Anesthesia Assessment:                           - Prior to the procedure, a History and Physical                            was performed, and patient medications and                            allergies were reviewed. The patient's tolerance of                            previous anesthesia was also reviewed. The risks                            and benefits of the procedure and the sedation                            options and risks were discussed with the patient.                            All questions were answered, and informed consent                            was obtained. Prior Anticoagulants: The patient has                            taken no previous anticoagulant or antiplatelet                            agents. ASA Grade Assessment: II - A patient with  mild systemic disease. After reviewing the risks                            and benefits, the patient was deemed in                            satisfactory condition to undergo the procedure.                           After obtaining informed consent, the endoscope was                            passed under direct vision. Throughout the   procedure, the patient's blood pressure, pulse, and                            oxygen saturations were monitored continuously. The                            [id number]-[id number]) Olympus gastroscope was                            introduced through the mouth, and advanced to the                            third part of duodenum. The upper GI endoscopy was                            accomplished without difficulty. The patient                            tolerated the procedure well. Scope In: Scope Out: Findings:      A small hiatal hernia was present.      LA Grade A (one or more mucosal breaks less than 5 mm, not extending       between tops of 2 mucosal folds) esophagitis with no bleeding was found.       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 Focal radiofrequency ablation of gastric antral       vascular ectasia in the gastric antrum was performed. Ablation sites       were located at 29 cm from the incisors. With the endoscope in place,       the position and extent of the abnormal mucosa and appropriate anatomic       landmarks were noted. The radiofrequency channel ablation catheter was       introduced through the endoscope working channel. The endoscope with the       ablation catheter was advanced to the areas of abnormal mucosa. The       endoscope with the channel ablation catheter was positioned under direct       visualization so that the catheter was placed in contact with the       surface of the abnormal mucosa. Energy was applied twice at 12 J/cm2.       Ablation  was repeated in a likewise fashion to all visible abnormal       mucosa. The ablation catheter was removed through the endoscope working       channel. The areas where abnormal mucosa had been ablated were examined.       Whitish changes of ablated mucosa were present. There was a very small       amount of unablated  abnormal mucosa present. The endoscope was then       removed.      The duodenal bulb, first portion of the duodenum, second portion of the       duodenum and third portion of the duodenum were normal.      The exam was otherwise without abnormality. Impression:               - Small hiatal hernia.                           - LA Grade A reflux esophagitis with no bleeding.                           - Gastric antral vascular ectasia without bleeding.                            Treated with radiofrequency ablation.                           - Normal duodenal bulb, first portion of the                            duodenum, second portion of the duodenum and third                            portion of the duodenum.                           - The examination was otherwise normal.                           - No specimens collected. Moderate Sedation:      Not Applicable - Patient had care per Anesthesia. Recommendation:           - Patient has a contact number available for                            emergencies. The signs and symptoms of potential                            delayed complications were discussed with the                            patient. Return to normal activities tomorrow.                            Written discharge instructions were provided to the  patient.                           - Clear liquid diet for 4 hours.                           - Use Prilosec (omeprazole) 20 mg PO BID for 6                            weeks.                           - Return to GI clinic in 4 weeks.                           - Telephone GI clinic if symptomatic PRN.                            Retreatment as needed Procedure Code(s):        --- Professional ---                           [phone], Esophagogastroduodenoscopy, flexible,                            transoral; with control of bleeding, any method Diagnosis Code(s):        --- Professional ---                            K44.9, Diaphragmatic hernia without obstruction or                            gangrene                           K21.00, Gastro-esophageal reflux disease with                            esophagitis, without bleeding                           K31.819, Angiodysplasia of stomach and duodenum                            without bleeding                           R10.13, Epigastric pain CPT copyright 2019 [employer]. All rights reserved. The codes documented in this report are preliminary and upon coder review may  be revised to meet current compliance requirements. Clarene Essex, MD 10/14/2019 9:38:29 AM This report has been signed electronically. Number of Addenda: 0

## 2019-10-14 NOTE — Anesthesia Procedure Notes (Signed)
Procedure Name: MAC Date/Time: 10/14/2019 8:52 AM Performed by: West Pugh, CRNA Pre-anesthesia Checklist: Patient identified, Emergency Drugs available, Suction available, Patient being monitored and Timeout performed Patient Re-evaluated:Patient Re-evaluated prior to induction Oxygen Delivery Method: Simple face mask Preoxygenation: Pre-oxygenation with 100% oxygen Induction Type: IV induction Placement Confirmation: positive ETCO2 Dental Injury: Teeth and Oropharynx as per pre-operative assessment

## 2019-10-14 NOTE — Discharge Instructions (Signed)
Call if question or problem otherwise clear liquid diet for 4 hours and if doing well may have soft solids today and slowly advance tomorrow and increase omeprazole to 20 twice a day and follow-up in 1 month

## 2019-10-14 NOTE — Progress Notes (Signed)
Haley Wade 8:46 AM  Subjective: Patient doing well and only complains of her midepigastric pain and sometimes some reflux but sometimes she has it without eating and no new complaints and reassurance was given about her labs  Objective: Vital signs stable afebrile exam please see preassessment evaluation recent labs normal  Assessment: Midepigastric pain and history of gave  Plan: Okay to proceed with endoscopy with possible therapy with anesthesia assistance  [facility]  office [phone] After 5PM or if no answer call [phone]

## 2019-10-14 NOTE — Anesthesia Postprocedure Evaluation (Signed)
Anesthesia Post Note  Patient: Haley Wade  Procedure(s) Performed: ESOPHAGOGASTRODUODENOSCOPY (EGD) WITH PROPOFOL (N/A ) GI RADIOFREQUENCY ABLATION     Patient location during evaluation: Endoscopy Anesthesia Type: MAC Level of consciousness: awake and alert Pain management: pain level controlled Vital Signs Assessment: post-procedure vital signs reviewed and stable Respiratory status: spontaneous breathing, nonlabored ventilation, respiratory function stable and patient connected to nasal cannula oxygen Cardiovascular status: blood pressure returned to baseline and stable Postop Assessment: no apparent nausea or vomiting Anesthetic complications: no    Last Vitals:  Vitals:   10/14/19 0930 10/14/19 0940  BP: (!) 102/57 110/83  Pulse: 78 80  Resp: 18 14  Temp:    SpO2: 100% 100%    Last Pain:  Vitals:   10/14/19 0940  TempSrc:   PainSc: 0-No pain                 Barnet Glasgow

## 2019-10-15 ENCOUNTER — Encounter: Payer: Self-pay | Admitting: *Deleted

## 2019-11-06 DIAGNOSIS — M5432 Sciatica, left side: Secondary | ICD-10-CM | POA: Diagnosis not present

## 2019-11-07 DIAGNOSIS — K31819 Angiodysplasia of stomach and duodenum without bleeding: Secondary | ICD-10-CM | POA: Diagnosis not present

## 2019-11-07 DIAGNOSIS — D649 Anemia, unspecified: Secondary | ICD-10-CM | POA: Diagnosis not present

## 2019-11-07 DIAGNOSIS — L729 Follicular cyst of the skin and subcutaneous tissue, unspecified: Secondary | ICD-10-CM | POA: Diagnosis not present

## 2019-11-07 DIAGNOSIS — H6990 Unspecified Eustachian tube disorder, unspecified ear: Secondary | ICD-10-CM | POA: Diagnosis not present

## 2019-11-07 DIAGNOSIS — M1990 Unspecified osteoarthritis, unspecified site: Secondary | ICD-10-CM | POA: Diagnosis not present

## 2019-11-07 DIAGNOSIS — M8580 Other specified disorders of bone density and structure, unspecified site: Secondary | ICD-10-CM | POA: Diagnosis not present

## 2019-11-07 DIAGNOSIS — Z0000 Encounter for general adult medical examination without abnormal findings: Secondary | ICD-10-CM | POA: Diagnosis not present

## 2019-11-07 DIAGNOSIS — A6000 Herpesviral infection of urogenital system, unspecified: Secondary | ICD-10-CM | POA: Diagnosis not present

## 2019-11-07 DIAGNOSIS — H9190 Unspecified hearing loss, unspecified ear: Secondary | ICD-10-CM | POA: Diagnosis not present

## 2019-11-07 DIAGNOSIS — M79643 Pain in unspecified hand: Secondary | ICD-10-CM | POA: Diagnosis not present

## 2019-11-07 DIAGNOSIS — M5380 Other specified dorsopathies, site unspecified: Secondary | ICD-10-CM | POA: Diagnosis not present

## 2019-11-07 DIAGNOSIS — E559 Vitamin D deficiency, unspecified: Secondary | ICD-10-CM | POA: Diagnosis not present

## 2019-11-19 DIAGNOSIS — K31819 Angiodysplasia of stomach and duodenum without bleeding: Secondary | ICD-10-CM | POA: Diagnosis not present

## 2019-11-19 DIAGNOSIS — Z86010 Personal history of colonic polyps: Secondary | ICD-10-CM | POA: Diagnosis not present

## 2019-11-26 DIAGNOSIS — M5431 Sciatica, right side: Secondary | ICD-10-CM | POA: Diagnosis not present

## 2019-11-26 DIAGNOSIS — M5136 Other intervertebral disc degeneration, lumbar region: Secondary | ICD-10-CM | POA: Diagnosis not present

## 2019-11-26 DIAGNOSIS — M25461 Effusion, right knee: Secondary | ICD-10-CM | POA: Diagnosis not present

## 2019-12-04 DIAGNOSIS — M5431 Sciatica, right side: Secondary | ICD-10-CM | POA: Diagnosis not present

## 2019-12-04 DIAGNOSIS — M5432 Sciatica, left side: Secondary | ICD-10-CM | POA: Diagnosis not present

## 2019-12-07 DIAGNOSIS — M1711 Unilateral primary osteoarthritis, right knee: Secondary | ICD-10-CM | POA: Diagnosis not present

## 2019-12-07 DIAGNOSIS — M25461 Effusion, right knee: Secondary | ICD-10-CM | POA: Diagnosis not present

## 2019-12-19 DIAGNOSIS — M25561 Pain in right knee: Secondary | ICD-10-CM | POA: Diagnosis not present

## 2019-12-19 DIAGNOSIS — M7051 Other bursitis of knee, right knee: Secondary | ICD-10-CM | POA: Diagnosis not present

## 2019-12-19 DIAGNOSIS — M1711 Unilateral primary osteoarthritis, right knee: Secondary | ICD-10-CM | POA: Diagnosis not present

## 2020-03-09 DIAGNOSIS — F411 Generalized anxiety disorder: Secondary | ICD-10-CM | POA: Diagnosis not present

## 2020-03-11 DIAGNOSIS — M7051 Other bursitis of knee, right knee: Secondary | ICD-10-CM | POA: Diagnosis not present

## 2020-03-11 DIAGNOSIS — M1711 Unilateral primary osteoarthritis, right knee: Secondary | ICD-10-CM | POA: Diagnosis not present

## 2020-03-11 DIAGNOSIS — M170 Bilateral primary osteoarthritis of knee: Secondary | ICD-10-CM | POA: Diagnosis not present

## 2020-03-11 DIAGNOSIS — M1712 Unilateral primary osteoarthritis, left knee: Secondary | ICD-10-CM | POA: Diagnosis not present

## 2020-03-12 DIAGNOSIS — E559 Vitamin D deficiency, unspecified: Secondary | ICD-10-CM | POA: Diagnosis not present

## 2020-03-12 DIAGNOSIS — D649 Anemia, unspecified: Secondary | ICD-10-CM | POA: Diagnosis not present

## 2020-03-26 DIAGNOSIS — H905 Unspecified sensorineural hearing loss: Secondary | ICD-10-CM | POA: Diagnosis not present

## 2020-03-26 DIAGNOSIS — Z79899 Other long term (current) drug therapy: Secondary | ICD-10-CM | POA: Diagnosis not present

## 2020-03-26 DIAGNOSIS — H6123 Impacted cerumen, bilateral: Secondary | ICD-10-CM | POA: Diagnosis not present

## 2020-03-26 DIAGNOSIS — H90A21 Sensorineural hearing loss, unilateral, right ear, with restricted hearing on the contralateral side: Secondary | ICD-10-CM | POA: Diagnosis not present

## 2020-03-26 DIAGNOSIS — H608X1 Other otitis externa, right ear: Secondary | ICD-10-CM | POA: Diagnosis not present

## 2020-03-26 DIAGNOSIS — Z974 Presence of external hearing-aid: Secondary | ICD-10-CM | POA: Diagnosis not present

## 2020-04-01 DIAGNOSIS — N3946 Mixed incontinence: Secondary | ICD-10-CM | POA: Diagnosis not present

## 2020-04-02 DIAGNOSIS — H00015 Hordeolum externum left lower eyelid: Secondary | ICD-10-CM | POA: Diagnosis not present

## 2020-04-08 DIAGNOSIS — M1711 Unilateral primary osteoarthritis, right knee: Secondary | ICD-10-CM | POA: Diagnosis not present

## 2020-04-09 DIAGNOSIS — D2271 Melanocytic nevi of right lower limb, including hip: Secondary | ICD-10-CM | POA: Diagnosis not present

## 2020-04-09 DIAGNOSIS — D225 Melanocytic nevi of trunk: Secondary | ICD-10-CM | POA: Diagnosis not present

## 2020-04-09 DIAGNOSIS — D2272 Melanocytic nevi of left lower limb, including hip: Secondary | ICD-10-CM | POA: Diagnosis not present

## 2020-04-09 DIAGNOSIS — L0109 Other impetigo: Secondary | ICD-10-CM | POA: Diagnosis not present

## 2020-04-09 DIAGNOSIS — L720 Epidermal cyst: Secondary | ICD-10-CM | POA: Diagnosis not present

## 2020-04-09 DIAGNOSIS — L821 Other seborrheic keratosis: Secondary | ICD-10-CM | POA: Diagnosis not present

## 2020-04-09 DIAGNOSIS — L814 Other melanin hyperpigmentation: Secondary | ICD-10-CM | POA: Diagnosis not present

## 2020-04-09 DIAGNOSIS — D1801 Hemangioma of skin and subcutaneous tissue: Secondary | ICD-10-CM | POA: Diagnosis not present

## 2020-04-15 DIAGNOSIS — M1711 Unilateral primary osteoarthritis, right knee: Secondary | ICD-10-CM | POA: Diagnosis not present

## 2020-04-22 DIAGNOSIS — M1711 Unilateral primary osteoarthritis, right knee: Secondary | ICD-10-CM | POA: Diagnosis not present

## 2020-04-26 DIAGNOSIS — M1711 Unilateral primary osteoarthritis, right knee: Secondary | ICD-10-CM | POA: Diagnosis not present

## 2020-04-29 DIAGNOSIS — M1711 Unilateral primary osteoarthritis, right knee: Secondary | ICD-10-CM | POA: Diagnosis not present

## 2020-05-05 DIAGNOSIS — Z01419 Encounter for gynecological examination (general) (routine) without abnormal findings: Secondary | ICD-10-CM | POA: Diagnosis not present

## 2020-05-05 DIAGNOSIS — Z79890 Hormone replacement therapy (postmenopausal): Secondary | ICD-10-CM | POA: Diagnosis not present

## 2020-05-05 DIAGNOSIS — Z124 Encounter for screening for malignant neoplasm of cervix: Secondary | ICD-10-CM | POA: Diagnosis not present

## 2020-05-05 DIAGNOSIS — N952 Postmenopausal atrophic vaginitis: Secondary | ICD-10-CM | POA: Diagnosis not present

## 2020-05-12 DIAGNOSIS — D649 Anemia, unspecified: Secondary | ICD-10-CM | POA: Diagnosis not present

## 2020-05-14 DIAGNOSIS — Z23 Encounter for immunization: Secondary | ICD-10-CM | POA: Diagnosis not present

## 2020-05-26 DIAGNOSIS — R42 Dizziness and giddiness: Secondary | ICD-10-CM | POA: Diagnosis not present

## 2020-05-26 DIAGNOSIS — I340 Nonrheumatic mitral (valve) insufficiency: Secondary | ICD-10-CM | POA: Diagnosis not present

## 2020-05-26 DIAGNOSIS — R002 Palpitations: Secondary | ICD-10-CM | POA: Diagnosis not present

## 2020-05-26 DIAGNOSIS — R5383 Other fatigue: Secondary | ICD-10-CM | POA: Diagnosis not present

## 2020-05-26 DIAGNOSIS — R55 Syncope and collapse: Secondary | ICD-10-CM | POA: Diagnosis not present

## 2020-05-27 ENCOUNTER — Telehealth: Payer: Self-pay

## 2020-05-27 NOTE — Telephone Encounter (Signed)
Notes on file from [place] referral to scheduling

## 2020-06-03 DIAGNOSIS — M1711 Unilateral primary osteoarthritis, right knee: Secondary | ICD-10-CM | POA: Diagnosis not present

## 2020-06-05 DIAGNOSIS — Z23 Encounter for immunization: Secondary | ICD-10-CM | POA: Diagnosis not present

## 2020-06-14 DIAGNOSIS — M154 Erosive (osteo)arthritis: Secondary | ICD-10-CM | POA: Diagnosis not present

## 2020-06-14 DIAGNOSIS — M8589 Other specified disorders of bone density and structure, multiple sites: Secondary | ICD-10-CM | POA: Diagnosis not present

## 2020-06-14 DIAGNOSIS — M1120 Other chondrocalcinosis, unspecified site: Secondary | ICD-10-CM | POA: Diagnosis not present

## 2020-06-14 DIAGNOSIS — M170 Bilateral primary osteoarthritis of knee: Secondary | ICD-10-CM | POA: Diagnosis not present

## 2020-06-23 DIAGNOSIS — Z1159 Encounter for screening for other viral diseases: Secondary | ICD-10-CM | POA: Diagnosis not present

## 2020-06-28 DIAGNOSIS — Z86010 Personal history of colonic polyps: Secondary | ICD-10-CM | POA: Diagnosis not present

## 2020-06-28 DIAGNOSIS — K5730 Diverticulosis of large intestine without perforation or abscess without bleeding: Secondary | ICD-10-CM | POA: Diagnosis not present

## 2020-07-12 NOTE — Progress Notes (Signed)
Cardiology Office Note:    Date:  07/15/2020   ID:  Haley Wade, DOB 02/28/1946, MRN [id number]  PCP:  Shon Baton, MD  Cardiologist:  Sinclair Grooms, MD   Referring MD: Candie Echevaria*   Chief Complaint  Patient presents with  . Irregular Heart Beat  . Dizziness    History of Present Illness:    Haley Wade is a 74 y.o. female with a hx of sensorineural hearing loss,  IRBBB, GERD, mitral regurgitation, family h/o cancer, who presents with palpitations, SVT and syncope. Has h/o epigastric pain.  1 month ago, Haley Wade had lost her balance and fell.  Luckily she was inside her house on carpet.  She did receive injury.  There was a question as to whether or not she had true syncope.  She states there is a prior history of vertigo.  Since that time she has had lightheadedness and some difficulty with balance.  Evaluation is not included a neurological assessment.  She denies chest pain, dyspnea, orthopnea, and swelling.  She has noted palpitations that sound like isolated premature beats.  She has not had any sustained tachycardia lasting more than seconds.  She also states that at night she occasionally has a sensation that her heart is beating hard.  Past Medical History:  Diagnosis Date  . Allergic rhinitis, cause unspecified   . Cellulitis   . Chest discomfort   . COPD, questioned   . Hyponatremia   . MITRAL VALVE PROLAPSE, HX OF   . Other chronic sinusitis   . Palpitations   . Syncope   . Unspecified hearing loss     Past Surgical History:  Procedure Laterality Date  . BLADDER SURGERY    . BOWEL RESECTION N/A 11/01/2012   Procedure: SMALL BOWEL RESECTION;  Surgeon: Earnstine Regal, MD;  Location: [facility];  Service: General;  Laterality: N/A;  . BREAST ENHANCEMENT SURGERY     revisions  . ESOPHAGOGASTRODUODENOSCOPY (EGD) WITH PROPOFOL N/A 10/14/2019   Procedure: ESOPHAGOGASTRODUODENOSCOPY (EGD) WITH PROPOFOL;  Surgeon: Clarene Essex, MD;  Location: [facility];  Service: Endoscopy;  Laterality: N/A;  . GI RADIOFREQUENCY ABLATION  10/14/2019   Procedure: GI RADIOFREQUENCY ABLATION;  Surgeon: Clarene Essex, MD;  Location: [facility];  Service: Endoscopy;;  . LAPAROSCOPY N/A 11/01/2012   Procedure: LAPAROSCOPY DIAGNOSTIC;  Surgeon: Earnstine Regal, MD;  Location: [facility];  Service: General;  Laterality: N/A;  . LAPAROTOMY N/A 11/01/2012   Procedure: EXPLORATORY LAPAROTOMY;  Surgeon: Earnstine Regal, MD;  Location: [facility];  Service: General;  Laterality: N/A;  . LYSIS OF ADHESION N/A 11/01/2012   Procedure: LYSIS OF ADHESION;  Surgeon: Earnstine Regal, MD;  Location: [facility];  Service: General;  Laterality: N/A;  . RHINOPLASTY    . TONSILLECTOMY    . TOTAL ABDOMINAL HYSTERECTOMY      Current Medications: Current Meds  Medication Sig  . conjugated estrogens (PREMARIN) vaginal cream Place 0.5 g vaginally 2 (two) times a week. Patient states that uses this just whenever she remembers during the week no specific days to list.  . estrogens, conjugated, (PREMARIN) 0.625 MG tablet Take 0.625 mg by mouth daily.   . finasteride (PROSCAR) 5 MG tablet Take 5 mg by mouth daily.  . fluticasone (FLONASE) 50 MCG/ACT nasal spray Place 2 sprays into both nostrils daily as needed for allergies or rhinitis.  Marland Kitchen iron polysaccharides (NIFEREX) 150 MG capsule Take 150 mg by mouth daily.  Marland Kitchen LORazepam (ATIVAN)  0.5 MG tablet Take 0.25 tablets by mouth in the morning and at bedtime.   [place] Pam Bl-Phen Sal (URISED PO) Take 1 tablet by mouth daily as needed (for bladder spasms).  Marland Kitchen omeprazole (PRILOSEC OTC) 20 MG tablet Take 20 mg by mouth daily as needed (acid reflux/indigestion.).   Marland Kitchen Polyethyl Glycol-Propyl Glycol (SYSTANE ULTRA) 0.4-0.3 % SOLN Place 1 drop into both eyes 3 (three) times daily as needed (dry/irritated eyes.).  Marland Kitchen spironolactone (ALDACTONE) 50 MG tablet Take 1 tablet (50 mg total) by mouth daily.  . Vitamin D, Ergocalciferol, (DRISDOL) 1.25 MG (50000  UNIT) CAPS capsule Take 50,000 Units by mouth every Saturday.  Marland Kitchen ZOVIRAX 200 MG capsule Take 200 mg by mouth daily.      Allergies:   Bee venom and Terconazole   Social History   Socioeconomic History  . Marital status: Divorced    Spouse name: Not on file  . Number of children: 1  . Years of education: Not on file  . Highest education level: Not on file  Occupational History  . 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 Smoking status: Former Smoker    Types: Cigarettes    Quit date: 08/14/1980    Years since quitting: 39.9  . Smokeless tobacco: Never Used  Substance and Sexual Activity  . Alcohol use: No  . Drug use: No  . Sexual activity: Not on file  Other Topics Concern  . Not on file  Social History Narrative  . Not on file   Social Determinants of Health   Financial Resource Strain:   . Difficulty of Paying Living Expenses: Not on file  Food Insecurity:   . Worried About Charity fundraiser in the Last Year: Not on file  . Ran Out of Food in the Last Year: Not on file  Transportation Needs:   . Lack of Transportation (Medical): Not on file  . Lack of Transportation (Non-Medical): Not on file  Physical Activity:   . Days of Exercise per Week: Not on file  . Minutes of Exercise per Session: Not on file  Stress:   . Feeling of Stress : Not on file  Social Connections:   . Frequency of Communication with Friends and Family: Not on file  . Frequency of Social Gatherings with Friends and Family: Not on file  . Attends Religious Services: Not on file  . Active Member of Clubs or Organizations: Not on file  . Attends Archivist Meetings: Not on file  . Marital Status: Not on file     Family History: The patient's family history includes Asthma in an other family member; Liver cancer in her brother; Liver disease in her brother; Lung cancer in her father; Other in her mother and another family member.  ROS:     Please see the history of present illness.    Concerned about the possibility of COPD and states that I did a chest x-ray on her years ago that raised a question about the possibility of that diagnosis.  She carries a diagnosis of GAVE (gastric antral vascular ectasia) she is a prior smoker (very low exposure).  All other systems reviewed and are negative.  EKGs/Labs/Other Studies Reviewed:    The following studies were reviewed today: No recent cardiac studies  EKG:  EKG normal sinus rhythm, incomplete right bundle, left anterior hemiblock.  Poor R wave progression.  Recent Labs: No results found for requested labs within last 8760  hours.  Recent Lipid Panel No results found for: CHOL, TRIG, HDL, CHOLHDL, VLDL, LDLCALC, LDLDIRECT  Physical Exam:    VS:  BP 140/82   Pulse 81   Ht 4\' 11"  (1.499 m)   Wt 97 lb 3.2 oz (44.1 kg)   SpO2 98%   BMI 19.63 kg/m     Wt Readings from Last 3 Encounters:  07/15/20 97 lb 3.2 oz (44.1 kg)  10/14/19 97 lb 10.6 oz (44.3 kg)  01/21/15 101 lb (45.8 kg)     GEN: Slender. No acute distress HEENT: Normal NECK: No JVD. LYMPHATICS: No lymphadenopathy CARDIAC:  RRR without murmur, gallop, or edema. VASCULAR:  Normal Pulses. No bruits. RESPIRATORY:  Clear to auscultation without rales, wheezing or rhonchi  ABDOMEN: Soft, non-tender, non-distended, No pulsatile mass, MUSCULOSKELETAL: No deformity  SKIN: Warm and dry NEUROLOGIC:  Alert and oriented x 3 PSYCHIATRIC:  Normal affect   ASSESSMENT:    1. Syncope and collapse   2. SVT (supraventricular tachycardia) (HCC)   3. Chest discomfort   4. Palpitations   5. Mitral valve disease   6. Educated about COVID-19 virus infection    PLAN:    In order of problems listed above:  1. Uncertain if she had syncope.  Sounds as though this could have been a recurrence of vertigo that she has had over years.  In presence of complaints of palpitations, 30-day monitor will be in order to exclude  unexpected bradycardia or tacky arrhythmias that would suggest a cardiac etiology. 2. No sustained episodes. 3. Chest discomfort "heavy heartbeat" is related to palpitations and not to concerns of ischemic heart disease. 4. See #1 above.  30-day monitor will be done to exclude atrial fib and or bradycardia arrhythmias. 5. No auscultatory evidence of significant mitral valve disease. 6. Vaccinated and boosted.  Otherwise clinical observation unless actionable arrhythmias are noted on 30-day monitoring.  Consider neurological/ENT evaluation for vertigo.   Medication Adjustments/Labs and Tests Ordered: Current medicines are reviewed at length with the patient today.  Concerns regarding medicines are outlined above.  Orders Placed This Encounter  Procedures  . Cardiac event monitor  . EKG 12-Lead   No orders of the defined types were placed in this encounter.   Patient Instructions  Medication Instructions:  Your physician recommends that you continue on your current medications as directed. Please refer to the Current Medication list given to you today.  *If you need a refill on your cardiac medications before your next appointment, please call your pharmacy*   Lab Work: None If you have labs (blood work) drawn today and your tests are completely normal, you will receive your results only by: Marland Kitchen MyChart Message (if you have MyChart) OR . A paper copy in the mail If you have any lab test that is abnormal or we need to change your treatment, we will call you to review the results.   Testing/Procedures: Your physician has recommended that you wear an event monitor. Event monitors are medical devices that record the heart's electrical activity. Doctors most often [place] these monitors to diagnose arrhythmias. Arrhythmias are problems with the speed or rhythm of the heartbeat. The monitor is a small, portable device. You can wear one while you do your normal daily activities. This is usually  used to diagnose what is causing palpitations/syncope (passing out).   Follow-Up: At [facility], you and your health needs are our priority.  As part of our continuing mission to provide you with exceptional heart care,  we have created designated Provider Care Teams.  These Care Teams include your primary Cardiologist (physician) and Advanced Practice Providers (APPs -  Physician Assistants and Nurse Practitioners) who all work together to provide you with the care you need, when you need it.  We recommend signing up for the patient portal called "MyChart".  Sign up information is provided on this After Visit Summary.  MyChart is used to connect with patients for Virtual Visits (Telemedicine).  Patients are able to view lab/test results, encounter notes, upcoming appointments, etc.  Non-urgent messages can be sent to your provider as well.   To learn more about what you can do with MyChart, go to [email or web address].    Your next appointment:   As needed  The format for your next appointment:   In Person  Provider:   You may see Sinclair Grooms, MD or one of the following Advanced Practice Providers on your designated Care Team:    Truitt Merle, NP  Cecilie Kicks, NP  Kathyrn Drown, NP    Other Instructions  [employer] Cardiac Event Monitor Instructions Your physician has requested you wear your cardiac event monitor for 30 days. [employer] may call or text to confirm a shipping address. The monitor will be sent to a land address via UPS. [employer] will not ship a monitor to a PO BOX. It typically takes 3-5 days to receive your monitor after it has been enrolled. [employer] will assist with USPS tracking if your package is delayed. The telephone number for [employer] is [phone]. Once you have received your monitor, please review the enclosed instructions. Instruction tutorials can also be viewed under help and settings on the enclosed cell phone. Your monitor has  already been registered assigning a specific monitor serial # to you.  Applying the monitor Remove cell phone from case and turn it on. The cell phone works as Dealer and needs to be within [employer] of you at all times. The cell phone will need to be charged on a daily basis. We recommend you plug the cell phone into the enclosed charger at your bedside table every night.  Monitor batteries: You will receive two monitor batteries labelled #1 and #2. These are your recorders. Plug battery #2 onto the second connection on the enclosed charger. Keep one battery on the charger at all times. This will keep the monitor battery deactivated. It will also keep it fully charged for when you need to switch your monitor batteries. A small light will be blinking on the battery emblem when it is charging. The light on the battery emblem will remain on when the battery is fully charged.  Open package of a Monitor strip. Insert battery #1 into black hood on strip and gently squeeze monitor battery onto connection as indicated in instruction booklet. Set aside while preparing skin.  Choose location for your strip, vertical or horizontal, as indicated in the instruction booklet. Shave to remove all hair from location. There cannot be any lotions, oils, powders, or colognes on skin where monitor is to be applied. Wipe skin clean with enclosed Saline wipe. Dry skin completely.  Peel paper labeled #1 off the back of the Monitor strip exposing the adhesive. Place the monitor on the chest in the vertical or horizontal position shown in the instruction booklet. One arrow on the monitor strip must be pointing upward. Carefully remove paper labeled #2, attaching remainder of strip to your skin. Try not to create any folds or wrinkles in the  strip as you apply it.  Firmly press and release the circle in the center of the monitor battery. You will hear a small beep. This is turning the monitor battery on.  The heart emblem on the monitor battery will light up every 5 seconds if the monitor battery in turned on and connected to the patient securely. Do not push and hold the circle down as this turns the monitor battery off. The cell phone will locate the monitor battery. A screen will appear on the cell phone checking the connection of your monitor strip. This may read poor connection initially but change to good connection within the next minute. Once your monitor accepts the connection you will hear a series of 3 beeps followed by a climbing crescendo of beeps. A screen will appear on the cell phone showing the two monitor strip placement options. Touch the picture that demonstrates where you applied the monitor strip.  Your monitor strip and battery are waterproof. You are able to shower, bathe, or swim with the monitor on. They just ask you do not submerge deeper than 3 feet underwater. We recommend removing the monitor if you are swimming in a lake, river, or ocean.  Your monitor battery will need to be switched to a fully charged monitor battery approximately once a week. The cell phone will alert you of an action which needs to be made.  On the cell phone, tap for details to reveal connection status, monitor battery status, and cell phone battery status. The green dots indicates your monitor is in good status. A red dot indicates there is something that needs your attention.  To record a symptom, click the circle on the monitor battery. In 30-60 seconds a list of symptoms will appear on the cell phone. Select your symptom and tap save. Your monitor will record a sustained or significant arrhythmia regardless of you clicking the button. Some patients do not feel the heart rhythm irregularities. [employer] will notify us of any serious or critical events.  Refer to instruction booklet for instructions on switching batteries, changing strips, the Do not disturb or Pause features, or any  additional questions.  Call [employer] at [phone], to confirm your monitor is transmitting and record your baseline. They will answer any questions you may have regarding the monitor instructions at that time.  Returning the monitor to [place] all equipment back into blue box. Peel off strip of paper to expose adhesive and close box securely. There is a prepaid UPS shipping label on this box. Drop in a UPS drop box, or at a UPS facility like [employer]. You may also contact [employer] to arrange UPS to pick up monitor package at your home.      Signed, Sinclair Grooms, MD  07/15/2020 1:00 PM    [place]

## 2020-07-15 ENCOUNTER — Ambulatory Visit (INDEPENDENT_AMBULATORY_CARE_PROVIDER_SITE_OTHER): Payer: Medicare Other | Admitting: Interventional Cardiology

## 2020-07-15 ENCOUNTER — Telehealth: Payer: Self-pay | Admitting: Radiology

## 2020-07-15 ENCOUNTER — Other Ambulatory Visit: Payer: Self-pay

## 2020-07-15 ENCOUNTER — Encounter: Payer: Self-pay | Admitting: Interventional Cardiology

## 2020-07-15 VITALS — BP 140/82 | HR 81 | Ht 59.0 in | Wt 97.2 lb

## 2020-07-15 DIAGNOSIS — R0789 Other chest pain: Secondary | ICD-10-CM

## 2020-07-15 DIAGNOSIS — Z7189 Other specified counseling: Secondary | ICD-10-CM

## 2020-07-15 DIAGNOSIS — R55 Syncope and collapse: Secondary | ICD-10-CM

## 2020-07-15 DIAGNOSIS — I471 Supraventricular tachycardia: Secondary | ICD-10-CM | POA: Diagnosis not present

## 2020-07-15 DIAGNOSIS — I059 Rheumatic mitral valve disease, unspecified: Secondary | ICD-10-CM | POA: Diagnosis not present

## 2020-07-15 DIAGNOSIS — R002 Palpitations: Secondary | ICD-10-CM

## 2020-07-15 NOTE — Patient Instructions (Signed)
Medication Instructions:  Your physician recommends that you continue on your current medications as directed. Please refer to the Current Medication list given to you today.  *If you need a refill on your cardiac medications before your next appointment, please call your pharmacy*   Lab Work: None If you have labs (blood work) drawn today and your tests are completely normal, you will receive your results only by: Marland Kitchen MyChart Message (if you have MyChart) OR . A paper copy in the mail If you have any lab test that is abnormal or we need to change your treatment, we will call you to review the results.   Testing/Procedures: Your physician has recommended that you wear an event monitor. Event monitors are medical devices that record the heart's electrical activity. Doctors most often [place] these monitors to diagnose arrhythmias. Arrhythmias are problems with the speed or rhythm of the heartbeat. The monitor is a small, portable device. You can wear one while you do your normal daily activities. This is usually used to diagnose what is causing palpitations/syncope (passing out).   Follow-Up: At [facility], you and your health needs are our priority.  As part of our continuing mission to provide you with exceptional heart care, we have created designated Provider Care Teams.  These Care Teams include your primary Cardiologist (physician) and Advanced Practice Providers (APPs -  Physician Assistants and Nurse Practitioners) who all work together to provide you with the care you need, when you need it.  We recommend signing up for the patient portal called "MyChart".  Sign up information is provided on this After Visit Summary.  MyChart is used to connect with patients for Virtual Visits (Telemedicine).  Patients are able to view lab/test results, encounter notes, upcoming appointments, etc.  Non-urgent messages can be sent to your provider as well.   To learn more about what you can do with MyChart,  go to [email or web address].    Your next appointment:   As needed  The format for your next appointment:   In Person  Provider:   You may see Sinclair Grooms, MD or one of the following Advanced Practice Providers on your designated Care Team:    Truitt Merle, NP  Cecilie Kicks, NP  Kathyrn Drown, NP    Other Instructions  [employer] Cardiac Event Monitor Instructions Your physician has requested you wear your cardiac event monitor for 30 days. [employer] may call or text to confirm a shipping address. The monitor will be sent to a land address via UPS. [employer] will not ship a monitor to a PO BOX. It typically takes 3-5 days to receive your monitor after it has been enrolled. [employer] will assist with USPS tracking if your package is delayed. The telephone number for [employer] is [phone]. Once you have received your monitor, please review the enclosed instructions. Instruction tutorials can also be viewed under help and settings on the enclosed cell phone. Your monitor has already been registered assigning a specific monitor serial # to you.  Applying the monitor Remove cell phone from case and turn it on. The cell phone works as Dealer and needs to be within [employer] of you at all times. The cell phone will need to be charged on a daily basis. We recommend you plug the cell phone into the enclosed charger at your bedside table every night.  Monitor batteries: You will receive two monitor batteries labelled #1 and #2. These are your recorders. Plug battery #2 onto the second  connection on the enclosed charger. Keep one battery on the charger at all times. This will keep the monitor battery deactivated. It will also keep it fully charged for when you need to switch your monitor batteries. A small light will be blinking on the battery emblem when it is charging. The light on the battery emblem will remain on when the battery is fully charged.  Open  package of a Monitor strip. Insert battery #1 into black hood on strip and gently squeeze monitor battery onto connection as indicated in instruction booklet. Set aside while preparing skin.  Choose location for your strip, vertical or horizontal, as indicated in the instruction booklet. Shave to remove all hair from location. There cannot be any lotions, oils, powders, or colognes on skin where monitor is to be applied. Wipe skin clean with enclosed Saline wipe. Dry skin completely.  Peel paper labeled #1 off the back of the Monitor strip exposing the adhesive. Place the monitor on the chest in the vertical or horizontal position shown in the instruction booklet. One arrow on the monitor strip must be pointing upward. Carefully remove paper labeled #2, attaching remainder of strip to your skin. Try not to create any folds or wrinkles in the strip as you apply it.  Firmly press and release the circle in the center of the monitor battery. You will hear a small beep. This is turning the monitor battery on. The heart emblem on the monitor battery will light up every 5 seconds if the monitor battery in turned on and connected to the patient securely. Do not push and hold the circle down as this turns the monitor battery off. The cell phone will locate the monitor battery. A screen will appear on the cell phone checking the connection of your monitor strip. This may read poor connection initially but change to good connection within the next minute. Once your monitor accepts the connection you will hear a series of 3 beeps followed by a climbing crescendo of beeps. A screen will appear on the cell phone showing the two monitor strip placement options. Touch the picture that demonstrates where you applied the monitor strip.  Your monitor strip and battery are waterproof. You are able to shower, bathe, or swim with the monitor on. They just ask you do not submerge deeper than 3 feet underwater. We  recommend removing the monitor if you are swimming in a lake, river, or ocean.  Your monitor battery will need to be switched to a fully charged monitor battery approximately once a week. The cell phone will alert you of an action which needs to be made.  On the cell phone, tap for details to reveal connection status, monitor battery status, and cell phone battery status. The green dots indicates your monitor is in good status. A red dot indicates there is something that needs your attention.  To record a symptom, click the circle on the monitor battery. In 30-60 seconds a list of symptoms will appear on the cell phone. Select your symptom and tap save. Your monitor will record a sustained or significant arrhythmia regardless of you clicking the button. Some patients do not feel the heart rhythm irregularities. [employer] will notify us of any serious or critical events.  Refer to instruction booklet for instructions on switching batteries, changing strips, the Do not disturb or Pause features, or any additional questions.  Call [employer] at [phone], to confirm your monitor is transmitting and record your baseline. They will answer any questions  you may have regarding the monitor instructions at that time.  Returning the monitor to [place] all equipment back into blue box. Peel off strip of paper to expose adhesive and close box securely. There is a prepaid UPS shipping label on this box. Drop in a UPS drop box, or at a UPS facility like [employer]. You may also contact [employer] to arrange UPS to pick up monitor package at your home.

## 2020-07-15 NOTE — Telephone Encounter (Signed)
Enrolled patient for a 30 day [employer] Event Monitor to be mailed to patients home  

## 2020-07-19 DIAGNOSIS — F411 Generalized anxiety disorder: Secondary | ICD-10-CM | POA: Diagnosis not present

## 2020-07-21 ENCOUNTER — Telehealth: Payer: Self-pay | Admitting: Interventional Cardiology

## 2020-07-21 NOTE — Telephone Encounter (Signed)
Patient states she would like to know if she can have an appointment to have her heart monitor put on.

## 2020-07-21 NOTE — Telephone Encounter (Signed)
Patient scheduled Monday, 07/26/2020, 12:00PM, to have cardiac event monitor applied and to be given tutorial. Monitor has been enrolled and shipped to patient by [employer] 07/15/2020.

## 2020-07-26 ENCOUNTER — Ambulatory Visit (INDEPENDENT_AMBULATORY_CARE_PROVIDER_SITE_OTHER): Payer: Medicare Other

## 2020-07-26 ENCOUNTER — Other Ambulatory Visit: Payer: Self-pay

## 2020-07-26 DIAGNOSIS — I471 Supraventricular tachycardia, unspecified: Secondary | ICD-10-CM

## 2020-07-26 DIAGNOSIS — R55 Syncope and collapse: Secondary | ICD-10-CM

## 2020-07-30 ENCOUNTER — Telehealth: Payer: Self-pay | Admitting: Interventional Cardiology

## 2020-07-30 NOTE — Telephone Encounter (Signed)
Patient states last night her monitor needed to be charged, but she could not get the patch off. She states she had to use goo goo gone to get it off and it took 40 minutes. She states she has not had it on today and that the skin still has some redness from the monitor.

## 2020-08-02 NOTE — Telephone Encounter (Signed)
[employer] will be notified to ship patient base and pediatric electrodes.

## 2020-09-01 DIAGNOSIS — H52203 Unspecified astigmatism, bilateral: Secondary | ICD-10-CM | POA: Diagnosis not present

## 2020-09-01 DIAGNOSIS — H25813 Combined forms of age-related cataract, bilateral: Secondary | ICD-10-CM | POA: Diagnosis not present

## 2020-09-09 DIAGNOSIS — K5900 Constipation, unspecified: Secondary | ICD-10-CM | POA: Diagnosis not present

## 2020-09-09 DIAGNOSIS — R109 Unspecified abdominal pain: Secondary | ICD-10-CM | POA: Diagnosis not present

## 2020-09-13 DIAGNOSIS — D649 Anemia, unspecified: Secondary | ICD-10-CM | POA: Diagnosis not present

## 2020-09-17 DIAGNOSIS — K5900 Constipation, unspecified: Secondary | ICD-10-CM | POA: Diagnosis not present

## 2020-09-27 ENCOUNTER — Telehealth: Payer: Self-pay | Admitting: *Deleted

## 2020-09-27 DIAGNOSIS — R079 Chest pain, unspecified: Secondary | ICD-10-CM

## 2020-09-27 NOTE — Telephone Encounter (Signed)
-----   Message from Belva Crome, MD sent at 09/27/2020 11:42 AM EST ----- Let the patient know there are premature ventricular beats. She needs stress or Lexiscan Myoview perfusion study to r/o CAD A copy will be sent to Shon Baton, MD

## 2020-09-27 NOTE — Telephone Encounter (Signed)
Informed pt of results. Pt verbalized understanding. 

## 2020-10-08 NOTE — Addendum Note (Signed)
Addended by: Loren Racer on: 10/08/2020 12:14 PM   Modules accepted: Orders

## 2020-10-13 NOTE — Addendum Note (Signed)
Addended by: Belva Crome on: 10/13/2020 03:32 PM   Modules accepted: Orders

## 2020-10-13 NOTE — Telephone Encounter (Signed)
Done. How was the order embedded like that? It saves time.

## 2020-10-14 DIAGNOSIS — M170 Bilateral primary osteoarthritis of knee: Secondary | ICD-10-CM | POA: Diagnosis not present

## 2020-10-18 ENCOUNTER — Ambulatory Visit (INDEPENDENT_AMBULATORY_CARE_PROVIDER_SITE_OTHER): Payer: Medicare Other | Admitting: Psychiatry

## 2020-10-18 ENCOUNTER — Other Ambulatory Visit: Payer: Self-pay

## 2020-10-18 ENCOUNTER — Encounter: Payer: Self-pay | Admitting: Psychiatry

## 2020-10-18 VITALS — BP 139/81 | HR 78 | Ht 59.5 in | Wt 96.0 lb

## 2020-10-18 DIAGNOSIS — F5105 Insomnia due to other mental disorder: Secondary | ICD-10-CM

## 2020-10-18 DIAGNOSIS — F411 Generalized anxiety disorder: Secondary | ICD-10-CM | POA: Diagnosis not present

## 2020-10-18 MED ORDER — ZALEPLON 5 MG PO CAPS: 5.0000 mg | ORAL_CAPSULE | Freq: Every evening | ORAL | 5 refills | Status: DC | PRN

## 2020-10-18 MED ORDER — LORAZEPAM 0.5 MG PO TABS: 0.2500 mg | ORAL_TABLET | Freq: Two times a day (BID) | ORAL | 5 refills | Status: DC

## 2020-10-18 NOTE — Progress Notes (Signed)
[facility] MD/PA/NP Initial Note  10/18/2020 3:17 PM Haley Wade  MRN:  [id number]  Chief Complaint:  Chief Complaint    Anxiety; Initial Visit      HPI:   Pt wants psychiatric follow up.    Moved from [place] to [place].  D and family moved to [facility].  Originally from here and came back.   Friends Butch Penny and Ron R referred her here.  Had to stay with mutual friends for awhile and it was difficult bc was a controlling person without a filter.  Stayed there for 6 days in August until her condo was ready.   Problems with the woman, Vaughan Basta,  over her dog Adrian Blackwater and woman repeatedly saying that "you can change". Now cannot let go of this.  LT friends with Butch Penny and this has caused problems in the relationship with Butch Penny.   Don't want to lose that friendship.  Wants a new therapist.  Very active.   She's doing a lot of volunteer work and happy to have moved back here.  Pleased with new condo.  [facility] [employer]  Walks daily.  Has a lot of friends here.  Takes lorazepam as needed and usually 0.25 mg daily.  Only takes Zaleplon as needed also about once weekly.  Both low doses. Seeing therapist once every 6 mos. Patient reports stable mood and denies depressed or irritable moods.  Patient denies any recent difficulty with anxiety.  Patient denies difficulty with sleep initiation or maintenance but some awakening and usually 8 hours. Denies appetite disturbance.  Weight stable. Patient reports that energy and motivation have been good.  Patient denies any difficulty with concentration.  Patient denies any suicidal ideation.  No history of D&A.  Asks for therapist.  Visit Diagnosis:    ICD-10-CM   1. 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 On lorazepam for years. Alprazolam short  duration.  Klonopin SE. History of more psych problems during divorce.  Past Medical History:  PCP [place] Past Medical History:  Diagnosis Date  . Allergic rhinitis, cause unspecified   . Cellulitis   . Chest discomfort   . COPD, questioned   . GAVE (gastric antral vascular ectasia)   . Hyponatremia   . MITRAL VALVE PROLAPSE, HX OF   . Other chronic sinusitis   . Palpitations   . Syncope   . Unspecified hearing loss     Past Surgical History:  Procedure Laterality Date  . BLADDER SURGERY    . BOWEL RESECTION N/A 11/01/2012   Procedure: SMALL BOWEL RESECTION;  Surgeon: Earnstine Regal, MD;  Location: [facility];  Service: General;  Laterality: N/A;  . BREAST ENHANCEMENT SURGERY     revisions  . ESOPHAGOGASTRODUODENOSCOPY (EGD) WITH PROPOFOL N/A 10/14/2019   Procedure: ESOPHAGOGASTRODUODENOSCOPY (EGD) WITH PROPOFOL;  Surgeon: Clarene Essex, MD;  Location: [facility];  Service: Endoscopy;  Laterality: N/A;  . GI RADIOFREQUENCY ABLATION  10/14/2019   Procedure: GI RADIOFREQUENCY ABLATION;  Surgeon: Clarene Essex, MD;  Location: [facility];  Service: Endoscopy;;  . LAPAROSCOPY N/A 11/01/2012   Procedure: LAPAROSCOPY DIAGNOSTIC;  Surgeon: Earnstine Regal, MD;  Location: [facility];  Service: General;  Laterality: N/A;  . LAPAROTOMY N/A 11/01/2012   Procedure: EXPLORATORY LAPAROTOMY;  Surgeon: Earnstine Regal, MD;  Location: [facility];  Service: General;  Laterality: N/A;  . LYSIS OF ADHESION N/A 11/01/2012   Procedure: LYSIS OF ADHESION;  Surgeon: Earnstine Regal, MD;  Location: [facility];  Service: General;  Laterality: N/A;  . RHINOPLASTY    . TONSILLECTOMY    . TOTAL ABDOMINAL HYSTERECTOMY      Family Psychiatric History: M & D history of anxiety.  No suicide.    Family History:  Family History  Problem Relation Age of Onset  . Other Mother        c difficile gastroenteritis  . Lung cancer Father        smoker  . Liver disease Brother   . Liver cancer Brother   . Asthma Other         grandmother  . Other Other        bronchitis-grandmother    Social History:   Lives alone. D lives in [place].  Good health and 2 kids. Social History   Socioeconomic History  . Marital status: Divorced    Spouse name: Not on file  . Number of children: 1  . Years of education: Not on file  . Highest education level: Not on file  Occupational History  . 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 Smoking status: Former Smoker    Types: Cigarettes    Quit date: 08/14/1980    Years since quitting: 40.2  . Smokeless tobacco: Never Used  Substance and Sexual Activity  . Alcohol use: No  . Drug use: No  . Sexual activity: Not on file  Other Topics Concern  . Not on file  Social History Narrative  . Not on file   Social Determinants of Health   Financial Resource Strain: Not on file  Food Insecurity: Not on file  Transportation Needs: Not on file  Physical Activity: Not on file  Stress: Not on file  Social Connections: Not on file    Allergies:  Allergies  Allergen Reactions  . Bee Venom Swelling  . Terconazole Nausea Only    Headaches and nausea    Metabolic Disorder Labs: No results found for: HGBA1C, MPG No results found for: PROLACTIN No results found for: CHOL, TRIG, HDL, CHOLHDL, VLDL, LDLCALC No results found for: TSH  Therapeutic Level Labs: No results found for: LITHIUM No results found for: VALPROATE No components found for:  CBMZ  Current Medications: Current Outpatient Medications  Medication Sig Dispense Refill  . conjugated estrogens (PREMARIN) vaginal cream Place 0.5 g vaginally 2 (two) times a week. Patient states that uses this just whenever she remembers during the week no specific days to list.    . estrogens, conjugated, (PREMARIN) 0.625 MG tablet Take 0.625 mg by mouth daily.     . finasteride (PROSCAR) 5 MG tablet Take 5 mg by mouth daily.    . fluticasone (FLONASE) 50 MCG/ACT nasal  spray Place 2 sprays into both nostrils daily as needed for allergies or rhinitis.    Marland Kitchen iron polysaccharides (NIFEREX) 150 MG capsule Take 150 mg by mouth daily.    [place] Pam Bl-Phen Sal (URISED PO) Take 1 tablet by mouth daily as needed (for bladder spasms).    Marland Kitchen omeprazole (PRILOSEC OTC) 20 MG tablet Take 20 mg by mouth daily as needed (acid reflux/indigestion.).     Marland Kitchen Polyethyl Glycol-Propyl Glycol (SYSTANE ULTRA) 0.4-0.3 % SOLN Place 1 drop into both eyes 3 (three) times daily as needed (dry/irritated eyes.).    Marland Kitchen  spironolactone (ALDACTONE) 50 MG tablet Take 1 tablet (50 mg total) by mouth daily. (Patient taking differently: Take 50 mg by mouth in the morning, at noon, and at bedtime.)    . Vitamin D, Ergocalciferol, (DRISDOL) 1.25 MG (50000 UNIT) CAPS capsule Take 50,000 Units by mouth every Saturday.    Marland Kitchen ZOVIRAX 200 MG capsule Take 200 mg by mouth daily.     Marland Kitchen LORazepam (ATIVAN) 0.5 MG tablet Take 0.5 tablets (0.25 mg total) by mouth in the morning and at bedtime. 30 tablet 5  . zaleplon (SONATA) 5 MG capsule Take 1 capsule (5 mg total) by mouth at bedtime as needed for sleep. 30 capsule 5   No current facility-administered medications for this visit.    Medication Side Effects: none  Orders placed this visit:  No orders of the defined types were placed in this encounter.   Psychiatric Specialty Exam:  Review of Systems  Constitutional: Negative for chills and fever.  HENT: Positive for hearing loss and tinnitus. Negative for congestion, sore throat, trouble swallowing and voice change.   Eyes: Positive for visual disturbance.  Respiratory: Negative for apnea, cough and shortness of breath.   Cardiovascular: Negative for chest pain and palpitations.  Gastrointestinal: Negative for constipation, diarrhea, nausea and vomiting.  Endocrine: Negative for polyuria.  Genitourinary: Negative for difficulty urinating, enuresis, frequency and urgency.  Musculoskeletal: Positive  for arthralgias. Negative for back pain, gait problem and neck pain.  Skin: Negative for rash.  Allergic/Immunologic: Negative for environmental allergies.  Neurological: Negative for dizziness, tremors, seizures, syncope, speech difficulty, weakness and headaches.  Psychiatric/Behavioral: The patient is nervous/anxious.     Blood pressure 139/81, pulse 78, height 4' 11.5" (1.511 m), weight 96 lb (43.5 kg).Body mass index is 19.07 kg/m.  General Appearance: Casual and Well Groomed  Eye Contact:  Good  Speech:  Clear and Coherent and Talkative  Volume:  Normal  Mood:  Anxious  Affect:  Appropriate, Congruent and Full Range  Thought Process:  Coherent and Descriptions of Associations: Intact  Orientation:  Full (Time, Place, and Person)  Thought Content: Logical and Hallucinations: None   Suicidal Thoughts:  No  Homicidal Thoughts:  No  Memory:  WNL  Judgement:  Good  Insight:  Good  Psychomotor Activity:  Normal  Concentration:  Concentration: Good  Recall:  Good  Fund of Knowledge: Good  Language: Good  Assets:  Communication Skills Desire for Improvement Financial Resources/Insurance Housing Leisure Time Physical Health Resilience Social Support Talents/Skills Transportation Vocational/Educational  ADL's:  Intact  Cognition: WNL  Prognosis:  Good   Screenings:  NO  Receiving Psychotherapy: Long history but not current.  Treatment Plan/Recommendations: Discussed her direct diagnosis and treatment history for anxiety.  Has tried antidepressants and other treatments for anxiety and insomnia without good response.  She is able to get benefit from a low-dose of lorazepam 0.5 mg daily and as needed zaleplon 5 mg for sleep.  She is not significantly depressed.  Her anxiety is manageable.  Discussed her concerns about a relationship that is recently been broken and some practical ways to address that issue with her friend without getting bogged down in third-party matters.   Option of continuing counseling with a therapist if needed was discussed.  Refilled her prescriptions.  We discussed the short-term risks associated with benzodiazepines including sedation and increased fall risk among others.  Discussed long-term side effect risk including dependence, potential withdrawal symptoms, and the potential eventual dose-related risk of dementia.  But recent studies  from 2020 dispute this association between benzodiazepines and dementia risk. Newer studies in 2020 do not support an association with dementia. Also discussed the risk of falls or amnesia from zaleplon.  Follow-up 6 months   Purnell Shoemaker, MD

## 2020-10-19 ENCOUNTER — Telehealth: Payer: Self-pay | Admitting: Psychiatry

## 2020-10-19 NOTE — Telephone Encounter (Signed)
Pt called and said that she was in yesterday and couldn't remember if she took 5 mg or 120 mg of sonata. She checked her bottle and she takes 10 mg of sonata and would like to have that sent to [facility] and cancel the 5 mg one

## 2020-10-19 NOTE — Telephone Encounter (Signed)
Okay to send 10 mg?

## 2020-10-19 NOTE — Telephone Encounter (Signed)
Yes please

## 2020-10-20 ENCOUNTER — Other Ambulatory Visit: Payer: Self-pay

## 2020-10-20 ENCOUNTER — Telehealth (HOSPITAL_COMMUNITY): Payer: Self-pay | Admitting: *Deleted

## 2020-10-20 DIAGNOSIS — L308 Other specified dermatitis: Secondary | ICD-10-CM | POA: Diagnosis not present

## 2020-10-20 DIAGNOSIS — L570 Actinic keratosis: Secondary | ICD-10-CM | POA: Diagnosis not present

## 2020-10-20 DIAGNOSIS — D485 Neoplasm of uncertain behavior of skin: Secondary | ICD-10-CM | POA: Diagnosis not present

## 2020-10-20 DIAGNOSIS — F5105 Insomnia due to other mental disorder: Secondary | ICD-10-CM

## 2020-10-20 MED ORDER — ZALEPLON 10 MG PO CAPS: 10.0000 mg | ORAL_CAPSULE | Freq: Every evening | ORAL | 5 refills | Status: DC | PRN

## 2020-10-20 NOTE — Telephone Encounter (Signed)
Patient given detailed instructions per Myocardial Perfusion Study Information Sheet for the test on 10/27/20 at 1000. Patient notified to arrive 15 minutes early and that it is imperative to arrive on time for appointment to keep from having the test rescheduled.  If you need to cancel or reschedule your appointment, please call the office within 24 hours of your appointment. . Patient verbalized understanding.Ameliana Brashear, Ranae Palms

## 2020-10-20 NOTE — Telephone Encounter (Signed)
Rx updated to 10 mg [facility]

## 2020-10-27 ENCOUNTER — Other Ambulatory Visit: Payer: Self-pay

## 2020-10-27 ENCOUNTER — Ambulatory Visit (HOSPITAL_COMMUNITY): Payer: Medicare Other | Attending: Cardiology

## 2020-10-27 VITALS — Ht 59.0 in | Wt 97.0 lb

## 2020-10-27 DIAGNOSIS — R110 Nausea: Secondary | ICD-10-CM | POA: Diagnosis not present

## 2020-10-27 DIAGNOSIS — R079 Chest pain, unspecified: Secondary | ICD-10-CM

## 2020-10-27 LAB — MYOCARDIAL PERFUSION IMAGING
LV dias vol: 34 mL (ref 46–106)
LV sys vol: 8 mL
Peak HR: 137 {beats}/min
Rest HR: 87 {beats}/min
SDS: 4
SRS: 1
SSS: 5
TID: 0.97

## 2020-10-27 MED ORDER — TECHNETIUM TC 99M TETROFOSMIN IV KIT
11.0000 | PACK | Freq: Once | INTRAVENOUS | Status: AC | PRN
  Administered 2020-10-27: 11 via INTRAVENOUS
  Filled 2020-10-27: qty 11

## 2020-10-27 MED ORDER — AMINOPHYLLINE 25 MG/ML IV SOLN
75.0000 mg | Freq: Once | INTRAVENOUS | Status: AC
  Administered 2020-10-27: 75 mg via INTRAVENOUS

## 2020-10-27 MED ORDER — TECHNETIUM TC 99M TETROFOSMIN IV KIT
31.1000 | PACK | Freq: Once | INTRAVENOUS | Status: AC | PRN
  Administered 2020-10-27: 31.1 via INTRAVENOUS
  Filled 2020-10-27: qty 32

## 2020-10-27 MED ORDER — REGADENOSON 0.4 MG/5ML IV SOLN
0.4000 mg | Freq: Once | INTRAVENOUS | Status: AC
  Administered 2020-10-27: 0.4 mg via INTRAVENOUS

## 2020-11-01 ENCOUNTER — Other Ambulatory Visit: Payer: Self-pay | Admitting: *Deleted

## 2020-11-01 MED ORDER — METOPROLOL SUCCINATE ER 25 MG PO TB24: 12.5000 mg | ORAL_TABLET | Freq: Every day | ORAL | 3 refills | Status: DC

## 2020-11-11 DIAGNOSIS — L308 Other specified dermatitis: Secondary | ICD-10-CM | POA: Diagnosis not present

## 2020-11-11 DIAGNOSIS — L570 Actinic keratosis: Secondary | ICD-10-CM | POA: Diagnosis not present

## 2020-11-11 DIAGNOSIS — L821 Other seborrheic keratosis: Secondary | ICD-10-CM | POA: Diagnosis not present

## 2020-11-11 DIAGNOSIS — L738 Other specified follicular disorders: Secondary | ICD-10-CM | POA: Diagnosis not present

## 2020-11-11 DIAGNOSIS — R21 Rash and other nonspecific skin eruption: Secondary | ICD-10-CM | POA: Diagnosis not present

## 2020-11-12 DIAGNOSIS — M25511 Pain in right shoulder: Secondary | ICD-10-CM | POA: Diagnosis not present

## 2020-11-15 DIAGNOSIS — F419 Anxiety disorder, unspecified: Secondary | ICD-10-CM | POA: Diagnosis not present

## 2020-11-15 DIAGNOSIS — K31819 Angiodysplasia of stomach and duodenum without bleeding: Secondary | ICD-10-CM | POA: Diagnosis not present

## 2020-11-15 DIAGNOSIS — D649 Anemia, unspecified: Secondary | ICD-10-CM | POA: Diagnosis not present

## 2020-11-15 DIAGNOSIS — Z0000 Encounter for general adult medical examination without abnormal findings: Secondary | ICD-10-CM | POA: Diagnosis not present

## 2020-11-15 DIAGNOSIS — R7989 Other specified abnormal findings of blood chemistry: Secondary | ICD-10-CM | POA: Diagnosis not present

## 2020-11-15 DIAGNOSIS — M79643 Pain in unspecified hand: Secondary | ICD-10-CM | POA: Diagnosis not present

## 2020-11-15 DIAGNOSIS — I340 Nonrheumatic mitral (valve) insufficiency: Secondary | ICD-10-CM | POA: Diagnosis not present

## 2020-11-15 DIAGNOSIS — M154 Erosive (osteo)arthritis: Secondary | ICD-10-CM | POA: Diagnosis not present

## 2020-11-15 DIAGNOSIS — M8580 Other specified disorders of bone density and structure, unspecified site: Secondary | ICD-10-CM | POA: Diagnosis not present

## 2020-11-15 DIAGNOSIS — M5380 Other specified dorsopathies, site unspecified: Secondary | ICD-10-CM | POA: Diagnosis not present

## 2020-11-15 DIAGNOSIS — R5383 Other fatigue: Secondary | ICD-10-CM | POA: Diagnosis not present

## 2020-11-15 DIAGNOSIS — R002 Palpitations: Secondary | ICD-10-CM | POA: Diagnosis not present

## 2020-11-15 DIAGNOSIS — R82998 Other abnormal findings in urine: Secondary | ICD-10-CM | POA: Diagnosis not present

## 2020-11-15 DIAGNOSIS — E559 Vitamin D deficiency, unspecified: Secondary | ICD-10-CM | POA: Diagnosis not present

## 2020-11-22 ENCOUNTER — Telehealth: Payer: Self-pay | Admitting: Podiatry

## 2020-11-22 NOTE — Telephone Encounter (Signed)
Called pt lvm to reschedule 4/25 appt. Appt can be moved to 4/27

## 2020-12-01 DIAGNOSIS — K625 Hemorrhage of anus and rectum: Secondary | ICD-10-CM | POA: Diagnosis not present

## 2020-12-02 DIAGNOSIS — M1711 Unilateral primary osteoarthritis, right knee: Secondary | ICD-10-CM | POA: Diagnosis not present

## 2020-12-03 ENCOUNTER — Telehealth: Payer: Self-pay | Admitting: Interventional Cardiology

## 2020-12-03 DIAGNOSIS — L270 Generalized skin eruption due to drugs and medicaments taken internally: Secondary | ICD-10-CM | POA: Diagnosis not present

## 2020-12-03 NOTE — Telephone Encounter (Signed)
Pt c/o medication issue:  1. Name of Medication: Plaqenil 200 mg but cut in half to 100 mg per Dr. Tamala Julian; metoprolol succinate (TOPROL XL) 25 MG 24 hr tablet but cut in half to 12.5 mg per Dr. Tamala Julian  2. How are you currently taking this medication (dosage and times per day)? Both dosage cut in half per doctor's order  3. Are you having a reaction (difficulty breathing--STAT)? Yes   4. What is your medication issue? Patient says that she has developed a rash all over her body and think that the Plaqenil is causing it. Feels that the Metoprolol Succinate is working really good. Not sure which medication is causing the rash

## 2020-12-03 NOTE — Telephone Encounter (Signed)
Spoke with pt and she states she developed a rash this week.  Spoke with doctor that gave her the Plaquenil and they had her stop this on Wednesday.  Pt seen PCP and they gave her a cream which seems to be helping so far.  Pt feels better today.  These are the two newest medications.  All others she has been on for quite some time.  Denies any changes in regular household/skin care items.  Advised pt to continue Metoprolol.  Contact our office in a week or two if rash doesn't resolve or returns after stopping the cream.

## 2020-12-04 DIAGNOSIS — M25511 Pain in right shoulder: Secondary | ICD-10-CM | POA: Diagnosis not present

## 2020-12-06 ENCOUNTER — Ambulatory Visit: Payer: Self-pay | Admitting: Podiatry

## 2020-12-08 DIAGNOSIS — L308 Other specified dermatitis: Secondary | ICD-10-CM | POA: Diagnosis not present

## 2020-12-08 DIAGNOSIS — L309 Dermatitis, unspecified: Secondary | ICD-10-CM | POA: Diagnosis not present

## 2020-12-08 DIAGNOSIS — R21 Rash and other nonspecific skin eruption: Secondary | ICD-10-CM | POA: Diagnosis not present

## 2020-12-09 DIAGNOSIS — M1711 Unilateral primary osteoarthritis, right knee: Secondary | ICD-10-CM | POA: Diagnosis not present

## 2020-12-10 ENCOUNTER — Ambulatory Visit: Payer: Medicare Other

## 2020-12-10 ENCOUNTER — Other Ambulatory Visit: Payer: Self-pay

## 2020-12-10 ENCOUNTER — Encounter: Payer: Self-pay | Admitting: Podiatry

## 2020-12-10 ENCOUNTER — Ambulatory Visit (INDEPENDENT_AMBULATORY_CARE_PROVIDER_SITE_OTHER): Payer: Medicare Other | Admitting: Podiatry

## 2020-12-10 DIAGNOSIS — M79671 Pain in right foot: Secondary | ICD-10-CM

## 2020-12-10 DIAGNOSIS — M79672 Pain in left foot: Secondary | ICD-10-CM

## 2020-12-10 DIAGNOSIS — L600 Ingrowing nail: Secondary | ICD-10-CM

## 2020-12-10 NOTE — Patient Instructions (Signed)

## 2020-12-10 NOTE — Progress Notes (Signed)
Subjective:   Patient ID: Haley Wade, female   DOB: 75 y.o.   MRN: [id number]   HPI Patient presents chronic ingrown toenail deformity hallux both feet stating they have been gradually becoming more of an aggravation over the last few months and patient does not smoke likes to be active   Review of Systems  All other systems reviewed and are negative.       Objective:  Physical Exam Vitals and nursing note reviewed.  Constitutional:      Appearance: She is well-developed.  Pulmonary:     Effort: Pulmonary effort is normal.  Musculoskeletal:        General: Normal range of motion.  Skin:    General: Skin is warm.  Neurological:     Mental Status: She is alert.     Neurovascular status intact muscle strength adequate range of motion adequate with patient found to have incurvated medial borders of the hallux bilateral with pain with pressure against the nailbeds causing this discomfort.  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  Patient wants surgery and today I went ahead and she signed consent form understanding risk.  I infiltrated each hallux 60 mg like Marcaine mixture sterile prep done and using sterile instrumentation remove the medial borders exposed matrix applied phenol 3 applications 30 seconds followed by alcohol lavage sterile dressing gave instructions for soaks reappoint to recheck again in the next several weeks

## 2020-12-16 DIAGNOSIS — M1711 Unilateral primary osteoarthritis, right knee: Secondary | ICD-10-CM | POA: Diagnosis not present

## 2020-12-17 DIAGNOSIS — M75121 Complete rotator cuff tear or rupture of right shoulder, not specified as traumatic: Secondary | ICD-10-CM | POA: Diagnosis not present

## 2020-12-20 ENCOUNTER — Encounter: Payer: Self-pay | Admitting: Podiatry

## 2020-12-27 ENCOUNTER — Ambulatory Visit (INDEPENDENT_AMBULATORY_CARE_PROVIDER_SITE_OTHER): Payer: Medicare Other | Admitting: Podiatry

## 2020-12-27 ENCOUNTER — Encounter: Payer: Self-pay | Admitting: Podiatry

## 2020-12-27 ENCOUNTER — Other Ambulatory Visit: Payer: Self-pay

## 2020-12-27 DIAGNOSIS — L03032 Cellulitis of left toe: Secondary | ICD-10-CM | POA: Diagnosis not present

## 2020-12-27 NOTE — Progress Notes (Signed)
Subjective:   Patient ID: Haley Wade, female   DOB: 75 y.o.   MRN: [id number]   HPI Patient is concerned about infection of the left big toe stating its been slightly red crusted over and the right 1 is doing better than that   ROS      Objective:  Physical Exam  Neurovascular status intact with the left hallux nail medial side showing crusted tissue localized slight erythema but no drainage or any other pathological process     Assessment:  Paronychia of the left hallux medial border localized     Plan:  Recommended soaks and wider shoes and discussed possible antibiotics and explained to her causes condition what to look out for and hopefully will heal uneventfully but she is encouraged to call us or come in if any issues were to occur

## 2020-12-29 ENCOUNTER — Other Ambulatory Visit: Payer: Self-pay

## 2020-12-29 ENCOUNTER — Ambulatory Visit (INDEPENDENT_AMBULATORY_CARE_PROVIDER_SITE_OTHER): Payer: Medicare Other | Admitting: Orthopedic Surgery

## 2020-12-29 ENCOUNTER — Encounter: Payer: Self-pay | Admitting: Orthopedic Surgery

## 2020-12-29 VITALS — Ht 59.0 in | Wt 96.0 lb

## 2020-12-29 DIAGNOSIS — M75121 Complete rotator cuff tear or rupture of right shoulder, not specified as traumatic: Secondary | ICD-10-CM | POA: Diagnosis not present

## 2020-12-30 ENCOUNTER — Encounter: Payer: Self-pay | Admitting: Orthopedic Surgery

## 2020-12-30 ENCOUNTER — Telehealth: Payer: Self-pay | Admitting: Orthopedic Surgery

## 2020-12-30 NOTE — Telephone Encounter (Signed)
Hi Debbie can you call her and tell her that we will get her set up for surgery in the near future.  I will get you a blue sheet as well.  I am going back over there.

## 2020-12-30 NOTE — Telephone Encounter (Signed)
Pt called stating she had a 2nd opinion appt with dr. Marlou Sa on 12/29/20 and she was so pleased with him and his recovery plan that she would feel more comfortable if he did her surgery. Pt would like to know what steps should she take to get on his surgery list? Pt would like a CB to discuss further.   [phone]

## 2020-12-30 NOTE — Progress Notes (Signed)
Office Visit Note   Patient: Haley Wade           Date of Birth: 01-Oct-1945           MRN: [id number] Visit Date: 12/29/2020 Requested by: Shon Baton, MD [address] [place],  [place] [address] PCP: Shon Baton, MD  Subjective: Chief Complaint  Patient presents with  . Right Shoulder - Pain    HPI: Haley Wade is a 75 year old patient with right shoulder pain.  Here for second opinion regarding her right shoulder.  She is a very active person and needs to do triathlons.  Currently he does spin class.  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  She was having mild trouble in the right shoulder for several years but it worsened this past January.  She has had physical therapy and an injection within the last 6 months.  She moved from [place] to here.  She does have friends in the area but her daughter lives in [place].  She does have some pain with lifting and describes discrete locking in the right shoulder which is a relatively new phenomenon.  MRI scan done fairly recently is reviewed.  Does show supraspinatus tear with retraction to the top of the humeral head with mild muscle atrophy on my review.  Infraspinatus tear also present with some retraction with no muscle atrophy.  Upper portion subscap tear is present again with no muscle atrophy.  Humeral head slightly high riding with no significant glenohumeral arthritis.  Biceps tendon is in the groove but superior portion could be unstable through the upper portion subscap tear.              ROS: All systems reviewed are negative as they relate to the chief complaint within the history of present illness.  Patient denies  fevers or chills.   Assessment & Plan: Visit Diagnoses:  1. Nontraumatic complete tear of right rotator cuff     Plan: Impression is significant rotator cuff pathology and active 75 year old patient.  She is having some trouble sleeping on that right-hand side and does  have trouble with pain and weakness with overhead activities on the right.  Rotator cuff tear may or may not be fixable at this time.  Surgical intervention would require arthroscopy with biceps tendon release and mini open rotator cuff tear repair of the infraspinatus supraspinatus and upper portion of the subscap with biceps tenodesis.  Mitigating factors against this approach would be her diagnosis of osteopenia as well as relative lack of family support in the area.  She does have friends in the area but I think after about a week she would more or less be on her own.  Nonetheless in favor of surgical repair would be the patient's active lifestyle and desire to avoid shoulder replacement in the future.  With too much more retraction these rotator cuff tears will not be repairable.  I think that process may take 1 to 2 years.  Patient understands the risk and benefits of surgical intervention.  I think it is possible and likely that the biggest pain generator in the shoulder at this time would be subluxating biceps tendon.  They could be treated with tenodesis at the time of surgery.  All these factors are discussed with her as a second opinion.  Patient has good understanding of the risk and benefits and her unique situation.  She will follow-up as needed. Follow-Up Instructions: Return if symptoms  worsen or fail to improve.   Orders:  No orders of the defined types were placed in this encounter.  No orders of the defined types were placed in this encounter.     Procedures: No procedures performed   Clinical Data: No additional findings.  Objective: Vital Signs: Ht 4\' 11"  (1.499 m)   Wt 96 lb (43.5 kg)   BMI 19.39 kg/m   Physical Exam:   Constitutional: Patient appears well-developed HEENT:  Head: Normocephalic Eyes:EOM are normal Neck: Normal range of motion Cardiovascular: Normal rate Pulmonary/chest: Effort normal Neurologic: Patient is alert Skin: Skin is warm Psychiatric:  Patient has normal mood and affect    Ortho Exam: Ortho exam demonstrates good cervical spine range of motion.  5 out of 5 grip EPL FPL interosseous wrist flexion extension bicep triceps and deltoid strength.  On the right-hand side she has no discrete AC joint tenderness at the [facility] joint.  No restriction of external rotation of 15 degrees of abduction.  Range of motion on the right is 60/100/170 passive.  Rotator cuff strength is slightly weak to infraspinatus testing 5- out of 5 on the right.  Subscap strength 5 out of 5 on the right.  She has a little bit of coarse grinding with active and passive range of motion above 90 degrees of abduction.  Negative apprehension relocation testing.  Specialty Comments:  No specialty comments available.  Imaging: No results found.   PMFS History: Patient Active Problem List   Diagnosis Date Noted  . Chest discomfort 01/26/2014  . Palpitations 01/26/2014  . Cellulitis 09/08/2013  . Abdominal pain, left mid abdomen, chronic 02/26/2013  . Hyponatremia 10/30/2012  . COPD, questioned 01/02/2012  . Allergic rhinitis due to pollen 10/20/2010  . MITRAL VALVE PROLAPSE, HX OF 05/26/2009  . UNSPECIFIED HEARING LOSS 05/21/2009  . 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 Allergic rhinitis, cause unspecified   . Cellulitis   . Chest discomfort   . COPD, questioned   . GAVE (gastric antral vascular ectasia)   . Hyponatremia   . MITRAL VALVE PROLAPSE, HX OF   . Other chronic sinusitis   . Palpitations   . Syncope   . Unspecified hearing loss     Family History  Problem Relation Age of Onset  . Other Mother        c difficile gastroenteritis  . Lung cancer Father        smoker  . Liver disease Brother   . Liver cancer Brother   . Asthma Other        grandmother  . Other Other        bronchitis-grandmother    Past Surgical History:  Procedure Laterality Date  . BLADDER SURGERY    . BOWEL RESECTION N/A  11/01/2012   Procedure: SMALL BOWEL RESECTION;  Surgeon: Earnstine Regal, MD;  Location: [facility];  Service: General;  Laterality: N/A;  . BREAST ENHANCEMENT SURGERY     revisions  . ESOPHAGOGASTRODUODENOSCOPY (EGD) WITH PROPOFOL N/A 10/14/2019   Procedure: ESOPHAGOGASTRODUODENOSCOPY (EGD) WITH PROPOFOL;  Surgeon: Clarene Essex, MD;  [facility];  Service: Endoscopy;  Laterality: N/A;  . GI RADIOFREQUENCY ABLATION  10/14/2019   Procedure: GI RADIOFREQUENCY ABLATION;  Surgeon: Clarene Essex, MD;  [facility];  Service: Endoscopy;;  . LAPAROSCOPY N/A 11/01/2012   Procedure: LAPAROSCOPY DIAGNOSTIC;  Surgeon: Earnstine Regal, MD;  [facility];  Service: General;  Laterality: N/A;  . LAPAROTOMY N/A  11/01/2012   Procedure: EXPLORATORY LAPAROTOMY;  Surgeon: Earnstine Regal, MD;  [facility]: [facility];  Service: General;  Laterality: N/A;  . LYSIS OF ADHESION N/A 11/01/2012   Procedure: LYSIS OF ADHESION;  Surgeon: Earnstine Regal, MD;  Location: [facility];  Service: General;  Laterality: N/A;  . RHINOPLASTY    . TONSILLECTOMY    . TOTAL ABDOMINAL HYSTERECTOMY     Social History   Occupational History  . 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 Smoking status: Former Smoker    Types: Cigarettes    Quit date: 08/14/1980    Years since quitting: 40.4  . Smokeless tobacco: Never Used  Substance and Sexual Activity  . Alcohol use: No  . Drug use: No  . Sexual activity: Not on file

## 2021-01-14 ENCOUNTER — Other Ambulatory Visit: Payer: Self-pay

## 2021-01-14 ENCOUNTER — Encounter: Payer: Self-pay | Admitting: Orthopedic Surgery

## 2021-01-14 ENCOUNTER — Ambulatory Visit (INDEPENDENT_AMBULATORY_CARE_PROVIDER_SITE_OTHER): Payer: Medicare Other | Admitting: Orthopedic Surgery

## 2021-01-14 DIAGNOSIS — M75121 Complete rotator cuff tear or rupture of right shoulder, not specified as traumatic: Secondary | ICD-10-CM | POA: Diagnosis not present

## 2021-01-14 MED ORDER — HYDROCODONE-IBUPROFEN 5-200 MG PO TABS: ORAL_TABLET | ORAL | 0 refills | Status: DC

## 2021-01-14 MED ORDER — METHOCARBAMOL 500 MG PO TABS: 500.0000 mg | ORAL_TABLET | Freq: Three times a day (TID) | ORAL | 0 refills | Status: DC | PRN

## 2021-01-14 NOTE — Progress Notes (Signed)
Office Visit Note   Patient: Haley Wade           Date of Birth: May 30, 1946           MRN: [id number] Visit Date: 01/14/2021 Requested by: Shon Baton, MD [address] [place],  [place] [address] PCP: Shon Baton, MD  Subjective: Chief Complaint  Patient presents with  . Other    Discuss surgical questions for upcoming surgery    HPI: Haley Wade is a patient with right shoulder rotator cuff tear.  She comes in to ask a few more questions about her upcoming surgery.  In general she wants to know about recovery time and what she will be capable of doing.  She does have some friends lined up to help her.              ROS: All systems reviewed are negative as they relate to the chief complaint within the history of present illness.  Patient denies  fevers or chills.   Assessment & Plan: Visit Diagnoses:  1. Nontraumatic complete tear of right rotator cuff     Plan: Impression is rotator cuff tear with some retraction.  Exam essentially the same.  Plan is arthroscopy with biceps tendon release biceps tenodesis and mini open rotator cuff tear repair with subsequent CPM machine for the first 2 weeks.  Her postop pain medicines are prescribed today along with muscle laxer.  Anticipate overnight hospitalization for observation and mobilization after surgery.  May require 1 week of home health PT for the first week after that for 2 weeks of home CPM use.  After that I think she could go to outpatient therapy.  We discussed how we may be able to get her out of the sling after the first 2 or 3 weeks depending on the stability of the repair.  Main concerns about Akaiya is bone quality and potential for pullout of the anchors.  Patient understands the risk and benefits and wishes to proceed.  All questions answered.  Follow-Up Instructions: No follow-ups on file.   Orders:  No orders of the defined types were placed in this encounter.  Meds ordered this encounter  Medications  .  methocarbamol (ROBAXIN) 500 MG tablet    Sig: Take 1 tablet (500 mg total) by mouth every 8 (eight) hours as needed for muscle spasms.    Dispense:  30 tablet    Refill:  0  . hydrocodone-ibuprofen (VICOPROFEN) 5-200 MG tablet    Sig: 1 po q 4-6hrs prn pain    Dispense:  35 tablet    Refill:  0      Procedures: No procedures performed   Clinical Data: No additional findings.  Objective: Vital Signs: There were no vitals taken for this visit.  Physical Exam:   Constitutional: Patient appears well-developed HEENT:  Head: Normocephalic Eyes:EOM are normal Neck: Normal range of motion Cardiovascular: Normal rate Pulmonary/chest: Effort normal Neurologic: Patient is alert Skin: Skin is warm Psychiatric: Patient has normal mood and affect    Ortho Exam: Ortho exam unchanged in terms of shoulder exam.  She does have forward flexion and abduction above shoulder level but it is painful.  Motor sensory function hand intact.  Specialty Comments:  No specialty comments available.  Imaging: No results found.   PMFS History: Patient Active Problem List   Diagnosis Date Noted  . Chest discomfort 01/26/2014  . Palpitations 01/26/2014  . Cellulitis 09/08/2013  . Abdominal pain, left mid abdomen, chronic 02/26/2013  .  Hyponatremia 10/30/2012  . COPD, questioned 01/02/2012  . Allergic rhinitis due to pollen 10/20/2010  . MITRAL VALVE PROLAPSE, HX OF 05/26/2009  . UNSPECIFIED HEARING LOSS 05/21/2009  . 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 Allergic rhinitis, cause unspecified   . Cellulitis   . Chest discomfort   . COPD, questioned   . GAVE (gastric antral vascular ectasia)   . Hyponatremia   . MITRAL VALVE PROLAPSE, HX OF   . Other chronic sinusitis   . Palpitations   . Syncope   . Unspecified hearing loss     Family History  Problem Relation Age of Onset  . Other Mother        c difficile gastroenteritis  . Lung cancer  Father        smoker  . Liver disease Brother   . Liver cancer Brother   . Asthma Other        grandmother  . Other Other        bronchitis-grandmother    Past Surgical History:  Procedure Laterality Date  . BLADDER SURGERY    . BOWEL RESECTION N/A 11/01/2012   Procedure: SMALL BOWEL RESECTION;  Surgeon: Earnstine Regal, MD;  [facility];  Service: General;  Laterality: N/A;  . BREAST ENHANCEMENT SURGERY     revisions  . ESOPHAGOGASTRODUODENOSCOPY (EGD) WITH PROPOFOL N/A 10/14/2019   Procedure: ESOPHAGOGASTRODUODENOSCOPY (EGD) WITH PROPOFOL;  Surgeon: Clarene Essex, MD;  [facility];  Service: Endoscopy;  Laterality: N/A;  . GI RADIOFREQUENCY ABLATION  10/14/2019   Procedure: GI RADIOFREQUENCY ABLATION;  Surgeon: Clarene Essex, MD;  [facility];  Service: Endoscopy;;  . LAPAROSCOPY N/A 11/01/2012   Procedure: LAPAROSCOPY DIAGNOSTIC;  Surgeon: Earnstine Regal, MD;  [facility];  Service: General;  Laterality: N/A;  . LAPAROTOMY N/A 11/01/2012   Procedure: EXPLORATORY LAPAROTOMY;  Surgeon: Earnstine Regal, MD;  [facility];  Service: General;  Laterality: N/A;  . LYSIS OF ADHESION N/A 11/01/2012   Procedure: LYSIS OF ADHESION;  Surgeon: Earnstine Regal, MD;  [facility];  Service: General;  Laterality: N/A;  . RHINOPLASTY    . TONSILLECTOMY    . TOTAL ABDOMINAL HYSTERECTOMY     Social History   Occupational History  . 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 Smoking status: Former Smoker    Types: Cigarettes    Quit date: 08/14/1980    Years since quitting: 40.4  . Smokeless tobacco: Never Used  Substance and Sexual Activity  . Alcohol use: No  . Drug use: No  . Sexual activity: Not on file

## 2021-01-22 ENCOUNTER — Encounter: Payer: Self-pay | Admitting: Orthopedic Surgery

## 2021-02-01 NOTE — Progress Notes (Signed)
Surgical Instructions    Your procedure is scheduled on 02/08/21.  Report to [facility] Main Entrance "A" at 09:00 A.M., then check in with the Admitting office.  Call this number if you have problems the morning of surgery:  [phone]   If you have any questions prior to your surgery date call [phone]: Open Monday-Friday 8am-4pm    Remember:  Do not eat after midnight the night before your surgery  You may drink clear liquids until 08:00am the morning of your surgery.   Clear liquids allowed are: Water, Non-Citrus Juices (without pulp), Carbonated Beverages, Clear Tea, Black Coffee Only, and Gatorade  Patient Instructions  The night before surgery:  No food after midnight. ONLY clear liquids after midnight  The day of surgery (if you do NOT have diabetes):  Drink ONE (1) Pre-Surgery Clear Ensure by 08:00am the morning of surgery. Drink in one sitting. Do not sip.  This drink was given to you during your hospital  pre-op appointment visit.  Nothing else to drink after completing the  Pre-Surgery Clear Ensure.          If you have questions, please contact your surgeon's office.     Take these medicines the morning of surgery with A SIP OF WATER  finasteride (PROSCAR)  fluticasone (FLONASE) if needed LORazepam (ATIVAN) methocarbamol (ROBAXIN) if needed metoprolol succinate (TOPROL XL) omeprazole (PRILOSEC OTC) if needed ZOVIRAX  As of today, STOP taking any Aspirin (unless otherwise instructed by your surgeon) Aleve, Naproxen, Ibuprofen, Motrin, Advil, Goody's, BC's, all herbal medications, fish oil, and all vitamins.          Do not wear jewelry or makeup Do not wear lotions, powders, perfumes/colognes, or deodorant. Do not shave 48 hours prior to surgery.  Men may shave face and neck. Do not bring valuables to the hospital. DO Not wear nail polish, gel polish, artificial nails, or any other type of covering on natural nails  including finger and toenails. If  patients have artificial nails, gel coating, etc. that need to be removed by a nail salon please have this removed prior to surgery or surgery may need to be canceled/delayed if the surgeon/ anesthesia feels like the patient is unable to be adequately monitored.             [facility] is not responsible for any belongings or valuables.  Do NOT Smoke (Tobacco/Vaping) or drink Alcohol 24 hours prior to your procedure If you use a CPAP at night, you may bring all equipment for your overnight stay.   Contacts, glasses, dentures or bridgework may not be worn into surgery, please bring cases for these belongings   For patients admitted to the hospital, discharge time will be determined by your treatment team.   Patients discharged the day of surgery will not be allowed to drive home, and someone needs to stay with them for 24 hours.  ONLY 1 SUPPORT PERSON MAY BE PRESENT WHILE YOU ARE IN SURGERY. IF YOU ARE TO BE ADMITTED ONCE YOU ARE IN YOUR ROOM YOU WILL BE ALLOWED TWO (2) VISITORS.  Minor children may have two parents present. Special consideration for safety and communication needs will be reviewed on a case by case basis.  Special instructions:    Oral Hygiene is also important to reduce your risk of infection.  Remember - BRUSH YOUR TEETH THE MORNING OF SURGERY WITH YOUR REGULAR TOOTHPASTE   [facility]- Preparing For Surgery  Before surgery, you can play an important role.  Because skin is not sterile, your skin needs to be as free of germs as possible. You can reduce the number of germs on your skin by washing with CHG (chlorahexidine gluconate) Soap before surgery.  CHG is an antiseptic cleaner which kills germs and bonds with the skin to continue killing germs even after washing.     Please do not use if you have an allergy to CHG or antibacterial soaps. If your skin becomes reddened/irritated stop using the CHG.  Do not shave (including legs and underarms) for at least 48 hours prior to  first CHG shower. It is OK to shave your face.  Please follow these instructions carefully.     Shower the NIGHT BEFORE SURGERY and the MORNING OF SURGERY with CHG Soap.   If you chose to wash your hair, wash your hair first as usual with your normal shampoo. After you shampoo, rinse your hair and body thoroughly to remove the shampoo.  Then [employer] and genitals (private parts) with your normal soap and rinse thoroughly to remove soap.  After that Use CHG Soap as you would any other liquid soap. You can apply CHG directly to the skin and wash gently with a scrungie or a clean washcloth.   Apply the CHG Soap to your body ONLY FROM THE NECK DOWN.  Do not use on open wounds or open sores. Avoid contact with your eyes, ears, mouth and genitals (private parts). Wash Face and genitals (private parts)  with your normal soap.   Wash thoroughly, paying special attention to the area where your surgery will be performed.  Thoroughly rinse your body with warm water from the neck down.  DO NOT shower/wash with your normal soap after using and rinsing off the CHG Soap.  Pat yourself dry with a CLEAN TOWEL.  Wear CLEAN PAJAMAS to bed the night before surgery  Place CLEAN SHEETS on your bed the night before your surgery  DO NOT SLEEP WITH PETS.   Day of Surgery: Take a shower with CHG soap. Wear Clean/Comfortable clothing the morning of surgery Do not apply any deodorants/lotions.   Remember to brush your teeth WITH YOUR REGULAR TOOTHPASTE.   Please read over the following fact sheets that you were given.

## 2021-02-02 ENCOUNTER — Other Ambulatory Visit: Payer: Self-pay

## 2021-02-02 ENCOUNTER — Encounter (HOSPITAL_COMMUNITY)
Admission: RE | Admit: 2021-02-02 | Discharge: 2021-02-02 | Disposition: A | Discharge status: Home / Self Care | Payer: Medicare Other | Source: Ambulatory Visit | Attending: Orthopedic Surgery | Admitting: Orthopedic Surgery

## 2021-02-02 ENCOUNTER — Encounter (HOSPITAL_COMMUNITY): Payer: Self-pay

## 2021-02-02 DIAGNOSIS — Z01812 Encounter for preprocedural laboratory examination: Secondary | ICD-10-CM | POA: Diagnosis not present

## 2021-02-02 HISTORY — DX: Unspecified osteoarthritis, unspecified site: M19.90

## 2021-02-02 HISTORY — DX: Anemia, unspecified: D64.9

## 2021-02-02 LAB — SURGICAL PCR SCREEN
MRSA, PCR: NEGATIVE
Staphylococcus aureus: NEGATIVE

## 2021-02-02 LAB — URINALYSIS, ROUTINE W REFLEX MICROSCOPIC
Bilirubin Urine: NEGATIVE
Glucose, UA: NEGATIVE mg/dL
Hgb urine dipstick: NEGATIVE
Ketones, ur: NEGATIVE mg/dL
Leukocytes,Ua: NEGATIVE
Nitrite: NEGATIVE
Protein, ur: NEGATIVE mg/dL
Specific Gravity, Urine: 1.02 (ref 1.005–1.030)
pH: 6 (ref 5.0–8.0)

## 2021-02-02 LAB — BASIC METABOLIC PANEL
Anion gap: 6 (ref 5–15)
BUN: 22 mg/dL (ref 8–23)
CO2: 26 mmol/L (ref 22–32)
Calcium: 9.5 mg/dL (ref 8.9–10.3)
Chloride: 101 mmol/L (ref 98–111)
Creatinine, Ser: 0.55 mg/dL (ref 0.44–1.00)
GFR, Estimated: >60 mL/min (ref >60)
Glucose, Bld: 88 mg/dL (ref 70–99)
Potassium: 4.6 mmol/L (ref 3.5–5.1)
Sodium: 133 mmol/L — ABNORMAL LOW (ref 135–145)

## 2021-02-02 LAB — CBC
HCT: 41.2 % (ref 36.0–46.0)
Hemoglobin: 13.1 g/dL (ref 12.0–15.0)
MCH: 28.3 pg (ref 26.0–34.0)
MCHC: 31.8 g/dL (ref 30.0–36.0)
MCV: 89 fL (ref 80.0–100.0)
Platelets: 270 10*3/uL (ref 150–400)
RBC: 4.63 MIL/uL (ref 3.87–5.11)
RDW: 13.1 % (ref 11.5–15.5)
WBC: 8.3 10*3/uL (ref 4.0–10.5)
nRBC: 0 % (ref 0.0–0.2)

## 2021-02-02 NOTE — Progress Notes (Signed)
PCP - Shon Baton, MD Cardiologist - Daneen Schick, MD  PPM/ICD - denies Device Orders - N/A Rep Notified - N/A  Chest x-ray - N/A EKG - 07/15/2020 Stress Test - 10/27/2020 ECHO - 01/20/2014 Cardiac Cath - denies  Sleep Study - denies CPAP - N/A  Fasting Blood Sugar - N/A  Blood Thinner Instructions: N/A Aspirin Instructions: Patient was instructed: As of today, STOP taking any Aspirin (unless otherwise instructed by your surgeon) Aleve, Naproxen, Ibuprofen, Motrin, Advil, Goody's, BC's, all herbal medications, fish oil, and all vitamins  ERAS Protcol - yes PRE-SURGERY Ensure - yes  COVID TEST- the test is scheduled for 02/03/2021 @ 08:45   Anesthesia review: yes; cardiac history  Patient denies shortness of breath, fever, cough and chest pain at PAT appointment   All instructions explained to the patient, with a verbal understanding of the material. Patient agrees to go over the instructions while at home for a better understanding. Patient also instructed to self quarantine after being tested for COVID-19. The opportunity to ask questions was provided.

## 2021-02-03 LAB — URINE CULTURE: Culture: <10000 — AB

## 2021-02-03 NOTE — Progress Notes (Signed)
Anesthesia Chart Review:  Case: [id number] Date/Time: 02/08/21 1045   Procedures:      SHOULDER ARTHROSCOPY WITH BICEPS TENDON RELEASE, MINI OPEN ROTATOR CUFF TEAR REPAIR OF THE INFRASPINATUS SUPRASPINATUS AND UPPER PORTIION OF THE SUBSCAP  WITH BICEPS TENODESIS (Right)     BICEPS TENODESIS (Right)   Anesthesia type: General   Pre-op diagnosis: right shoulder rotator cuff tear   Location: [facility] OR ROOM 06 / [facility]   Surgeons: Meredith Pel, MD       DISCUSSION: Patient is a 75 year old female scheduled for the above procedure.  History includes former smoker, COPD, MVP (Mild MR 01/20/14 echo), palpitations (PVCs), gastric antral vascular ectasia (GAVE), sensorineural hearing loss, anemia, SBO (s/p exploratory lap, LOA, small bowel resection 11/02/12), cystocele repair (01/03/19).  She had a recent cardiology evaluation by Dr. Tamala Julian for palpitations with "heavy heartbeat" and possible syncope/presyncope episode versus vertigo.  A cardiac monitor that showed SR with PVCs and 7 beast no-sustained episode of VT. A nuclear stress test was ordered which was non-ischemic, EF 77% on 10/27/20. For symptomatic palpitations, she could start Toprol XL12.5 mg daily and increase to 25 mg daily if needed and as BP allows.   Preoperative COVID-19 test is scheduled for 02/03/2021. Anesthesia team to evaluate on the day of surgery.   VS: BP 131/62   Pulse 78   Temp 36.7 C (Oral)   Resp 16   Ht 4\' 11"  (1.499 m)   Wt 44.9 kg   SpO2 100%   BMI 20.00 kg/m    PROVIDERS: Shon Baton, MD is PCP  - Daneen Schick, MD is cardiologist. 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 Reassurance given. Re-established on 07/15/20 for palpitations with recent fall and question of presyncope/syncope as she did have some lightheadedness. She had known prior history of vertigo. She did report issues  with "heavy heartbeat" with no sustained episodes. A 30 day event monitor was ordered and showed SR with isolated PVCs (correlating to fluttering), and 7 beat non-sustained VT. A stress test was recommended to evaluation for ischemic etiology but was non-ischemic with normal EF 10/27/20. Toprol XL 12.5 mg daily for symptomatic palpitations recommended and can increase to 25 mg daily as needed as BP allowed.   LABS: Labs reviewed: Acceptable for surgery. (all labs ordered are listed, but only abnormal results are displayed)  Labs Reviewed  URINE CULTURE - Abnormal; Notable for the following components:      Result Value   Culture   (*)    Value: <10,000 COLONIES/mL INSIGNIFICANT GROWTH Performed at [facility], [address] [place], [place] [address]    All other components within normal limits  BASIC METABOLIC PANEL - Abnormal; Notable for the following components:   Sodium 133 (*)    All other components within normal limits  SURGICAL PCR SCREEN  CBC  URINALYSIS, ROUTINE W REFLEX MICROSCOPIC    OTHER: EGD 10/14/19: Impression: - Small hiatal hernia. - LA Grade A reflux esophagitis with no bleeding. - Gastric antral vascular ectasia without bleeding. Treated with radiofrequency ablation. - Normal duodenal bulb, first portion of the duodenum, second portion of the duodenum and third portion of the duodenum. - The examination was otherwise normal. - No specimens collected.    EKG: 07/15/20 ([facility]): NSR, incomplete RBBB, LAHB, poor r wave progression.    CV: Nuclear stress test 10/27/20: Nuclear stress EF: 77%.  There was no ST segment deviation noted during stress. No T wave inversion was noted during stress. Normal perfusion with no evidence of ischemia or infarction. The study is normal. This is a low risk study. The left ventricular ejection fraction is hyperdynamic (>65%).    Cardiac event monitor:07/26/20-08/29/20: Study Highlights Normal sinus  rhythm Isolated PVC's that correlate with complaint of flutter. Non-sustained 7 beat VT, asymptomatic No sustained arrhythmia or pauses to explain syncope    Echo 01/20/14: Study Conclusions  - Left ventricle: The cavity size was normal. Systolic function was    normal. The estimated ejection fraction was in the range of 55%    to 60%. Wall motion was normal; there were no regional wall    motion abnormalities.  - Mitral valve: There was mild regurgitation.  - Atrial septum: No defect or patent foramen ovale was identified.  Past Medical History:  Diagnosis Date   Allergic rhinitis, cause unspecified    Anemia    Arthritis    Cellulitis    Chest discomfort    COPD, questioned    GAVE (gastric antral vascular ectasia)    Hyponatremia    MITRAL VALVE PROLAPSE, HX OF    Other chronic sinusitis    Palpitations    Syncope    Unspecified hearing loss     Past Surgical History:  Procedure Laterality Date   APPENDECTOMY     BACK SURGERY     BLADDER SURGERY     BOWEL RESECTION N/A 11/01/2012   Procedure: SMALL BOWEL RESECTION;  Surgeon: Earnstine Regal, MD;  Location: [facility];  Service: General;  Laterality: N/A;   BREAST ENHANCEMENT SURGERY     revisions   BREAST SURGERY     CHOLECYSTECTOMY     COLON SURGERY     ESOPHAGOGASTRODUODENOSCOPY (EGD) WITH PROPOFOL N/A 10/14/2019   Procedure: ESOPHAGOGASTRODUODENOSCOPY (EGD) WITH PROPOFOL;  Surgeon: Clarene Essex, MD;  Location: [facility];  Service: Endoscopy;  Laterality: N/A;   EYE SURGERY     GI RADIOFREQUENCY ABLATION  10/14/2019   Procedure: GI RADIOFREQUENCY ABLATION;  Surgeon: Clarene Essex, MD;  Location: [facility];  Service: Endoscopy;;   LAPAROSCOPY N/A 11/01/2012   Procedure: LAPAROSCOPY DIAGNOSTIC;  Surgeon: Earnstine Regal, MD;  Location: [facility];  Service: General;  Laterality: N/A;   LAPAROTOMY N/A 11/01/2012   Procedure: EXPLORATORY LAPAROTOMY;  Surgeon: Earnstine Regal, MD;  Location: [facility];  Service: General;   Laterality: N/A;   LYSIS OF ADHESION N/A 11/01/2012   Procedure: LYSIS OF ADHESION;  Surgeon: Earnstine Regal, MD;  Location: [facility];  Service: General;  Laterality: N/A;   RHINOPLASTY     TONSILLECTOMY     TOTAL ABDOMINAL HYSTERECTOMY      MEDICATIONS:  conjugated estrogens (PREMARIN) vaginal cream   estrogens, conjugated, (PREMARIN) 0.625 MG tablet   finasteride (PROSCAR) 5 MG tablet   fluticasone (FLONASE) 50 MCG/ACT nasal spray   hydrocodone-ibuprofen (VICOPROFEN) 5-200 MG tablet   ibuprofen (ADVIL) 800 MG tablet   iron polysaccharides (NIFEREX) 150 MG capsule   LORazepam (ATIVAN) 0.5 MG tablet   Methen-Bella-Meth Bl-Phen Sal (URISED PO)   methocarbamol (ROBAXIN) 500 MG tablet   metoprolol succinate (TOPROL XL) 25 MG 24 hr tablet   omeprazole (PRILOSEC OTC) 20 MG tablet   Polyethyl Glycol-Propyl Glycol (SYSTANE ULTRA) 0.4-0.3 % SOLN   spironolactone (ALDACTONE) 50 MG tablet   Vitamin D, Ergocalciferol, (DRISDOL) 1.25 MG (50000 UNIT) CAPS capsule   zaleplon (SONATA) 10 MG capsule  ZOVIRAX 200 MG capsule   No current facility-administered medications for this encounter.    Myra Gianotti, PA-C Surgical Short Stay/Anesthesiology [facility] Phone [phone] [facility] Phone [phone] 02/03/2021 3:29 PM

## 2021-02-03 NOTE — Anesthesia Preprocedure Evaluation (Addendum)
Anesthesia Evaluation  Patient identified by MRN, date of birth, ID band Patient awake    Reviewed: Allergy & Precautions, NPO status , Patient's Chart, lab work & pertinent test results  Airway Mallampati: II  TM Distance: >3 FB Neck ROM: Full    Dental  (+) Dental Advisory Given, Teeth Intact, Caps   Pulmonary COPD, former smoker,    Pulmonary exam normal breath sounds clear to auscultation       Cardiovascular hypertension, Pt. on home beta blockers Normal cardiovascular exam Rhythm:Regular Rate:Normal  NM Stress 10/2020  Nuclear stress EF: 77%. There was no ST segment deviation noted during stress. No T wave inversion was noted during stress. Normal perfusion with no evidence of ischemia or infarction. The study is normal. This is a low risk study. The left ventricular ejection fraction is hyperdynamic (>65%).     Neuro/Psych negative neurological ROS     GI/Hepatic negative GI ROS, Neg liver ROS,   Endo/Other  negative endocrine ROS  Renal/GU negative Renal ROS     Musculoskeletal  (+) Arthritis ,   Abdominal   Peds  Hematology  (+) Blood dyscrasia, anemia ,   Anesthesia Other Findings   Reproductive/Obstetrics                           Anesthesia Physical Anesthesia Plan  ASA: 3  Anesthesia Plan: General   Post-op Pain Management:    Induction: Intravenous  PONV Risk Score and Plan: 3 and Ondansetron, Dexamethasone, Treatment may vary due to age or medical condition, Midazolam and Diphenhydramine  Airway Management Planned: Oral ETT  Additional Equipment: None  Intra-op Plan:   Post-operative Plan: Extubation in OR  Informed Consent: I have reviewed the patients History and Physical, chart, labs and discussed the procedure including the risks, benefits and alternatives for the proposed anesthesia with the patient or authorized representative who has indicated  his/her understanding and acceptance.     Dental advisory given  Plan Discussed with: CRNA  Anesthesia Plan Comments: (PAT note written 02/03/2021 by Myra Gianotti, PA-C. )      Anesthesia Quick Evaluation

## 2021-02-04 ENCOUNTER — Other Ambulatory Visit (HOSPITAL_COMMUNITY)
Admission: RE | Admit: 2021-02-04 | Discharge: 2021-02-04 | Disposition: A | Discharge status: Home / Self Care | Payer: Medicare Other | Source: Ambulatory Visit | Attending: Orthopedic Surgery | Admitting: Orthopedic Surgery

## 2021-02-04 DIAGNOSIS — Z01812 Encounter for preprocedural laboratory examination: Secondary | ICD-10-CM | POA: Diagnosis not present

## 2021-02-04 DIAGNOSIS — Z20822 Contact with and (suspected) exposure to covid-19: Secondary | ICD-10-CM | POA: Insufficient documentation

## 2021-02-04 LAB — SARS CORONAVIRUS 2 (TAT 6-24 HRS): SARS Coronavirus 2: NEGATIVE

## 2021-02-07 ENCOUNTER — Telehealth: Payer: Self-pay

## 2021-02-07 NOTE — Telephone Encounter (Signed)
If Medicare does not pay for Medicare does not pay for it and we will have to roll with that.  As long she has a CPM brace she is she will be okay.

## 2021-02-07 NOTE — Telephone Encounter (Signed)
Sonia Side called stated he would not be able to provide patient with HHPT for 1 week as ordered. Stated Medicare would not pay for this as it was considered "episodic care"? Please advise.

## 2021-02-08 ENCOUNTER — Encounter (HOSPITAL_COMMUNITY)
Admission: RE | Disposition: A | Discharge status: Home / Self Care | Payer: Self-pay | Source: Home / Self Care | Attending: Orthopedic Surgery

## 2021-02-08 ENCOUNTER — Other Ambulatory Visit: Payer: Self-pay

## 2021-02-08 ENCOUNTER — Encounter (HOSPITAL_COMMUNITY): Payer: Self-pay | Admitting: Orthopedic Surgery

## 2021-02-08 ENCOUNTER — Ambulatory Visit (HOSPITAL_COMMUNITY): Payer: Medicare Other | Admitting: Certified Registered Nurse Anesthetist

## 2021-02-08 ENCOUNTER — Ambulatory Visit (HOSPITAL_COMMUNITY): Payer: Medicare Other | Admitting: Vascular Surgery

## 2021-02-08 ENCOUNTER — Observation Stay (HOSPITAL_COMMUNITY)
Admission: RE | Admit: 2021-02-08 | Discharge: 2021-02-09 | Disposition: A | Discharge status: Home / Self Care | Payer: Medicare Other | Attending: Orthopedic Surgery | Admitting: Orthopedic Surgery

## 2021-02-08 DIAGNOSIS — Z79899 Other long term (current) drug therapy: Secondary | ICD-10-CM | POA: Diagnosis not present

## 2021-02-08 DIAGNOSIS — S46111A Strain of muscle, fascia and tendon of long head of biceps, right arm, initial encounter: Secondary | ICD-10-CM | POA: Diagnosis not present

## 2021-02-08 DIAGNOSIS — M75101 Unspecified rotator cuff tear or rupture of right shoulder, not specified as traumatic: Secondary | ICD-10-CM | POA: Diagnosis not present

## 2021-02-08 DIAGNOSIS — X58XXXA Exposure to other specified factors, initial encounter: Secondary | ICD-10-CM | POA: Insufficient documentation

## 2021-02-08 DIAGNOSIS — M75121 Complete rotator cuff tear or rupture of right shoulder, not specified as traumatic: Principal | ICD-10-CM

## 2021-02-08 DIAGNOSIS — Z87891 Personal history of nicotine dependence: Secondary | ICD-10-CM | POA: Diagnosis not present

## 2021-02-08 DIAGNOSIS — M25511 Pain in right shoulder: Secondary | ICD-10-CM | POA: Diagnosis present

## 2021-02-08 DIAGNOSIS — Z9889 Other specified postprocedural states: Secondary | ICD-10-CM

## 2021-02-08 DIAGNOSIS — M7521 Bicipital tendinitis, right shoulder: Secondary | ICD-10-CM

## 2021-02-08 DIAGNOSIS — E871 Hypo-osmolality and hyponatremia: Secondary | ICD-10-CM | POA: Diagnosis not present

## 2021-02-08 DIAGNOSIS — G8918 Other acute postprocedural pain: Secondary | ICD-10-CM | POA: Diagnosis not present

## 2021-02-08 HISTORY — PX: SHOULDER ARTHROSCOPY WITH OPEN ROTATOR CUFF REPAIR AND DISTAL CLAVICLE ACROMINECTOMY: SHX5683

## 2021-02-08 HISTORY — PX: BICEPT TENODESIS: SHX5116

## 2021-02-08 SURGERY — SHOULDER ARTHROSCOPY WITH OPEN ROTATOR CUFF REPAIR AND DISTAL CLAVICLE ACROMINECTOMY
Anesthesia: General | Laterality: Right

## 2021-02-08 MED ORDER — ONDANSETRON HCL 4 MG PO TABS: 4.0000 mg | ORAL_TABLET | Freq: Four times a day (QID) | ORAL | Status: DC | PRN

## 2021-02-08 MED ORDER — ASPIRIN EC 81 MG PO TBEC
81.0000 mg | DELAYED_RELEASE_TABLET | Freq: Every day | ORAL | Status: DC
  Administered 2021-02-09: 81 mg via ORAL
  Filled 2021-02-08: qty 1

## 2021-02-08 MED ORDER — TRANEXAMIC ACID-NACL 1000-0.7 MG/100ML-% IV SOLN
1000.0000 mg | INTRAVENOUS | Status: AC
  Administered 2021-02-08: 1000 mg via INTRAVENOUS

## 2021-02-08 MED ORDER — EPINEPHRINE PF 1 MG/ML IJ SOLN
INTRAMUSCULAR | Status: AC
  Filled 2021-02-08: qty 2

## 2021-02-08 MED ORDER — MORPHINE SULFATE (PF) 2 MG/ML IV SOLN
0.5000 mg | INTRAVENOUS | Status: DC | PRN
Start: 2021-02-08 — End: 2021-02-09

## 2021-02-08 MED ORDER — ACETAMINOPHEN 500 MG PO TABS
500.0000 mg | ORAL_TABLET | Freq: Four times a day (QID) | ORAL | Status: DC
  Administered 2021-02-08 – 2021-02-09 (×3): 500 mg via ORAL
  Filled 2021-02-08 (×3): qty 1

## 2021-02-08 MED ORDER — DOCUSATE SODIUM 100 MG PO CAPS
100.0000 mg | ORAL_CAPSULE | Freq: Two times a day (BID) | ORAL | Status: DC
  Administered 2021-02-08 – 2021-02-09 (×2): 100 mg via ORAL
  Filled 2021-02-08 (×2): qty 1

## 2021-02-08 MED ORDER — SPIRONOLACTONE 25 MG PO TABS
50.0000 mg | ORAL_TABLET | Freq: Every day | ORAL | Status: DC
  Administered 2021-02-08 – 2021-02-09 (×2): 50 mg via ORAL
  Filled 2021-02-08 (×2): qty 2

## 2021-02-08 MED ORDER — ALBUMIN HUMAN 5 % IV SOLN: INTRAVENOUS | Status: DC | PRN

## 2021-02-08 MED ORDER — PHENYLEPHRINE HCL-NACL 10-0.9 MG/250ML-% IV SOLN
INTRAVENOUS | Status: DC | PRN
  Administered 2021-02-08: 50 ug/min via INTRAVENOUS

## 2021-02-08 MED ORDER — BUPIVACAINE LIPOSOME 1.3 % IJ SUSP
INTRAMUSCULAR | Status: DC | PRN
  Administered 2021-02-08: 10 mL via PERINEURAL

## 2021-02-08 MED ORDER — ROCURONIUM BROMIDE 100 MG/10ML IV SOLN
INTRAVENOUS | Status: DC | PRN
  Administered 2021-02-08: 40 mg via INTRAVENOUS
  Administered 2021-02-08: 20 mg via INTRAVENOUS

## 2021-02-08 MED ORDER — AMISULPRIDE (ANTIEMETIC) 5 MG/2ML IV SOLN
INTRAVENOUS | Status: AC
  Filled 2021-02-08: qty 4

## 2021-02-08 MED ORDER — ONDANSETRON HCL 4 MG/2ML IJ SOLN
INTRAMUSCULAR | Status: AC
  Filled 2021-02-08: qty 2

## 2021-02-08 MED ORDER — EPINEPHRINE PF 1 MG/ML IJ SOLN
INTRAMUSCULAR | Status: DC | PRN
  Administered 2021-02-08: 1 mg

## 2021-02-08 MED ORDER — OMEPRAZOLE MAGNESIUM 20 MG PO TBEC: 20.0000 mg | DELAYED_RELEASE_TABLET | Freq: Every day | ORAL | Status: DC | PRN

## 2021-02-08 MED ORDER — TRANEXAMIC ACID-NACL 1000-0.7 MG/100ML-% IV SOLN
INTRAVENOUS | Status: AC
  Filled 2021-02-08: qty 100

## 2021-02-08 MED ORDER — LIDOCAINE 2% (20 MG/ML) 5 ML SYRINGE
INTRAMUSCULAR | Status: AC
  Filled 2021-02-08: qty 5

## 2021-02-08 MED ORDER — FENTANYL CITRATE (PF) 100 MCG/2ML IJ SOLN
INTRAMUSCULAR | Status: AC
  Administered 2021-02-08: 50 ug via INTRAVENOUS
  Filled 2021-02-08: qty 2

## 2021-02-08 MED ORDER — AMISULPRIDE (ANTIEMETIC) 5 MG/2ML IV SOLN
10.0000 mg | Freq: Once | INTRAVENOUS | Status: AC
  Administered 2021-02-08: 10 mg via INTRAVENOUS

## 2021-02-08 MED ORDER — FENTANYL CITRATE (PF) 100 MCG/2ML IJ SOLN: 25.0000 ug | INTRAMUSCULAR | Status: DC | PRN

## 2021-02-08 MED ORDER — BUPIVACAINE HCL (PF) 0.5 % IJ SOLN
INTRAMUSCULAR | Status: DC | PRN
  Administered 2021-02-08: 10 mL via PERINEURAL

## 2021-02-08 MED ORDER — PROMETHAZINE HCL 25 MG/ML IJ SOLN
INTRAMUSCULAR | Status: AC
  Filled 2021-02-08: qty 1

## 2021-02-08 MED ORDER — EPHEDRINE 5 MG/ML INJ
INTRAVENOUS | Status: AC
  Filled 2021-02-08: qty 10

## 2021-02-08 MED ORDER — ROCURONIUM BROMIDE 10 MG/ML (PF) SYRINGE
PREFILLED_SYRINGE | INTRAVENOUS | Status: AC
  Filled 2021-02-08: qty 10

## 2021-02-08 MED ORDER — HYDROCODONE-ACETAMINOPHEN 5-325 MG PO TABS
1.0000 | ORAL_TABLET | ORAL | Status: DC | PRN
  Administered 2021-02-09: 1 via ORAL
  Filled 2021-02-08: qty 1

## 2021-02-08 MED ORDER — MIDAZOLAM HCL 2 MG/2ML IJ SOLN
2.0000 mg | Freq: Once | INTRAMUSCULAR | Status: AC
  Filled 2021-02-08: qty 2

## 2021-02-08 MED ORDER — PHENOL 1.4 % MT LIQD: 1.0000 | OROMUCOSAL | Status: DC | PRN

## 2021-02-08 MED ORDER — FENTANYL CITRATE (PF) 250 MCG/5ML IJ SOLN
INTRAMUSCULAR | Status: AC
  Filled 2021-02-08: qty 5

## 2021-02-08 MED ORDER — METOCLOPRAMIDE HCL 5 MG PO TABS: 5.0000 mg | ORAL_TABLET | Freq: Three times a day (TID) | ORAL | Status: DC | PRN

## 2021-02-08 MED ORDER — LIDOCAINE HCL (CARDIAC) PF 100 MG/5ML IV SOSY
PREFILLED_SYRINGE | INTRAVENOUS | Status: DC | PRN
  Administered 2021-02-08: 40 mg via INTRAVENOUS

## 2021-02-08 MED ORDER — METOPROLOL SUCCINATE 12.5 MG HALF TABLET
12.5000 mg | ORAL_TABLET | Freq: Every day | ORAL | Status: DC
  Administered 2021-02-09: 12.5 mg via ORAL
  Filled 2021-02-08: qty 1

## 2021-02-08 MED ORDER — ACETAMINOPHEN 325 MG PO TABS: 325.0000 mg | ORAL_TABLET | Freq: Four times a day (QID) | ORAL | Status: DC | PRN

## 2021-02-08 MED ORDER — CEFAZOLIN SODIUM-DEXTROSE 1-4 GM/50ML-% IV SOLN
1.0000 g | Freq: Three times a day (TID) | INTRAVENOUS | Status: DC
  Administered 2021-02-08 – 2021-02-09 (×2): 1 g via INTRAVENOUS
  Filled 2021-02-08 (×2): qty 50

## 2021-02-08 MED ORDER — FENTANYL CITRATE (PF) 100 MCG/2ML IJ SOLN
50.0000 ug | Freq: Once | INTRAMUSCULAR | Status: AC
  Filled 2021-02-08: qty 1

## 2021-02-08 MED ORDER — METOCLOPRAMIDE HCL 5 MG/ML IJ SOLN: 5.0000 mg | Freq: Three times a day (TID) | INTRAMUSCULAR | Status: DC | PRN

## 2021-02-08 MED ORDER — METHOCARBAMOL 1000 MG/10ML IJ SOLN
500.0000 mg | Freq: Four times a day (QID) | INTRAVENOUS | Status: DC | PRN
  Filled 2021-02-08: qty 5

## 2021-02-08 MED ORDER — SODIUM CHLORIDE (PF) 0.9 % IJ SOLN
INTRAMUSCULAR | Status: DC | PRN
  Administered 2021-02-08: 30 mL via INTRAVENOUS

## 2021-02-08 MED ORDER — LACTATED RINGERS IV SOLN: INTRAVENOUS | Status: DC

## 2021-02-08 MED ORDER — DEXAMETHASONE SODIUM PHOSPHATE 10 MG/ML IJ SOLN
INTRAMUSCULAR | Status: DC | PRN
  Administered 2021-02-08: 10 mg via INTRAVENOUS

## 2021-02-08 MED ORDER — BUPIVACAINE HCL (PF) 0.25 % IJ SOLN
INTRAMUSCULAR | Status: AC
  Filled 2021-02-08: qty 30

## 2021-02-08 MED ORDER — METHOCARBAMOL 500 MG PO TABS: 500.0000 mg | ORAL_TABLET | Freq: Four times a day (QID) | ORAL | Status: DC | PRN

## 2021-02-08 MED ORDER — FINASTERIDE 5 MG PO TABS
5.0000 mg | ORAL_TABLET | Freq: Every day | ORAL | Status: DC
  Administered 2021-02-09: 5 mg via ORAL
  Filled 2021-02-08: qty 1

## 2021-02-08 MED ORDER — CEFAZOLIN SODIUM-DEXTROSE 2-4 GM/100ML-% IV SOLN
2.0000 g | INTRAVENOUS | Status: AC
  Administered 2021-02-08: 2 g via INTRAVENOUS

## 2021-02-08 MED ORDER — PROPOFOL 10 MG/ML IV BOLUS
INTRAVENOUS | Status: DC | PRN
  Administered 2021-02-08: 100 mg via INTRAVENOUS

## 2021-02-08 MED ORDER — CEFAZOLIN SODIUM-DEXTROSE 2-4 GM/100ML-% IV SOLN
INTRAVENOUS | Status: AC
  Filled 2021-02-08: qty 100

## 2021-02-08 MED ORDER — PROPOFOL 10 MG/ML IV BOLUS
INTRAVENOUS | Status: AC
  Filled 2021-02-08: qty 20

## 2021-02-08 MED ORDER — POVIDONE-IODINE 7.5 % EX SOLN
Freq: Once | CUTANEOUS | Status: DC
  Filled 2021-02-08: qty 118

## 2021-02-08 MED ORDER — DEXAMETHASONE SODIUM PHOSPHATE 10 MG/ML IJ SOLN
INTRAMUSCULAR | Status: AC
  Filled 2021-02-08: qty 1

## 2021-02-08 MED ORDER — POVIDONE-IODINE 10 % EX SWAB
2.0000 "application " | Freq: Once | CUTANEOUS | Status: AC
  Administered 2021-02-08: 2 via TOPICAL

## 2021-02-08 MED ORDER — SODIUM CHLORIDE 0.9 % IR SOLN
Status: DC | PRN
  Administered 2021-02-08: 1000 mL
  Administered 2021-02-08: 3000 mL

## 2021-02-08 MED ORDER — ONDANSETRON HCL 4 MG/2ML IJ SOLN: 4.0000 mg | Freq: Four times a day (QID) | INTRAMUSCULAR | Status: DC | PRN

## 2021-02-08 MED ORDER — MENTHOL 3 MG MT LOZG: 1.0000 | LOZENGE | OROMUCOSAL | Status: DC | PRN

## 2021-02-08 MED ORDER — FENTANYL CITRATE (PF) 100 MCG/2ML IJ SOLN
INTRAMUSCULAR | Status: DC | PRN
  Administered 2021-02-08: 100 ug via INTRAVENOUS

## 2021-02-08 MED ORDER — SUGAMMADEX SODIUM 200 MG/2ML IV SOLN
INTRAVENOUS | Status: DC | PRN
  Administered 2021-02-08: 200 mg via INTRAVENOUS

## 2021-02-08 MED ORDER — EPHEDRINE SULFATE-NACL 50-0.9 MG/10ML-% IV SOSY
PREFILLED_SYRINGE | INTRAVENOUS | Status: DC | PRN
  Administered 2021-02-08: 10 mg via INTRAVENOUS
  Administered 2021-02-08: 5 mg via INTRAVENOUS

## 2021-02-08 MED ORDER — PROMETHAZINE HCL 25 MG/ML IJ SOLN
6.2500 mg | INTRAMUSCULAR | Status: DC | PRN
  Administered 2021-02-08: 6.25 mg via INTRAVENOUS

## 2021-02-08 MED ORDER — CHLORHEXIDINE GLUCONATE 0.12 % MT SOLN
15.0000 mL | Freq: Once | OROMUCOSAL | Status: AC
Start: 2021-02-08 — End: 2021-02-08

## 2021-02-08 MED ORDER — LORAZEPAM 0.5 MG PO TABS
0.2500 mg | ORAL_TABLET | Freq: Two times a day (BID) | ORAL | Status: DC
  Administered 2021-02-08: 0.25 mg via ORAL
  Filled 2021-02-08 (×2): qty 1

## 2021-02-08 MED ORDER — CHLORHEXIDINE GLUCONATE 0.12 % MT SOLN
OROMUCOSAL | Status: AC
  Administered 2021-02-08: 15 mL via OROMUCOSAL
  Filled 2021-02-08: qty 15

## 2021-02-08 MED ORDER — ONDANSETRON HCL 4 MG/2ML IJ SOLN
INTRAMUSCULAR | Status: DC | PRN
  Administered 2021-02-08: 4 mg via INTRAVENOUS

## 2021-02-08 MED ORDER — MIDAZOLAM HCL 2 MG/2ML IJ SOLN
INTRAMUSCULAR | Status: AC
  Administered 2021-02-08: 2 mg via INTRAVENOUS
  Filled 2021-02-08: qty 2

## 2021-02-08 MED ORDER — ORAL CARE MOUTH RINSE
15.0000 mL | Freq: Once | OROMUCOSAL | Status: AC
Start: 2021-02-08 — End: 2021-02-08

## 2021-02-08 SURGICAL SUPPLY — 64 items
ANCHOR FBRTK 2.6 SUTURETAP 1.3 (Anchor) ×4 IMPLANT
ANCHOR SUT 1.8 FBRTK KNTLS 2SU (Anchor) ×6 IMPLANT
ANCHOR SUT SWIVELLOK BIO (Anchor) ×2 IMPLANT
ANCHOR SWIVELOCK BIO 4.75X19.1 (Anchor) ×6 IMPLANT
BLADE EXCALIBUR 4.0X13 (MISCELLANEOUS) ×2 IMPLANT
BLADE SURG 11 STRL SS (BLADE) ×2 IMPLANT
COOLER ICEMAN CLASSIC (MISCELLANEOUS) ×2 IMPLANT
COVER SURGICAL LIGHT HANDLE (MISCELLANEOUS) ×2 IMPLANT
COVER WAND RF STERILE (DRAPES) ×2 IMPLANT
DRAPE INCISE IOBAN 66X45 STRL (DRAPES) ×4 IMPLANT
DRAPE STERI 35X30 U-POUCH (DRAPES) ×2 IMPLANT
DRAPE U-SHAPE 47X51 STRL (DRAPES) ×4 IMPLANT
DRSG PAD ABDOMINAL 8X10 ST (GAUZE/BANDAGES/DRESSINGS) ×2 IMPLANT
DRSG TEGADERM 4X4.75 (GAUZE/BANDAGES/DRESSINGS) ×2 IMPLANT
DURAPREP 26ML APPLICATOR (WOUND CARE) ×2 IMPLANT
ELECT REM PT RETURN 9FT ADLT (ELECTROSURGICAL) ×2
ELECTRODE REM PT RTRN 9FT ADLT (ELECTROSURGICAL) ×1 IMPLANT
FILTER STRAW FLUID ASPIR (MISCELLANEOUS) ×2 IMPLANT
GAUZE SPONGE 4X4 12PLY STRL LF (GAUZE/BANDAGES/DRESSINGS) ×2 IMPLANT
GAUZE XEROFORM 1X8 LF (GAUZE/BANDAGES/DRESSINGS) ×2 IMPLANT
GLOVE SRG 8 PF TXTR STRL LF DI (GLOVE) ×1 IMPLANT
GLOVE SURG LTX SZ8 (GLOVE) ×2 IMPLANT
GLOVE SURG UNDER POLY LF SZ8 (GLOVE) ×2
GOWN STRL REUS W/ TWL LRG LVL3 (GOWN DISPOSABLE) ×3 IMPLANT
GOWN STRL REUS W/TWL LRG LVL3 (GOWN DISPOSABLE) ×6
KIT BASIN OR (CUSTOM PROCEDURE TRAY) ×2 IMPLANT
KIT STR SPEAR 1.8 FBRTK DISP (KITS) ×2 IMPLANT
KIT TURNOVER KIT B (KITS) ×2 IMPLANT
MANIFOLD NEPTUNE II (INSTRUMENTS) ×2 IMPLANT
NEEDLE HYPO 25X1 1.5 SAFETY (NEEDLE) ×2 IMPLANT
NEEDLE SCORPION MULTI FIRE (NEEDLE) ×2 IMPLANT
NEEDLE SPNL 18GX3.5 QUINCKE PK (NEEDLE) ×2 IMPLANT
NEEDLE TAPERED W/ NITINOL LOOP (MISCELLANEOUS) ×2 IMPLANT
NS IRRIG 1000ML POUR BTL (IV SOLUTION) ×2 IMPLANT
PACK SHOULDER (CUSTOM PROCEDURE TRAY) ×2 IMPLANT
PAD ARMBOARD 7.5X6 YLW CONV (MISCELLANEOUS) ×4 IMPLANT
PORT APPOLLO RF 90DEGREE MULTI (SURGICAL WAND) ×2 IMPLANT
RESTRAINT HEAD UNIVERSAL NS (MISCELLANEOUS) ×2 IMPLANT
SLING ARM IMMOBILIZER MED (SOFTGOODS) ×2 IMPLANT
SPONGE LAP 4X18 RFD (DISPOSABLE) ×4 IMPLANT
SPONGE T-LAP 18X18 ~~LOC~~+RFID (SPONGE) ×2 IMPLANT
SPONGE T-LAP 4X18 ~~LOC~~+RFID (SPONGE) ×6 IMPLANT
STRIP CLOSURE SKIN 1/2X4 (GAUZE/BANDAGES/DRESSINGS) ×2 IMPLANT
SUCTION FRAZIER HANDLE 10FR (MISCELLANEOUS) ×2
SUCTION TUBE FRAZIER 10FR DISP (MISCELLANEOUS) ×1 IMPLANT
SUT ETHILON 3 0 PS 1 (SUTURE) ×2 IMPLANT
SUT FIBERWIRE 2-0 18 17.9 3/8 (SUTURE) ×10
SUT MNCRL AB 3-0 PS2 18 (SUTURE) ×2 IMPLANT
SUT VIC AB 0 CT1 27 (SUTURE) ×2
SUT VIC AB 0 CT1 27XBRD ANBCTR (SUTURE) ×1 IMPLANT
SUT VIC AB 1 CT1 27 (SUTURE) ×4
SUT VIC AB 1 CT1 27XBRD ANBCTR (SUTURE) ×2 IMPLANT
SUT VIC AB 2-0 CT1 27 (SUTURE) ×2
SUT VIC AB 2-0 CT1 TAPERPNT 27 (SUTURE) ×1 IMPLANT
SUT VICRYL 0 AB UR-6 (SUTURE) ×2 IMPLANT
SUT VICRYL 0 UR6 27IN ABS (SUTURE) ×20 IMPLANT
SUTURE FIBERWR 2-0 18 17.9 3/8 (SUTURE) ×5 IMPLANT
SYR 20ML LL LF (SYRINGE) ×4 IMPLANT
SYR 3ML LL SCALE MARK (SYRINGE) ×2 IMPLANT
SYR TB 1ML LUER SLIP (SYRINGE) ×2 IMPLANT
TOWEL GREEN STERILE (TOWEL DISPOSABLE) ×2 IMPLANT
TOWEL GREEN STERILE FF (TOWEL DISPOSABLE) ×2 IMPLANT
TUBING ARTHROSCOPY IRRIG 16FT (MISCELLANEOUS) ×2 IMPLANT
WATER STERILE IRR 1000ML POUR (IV SOLUTION) ×2 IMPLANT

## 2021-02-08 NOTE — H&P (Signed)
[place] is an 75 y.o. female.   Chief Complaint: right shoulder pain HPI: [place] is a 75 year old patient with right shoulder pain.  [place] for second opinion regarding her right shoulder.  She is a very active person and needs to do triathlons.  Currently he does spin class.  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  She was having mild trouble in the right shoulder for several years but it worsened this past January.  She has had physical therapy and an injection within the last 6 months.  [place]  She does have friends in the area but her daughter lives in [place].  She does have some pain with lifting and describes discrete locking in the right shoulder which is a relatively new phenomenon.  MRI scan done fairly recently is reviewed.  Does show supraspinatus tear with retraction to the top of the humeral head with mild muscle atrophy on my review.  Infraspinatus tear also present with some retraction with no muscle atrophy.  Upper portion subscap tear is present again with no muscle atrophy.  Humeral head slightly high riding with no significant glenohumeral arthritis.  Biceps tendon is in the groove but superior portion could be unstable through the upper portion subscap tear.  Past Medical History:  Diagnosis Date   Allergic rhinitis, cause unspecified    Anemia    Arthritis    Cellulitis    Chest discomfort    COPD, questioned    GAVE (gastric antral vascular ectasia)    Hyponatremia    MITRAL VALVE PROLAPSE, HX OF    Other chronic sinusitis    Palpitations    Syncope    Unspecified hearing loss     Past Surgical History:  Procedure Laterality Date   APPENDECTOMY     BACK SURGERY     BLADDER SURGERY     BOWEL RESECTION N/A 11/01/2012   Procedure: SMALL BOWEL RESECTION;  Surgeon: Earnstine Regal, MD;  Location: [facility];  Service: General;  Laterality: N/A;   BREAST ENHANCEMENT SURGERY     revisions   BREAST  SURGERY     CHOLECYSTECTOMY     COLON SURGERY     ESOPHAGOGASTRODUODENOSCOPY (EGD) WITH PROPOFOL N/A 10/14/2019   Procedure: ESOPHAGOGASTRODUODENOSCOPY (EGD) WITH PROPOFOL;  Surgeon: Clarene Essex, MD;  [facility];  Service: Endoscopy;  Laterality: N/A;   EYE SURGERY     GI RADIOFREQUENCY ABLATION  10/14/2019   Procedure: GI RADIOFREQUENCY ABLATION;  Surgeon: Clarene Essex, MD;  [facility];  Service: Endoscopy;;   LAPAROSCOPY N/A 11/01/2012   Procedure: LAPAROSCOPY DIAGNOSTIC;  Surgeon: Earnstine Regal, MD;  Location: [facility];  Service: General;  Laterality: N/A;   LAPAROTOMY N/A 11/01/2012   Procedure: EXPLORATORY LAPAROTOMY;  Surgeon: Earnstine Regal, MD;  Location: [facility];  Service: General;  Laterality: N/A;   LYSIS OF ADHESION N/A 11/01/2012   Procedure: LYSIS OF ADHESION;  Surgeon: Earnstine Regal, MD;  Location: [facility];  Service: General;  Laterality: N/A;   RHINOPLASTY     TONSILLECTOMY     TOTAL ABDOMINAL HYSTERECTOMY      Family History  Problem Relation Age of Onset   Other Mother        c difficile gastroenteritis   Lung cancer Father        smoker   Liver disease Brother    Liver cancer Brother    Asthma Other  grandmother   Other Other        bronchitis-grandmother   Social History:  reports that she quit smoking about 40 years ago. Her smoking use included cigarettes. She has never used smokeless tobacco. She reports that she does not drink alcohol and does not use drugs.  Allergies:  Allergies  Allergen Reactions   Bee Venom Swelling   Terconazole Nausea Only    Headaches and nausea   Plaquenil [Hydroxychloroquine Sulfate] Rash    Medications Prior to Admission  Medication Sig Dispense Refill   conjugated estrogens (PREMARIN) vaginal cream Place 0.5 g vaginally every 7 (seven) days.     estrogens, conjugated, (PREMARIN) 0.625 MG tablet Take 0.625 mg by mouth daily.      finasteride (PROSCAR) 5 MG tablet Take 5 mg by mouth daily.      fluticasone (FLONASE) 50 MCG/ACT nasal spray Place 2 sprays into both nostrils daily as needed for allergies or rhinitis.     ibuprofen (ADVIL) 800 MG tablet Take 800 mg by mouth every 8 (eight) hours as needed for moderate pain.     iron polysaccharides (NIFEREX) 150 MG capsule Take 150 mg by mouth daily.     LORazepam (ATIVAN) 0.5 MG tablet Take 0.5 tablets (0.25 mg total) by mouth in the morning and at bedtime. 30 tablet 5   Methen-Bella-Meth Bl-Phen Sal (URISED PO) Take 1 tablet by mouth daily as needed (for bladder spasms).     metoprolol succinate (TOPROL XL) 25 MG 24 hr tablet Take 0.5 tablets (12.5 mg total) by mouth daily. 45 tablet 3   Polyethyl Glycol-Propyl Glycol (SYSTANE ULTRA) 0.4-0.3 % SOLN Place 1 drop into both eyes 3 (three) times daily as needed (dry/irritated eyes.).     spironolactone (ALDACTONE) 50 MG tablet Take 1 tablet (50 mg total) by mouth daily. (Patient taking differently: Take 50 mg by mouth in the morning, at noon, and at bedtime.)     Vitamin D, Ergocalciferol, (DRISDOL) 1.25 MG (50000 UNIT) CAPS capsule Take 50,000 Units by mouth every Saturday.     zaleplon (SONATA) 10 MG capsule Take 1 capsule (10 mg total) by mouth at bedtime as needed for sleep. 30 capsule 5   ZOVIRAX 200 MG capsule Take 200 mg by mouth daily.      hydrocodone-ibuprofen (VICOPROFEN) 5-200 MG tablet 1 po q 4-6hrs prn pain 35 tablet 0   methocarbamol (ROBAXIN) 500 MG tablet Take 1 tablet (500 mg total) by mouth every 8 (eight) hours as needed for muscle spasms. 30 tablet 0   omeprazole (PRILOSEC OTC) 20 MG tablet Take 20 mg by mouth daily as needed (acid reflux/indigestion.).       No results found for this or any previous visit (from the past 48 hour(s)). No results found.  Review of Systems  Musculoskeletal:  Positive for arthralgias.  All other systems reviewed and are negative.  Blood pressure (!) 134/56, pulse 72, temperature 98.3 F (36.8 C), temperature source Oral, resp. rate 20,  height 4\' 11"  (1.499 m), weight 44.9 kg, SpO2 98 %. Physical Exam Vitals reviewed.  HENT:     Head: Normocephalic.     Nose: Nose normal.     Mouth/Throat:     Mouth: Mucous membranes are moist.  Eyes:     Pupils: Pupils are equal, round, and reactive to light.  Cardiovascular:     Rate and Rhythm: Normal rate.     Pulses: Normal pulses.  Pulmonary:     Effort: Pulmonary effort is normal.  Abdominal:     General: Abdomen is flat.  Musculoskeletal:     Cervical back: Normal range of motion.  Skin:    General: Skin is warm.     Capillary Refill: Capillary refill takes less than 2 seconds.  Neurological:     General: No focal deficit present.     Mental Status: She is alert.  Psychiatric:        Mood and Affect: Mood normal.   Ortho exam demonstrates good cervical spine range of motion.  5 out of 5 grip EPL FPL interosseous wrist flexion extension bicep triceps and deltoid strength.  On the right-hand side she has no discrete AC joint tenderness at the [facility] joint.  No restriction of external rotation of 15 degrees of abduction.  Range of motion on the right is 60/100/170 passive.  Rotator cuff strength is slightly weak to infraspinatus testing 5- out of 5 on the right.  Subscap strength 5 out of 5 on the right.  She has a little bit of coarse grinding with active and passive range of motion above 90 degrees of abduction.  Negative apprehension relocation testing  Assessment/Plan Impression is significant rotator cuff pathology and active 75 year old patient.  She is having some trouble sleeping on that right-hand side and does have trouble with pain and weakness with overhead activities on the right.  Rotator cuff tear may or may not be fixable at this time.  Surgical intervention would require arthroscopy with biceps tendon release and mini open rotator cuff tear repair of the infraspinatus supraspinatus and upper portion of the subscap with biceps tenodesis.  Mitigating factors against  this approach would be her diagnosis of osteopenia as well as relative lack of family support in the area.  She does have friends in the area but I think after about a week she would more or less be on her own.  Nonetheless in favor of surgical repair would be the patient's active lifestyle and desire to avoid shoulder replacement in the future.  With too much more retraction these rotator cuff tears will not be repairable.  I think that process may take 1 to 2 years.  Patient understands the risk and benefits of surgical intervention.  I think it is possible and likely that the biggest pain generator in the shoulder at this time would be subluxating biceps tendon.  They could be treated with tenodesis at the time of surgery.  Risk and benefits of surgery are discussed including not limited to infection nerve vessel damage shoulder stiffness as well as incomplete pain relief and inability to achieve watertight repair.  Patient understands risk and benefits as well as the extended nature of the rehabilitative process.  All questions answered. Anderson Malta, MD 02/08/2021, 10:24 AM

## 2021-02-08 NOTE — Brief Op Note (Signed)
   02/08/2021  1:44 PM  PATIENT:  [place]  75 y.o. female  PRE-OPERATIVE DIAGNOSIS:  right shoulder rotator cuff tear  POST-OPERATIVE DIAGNOSIS:  right shoulder rotator cuff tear  PROCEDURE:  Procedure(s): SHOULDER ARTHROSCOPY WITH BICEPS TENDON RELEASE, MINI OPEN ROTATOR CUFF TEAR REPAIR OF THE INFRASPINATUS SUPRASPINATUS AND UPPER PORTIION OF THE SUBSCAP  WITH BICEPS TENODESIS BICEPS TENODESIS  SURGEON:  Surgeon(s): Meredith Pel, MD  ASSISTANT: magnant pa  ANESTHESIA:   general  EBL: 15 ml    Total I/O In: 7225 [I.V.:900; IV Piggyback:450] Out: 50 [Blood:50]  BLOOD ADMINISTERED: none  DRAINS: none   LOCAL MEDICATIONS USED:  none  SPECIMEN:  No Specimen  COUNTS:  YES  TOURNIQUET:  * No tourniquets in log *  DICTATION: .Other Dictation: Dictation Number done  PLAN OF CARE: Admit for overnight observation  PATIENT DISPOSITION:  PACU - hemodynamically stable

## 2021-02-08 NOTE — Anesthesia Postprocedure Evaluation (Signed)
Anesthesia Post Note  Patient: [place]  Procedure(s) Performed: SHOULDER ARTHROSCOPY WITH BICEPS TENDON RELEASE, MINI OPEN ROTATOR CUFF TEAR REPAIR OF THE INFRASPINATUS SUPRASPINATUS AND UPPER PORTIION OF THE SUBSCAP  WITH BICEPS TENODESIS (Right) BICEPS TENODESIS (Right)     Patient location during evaluation: PACU Anesthesia Type: General Level of consciousness: sedated and patient cooperative Pain management: pain level controlled Vital Signs Assessment: post-procedure vital signs reviewed and stable Respiratory status: spontaneous breathing Cardiovascular status: stable Anesthetic complications: no   No notable events documented.  Last Vitals:  Vitals:   02/08/21 1543 02/08/21 2023  BP: 108/65 (!) 98/44  Pulse: 74 74  Resp: 17 20  Temp: 36.6 C 36.6 C  SpO2: 99% 97%    Last Pain:  Vitals:   02/08/21 2110  TempSrc:   PainSc: 0-No pain                 Nolon Nations

## 2021-02-08 NOTE — Anesthesia Procedure Notes (Signed)
Procedure Name: Intubation Date/Time: 02/08/2021 11:05 AM Performed by: Michele Rockers, CRNA Pre-anesthesia Checklist: Patient identified, Patient being monitored, Timeout performed, Emergency Drugs available and Suction available Patient Re-evaluated:Patient Re-evaluated prior to induction Oxygen Delivery Method: Circle System Utilized Preoxygenation: Pre-oxygenation with 100% oxygen Induction Type: IV induction Ventilation: Mask ventilation without difficulty Laryngoscope Size: Miller and 2 Grade View: Grade II Tube type: Oral Tube size: 7.0 mm Number of attempts: 1 Airway Equipment and Method: Stylet Placement Confirmation: ETT inserted through vocal cords under direct vision, positive ETCO2 and breath sounds checked- equal and bilateral Secured at: 21 cm Tube secured with: Tape Dental Injury: Teeth and Oropharynx as per pre-operative assessment

## 2021-02-08 NOTE — Anesthesia Procedure Notes (Signed)
Anesthesia Regional Block: Interscalene brachial plexus block   Pre-Anesthetic Checklist: , timeout performed,  Correct Patient, Correct Site, Correct Laterality,  Correct Procedure, Correct Position, site marked,  Risks and benefits discussed,  Surgical consent,  Pre-op evaluation,  At surgeon's request and post-op pain management  Laterality: Upper  Prep: chloraprep       Needles:  Injection technique: Single-shot  Needle Type: Stimiplex          Additional Needles:   Procedures:,,,, ultrasound used (permanent image in chart),,    Narrative:  Start time: 02/08/2021 9:30 AM End time: 02/08/2021 9:48 AM  Performed by: Personally  Anesthesiologist: Nolon Nations, MD  Additional Notes: Patient tolerated the procedure well without complications

## 2021-02-08 NOTE — Telephone Encounter (Signed)
IC no answer. Left detailed VM advising per below and could call back with further questions if needed.

## 2021-02-08 NOTE — Plan of Care (Addendum)
Patient stable, VS stable, no complaint of pain, still numb from block, skin warm, patient ambulated to [facility] x1 and to the bathroom x1, patient tolerated ambulation well. Will continue to monitor patient.    Problem: Education: Goal: Knowledge of General Education information will improve Description: Including pain rating scale, medication(s)/side effects and non-pharmacologic comfort measures Outcome: Progressing   Problem: Activity: Goal: Risk for activity intolerance will decrease Outcome: Progressing   Problem: Pain Managment: Goal: General experience of comfort will improve Outcome: Progressing   Problem: Safety: Goal: Ability to remain free from injury will improve Outcome: Progressing   Problem: Skin Integrity: Goal: Risk for impaired skin integrity will decrease Outcome: Progressing

## 2021-02-08 NOTE — Transfer of Care (Signed)
Immediate Anesthesia Transfer of Care Note  Patient: Haley Wade  Procedure(s) Performed: SHOULDER ARTHROSCOPY WITH BICEPS TENDON RELEASE, MINI OPEN ROTATOR CUFF TEAR REPAIR OF THE INFRASPINATUS SUPRASPINATUS AND UPPER PORTIION OF THE SUBSCAP  WITH BICEPS TENODESIS (Right) BICEPS TENODESIS (Right)  Patient Location: PACU  Anesthesia Type:General  Level of Consciousness: awake, alert  and responds to stimulation  Airway & Oxygen Therapy: Patient Spontanous Breathing and Patient connected to nasal cannula oxygen  Post-op Assessment: Report given to RN and Post -op Vital signs reviewed and stable  Post vital signs: Reviewed and stable  Last Vitals:  Vitals Value Taken Time  BP 112/56 02/08/21 1345  Temp    Pulse 81 02/08/21 1346  Resp 7 02/08/21 1346  SpO2 98 % 02/08/21 1346  Vitals shown include unvalidated device data.  Last Pain:  Vitals:   02/08/21 0947  TempSrc: Oral  PainSc:          Complications: No notable events documented.

## 2021-02-09 DIAGNOSIS — M7521 Bicipital tendinitis, right shoulder: Secondary | ICD-10-CM | POA: Diagnosis not present

## 2021-02-09 DIAGNOSIS — S46111A Strain of muscle, fascia and tendon of long head of biceps, right arm, initial encounter: Secondary | ICD-10-CM | POA: Diagnosis not present

## 2021-02-09 DIAGNOSIS — M75121 Complete rotator cuff tear or rupture of right shoulder, not specified as traumatic: Secondary | ICD-10-CM | POA: Diagnosis not present

## 2021-02-09 DIAGNOSIS — Z79899 Other long term (current) drug therapy: Secondary | ICD-10-CM | POA: Diagnosis not present

## 2021-02-09 DIAGNOSIS — Z87891 Personal history of nicotine dependence: Secondary | ICD-10-CM | POA: Diagnosis not present

## 2021-02-09 NOTE — Evaluation (Signed)
Occupational Therapy Evaluation Patient Details Name: Haley Wade MRN: [id number] DOB: 11/10/1945 Today's Date: 02/09/2021    History of Present Illness Haley Wade is a 75 y.o. female s/p right rotator cuff repair. PMHX Allergic rhinitis, cause unspecified , Anemia, Arthritis, Cellulitis, Chest discomfort, COPD, questioned   GAVE (gastric antral vascular ectasia)   Hyponatremia, MITRAL VALVE PROLAPSE, Other chronic sinusitis, Palpitations, Syncope,  Unspecified hearing loss   Clinical Impression   Pt is at sup - min A with ADLs, Ind with mobility. Pt will have a friend staying with her to assist her at home. Pt educated on shoulder protocol with handouts provided, NO ROM of R shoulder per MD, ROM exercises allowed for R elbow, wrist, hand, bathing/dressing techniques, sling wear/positioning. Pt verbalized and demos understanding. All education completed and no further acute OT services are indicated at this time    Follow Up Recommendations  Follow surgeon's recommendation for DC plan and follow-up therapies    Equipment Recommendations  None recommended by OT    Recommendations for Other Services       Precautions / Restrictions Precautions Precautions: Shoulder Type of Shoulder Precautions: NO ROM R shoulder, ok for elbow, wrist and hand ROM exercises (handouts provided) Shoulder Interventions: Shoulder sling/immobilizer;At all times;Off for dressing/bathing/exercises Precaution Booklet Issued: Yes (comment) Precaution Comments: shoulder protocol handout provided Required Braces or Orthoses: Sling Restrictions Weight Bearing Restrictions: Yes RUE Weight Bearing: Non weight bearing      Mobility Bed Mobility               General bed mobility comments: pt in recliner upon arrival    Transfers Overall transfer level: Independent Equipment used: None                  Balance Overall balance assessment: No apparent balance deficits (not formally  assessed)                                         ADL either performed or assessed with clinical judgement   ADL                                         General ADL Comments: min A with bathing and dressing, will have friend staying with her for a few days to assist her as needed. Pt educated on ADL compensatory techniques     Vision Baseline Vision/History: Wears glasses Wears Glasses: At all times Patient Visual Report: No change from baseline       Perception     Praxis      Pertinent Vitals/Pain Pain Assessment: No/denies pain     Hand Dominance Right   Extremity/Trunk Assessment Upper Extremity Assessment Upper Extremity Assessment: Overall WFL for tasks assessed;RUE deficits/detail RUE: Unable to fully assess due to immobilization       Cervical / Trunk Assessment Cervical / Trunk Assessment: Normal   Communication Communication Communication: No difficulties   Cognition Arousal/Alertness: Awake/alert Behavior During Therapy: WFL for tasks assessed/performed Overall Cognitive Status: Within Functional Limits for tasks assessed                                     General Comments  Exercises General Exercises - Upper Extremity Elbow Flexion: PROM;Right;5 reps Elbow Extension: PROM;Right;5 reps Wrist Flexion: PROM;Right;5 reps Wrist Extension: PROM;Right;5 reps Digit Composite Flexion: PROM;Right;5 reps Composite Extension: PROM;Right;5 reps   Shoulder Instructions Shoulder Instructions Donning/doffing shirt without moving shoulder: Minimal assistance Method for sponge bathing under operated UE: Supervision/safety Donning/doffing sling/immobilizer: Supervision/safety Correct positioning of sling/immobilizer: Supervision/safety ROM for elbow, wrist and digits of operated UE: Independent Sling wearing schedule (on at all times/off for ADL's): Independent Proper positioning of operated UE when  showering: Independent Positioning of UE while sleeping: Haley Wade expects to be discharged to:: Private residence Living Arrangements: Alone Available Help at Discharge: Friend(s);Available PRN/intermittently Type of Home: Other(Comment) (condo with elevater) Home Access: Level entry;Elevator     Home Layout: One level     Bathroom Shower/Tub: Occupational psychologist: Handicapped height     Home Equipment: None          Prior Functioning/Environment Level of Independence: Independent                 OT Problem List: Decreased range of motion;Decreased coordination;Impaired UE functional use      OT Treatment/Interventions:      OT Goals(Current goals can be found in the care plan section) Acute Rehab OT Goals Patient Stated Goal: go home  OT Frequency:     Barriers to D/C:            Co-evaluation              AM-PAC OT "6 Clicks" Daily Activity     Outcome Measure Help from another person eating meals?: None Help from another person taking care of personal grooming?: None Help from another person toileting, which includes using toliet, bedpan, or urinal?: None Help from another person bathing (including washing, rinsing, drying)?: A Little Help from another person to put on and taking off regular upper body clothing?: None Help from another person to put on and taking off regular lower body clothing?: A Little 6 Click Score: 22   End of Session Equipment Utilized During Treatment: Other (comment) (R UE sling) Nurse Communication: Other (comment) (all shoulder, ADL education has been completed, no DME needed)  Activity Tolerance: Patient tolerated treatment well Patient left: in chair  OT Visit Diagnosis: Muscle weakness (generalized) (M62.81)                Time: 6962-9528 OT Time Calculation (min): 52 min Charges:  OT General Charges $OT Visit: 1 Visit OT Evaluation $OT Eval Low Complexity: 1  Low OT Treatments $Self Care/Home Management : 8-22 mins $Therapeutic Activity: 8-22 mins $Therapeutic Exercise: 8-22 mins    Britt Bottom 02/09/2021, 10:39 AM

## 2021-02-09 NOTE — Plan of Care (Signed)

## 2021-02-09 NOTE — Progress Notes (Signed)
Atrivan tablet wasted and witnessed by charge nurse.

## 2021-02-09 NOTE — Progress Notes (Signed)
  Subjective: Haley Wade is a 75 y.o. female s/p right rotator cuff repair.  They are POD 1.  Pt's pain is controlled.  Block is still in effect but wearing off in her fingers and she is able to move her fingers now.  She has been up to walk and has been walking to the bathroom without any dizziness or lightheadedness.  Denies any significant complaints.  Objective: Vital signs in last 24 hours: Temp:  [97 F (36.1 C)-98.5 F (36.9 C)] 98.5 F (36.9 C) (06/29 0818) Pulse Rate:  [66-82] 79 (06/29 0818) Resp:  [10-22] 20 (06/29 0818) BP: (98-159)/(43-74) 106/52 (06/29 0818) SpO2:  [97 %-100 %] 97 % (06/29 0818) Weight:  [44.9 kg] 44.9 kg (06/28 0947)  Intake/Output from previous day: 06/28 0701 - 06/29 0700 In: 1571.6 [I.V.:1121.6; IV Piggyback:450] Out: 50 [Blood:50] Intake/Output this shift: No intake/output data recorded.  Exam:  No gross blood or drainage overlying the dressing 2+ radial pulse Sensation diminished throughout the right upper extremity.  Able to wiggle her fingers.  Deltoid not firing yet due to block.   Labs: No results for input(s): HGB in the last 72 hours. No results for input(s): WBC, RBC, HCT, PLT in the last 72 hours. No results for input(s): NA, K, CL, CO2, BUN, CREATININE, GLUCOSE, CALCIUM in the last 72 hours. No results for input(s): LABPT, INR in the last 72 hours.  Assessment/Plan: Pt is POD 1 s/p right rotator cuff repair  -Plan to discharge to home today   -No lifting with the operative arm     Gerrianne Scale Musa Rewerts 02/09/2021, 9:24 AM

## 2021-02-09 NOTE — Progress Notes (Signed)
PT Cancellation Note  Patient Details Name: Haley Wade MRN: [id number] DOB: [birth year]   Cancelled Treatment:    Reason Eval/Treat Not Completed: PT screened, no needs identified, will sign off. Per OT, pt with no need for skilled PT services. PT SIGNING OFF. Please re-consult if needed in future.  Kittie Plater, PT, DPT Acute Rehabilitation Services Pager #: [phone] Office #: [phone]    [name] 02/09/2021, 10:26 AM

## 2021-02-09 NOTE — Op Note (Signed)
NAME: Haley Wade, Haley Wade MEDICAL RECORD NO: [id number] ACCOUNT NO: [id number] DATE OF BIRTH: 1946/07/15 FACILITY: [facility] LOCATION: [facility] PHYSICIAN: Yetta Barre. Marlou Sa, MD  Operative Report   DATE OF PROCEDURE: 02/08/2021  PREOPERATIVE DIAGNOSIS:  Right shoulder biceps tendinitis and rotator cuff tear.  POSTOPERATIVE DIAGNOSIS:  Right shoulder biceps tendinitis and rotator cuff tear.  PROCEDURE:  Right shoulder arthroscopy with superior labral debridement, biceps tendon release, mini open rotator cuff tear repair of the upper border of the subscapularis as well as supraspinatus and infraspinatus along with open biceps tenodesis.  SURGEON:  Meredith Pel, MD  ASSISTANT:  Annie Main, PA  INDICATIONS:  The patient is a 75 year old patient with right shoulder pain and known rotator cuff pathology.  She presents for operative management after explanation of risks and benefits.  PROCEDURE IN DETAIL:  The patient was brought to the operating room where general endotracheal anesthesia was induced.  Preoperative antibiotics were administered.  Timeout was called.  Right shoulder was examined under anesthesia and found to have full  range of motion with good stability.  Right arm, shoulder, and hand were prescrubbed with alcohol and Betadine, allowed to air dry, prepped with DuraPrep solution, and draped in usual sterile manner.  The patient was placed in the beach chair with the  head in neutral position.  After sterile prepping and draping, the shoulder field was sealed with Ioban including the axilla.  Posterior portal was created 2 cm medial and inferior to the posterolateral margin of the acromion after timeout called.   Anterior portal was created under direct visualization.  The patient had intact glenohumeral articular surfaces.  Had a large retracted tear of the supraspinatus and infraspinatus.  Biceps tendinitis and partial tearing were present.  Superior labral  debridement was performed  through the anterior portal using the Arthrocare wand.  The anterior inferior, posterior inferior glenohumeral ligaments were intact.  Next, instruments were removed. Portals were closed using 3-0 nylon.  Ioban then used to  cover the entire operative field.  Incision was made off the anterolateral margin of the acromion.  Skin and subcutaneous tissue were sharply divided.  Deltoid was split between the middle and anterior raphae and measured distance of 4 cm, marked with a  #1 Vicryl suture.  Bursectomy was performed.  Biceps tenodesis was then performed using 2 knotless Arthrex SutureTaks.  Upper border of the subscap also had some fraying and degeneration.  This was debrided and repaired also with a knotless SutureTak  placed in mattress fashion.  Good repair was achieved with that.  Next, attention was directed towards the rotator cuff tear of the infraspinatus and supraspinatus.  Edges were debrided back to normal-appearing tissue.  Footprint was prepared.  Margin  convergence sutures were placed x6 using 2-0 FiberWire suture.  Two Arthrex SutureTape anchors were placed at the intersection of the articular surface and medial aspect of the greater tuberosity.  These 8 SutureTapes were then passed after margin  convergence was completed.  Two 0 Vicryl sutures were also placed to facilitate margin convergence.  With the rotator cuff tear reduced back to the footprint, the SutureTapes were tied and crossed to form a crossing pattern.  The converging sutures,  which were 2-0 FiberWire x6, were then combined with two 0 Vicryl sutures and placed into 1 SwiveLock.  Initial SwiveLock was 4.75, but bone quality was not sufficient.  Good fixation was achieved with a 6.25 SwiveLock.  Anterior and posterior SwiveLocks  for the SutureTapes were then placed  with very good fixation achieved.  Subacromial decompression with acromioplasty also performed.  Watertight repair was achieved.  Thorough irrigation was performed.   Deltoid split was closed using 0 Vicryl suture  followed by interrupted inverted 2-0 Vicryl suture and 3-0 Monocryl with Steri-Strips.  An impervious dressing was applied.  Shoulder sling was applied.  Luke's assistance was required at all times for retraction, opening, closing, mobilization of tissue.  His assistance was a medical necessity.   ROH D: 02/08/2021 1:49:58 pm T: 02/08/2021 3:51:00 pm  JOB: [id number]/ [id number]

## 2021-02-09 NOTE — Progress Notes (Signed)
Discharge summary packet provided to pt by SWOT team staff nurse. Per swot staff nurse pt has been instructed. Pt d/c to home as ordered. Per pt a friend of hers would be responsible for her ride.Pt in no acute distress.

## 2021-02-10 ENCOUNTER — Encounter (HOSPITAL_COMMUNITY): Payer: Self-pay | Admitting: Orthopedic Surgery

## 2021-02-11 ENCOUNTER — Telehealth: Payer: Self-pay

## 2021-02-11 MED ORDER — CYCLOBENZAPRINE HCL 5 MG PO TABS: 10.0000 mg | ORAL_TABLET | Freq: Three times a day (TID) | ORAL | 0 refills | Status: DC | PRN

## 2021-02-11 NOTE — Telephone Encounter (Signed)
Pt called stating that the pain medication is keeping her up.  She stated that the muscle relaxer isn't helping either and she had been proscribed flexeril before and that helped so she was wondering if she can get that called in ?

## 2021-02-11 NOTE — Telephone Encounter (Signed)
Ok for flex 5 po q 8 prn and tramadol 1 po q 6 30 each thx

## 2021-02-12 ENCOUNTER — Other Ambulatory Visit: Payer: Self-pay | Admitting: Orthopedic Surgery

## 2021-02-12 MED ORDER — HYDROCODONE-ACETAMINOPHEN 5-325 MG PO TABS: 1.0000 | ORAL_TABLET | Freq: Four times a day (QID) | ORAL | 0 refills | Status: DC | PRN

## 2021-02-12 NOTE — Telephone Encounter (Signed)
Sent in sat am

## 2021-02-16 ENCOUNTER — Ambulatory Visit (INDEPENDENT_AMBULATORY_CARE_PROVIDER_SITE_OTHER): Payer: Medicare Other | Admitting: Orthopedic Surgery

## 2021-02-16 ENCOUNTER — Encounter: Payer: Self-pay | Admitting: Orthopedic Surgery

## 2021-02-16 DIAGNOSIS — M75121 Complete rotator cuff tear or rupture of right shoulder, not specified as traumatic: Secondary | ICD-10-CM

## 2021-02-16 NOTE — Telephone Encounter (Signed)
noted 

## 2021-02-16 NOTE — Progress Notes (Signed)
Post-Op Visit Note   Patient: Haley Wade           Date of Birth: [birth year]           MRN: [id number] Visit Date: 02/16/2021 PCP: Shon Baton, MD   Assessment & Plan:  Chief Complaint:  Chief Complaint  Patient presents with   Right Shoulder - Routine Post Op   Visit Diagnoses:  1. Nontraumatic complete tear of right rotator cuff     Plan: Jaelene is a patient is now a week out right shoulder massive rotator cuff tear repair infraspinatus and supraspinatus.  She also had biceps tenodesis.  On exam she is got pretty reasonable passive range of motion but it is slightly more limited than I would like to see at 1 week's time.  She is has been using the CPM machine but she states is painful.  I would like for her to continue to use the CPM machine is much as possible.  She is going to discontinue this machine in about a week.  Sutures DC'd today.  3-week return.  Discontinue sling next week but no lifting with the right arm out away from her body.  Start physical therapy next week for passive range of motion and active assisted range of motion.  We did achieve a watertight repair.  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  Follow-Up Instructions: Return in about 3 weeks (around 03/09/2021).   Orders:  No orders of the defined types were placed in this encounter.  No orders of the defined types were placed in this encounter.   Imaging: No results found.  PMFS History: Patient Active Problem List   Diagnosis Date Noted   S/P rotator cuff repair 02/08/2021   Chest discomfort 01/26/2014   Palpitations 01/26/2014   Cellulitis 09/08/2013   Abdominal pain, left mid abdomen, chronic 02/26/2013   Hyponatremia 10/30/2012   COPD, questioned 01/02/2012   Allergic rhinitis due to pollen 10/20/2010   MITRAL VALVE PROLAPSE, HX OF 05/26/2009   UNSPECIFIED HEARING LOSS 05/21/2009   RHINOSINUSITIS, CHRONIC  05/21/2009   Past Medical History:  Diagnosis Date   Allergic rhinitis, cause unspecified    Anemia    Arthritis    Cellulitis    Chest discomfort    COPD, questioned    GAVE (gastric antral vascular ectasia)    Hyponatremia    MITRAL VALVE PROLAPSE, HX OF    Other chronic sinusitis    Palpitations    Syncope    Unspecified hearing loss     Family History  Problem Relation Age of Onset   Other Mother        c difficile gastroenteritis   Lung cancer Father        smoker   Liver disease Brother    Liver cancer Brother    Asthma Other        grandmother   Other Other        bronchitis-grandmother    Past Surgical History:  Procedure Laterality Date   APPENDECTOMY     BACK SURGERY     BICEPT TENODESIS Right 02/08/2021   Procedure: BICEPS TENODESIS;  Surgeon: Meredith Pel, MD;  Location: [facility];  Service: Orthopedics;  Laterality: Right;   BLADDER SURGERY     BOWEL RESECTION N/A 11/01/2012   Procedure: SMALL BOWEL RESECTION;  Surgeon: Earnstine Regal, MD;  Location: [facility];  Service: General;  Laterality: N/A;   BREAST ENHANCEMENT SURGERY     revisions   BREAST SURGERY     CHOLECYSTECTOMY     COLON SURGERY     ESOPHAGOGASTRODUODENOSCOPY (EGD) WITH PROPOFOL N/A 10/14/2019   Procedure: ESOPHAGOGASTRODUODENOSCOPY (EGD) WITH PROPOFOL;  Surgeon: Clarene Essex, MD;  Location: [facility];  Service: Endoscopy;  Laterality: N/A;   EYE SURGERY     GI RADIOFREQUENCY ABLATION  10/14/2019   Procedure: GI RADIOFREQUENCY ABLATION;  Surgeon: Clarene Essex, MD;  Location: [facility];  Service: Endoscopy;;   LAPAROSCOPY N/A 11/01/2012   Procedure: LAPAROSCOPY DIAGNOSTIC;  Surgeon: Earnstine Regal, MD;  Location: [facility];  Service: General;  Laterality: N/A;   LAPAROTOMY N/A 11/01/2012   Procedure: EXPLORATORY LAPAROTOMY;  Surgeon: Earnstine Regal, MD;  Location: [facility];  Service: General;  Laterality: N/A;   LYSIS OF ADHESION N/A 11/01/2012   Procedure: LYSIS OF ADHESION;  Surgeon: Earnstine Regal, MD;  Location: [facility];  Service: General;  Laterality: N/A;   RHINOPLASTY     SHOULDER ARTHROSCOPY WITH OPEN ROTATOR CUFF REPAIR AND DISTAL CLAVICLE ACROMINECTOMY Right 02/08/2021   Procedure: SHOULDER ARTHROSCOPY WITH BICEPS TENDON RELEASE, MINI OPEN ROTATOR CUFF TEAR REPAIR OF THE INFRASPINATUS SUPRASPINATUS AND UPPER PORTIION OF THE SUBSCAP  WITH BICEPS TENODESIS;  Surgeon: Meredith Pel, MD;  Location: [place] [facility];  Service: Orthopedics;  Laterality: Right;   TONSILLECTOMY     TOTAL ABDOMINAL HYSTERECTOMY     Social History   Occupational History   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

## 2021-02-16 NOTE — Telephone Encounter (Signed)
I called.

## 2021-02-19 DIAGNOSIS — M7521 Bicipital tendinitis, right shoulder: Secondary | ICD-10-CM

## 2021-02-19 DIAGNOSIS — M75121 Complete rotator cuff tear or rupture of right shoulder, not specified as traumatic: Secondary | ICD-10-CM

## 2021-02-22 DIAGNOSIS — S43421A Sprain of right rotator cuff capsule, initial encounter: Secondary | ICD-10-CM | POA: Diagnosis not present

## 2021-02-23 NOTE — Discharge Summary (Signed)
Physician Discharge Summary      Patient ID: Haley Wade MRN: [id number] DOB/AGE: 1946/01/19 75 y.o.  Admit date: 02/08/2021 Discharge date: 02/09/2021.  Admission Diagnoses:  Active Problems:   S/P rotator cuff repair   Complete tear of right rotator cuff   Biceps tendonitis on right   Discharge Diagnoses:  Same  Surgeries: Procedure(s): SHOULDER ARTHROSCOPY WITH BICEPS TENDON RELEASE, MINI OPEN ROTATOR CUFF TEAR REPAIR OF THE INFRASPINATUS SUPRASPINATUS AND UPPER PORTIION OF THE SUBSCAP  WITH BICEPS TENODESIS BICEPS TENODESIS on 02/08/2021   Consultants:   Discharged Condition: Stable  Hospital Course: Haley Wade is an 75 y.o. female who was admitted 02/08/2021 with a chief complaint of right shoulder pain, and found to have a diagnosis of right shoulder rotator cuff tear.  They were brought to the operating room on 02/08/2021 and underwent the above named procedures.  Pt awoke from anesthesia without complication and was transferred to the floor. On POD1, patient's pain was controlled with interscalene block still in effect.  She was able to ambulate without any lightheadedness or dizziness.  She was discharged home on POD 1..  Pt will f/u with Dr. Marlou Sa in clinic in ~7 to 10 days.   Antibiotics given:  Anti-infectives (From admission, onward)    Start     Dose/Rate Route Frequency Ordered Stop   02/08/21 2000  ceFAZolin (ANCEF) IVPB 1 g/50 mL premix  Status:  Discontinued        1 g 100 mL/hr over 30 Minutes Intravenous Every 8 hours 02/08/21 1528 02/09/21 1643   02/08/21 0900  ceFAZolin (ANCEF) IVPB 2g/100 mL premix        2 g 200 mL/hr over 30 Minutes Intravenous On call to O.R. 02/08/21 0853 02/08/21 1110   02/08/21 0859  ceFAZolin (ANCEF) 2-4 GM/100ML-% IVPB       Note to Pharmacy: Alvy Beal   : cabinet override      02/08/21 0859 02/08/21 1121     .  Recent vital signs:  Vitals:   02/09/21 0004 02/09/21 0818  BP: (!) 105/53 (!) 106/52  Pulse:  67 79  Resp: 20 20  Temp: 98 F (36.7 C) 98.5 F (36.9 C)  SpO2: 97% 97%    Recent laboratory studies:  Results for orders placed or performed during the hospital encounter of 02/04/21  SARS CORONAVIRUS 2 (TAT 6-24 HRS) Nasopharyngeal Nasopharyngeal Swab   Specimen: Nasopharyngeal Swab  Result Value Ref Range   SARS Coronavirus 2 NEGATIVE NEGATIVE    Discharge Medications:   Allergies as of 02/09/2021       Reactions   Bee Venom Swelling   Terconazole Nausea Only   Headaches and nausea   Plaquenil [hydroxychloroquine Sulfate] Rash        Medication List     STOP taking these medications    zaleplon 10 MG capsule Commonly known as: SONATA       TAKE these medications    conjugated estrogens vaginal cream Commonly known as: PREMARIN Place 0.5 g vaginally every 7 (seven) days.   estrogens (conjugated) 0.625 MG tablet Commonly known as: PREMARIN Take 0.625 mg by mouth daily.   finasteride 5 MG tablet Commonly known as: PROSCAR Take 5 mg by mouth daily.   fluticasone 50 MCG/ACT nasal spray Commonly known as: FLONASE Place 2 sprays into both nostrils daily as needed for allergies or rhinitis.   hydrocodone-ibuprofen 5-200 MG tablet Commonly known as: VICOPROFEN 1 po q 4-6hrs prn pain   ibuprofen  800 MG tablet Commonly known as: ADVIL Take 800 mg by mouth every 8 (eight) hours as needed for moderate pain.   iron polysaccharides 150 MG capsule Commonly known as: NIFEREX Take 150 mg by mouth daily.   LORazepam 0.5 MG tablet Commonly known as: ATIVAN Take 0.5 tablets (0.25 mg total) by mouth in the morning and at bedtime.   methocarbamol 500 MG tablet Commonly known as: ROBAXIN Take 1 tablet (500 mg total) by mouth every 8 (eight) hours as needed for muscle spasms.   metoprolol succinate 25 MG 24 hr tablet Commonly known as: Toprol XL Take 0.5 tablets (12.5 mg total) by mouth daily.   omeprazole 20 MG tablet Commonly known as: PRILOSEC OTC Take  20 mg by mouth daily as needed (acid reflux/indigestion.).   spironolactone 50 MG tablet Commonly known as: ALDACTONE Take 1 tablet (50 mg total) by mouth daily. What changed: when to take this   Systane Ultra 0.4-0.3 % Soln Generic drug: Polyethyl Glycol-Propyl Glycol Place 1 drop into both eyes 3 (three) times daily as needed (dry/irritated eyes.).   URISED PO Take 1 tablet by mouth daily as needed (for bladder spasms).   Vitamin D (Ergocalciferol) 1.25 MG (50000 UNIT) Caps capsule Commonly known as: DRISDOL Take 50,000 Units by mouth every Saturday.   Zovirax 200 MG capsule Generic drug: acyclovir Take 200 mg by mouth daily.        Diagnostic Studies: No results found.  Disposition: Discharge disposition: 01-Home or Self Care       Discharge Instructions     Call MD / Call 911   Complete by: As directed    If you experience chest pain or shortness of breath, CALL 911 and be transported to the hospital emergency room.  If you develope a fever above 101 F, pus (white drainage) or increased drainage or redness at the wound, or calf pain, call your surgeon's office.   Constipation Prevention   Complete by: As directed    Drink plenty of fluids.  Prune juice may be helpful.  You may use a stool softener, such as Colace (over the counter) 100 mg twice a day.  Use MiraLax (over the counter) for constipation as needed.   Diet - low sodium heart healthy   Complete by: As directed    Discharge instructions   Complete by: As directed    Use the CPM machine for one hour tonight.  Starting tomorrow, use the CPM machine 3 times per day for at least one hour each time; increase the degrees of range of motion with each session as you can tolerate.  No lifting with the operative arm or lifting the arm under its own power.  You may shower, dressings are waterproof.  Use the sling at all times, you may take it off for showering and to use the CPM machine.  You may come out of the sling  1-2 times per day to work on straightening your elbow, to avoid elbow stiffness.  Follow-up with Dr. Marlou Sa in the clinic at your given appointment date in ~1 week.  Call the office at [phone] with any questions/concerns or if you do not have a CPM machine.   Increase activity slowly as tolerated   Complete by: As directed    Post-operative opioid taper instructions:   Complete by: As directed    POST-OPERATIVE OPIOID TAPER INSTRUCTIONS: It is important to wean off of your opioid medication as soon as possible. If you do not need pain  medication after your surgery it is ok to stop day one. Opioids include: Codeine, Hydrocodone(Norco, Vicodin), Oxycodone(Percocet, oxycontin) and hydromorphone amongst others.  Long term and even short term use of opiods can cause: Increased pain response Dependence Constipation Depression Respiratory depression And more.  Withdrawal symptoms can include Flu like symptoms Nausea, vomiting And more Techniques to manage these symptoms Hydrate well Eat regular healthy meals Stay active Use relaxation techniques(deep breathing, meditating, yoga) Do Not substitute Alcohol to help with tapering If you have been on opioids for less than two weeks and do not have pain than it is ok to stop all together.  Plan to wean off of opioids This plan should start within one week post op of your joint replacement. Maintain the same interval or time between taking each dose and first decrease the dose.  Cut the total daily intake of opioids by one tablet each day Next start to increase the time between doses. The last dose that should be eliminated is the evening dose.             SignedDonella Stade 02/23/2021, 12:53 PM

## 2021-02-24 DIAGNOSIS — S43421A Sprain of right rotator cuff capsule, initial encounter: Secondary | ICD-10-CM | POA: Diagnosis not present

## 2021-02-28 DIAGNOSIS — S43421A Sprain of right rotator cuff capsule, initial encounter: Secondary | ICD-10-CM | POA: Diagnosis not present

## 2021-03-01 ENCOUNTER — Telehealth: Payer: Self-pay | Admitting: Orthopedic Surgery

## 2021-03-01 NOTE — Telephone Encounter (Signed)
Pt called requesting a call back from Dr. Marlou Sa concerning her pain. Pt states she had surgery 3 wks ago and started physical therapy. She states she is in severe pain and has been taking 2 oxycodone and it is not touching her pains. She is asking a call from the doctor for medical advice. Please call pt at [phone].

## 2021-03-02 ENCOUNTER — Telehealth: Payer: Self-pay | Admitting: Orthopedic Surgery

## 2021-03-02 NOTE — Telephone Encounter (Signed)
This is Dr Randel Pigg patient

## 2021-03-02 NOTE — Telephone Encounter (Signed)
Pt is scheduled for tomorrow 

## 2021-03-02 NOTE — Telephone Encounter (Signed)
Patient called advised she went to PT and had deep tissue massage. Patient said she was in pain and took flexeril. Patient said the pain kept getting worse. Patient said she stopped PT after the pain kept getting worse. Patient said she started taking Ibuprofen and Flexeril. Patient asked what is her next plan of care since she stopped taking (PT) Patient did not feel comfortable at (PT)   The number to contact patient is [phone]

## 2021-03-02 NOTE — Telephone Encounter (Signed)
Pls have her come in fri am sometime thx I called pt issues possibly with deep tissue massage

## 2021-03-03 ENCOUNTER — Other Ambulatory Visit: Payer: Self-pay

## 2021-03-03 ENCOUNTER — Ambulatory Visit (INDEPENDENT_AMBULATORY_CARE_PROVIDER_SITE_OTHER): Payer: Medicare Other | Admitting: Orthopedic Surgery

## 2021-03-03 DIAGNOSIS — M75121 Complete rotator cuff tear or rupture of right shoulder, not specified as traumatic: Secondary | ICD-10-CM

## 2021-03-04 ENCOUNTER — Ambulatory Visit (INDEPENDENT_AMBULATORY_CARE_PROVIDER_SITE_OTHER): Payer: Medicare Other | Admitting: Rehabilitative and Restorative Service Providers"

## 2021-03-04 ENCOUNTER — Encounter: Payer: Self-pay | Admitting: Rehabilitative and Restorative Service Providers"

## 2021-03-04 DIAGNOSIS — M25611 Stiffness of right shoulder, not elsewhere classified: Secondary | ICD-10-CM | POA: Diagnosis not present

## 2021-03-04 DIAGNOSIS — G8929 Other chronic pain: Secondary | ICD-10-CM

## 2021-03-04 DIAGNOSIS — R600 Localized edema: Secondary | ICD-10-CM

## 2021-03-04 DIAGNOSIS — R293 Abnormal posture: Secondary | ICD-10-CM

## 2021-03-04 DIAGNOSIS — M25511 Pain in right shoulder: Secondary | ICD-10-CM | POA: Diagnosis not present

## 2021-03-04 DIAGNOSIS — M6281 Muscle weakness (generalized): Secondary | ICD-10-CM | POA: Diagnosis not present

## 2021-03-04 NOTE — Patient Instructions (Signed)
Access Code: [email or web address] URL: [email or web address] Date: 03/04/2021 Prepared by: Scot Jun  Exercises Supine Shoulder Flexion AAROM with Hands Clasped - 3 x daily - 7 x weekly - 2 sets - 10 reps - 5 hold Supine Shoulder External Rotation with [place] (Mirrored) - 3 x daily - 7 x weekly - 2 sets - 10 reps - 5 hold Seated Scapular Retraction - 2 x daily - 7 x weekly - 2 sets - 10 reps - 5 hold

## 2021-03-04 NOTE — Therapy (Signed)
[facility] [address] [place], [place], [address] Phone: [phone]   Fax:  [phone]  Physical Therapy Evaluation  Patient Details  Name: Haley Wade MRN: [id number] Date of Birth: 1946/06/09 Referring Provider (PT): Dr. Meredith Pel   Encounter Date: 03/04/2021   PT End of Session - 03/04/21 0933     Visit Number 1    Number of Visits 20    Date for PT Re-Evaluation 05/13/21    Authorization Type Medicare, AARP    Progress Note Due on Visit 10    PT Start Time 0934    PT Stop Time 1020    PT Time Calculation (min) 46 min    Activity Tolerance Patient tolerated treatment well    Behavior During Therapy [facility] for tasks assessed/performed             Past Medical History:  Diagnosis Date   Allergic rhinitis, cause unspecified    Anemia    Arthritis    Cellulitis    Chest discomfort    COPD, questioned    GAVE (gastric antral vascular ectasia)    Hyponatremia    MITRAL VALVE PROLAPSE, HX OF    Other chronic sinusitis    Palpitations    Syncope    Unspecified hearing loss     Past Surgical History:  Procedure Laterality Date   APPENDECTOMY     BACK SURGERY     BICEPT TENODESIS Right 02/08/2021   Procedure: BICEPS TENODESIS;  Surgeon: Meredith Pel, MD;  Location: [place];  Service: Orthopedics;  Laterality: Right;   BLADDER SURGERY     BOWEL RESECTION N/A 11/01/2012   Procedure: SMALL BOWEL RESECTION;  Surgeon: Earnstine Regal, MD;  Location: [facility];  Service: General;  Laterality: N/A;   BREAST ENHANCEMENT SURGERY     revisions   BREAST SURGERY     CHOLECYSTECTOMY     COLON SURGERY     ESOPHAGOGASTRODUODENOSCOPY (EGD) WITH PROPOFOL N/A 10/14/2019   Procedure: ESOPHAGOGASTRODUODENOSCOPY (EGD) WITH PROPOFOL;  Surgeon: Clarene Essex, MD;  Location: [facility];  Service: Endoscopy;  Laterality: N/A;   EYE SURGERY     GI RADIOFREQUENCY ABLATION  10/14/2019   Procedure: GI RADIOFREQUENCY ABLATION;   Surgeon: Clarene Essex, MD;  Location: [facility];  Service: Endoscopy;;   LAPAROSCOPY N/A 11/01/2012   Procedure: LAPAROSCOPY DIAGNOSTIC;  Surgeon: Earnstine Regal, MD;  Location: [facility];  Service: General;  Laterality: N/A;   LAPAROTOMY N/A 11/01/2012   Procedure: EXPLORATORY LAPAROTOMY;  Surgeon: Earnstine Regal, MD;  Location: [facility];  Service: General;  Laterality: N/A;   LYSIS OF ADHESION N/A 11/01/2012   Procedure: LYSIS OF ADHESION;  Surgeon: Earnstine Regal, MD;  Location: [facility];  Service: General;  Laterality: N/A;   RHINOPLASTY     SHOULDER ARTHROSCOPY WITH OPEN ROTATOR CUFF REPAIR AND DISTAL CLAVICLE ACROMINECTOMY Right 02/08/2021   Procedure: SHOULDER ARTHROSCOPY WITH BICEPS TENDON RELEASE, MINI OPEN ROTATOR CUFF TEAR REPAIR OF THE INFRASPINATUS SUPRASPINATUS AND UPPER PORTIION OF THE SUBSCAP  WITH BICEPS TENODESIS;  Surgeon: Meredith Pel, MD;  Location: [facility];  Service: Orthopedics;  Laterality: Right;   TONSILLECTOMY     TOTAL ABDOMINAL HYSTERECTOMY      There were no vitals filed for this visit.    Subjective Assessment - 03/04/21 0937     Subjective Pt. had Rt shoulder surgery performed on 02/08/2021 for "massive infraspinatus/supraspinatus tear."  Pt. indicated trouble for years prior to surgery was performed.  Worsened since Jan 2022. Pt. indicated he started physical therapy at another place and had pressure massage on front of shoulder that resulted in severe pain afterward.  Switched over to this clinic for today.  Pt. indicated arm feels tight.  Has CPM machine still at this time.  Sleeping on loveseat at this time.  Pt. indicated she had a sling until last week but reported Dr. Marlou Sa said no sling on the visit this week.    Limitations House hold activities;Lifting    Patient Stated Goals Reduce pain, improve arm movement.    Currently in Pain? Yes    Pain Score 7    pain at worst 10/10   Pain Location Shoulder    Pain Orientation Right    Pain Descriptors /  Indicators Aching;Sore;Constant;Throbbing;Shooting;Stabbing    Pain Type Chronic pain;Surgical pain    Pain Onset More than a month ago    Pain Frequency Constant    Aggravating Factors  arm movmeent, pain noted at rest as well, some trouble sleeping                [facility] PT Assessment - 03/04/21 0001       Assessment   Medical Diagnosis Rt RTC repair    Referring Provider (PT) Dr. Meredith Pel    Onset Date/Surgical Date 08/14/20    Hand Dominance Right      Precautions   Precautions Shoulder    Precaution Comments PROM only      Balance Screen   Has the patient fallen in the past 6 months Yes    How many times? 1    Has the patient had a decrease in activity level because of a fear of falling?  No    Is the patient reluctant to leave their home because of a fear of falling?  No      Home Ec[employer] residence      Prior Function   Vocation Retired    Leisure Walking, [employer], [employer], spin classes (limited at this time)      Cognition   Overall Cognitive Status Within Functional Limits for tasks assessed      Observation/Other Assessments   Observations Localized edema Rt shoulder    Focus on Therapeutic Outcomes (FOTO)  intake 38%, predicted 58%      Sensation   Light Touch Appears Intact      Posture/Postural Control   Posture/Postural Control Postural limitations    Postural Limitations Rounded Shoulders;Forward head;Increased thoracic kyphosis      ROM / Strength   AROM / PROM / Strength PROM;Strength;AROM      AROM   Overall AROM Comments No AROM testing on Rt shoulder per referral protocol    AROM Assessment Site Shoulder    Right/Left Shoulder Left    Left Shoulder Flexion 155 Degrees    Left Shoulder ABduction 155 Degrees    Left Shoulder Internal Rotation 62 Degrees    Left Shoulder External Rotation 95 Degrees      PROM   Overall PROM Comments Limited by capsular tightness and pain  response for Rt shoulder    PROM Assessment Site Shoulder    Right/Left Shoulder Right;Left    Right Shoulder Flexion 70 Degrees   in supine   Right Shoulder ABduction 70 Degrees   in supine   Right Shoulder Internal Rotation 30 Degrees   in 30 degrees abduction in supine   Right Shoulder External Rotation 10 Degrees  in 30 degrees abduction in supine     Strength   Overall Strength Comments No strength testing on Rt shoulder due to surgical protocol at this time    Strength Assessment Site Shoulder;Elbow    Right/Left Shoulder Left;Right    Left Shoulder Flexion 5/5    Left Shoulder ABduction 5/5    Left Shoulder Internal Rotation 5/5    Left Shoulder External Rotation 5/5    Right/Left Elbow Left;Right    Left Elbow Flexion 5/5    Left Elbow Extension 5/5      Palpation   Palpation comment Tenderness in anterior deltoid, middle deltoid, infraspinatus, supraspinatus palpation.  Rt glenohumeral joint tightness in all directions in passive accessory movement.                        Objective measurements completed on examination: See above findings.       [facility] Adult PT Treatment/Exercise - 03/04/21 0001       Exercises   Exercises Other Exercises;Shoulder    Other Exercises  HEP instruction/performance c cues for techniques, handout provided.  Trial set performed of each for comprehension and symptom assessment.  Consisting of supine Lt arm assisted PROM flexion, seated retraction hold 5 sec, supine ER wand stretch in 30-45 degree abduction      Manual Therapy   Manual therapy comments G2-g3 inferior joints mobs, posterior joint mobs to Rt glenohumeral joint                    PT Education - 03/04/21 0933     Education Details HEP, POC    Person(s) Educated Patient    Methods Explanation;Demonstration;Verbal cues;Handout    Comprehension Returned demonstration;Verbalized understanding              PT Short Term Goals - 03/04/21 0933        PT SHORT TERM GOAL #1   Title Patient will demonstrate independent use of home exercise program to maintain progress from in clinic treatments.    Time 3    Period Weeks    Status New    Target Date 03/25/21               PT Long Term Goals - 03/04/21 0933       PT LONG TERM GOAL #1   Title Patient will demonstrate/report pain at worst less than or equal to 2/10 to facilitate minimal limitation in daily activity secondary to pain symptoms.    Time 10    Period Weeks    Status New    Target Date 05/13/21      PT LONG TERM GOAL #2   Title Patient will demonstrate independent use of home exercise program to facilitate ability to maintain/progress functional gains from skilled physical therapy services.    Time 10    Period Weeks    Status New    Target Date 05/13/21      PT LONG TERM GOAL #3   Title Pt. will demonstrate FOTO outcome > or = 58% to indicated reduced disability due to condition.    Time 10    Period Weeks    Status New    Target Date 05/13/21      PT LONG TERM GOAL #4   Title Patient will demonstrate Rt [place] joint mobility with 15% of Lt to facilitate usual self care, dressing, reaching overhead at PLOF s limitation due to symptoms.    Time 10  Period Weeks    Status New    Target Date 05/13/21      PT LONG TERM GOAL #5   Title Patient will demonstrate Rt UE MMT > or = 4/5  throughout to facilitate usual lifting, carrying in functional activity to PLOF s limitation.    Time 10    Period Weeks    Status New    Target Date 05/13/21                    Plan - 03/04/21 1020     Clinical Impression Statement Patient is a 75 y.o. who comes to clinic with complaints of Rt shoulder pain s/p recent rotator cuff repair surgery on 02/08/2021 with mobility, strength and movement coordination deficits that impair their ability to perform usual daily and recreational functional activities without increase difficulty/symptoms at this time.  Patient  to benefit from skilled PT services to address impairments and limitations to improve to previous level of function without restriction secondary to condition.    Examination-Activity Limitations Reach Overhead;Caring for Others;Carry;Dressing;Lift;Sleep    Examination-Participation Restrictions Cleaning;Community Activity;Driving;Interpersonal Relationship;Occupation    Stability/Clinical Decision Making Stable/Uncomplicated    Clinical Decision Making Low    Rehab Potential --   fair to good   PT Frequency 2x / week    PT Duration Other (comment)   10 weeks   PT Treatment/Interventions ADLs/Self Care Home Management;Cryotherapy;Electrical Stimulation;Therapeutic exercise;Iontophoresis '4mg'$ /ml Dexamethasone;Moist Heat;Traction;Balance training;Therapeutic activities;Functional mobility training;Stair training;Gait training;DME Instruction;Ultrasound;Neuromuscular re-education;Patient/family education;Passive range of motion;Spinal Manipulations;Joint Manipulations;Dry needling;Taping;Vasopneumatic Device;Manual techniques    PT Next Visit Plan Rt shoulder joint mobilizations for ROM gains, PROM only per MD protocol at this time.    PT Home Exercise Plan [email or web address]    Consulted and Agree with Plan of Care Patient             Patient will benefit from skilled therapeutic intervention in order to improve the following deficits and impairments:  Decreased endurance, Hypomobility, Increased edema, Decreased activity tolerance, Decreased strength, Impaired UE functional use, Pain, Decreased mobility, Impaired perceived functional ability, Improper body mechanics, Postural dysfunction, Impaired flexibility, Decreased coordination, Decreased range of motion  Visit Diagnosis: Chronic right shoulder pain  Stiffness of right shoulder, not elsewhere classified  Muscle weakness (generalized)  Abnormal posture  Localized edema     Problem List Patient Active Problem List   Diagnosis Date Noted    Complete tear of right rotator cuff    Biceps tendonitis on right    S/P rotator cuff repair 02/08/2021   Chest discomfort 01/26/2014   Palpitations 01/26/2014   Cellulitis 09/08/2013   Abdominal pain, left mid abdomen, chronic 02/26/2013   Hyponatremia 10/30/2012   COPD, questioned 01/02/2012   Allergic rhinitis due to pollen 10/20/2010   MITRAL VALVE PROLAPSE, HX OF 05/26/2009   UNSPECIFIED HEARING LOSS 05/21/2009   RHINOSINUSITIS, 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 [place], [place], [address] Phone: [phone]   Fax:  [phone]  Name: Haley Wade MRN: [id number] Date of Birth: 12-08-1945

## 2021-03-05 ENCOUNTER — Encounter: Payer: Self-pay | Admitting: Orthopedic Surgery

## 2021-03-05 NOTE — Progress Notes (Signed)
Post-Op Visit Note   Patient: Haley Wade           Date of Birth: 1945/11/21           MRN: [id number] Visit Date: 03/03/2021 PCP: Shon Baton, MD   Assessment & Plan:  Chief Complaint:  Chief Complaint  Patient presents with   Right Shoulder - Follow-up   Visit Diagnoses:  1. Nontraumatic complete tear of right rotator cuff     Plan: [name] is a 75 year old patient who underwent right shoulder rotator cuff repair of a massive tear of the infraspinatus and supraspinatus about 3 weeks ago.  Reports some increased pain after a manual massage physical therapy session.  She has been taking Norco ibuprofen and Flexeril.  On examination she has some stiffness with range of motion which is predictable after this type of surgery.  I would like to discontinue the sling and have her start physical therapy here for passive range of motion.  Continue with CPM as well at least 3 hours a day to maintain motion in the shoulder.  No indication that there is been any type of rotator cuff repair failure.  Her bone quality was marginal at the time of surgery but I do not think there has been any type of hardware failure at this time based on exam.  Follow-up in 3 weeks  Follow-Up Instructions: Return in about 3 weeks (around 03/24/2021).   Orders:  Orders Placed This Encounter  Procedures   Ambulatory referral to Physical Therapy   No orders of the defined types were placed in this encounter.   Imaging: No results found.  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   Past Medical History:  Diagnosis Date   Allergic rhinitis, cause unspecified    Anemia    Arthritis    Cellulitis    Chest discomfort    COPD, questioned    GAVE (gastric antral vascular ectasia)    Hyponatremia    MITRAL VALVE PROLAPSE, HX OF    Other chronic sinusitis    Palpitations    Syncope    Unspecified hearing loss     Family History  Problem Relation Age of Onset   Other Mother        c difficile gastroenteritis   Lung cancer Father        smoker   Liver disease Brother    Liver cancer Brother    Asthma Other        grandmother   Other Other        bronchitis-grandmother    Past Surgical History:  Procedure Laterality Date   APPENDECTOMY     BACK SURGERY     BICEPT TENODESIS Right 02/08/2021   Procedure: BICEPS TENODESIS;  Surgeon: Meredith Pel, MD;  Location: [facility];  Service: Orthopedics;  Laterality: Right;   BLADDER SURGERY     BOWEL RESECTION N/A 11/01/2012   Procedure: SMALL BOWEL RESECTION;  Surgeon: Earnstine Regal, MD;  Location: [facility];  Service: General;  Laterality: N/A;   BREAST ENHANCEMENT SURGERY     revisions   BREAST SURGERY     CHOLECYSTECTOMY     COLON SURGERY     ESOPHAGOGASTRODUODENOSCOPY (EGD) WITH PROPOFOL N/A 10/14/2019   Procedure: ESOPHAGOGASTRODUODENOSCOPY (EGD) WITH PROPOFOL;  Surgeon: Clarene Essex, MD;  [facility];  Service: Endoscopy;  Laterality: N/A;   EYE SURGERY     GI RADIOFREQUENCY ABLATION  10/14/2019   Procedure: GI RADIOFREQUENCY ABLATION;  Surgeon: Clarene Essex, MD;  [facility];  Service: Endoscopy;;   LAPAROSCOPY N/A 11/01/2012   Procedure: LAPAROSCOPY DIAGNOSTIC;  Surgeon: Earnstine Regal, MD;  [facility];  Service: General;  Laterality: N/A;   LAPAROTOMY N/A 11/01/2012   Procedure: EXPLORATORY LAPAROTOMY;  Surgeon: Earnstine Regal, MD;  [facility];  Service: General;  Laterality: N/A;   LYSIS OF ADHESION  N/A 11/01/2012   Procedure: LYSIS OF ADHESION;  Surgeon: Earnstine Regal, MD;  [facility];  Service: General;  Laterality: N/A;   RHINOPLASTY     SHOULDER ARTHROSCOPY WITH OPEN ROTATOR CUFF REPAIR AND DISTAL CLAVICLE ACROMINECTOMY Right 02/08/2021   Procedure: SHOULDER ARTHROSCOPY WITH BICEPS TENDON RELEASE, MINI OPEN ROTATOR CUFF TEAR REPAIR OF THE INFRASPINATUS SUPRASPINATUS AND UPPER PORTIION OF THE SUBSCAP  WITH BICEPS TENODESIS;  Surgeon: Meredith Pel, MD;  Location: [facility];  Service: Orthopedics;  Laterality: Right;   TONSILLECTOMY     TOTAL ABDOMINAL HYSTERECTOMY     Social History   Occupational History   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

## 2021-03-07 ENCOUNTER — Encounter: Payer: Medicare Other | Admitting: Orthopedic Surgery

## 2021-03-08 ENCOUNTER — Other Ambulatory Visit: Payer: Self-pay

## 2021-03-08 ENCOUNTER — Ambulatory Visit (INDEPENDENT_AMBULATORY_CARE_PROVIDER_SITE_OTHER): Payer: Medicare Other | Admitting: Rehabilitative and Restorative Service Providers"

## 2021-03-08 ENCOUNTER — Encounter: Payer: Self-pay | Admitting: Rehabilitative and Restorative Service Providers"

## 2021-03-08 DIAGNOSIS — R293 Abnormal posture: Secondary | ICD-10-CM | POA: Diagnosis not present

## 2021-03-08 DIAGNOSIS — M6281 Muscle weakness (generalized): Secondary | ICD-10-CM

## 2021-03-08 DIAGNOSIS — R600 Localized edema: Secondary | ICD-10-CM | POA: Diagnosis not present

## 2021-03-08 DIAGNOSIS — M25611 Stiffness of right shoulder, not elsewhere classified: Secondary | ICD-10-CM

## 2021-03-08 DIAGNOSIS — G8929 Other chronic pain: Secondary | ICD-10-CM

## 2021-03-08 DIAGNOSIS — M25511 Pain in right shoulder: Secondary | ICD-10-CM | POA: Diagnosis not present

## 2021-03-08 NOTE — Therapy (Addendum)
[facility] [address] [place], [place], [address] Phone: [phone]   Fax:  [phone]  Physical Therapy Treatment  Patient Details  Name: Haley Wade MRN: [id number] Date of Birth: [birth year] Referring Provider (PT): Dr. Meredith Pel   Encounter Date: 03/08/2021   PT End of Session - 03/08/21 1619     Visit Number 2    Number of Visits 20    Date for PT Re-Evaluation 05/13/21    Authorization Type Medicare, AARP    Progress Note Due on Visit 10    PT Start Time 1555    PT Stop Time 1634    PT Time Calculation (min) 39 min    Activity Tolerance Patient tolerated treatment well    Behavior During Therapy [facility] for tasks assessed/performed             Past Medical History:  Diagnosis Date   Allergic rhinitis, cause unspecified    Anemia    Arthritis    Cellulitis    Chest discomfort    COPD, questioned    GAVE (gastric antral vascular ectasia)    Hyponatremia    MITRAL VALVE PROLAPSE, HX OF    Other chronic sinusitis    Palpitations    Syncope    Unspecified hearing loss     Past Surgical History:  Procedure Laterality Date   APPENDECTOMY     BACK SURGERY     BICEPT TENODESIS Right 02/08/2021   Procedure: BICEPS TENODESIS;  Surgeon: Meredith Pel, MD;  Location: [facility];  Service: Orthopedics;  Laterality: Right;   BLADDER SURGERY     BOWEL RESECTION N/A 11/01/2012   Procedure: SMALL BOWEL RESECTION;  Surgeon: Earnstine Regal, MD;  [facility];  Service: General;  Laterality: N/A;   BREAST ENHANCEMENT SURGERY     revisions   BREAST SURGERY     CHOLECYSTECTOMY     COLON SURGERY     ESOPHAGOGASTRODUODENOSCOPY (EGD) WITH PROPOFOL N/A 10/14/2019   Procedure: ESOPHAGOGASTRODUODENOSCOPY (EGD) WITH PROPOFOL;  Surgeon: Clarene Essex, MD;  [facility];  Service: Endoscopy;  Laterality: N/A;   EYE SURGERY     GI RADIOFREQUENCY ABLATION  10/14/2019   Procedure: GI RADIOFREQUENCY ABLATION;   Surgeon: Clarene Essex, MD;  [facility];  Service: Endoscopy;;   LAPAROSCOPY N/A 11/01/2012   Procedure: LAPAROSCOPY DIAGNOSTIC;  Surgeon: Earnstine Regal, MD;  [facility];  Service: General;  Laterality: N/A;   LAPAROTOMY N/A 11/01/2012   Procedure: EXPLORATORY LAPAROTOMY;  Surgeon: Earnstine Regal, MD;  [facility];  Service: General;  Laterality: N/A;   LYSIS OF ADHESION N/A 11/01/2012   Procedure: LYSIS OF ADHESION;  Surgeon: Earnstine Regal, MD;  [facility];  Service: General;  Laterality: N/A;   RHINOPLASTY     SHOULDER ARTHROSCOPY WITH OPEN ROTATOR CUFF REPAIR AND DISTAL CLAVICLE ACROMINECTOMY Right 02/08/2021   Procedure: SHOULDER ARTHROSCOPY WITH BICEPS TENDON RELEASE, MINI OPEN ROTATOR CUFF TEAR REPAIR OF THE INFRASPINATUS SUPRASPINATUS AND UPPER PORTIION OF THE SUBSCAP  WITH BICEPS TENODESIS;  Surgeon: Meredith Pel, MD;  Location: [facility];  Service: Orthopedics;  Laterality: Right;   TONSILLECTOMY     TOTAL ABDOMINAL HYSTERECTOMY      There were no vitals filed for this visit.   Subjective Assessment - 03/08/21 1601     Subjective Pt. stated she was doing exercise and getting some movement better in certain ways.  Pt. indicated a little pain here and there today,  not able to sleep well - tried bed.    Limitations House hold activities;Lifting    Patient Stated Goals Reduce pain, improve arm movement.    Currently in Pain? Yes    Pain Score 3     Pain Location Shoulder    Pain Orientation Right    Pain Descriptors / Indicators Aching    Pain Type Chronic pain;Surgical pain    Pain Onset More than a month ago    Aggravating Factors  stretching, using arm at times, nighttime    Pain Relieving Factors mobility improving                               [facility] Adult PT Treatment/Exercise - 03/08/21 0001       Shoulder Exercises: Supine   Other Supine Exercises supine passive Rt shoulder flexion c Lt assist x 10      Shoulder  Exercises: Seated   Other Seated Exercises table slides flexion 5 sec hold x 10      Shoulder Exercises: Stretch   Other Shoulder Stretches supine wand ER stretch 5 sec hold in 30 deg abduction x 10      Manual Therapy   Manual therapy comments G2-g3 inferior joints mobs, posterior joint mobs to Rt glenohumeral joint                      PT Short Term Goals - 03/04/21 0933       PT SHORT TERM GOAL #1   Title Patient will demonstrate independent use of home exercise program to maintain progress from in clinic treatments.    Time 3    Period Weeks    Status New    Target Date 03/25/21               PT Long Term Goals - 03/04/21 0933       PT LONG TERM GOAL #1   Title Patient will demonstrate/report pain at worst less than or equal to 2/10 to facilitate minimal limitation in daily activity secondary to pain symptoms.    Time 10    Period Weeks    Status New    Target Date 05/13/21      PT LONG TERM GOAL #2   Title Patient will demonstrate independent use of home exercise program to facilitate ability to maintain/progress functional gains from skilled physical therapy services.    Time 10    Period Weeks    Status New    Target Date 05/13/21      PT LONG TERM GOAL #3   Title Pt. will demonstrate FOTO outcome > or = 58% to indicated reduced disability due to condition.    Time 10    Period Weeks    Status New    Target Date 05/13/21      PT LONG TERM GOAL #4   Title Patient will demonstrate Rt [place] joint mobility with 15% of Lt to facilitate usual self care, dressing, reaching overhead at PLOF s limitation due to symptoms.    Time 10    Period Weeks    Status New    Target Date 05/13/21      PT LONG TERM GOAL #5   Title Patient will demonstrate Rt UE MMT > or = 4/5  throughout to facilitate usual lifting, carrying in functional activity to PLOF s limitation.    Time 10    Period Weeks  Status New    Target Date 05/13/21                 03/10/21 0749  Plan  Clinical Impression Statement Pt. demonstrated slight improvements in mobility passively compared to evaluation.  Continued presence of capsular restrictions in all directions.  Plan to continue to improve passive mobility to prepare for possible upcoming transitioning into active movement pending MD visit results.  Examination-Activity Limitations Reach Overhead;Caring for Others;Carry;Dressing;Lift;Sleep  Examination-Participation Restrictions Cleaning;Community Activity;Driving;Interpersonal Relationship;Occupation  Pt will benefit from skilled therapeutic intervention in order to improve on the following deficits Decreased endurance;Hypomobility;Increased edema;Decreased activity tolerance;Decreased strength;Impaired UE functional use;Pain;Decreased mobility;Impaired perceived functional ability;Improper body mechanics;Postural dysfunction;Impaired flexibility;Decreased coordination;Decreased range of motion  Stability/Clinical Decision Making Stable/Uncomplicated  Rehab Potential  (fair to good)  PT Frequency 2x / week  PT Duration Other (comment) (10 weeks)  PT Treatment/Interventions ADLs/Self Care Home Management;Cryotherapy;Electrical Stimulation;Therapeutic exercise;Iontophoresis '4mg'$ /ml Dexamethasone;Moist Heat;Traction;Balance training;Therapeutic activities;Functional mobility training;Stair training;Gait training;DME Instruction;Ultrasound;Neuromuscular re-education;Patient/family education;Passive range of motion;Spinal Manipulations;Joint Manipulations;Dry needling;Taping;Vasopneumatic Device;Manual techniques  PT Next Visit Plan Plan to continue with Rt shoulder joint mobilizations for ROM gains, PROM only per MD protocol at this time.  PT [place] and Agree with Plan of Care Patient        Patient will benefit from skilled therapeutic intervention in order to improve the following deficits and impairments:     Visit  Diagnosis: Chronic right shoulder pain  Stiffness of right shoulder, not elsewhere classified  Muscle weakness (generalized)  Abnormal posture  Localized edema     Problem List Patient Active Problem List   Diagnosis Date Noted   Complete tear of right rotator cuff    Biceps tendonitis on right    S/P rotator cuff repair 02/08/2021   Chest discomfort 01/26/2014   Palpitations 01/26/2014   Cellulitis 09/08/2013   Abdominal pain, left mid abdomen, chronic 02/26/2013   Hyponatremia 10/30/2012   COPD, questioned 01/02/2012   Allergic rhinitis due to pollen 10/20/2010   MITRAL VALVE PROLAPSE, HX OF 05/26/2009   UNSPECIFIED HEARING LOSS 05/21/2009   RHINOSINUSITIS, 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 [place], [place], [address] Phone: [phone]   Fax:  [phone]  Name: Haley Wade MRN: [id number] Date of Birth: Nov 23, 1945

## 2021-03-10 ENCOUNTER — Ambulatory Visit (INDEPENDENT_AMBULATORY_CARE_PROVIDER_SITE_OTHER): Payer: Medicare Other | Admitting: Physical Therapy

## 2021-03-10 ENCOUNTER — Encounter: Payer: Self-pay | Admitting: Physical Therapy

## 2021-03-10 ENCOUNTER — Other Ambulatory Visit: Payer: Self-pay

## 2021-03-10 DIAGNOSIS — R293 Abnormal posture: Secondary | ICD-10-CM | POA: Diagnosis not present

## 2021-03-10 DIAGNOSIS — M6281 Muscle weakness (generalized): Secondary | ICD-10-CM

## 2021-03-10 DIAGNOSIS — R600 Localized edema: Secondary | ICD-10-CM | POA: Diagnosis not present

## 2021-03-10 DIAGNOSIS — M25511 Pain in right shoulder: Secondary | ICD-10-CM

## 2021-03-10 DIAGNOSIS — M25611 Stiffness of right shoulder, not elsewhere classified: Secondary | ICD-10-CM | POA: Diagnosis not present

## 2021-03-10 DIAGNOSIS — G8929 Other chronic pain: Secondary | ICD-10-CM

## 2021-03-10 NOTE — Therapy (Signed)
[facility] [address] [place], [place], [address] Phone: [phone]   Fax:  [phone]  Physical Therapy Treatment  Patient Details  Name: Haley Wade MRN: [id number] Date of Birth: Jun 11, 1946 Referring Provider (PT): Dr. Meredith Pel   Encounter Date: 03/10/2021   PT End of Session - 03/10/21 1145     Visit Number 3    Number of Visits 20    Date for PT Re-Evaluation 05/13/21    Authorization Type Medicare, AARP    Progress Note Due on Visit 10    PT Start Time 1100    PT Stop Time 1143    PT Time Calculation (min) 43 min    Activity Tolerance Patient tolerated treatment well    Behavior During Therapy WFL for tasks assessed/performed             Past Medical History:  Diagnosis Date   Allergic rhinitis, cause unspecified    Anemia    Arthritis    Cellulitis    Chest discomfort    COPD, questioned    GAVE (gastric antral vascular ectasia)    Hyponatremia    MITRAL VALVE PROLAPSE, HX OF    Other chronic sinusitis    Palpitations    Syncope    Unspecified hearing loss     Past Surgical History:  Procedure Laterality Date   APPENDECTOMY     BACK SURGERY     BICEPT TENODESIS Right 02/08/2021   Procedure: BICEPS TENODESIS;  Surgeon: Meredith Pel, MD;  Location: [facility];  Service: Orthopedics;  Laterality: Right;   BLADDER SURGERY     BOWEL RESECTION N/A 11/01/2012   Procedure: SMALL BOWEL RESECTION;  Surgeon: Earnstine Regal, MD;  [facility];  Service: General;  Laterality: N/A;   BREAST ENHANCEMENT SURGERY     revisions   BREAST SURGERY     CHOLECYSTECTOMY     COLON SURGERY     ESOPHAGOGASTRODUODENOSCOPY (EGD) WITH PROPOFOL N/A 10/14/2019   Procedure: ESOPHAGOGASTRODUODENOSCOPY (EGD) WITH PROPOFOL;  Surgeon: Clarene Essex, MD;  [facility];  Service: Endoscopy;  Laterality: N/A;   EYE SURGERY     GI RADIOFREQUENCY ABLATION  10/14/2019   Procedure: GI RADIOFREQUENCY ABLATION;   Surgeon: Clarene Essex, MD;  [facility];  Service: Endoscopy;;   LAPAROSCOPY N/A 11/01/2012   Procedure: LAPAROSCOPY DIAGNOSTIC;  Surgeon: Earnstine Regal, MD;  [facility];  Service: General;  Laterality: N/A;   LAPAROTOMY N/A 11/01/2012   Procedure: EXPLORATORY LAPAROTOMY;  Surgeon: Earnstine Regal, MD;  [facility];  Service: General;  Laterality: N/A;   LYSIS OF ADHESION N/A 11/01/2012   Procedure: LYSIS OF ADHESION;  Surgeon: Earnstine Regal, MD;  [facility];  Service: General;  Laterality: N/A;   RHINOPLASTY     SHOULDER ARTHROSCOPY WITH OPEN ROTATOR CUFF REPAIR AND DISTAL CLAVICLE ACROMINECTOMY Right 02/08/2021   Procedure: SHOULDER ARTHROSCOPY WITH BICEPS TENDON RELEASE, MINI OPEN ROTATOR CUFF TEAR REPAIR OF THE INFRASPINATUS SUPRASPINATUS AND UPPER PORTIION OF THE SUBSCAP  WITH BICEPS TENODESIS;  Surgeon: Meredith Pel, MD;  Location: [facility];  Service: Orthopedics;  Laterality: Right;   TONSILLECTOMY     TOTAL ABDOMINAL HYSTERECTOMY      There were no vitals filed for this visit.   Subjective Assessment - 03/10/21 1100     Subjective still having trouble figuring out the best way to do ER with wand/cane    Limitations House hold activities;Lifting    Patient  Stated Goals Reduce pain, improve arm movement.    Currently in Pain? Yes    Pain Score 3     Pain Location Shoulder    Pain Orientation Right    Pain Descriptors / Indicators Aching    Pain Type Chronic pain;Surgical pain    Pain Onset More than a month ago    Pain Frequency Constant    Aggravating Factors  stretching, using arms at times nighttime    Pain Relieving Factors mobility improving                [facility] PT Assessment - 03/10/21 0001       PROM   Right Shoulder Flexion 117 Degrees   in supine   Right Shoulder ABduction 100 Degrees   in supine   Right Shoulder Internal Rotation 47 Degrees   in 30 deg abdct   Right Shoulder External Rotation 34 Degrees   in 30 deg abdct                           [facility] Adult PT Treatment/Exercise - 03/10/21 1123       Shoulder Exercises: Supine   External Rotation PROM;Right;10 reps    External Rotation Limitations with bar - cues for HEP review    Other Supine Exercises supine passive Rt shoulder flexion c Lt assist x 10      Shoulder Exercises: Seated   Retraction 20 reps    Retraction Limitations 5 sec      Manual Therapy   Manual therapy comments G1-2 inferior joints mobs, posterior joint mobs to Rt glenohumeral joint; Rt shoulder PROM all directions to tolerance                      PT Short Term Goals - 03/10/21 0751       PT SHORT TERM GOAL #1   Title Patient will demonstrate independent use of home exercise program to maintain progress from in clinic treatments.    Time 3    Period Weeks    Status On-going    Target Date 03/25/21               PT Long Term Goals - 03/04/21 0933       PT LONG TERM GOAL #1   Title Patient will demonstrate/report pain at worst less than or equal to 2/10 to facilitate minimal limitation in daily activity secondary to pain symptoms.    Time 10    Period Weeks    Status New    Target Date 05/13/21      PT LONG TERM GOAL #2   Title Patient will demonstrate independent use of home exercise program to facilitate ability to maintain/progress functional gains from skilled physical therapy services.    Time 10    Period Weeks    Status New    Target Date 05/13/21      PT LONG TERM GOAL #3   Title Pt. will demonstrate FOTO outcome > or = 58% to indicated reduced disability due to condition.    Time 10    Period Weeks    Status New    Target Date 05/13/21      PT LONG TERM GOAL #4   Title Patient will demonstrate Rt [place] joint mobility with 15% of Lt to facilitate usual self care, dressing, reaching overhead at PLOF s limitation due to symptoms.    Time 10  Period Weeks    Status New    Target Date 05/13/21      PT LONG TERM GOAL #5    Title Patient will demonstrate Rt UE MMT > or = 4/5  throughout to facilitate usual lifting, carrying in functional activity to PLOF s limitation.    Time 10    Period Weeks    Status New    Target Date 05/13/21                   Plan - 03/10/21 1146     Clinical Impression Statement Pt has demonstrated great progress with PROM to date and is progressing well with PT.  Continued work on PROM with review of HEP to ensure good understanding.  Will continue to benefit from PT to maximize function.    Examination-Activity Limitations Reach Overhead;Caring for Others;Carry;Dressing;Lift;Sleep    Examination-Participation Restrictions Cleaning;Community Activity;Driving;Interpersonal Relationship;Occupation    Stability/Clinical Decision Making Stable/Uncomplicated    Rehab Potential --   fair to good   PT Frequency 2x / week    PT Duration Other (comment)   10 weeks   PT Treatment/Interventions ADLs/Self Care Home Management;Cryotherapy;Electrical Stimulation;Therapeutic exercise;Iontophoresis '4mg'$ /ml Dexamethasone;Moist Heat;Traction;Balance training;Therapeutic activities;Functional mobility training;Stair training;Gait training;DME Instruction;Ultrasound;Neuromuscular re-education;Patient/family education;Passive range of motion;Spinal Manipulations;Joint Manipulations;Dry needling;Taping;Vasopneumatic Device;Manual techniques    PT Next Visit Plan Plan to continue with Rt shoulder joint mobilizations for ROM gains, PROM only per MD protocol at this time; maintain within pain tolerance    PT Home Exercise Plan [email or web address]    Consulted and Agree with Plan of Care Patient             Patient will benefit from skilled therapeutic intervention in order to improve the following deficits and impairments:  Decreased endurance, Hypomobility, Increased edema, Decreased activity tolerance, Decreased strength, Impaired UE functional use, Pain, Decreased mobility, Impaired perceived functional  ability, Improper body mechanics, Postural dysfunction, Impaired flexibility, Decreased coordination, Decreased range of motion  Visit Diagnosis: Chronic right shoulder pain  Stiffness of right shoulder, not elsewhere classified  Muscle weakness (generalized)  Abnormal posture  Localized edema     Problem List Patient Active Problem List   Diagnosis Date Noted   Complete tear of right rotator cuff    Biceps tendonitis on right    S/P rotator cuff repair 02/08/2021   Chest discomfort 01/26/2014   Palpitations 01/26/2014   Cellulitis 09/08/2013   Abdominal pain, left mid abdomen, chronic 02/26/2013   Hyponatremia 10/30/2012   COPD, questioned 01/02/2012   Allergic rhinitis due to pollen 10/20/2010   MITRAL VALVE PROLAPSE, HX OF 05/26/2009   UNSPECIFIED HEARING LOSS 05/21/2009   RHINOSINUSITIS, 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

## 2021-03-14 ENCOUNTER — Encounter: Payer: Medicare Other | Admitting: Orthopedic Surgery

## 2021-03-15 ENCOUNTER — Other Ambulatory Visit: Payer: Self-pay

## 2021-03-15 ENCOUNTER — Ambulatory Visit (INDEPENDENT_AMBULATORY_CARE_PROVIDER_SITE_OTHER): Payer: Medicare Other | Admitting: Rehabilitative and Restorative Service Providers"

## 2021-03-15 ENCOUNTER — Encounter: Payer: Self-pay | Admitting: Rehabilitative and Restorative Service Providers"

## 2021-03-15 DIAGNOSIS — R600 Localized edema: Secondary | ICD-10-CM

## 2021-03-15 DIAGNOSIS — M25611 Stiffness of right shoulder, not elsewhere classified: Secondary | ICD-10-CM | POA: Diagnosis not present

## 2021-03-15 DIAGNOSIS — R293 Abnormal posture: Secondary | ICD-10-CM | POA: Diagnosis not present

## 2021-03-15 DIAGNOSIS — G8929 Other chronic pain: Secondary | ICD-10-CM

## 2021-03-15 DIAGNOSIS — M6281 Muscle weakness (generalized): Secondary | ICD-10-CM | POA: Diagnosis not present

## 2021-03-15 DIAGNOSIS — M25511 Pain in right shoulder: Secondary | ICD-10-CM | POA: Diagnosis not present

## 2021-03-15 NOTE — Therapy (Signed)
[facility] [address] [place], [place], [address] Phone: [phone]   Fax:  [phone]  Physical Therapy Treatment  Patient Details  Name: Haley Wade MRN: [id number] Date of Birth: 02-01-1946 Referring Provider (PT): Dr. Meredith Pel   Encounter Date: 03/15/2021   PT End of Session - 03/15/21 1243     Visit Number 4    Number of Visits 20    Date for PT Re-Evaluation 05/13/21    Authorization Type Medicare, AARP    Progress Note Due on Visit 10    PT Start Time 1258    PT Stop Time 1337    PT Time Calculation (min) 39 min    Activity Tolerance Patient limited by pain   end range pain limitations   Behavior During Therapy [facility] for tasks assessed/performed             Past Medical History:  Diagnosis Date   Allergic rhinitis, cause unspecified    Anemia    Arthritis    Cellulitis    Chest discomfort    COPD, questioned    GAVE (gastric antral vascular ectasia)    Hyponatremia    MITRAL VALVE PROLAPSE, HX OF    Other chronic sinusitis    Palpitations    Syncope    Unspecified hearing loss     Past Surgical History:  Procedure Laterality Date   APPENDECTOMY     BACK SURGERY     BICEPT TENODESIS Right 02/08/2021   Procedure: BICEPS TENODESIS;  Surgeon: Meredith Pel, MD;  Location: [place];  Service: Orthopedics;  Laterality: Right;   BLADDER SURGERY     BOWEL RESECTION N/A 11/01/2012   Procedure: SMALL BOWEL RESECTION;  Surgeon: Earnstine Regal, MD;  [facility];  Service: General;  Laterality: N/A;   BREAST ENHANCEMENT SURGERY     revisions   BREAST SURGERY     CHOLECYSTECTOMY     COLON SURGERY     ESOPHAGOGASTRODUODENOSCOPY (EGD) WITH PROPOFOL N/A 10/14/2019   Procedure: ESOPHAGOGASTRODUODENOSCOPY (EGD) WITH PROPOFOL;  Surgeon: Clarene Essex, MD;  [facility];  Service: Endoscopy;  Laterality: N/A;   EYE SURGERY     GI RADIOFREQUENCY ABLATION  10/14/2019   Procedure: GI RADIOFREQUENCY  ABLATION;  Surgeon: Clarene Essex, MD;  [facility];  Service: Endoscopy;;   LAPAROSCOPY N/A 11/01/2012   Procedure: LAPAROSCOPY DIAGNOSTIC;  Surgeon: Earnstine Regal, MD;  [facility];  Service: General;  Laterality: N/A;   LAPAROTOMY N/A 11/01/2012   Procedure: EXPLORATORY LAPAROTOMY;  Surgeon: Earnstine Regal, MD;  [facility];  Service: General;  Laterality: N/A;   LYSIS OF ADHESION N/A 11/01/2012   Procedure: LYSIS OF ADHESION;  Surgeon: Earnstine Regal, MD;  [facility];  Service: General;  Laterality: N/A;   RHINOPLASTY     SHOULDER ARTHROSCOPY WITH OPEN ROTATOR CUFF REPAIR AND DISTAL CLAVICLE ACROMINECTOMY Right 02/08/2021   Procedure: SHOULDER ARTHROSCOPY WITH BICEPS TENDON RELEASE, MINI OPEN ROTATOR CUFF TEAR REPAIR OF THE INFRASPINATUS SUPRASPINATUS AND UPPER PORTIION OF THE SUBSCAP  WITH BICEPS TENODESIS;  Surgeon: Meredith Pel, MD;  Location: [place];  Service: Orthopedics;  Laterality: Right;   TONSILLECTOMY     TOTAL ABDOMINAL HYSTERECTOMY      There were no vitals filed for this visit.   Subjective Assessment - 03/15/21 1300     Subjective Pt. indicated she hit shoulder against door on saturday and was ok.  Pt. indicated she was trying to  do the ER exercise( which she isn't sure she is doing it right) and felt sore increase on Sunday.  Not doing it yesterday helped lower.    Limitations House hold activities;Lifting    Patient Stated Goals Reduce pain, improve arm movement.    Currently in Pain? Yes    Pain Score 4     Pain Location Shoulder    Pain Orientation Right    Pain Descriptors / Indicators Aching;Sore    Pain Type Chronic pain;Surgical pain    Pain Onset More than a month ago    Pain Frequency Constant    Aggravating Factors  end range pain noted    Pain Relieving Factors rest yesterday helped                [facility] PT Assessment - 03/15/21 0001       Assessment   Medical Diagnosis Rt RTC repair    Referring Provider (PT) Dr.  Meredith Pel    Onset Date/Surgical Date 08/14/20    Hand Dominance Right      PROM   Right Shoulder Flexion 122 Degrees   in supine c end range pain   Right Shoulder ABduction 102 Degrees   in supine, pain at end range   Right Shoulder Internal Rotation 60 Degrees   in 45 degrees abduction in supine, pain at end range   Right Shoulder External Rotation 50 Degrees   in supine in 45 deg abduction c pain at end range     Palpation   Palpation comment Capsular tightness in all directions, trigger points in lat Rt, infraspinatus, supraspinatus                                   PT Education - 03/15/21 1317     Education Details updates of measurements    Person(s) Educated Patient    Methods Explanation    Comprehension Verbalized understanding              PT Short Term Goals - 03/10/21 0751       PT SHORT TERM GOAL #1   Title Patient will demonstrate independent use of home exercise program to maintain progress from in clinic treatments.    Time 3    Period Weeks    Status On-going    Target Date 03/25/21               PT Long Term Goals - 03/04/21 0933       PT LONG TERM GOAL #1   Title Patient will demonstrate/report pain at worst less than or equal to 2/10 to facilitate minimal limitation in daily activity secondary to pain symptoms.    Time 10    Period Weeks    Status New    Target Date 05/13/21      PT LONG TERM GOAL #2   Title Patient will demonstrate independent use of home exercise program to facilitate ability to maintain/progress functional gains from skilled physical therapy services.    Time 10    Period Weeks    Status New    Target Date 05/13/21      PT LONG TERM GOAL #3   Title Pt. will demonstrate FOTO outcome > or = 58% to indicated reduced disability due to condition.    Time 10    Period Weeks    Status New    Target Date 05/13/21  PT LONG TERM GOAL #4   Title Patient will demonstrate Rt [place] joint  mobility with 15% of Lt to facilitate usual self care, dressing, reaching overhead at PLOF s limitation due to symptoms.    Time 10    Period Weeks    Status New    Target Date 05/13/21      PT LONG TERM GOAL #5   Title Patient will demonstrate Rt UE MMT > or = 4/5  throughout to facilitate usual lifting, carrying in functional activity to PLOF s limitation.    Time 10    Period Weeks    Status New    Target Date 05/13/21                   Plan - 03/15/21 1318     Clinical Impression Statement Pt. has attended 4 visits to this point. See objective data for updated information showing gains in passive ROM.  Continued pain limitation and capsular limitation combined for end range limitations related to Rt shoulder at this time.  Continued education on HEP indicated at ths time.  Anticipating transitioning towards AAROM and isometric activation pending MD visit approval.    Examination-Activity Limitations Reach Overhead;Caring for Others;Carry;Dressing;Lift;Sleep    Examination-Participation Restrictions Cleaning;Community Activity;Driving;Interpersonal Relationship;Occupation    Stability/Clinical Decision Making Stable/Uncomplicated    Rehab Potential --   fair to good   PT Frequency 2x / week    PT Duration Other (comment)   10 weeks   PT Treatment/Interventions ADLs/Self Care Home Management;Cryotherapy;Electrical Stimulation;Therapeutic exercise;Iontophoresis '4mg'$ /ml Dexamethasone;Moist Heat;Traction;Balance training;Therapeutic activities;Functional mobility training;Stair training;Gait training;DME Instruction;Ultrasound;Neuromuscular re-education;Patient/family education;Passive range of motion;Spinal Manipulations;Joint Manipulations;Dry needling;Taping;Vasopneumatic Device;Manual techniques    PT Next Visit Plan Manual mobs c lat release,  PROM only per MD protocol at this time (may change rom MD visit)    [place] and Agree with Plan of  Care Patient             Patient will benefit from skilled therapeutic intervention in order to improve the following deficits and impairments:  Decreased endurance, Hypomobility, Increased edema, Decreased activity tolerance, Decreased strength, Impaired UE functional use, Pain, Decreased mobility, Impaired perceived functional ability, Improper body mechanics, Postural dysfunction, Impaired flexibility, Decreased coordination, Decreased range of motion  Visit Diagnosis: Chronic right shoulder pain  Stiffness of right shoulder, not elsewhere classified  Muscle weakness (generalized)  Abnormal posture  Localized edema     Problem List Patient Active Problem List   Diagnosis Date Noted   Complete tear of right rotator cuff    Biceps tendonitis on right    S/P rotator cuff repair 02/08/2021   Chest discomfort 01/26/2014   Palpitations 01/26/2014   Cellulitis 09/08/2013   Abdominal pain, left mid abdomen, chronic 02/26/2013   Hyponatremia 10/30/2012   COPD, questioned 01/02/2012   Allergic rhinitis due to pollen 10/20/2010   MITRAL VALVE PROLAPSE, HX OF 05/26/2009   UNSPECIFIED HEARING LOSS 05/21/2009   RHINOSINUSITIS, 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   Scot Jun, PT, DPT, OCS, ATC 03/15/21  1:33 PM    [facility] [address] [place], [place], [address] Phone: [phone]   Fax:  [phone]  Name: Haley Wade MRN: [id number] Date of Birth: 06-30-1946

## 2021-03-17 ENCOUNTER — Other Ambulatory Visit: Payer: Self-pay | Admitting: Orthopedic Surgery

## 2021-03-17 ENCOUNTER — Encounter: Payer: Self-pay | Admitting: Orthopedic Surgery

## 2021-03-17 ENCOUNTER — Ambulatory Visit (INDEPENDENT_AMBULATORY_CARE_PROVIDER_SITE_OTHER): Payer: Medicare Other | Admitting: Orthopedic Surgery

## 2021-03-17 ENCOUNTER — Other Ambulatory Visit: Payer: Self-pay

## 2021-03-17 DIAGNOSIS — M75121 Complete rotator cuff tear or rupture of right shoulder, not specified as traumatic: Secondary | ICD-10-CM

## 2021-03-17 MED ORDER — CYCLOBENZAPRINE HCL 5 MG PO TABS: 10.0000 mg | ORAL_TABLET | Freq: Three times a day (TID) | ORAL | 0 refills | Status: DC | PRN

## 2021-03-17 NOTE — Progress Notes (Signed)
Post-Op Visit Note   Patient: Haley Wade           Date of Birth: 1945-10-14           MRN: [id number] Visit Date: 03/17/2021 PCP: Shon Baton, MD   Assessment & Plan:  Chief Complaint:  Chief Complaint  Patient presents with   Right Shoulder - Routine Post Op    02/08/21 Right shoulder scope with BT release and MO RCTR   Visit Diagnoses:  1. Nontraumatic complete tear of right rotator cuff     Plan: Patient is about 5 weeks out right shoulder rotator cuff tear repair with biceps tenodesis.  She is out of the sling.  She is enjoying physical therapy here.  Starting strengthening program next week.  On exam she has external rotation at 15 degrees of abduction to about 25 degrees which is an improvement.  Passively she is improving her abduction and forward flexion as well.  Does not quite have the strength yet for those motions but that is improving.  Plan is to continue home exercise program.  Refill Flexeril.  Follow-up in 4 weeks for clinical recheck.  Avoid lifting out in front of her body at this time.  Follow-Up Instructions: Return in about 4 weeks (around 04/14/2021).   Orders:  No orders of the defined types were placed in this encounter.  No orders of the defined types were placed in this encounter.   Imaging: No results found.  PMFS History: Patient Active Problem List   Diagnosis Date Noted   Complete tear of right rotator cuff    Biceps tendonitis on right    S/P rotator cuff repair 02/08/2021   Chest discomfort 01/26/2014   Palpitations 01/26/2014   Cellulitis 09/08/2013   Abdominal pain, left mid abdomen, chronic 02/26/2013   Hyponatremia 10/30/2012   COPD, questioned 01/02/2012   Allergic rhinitis due to pollen 10/20/2010   MITRAL VALVE PROLAPSE, HX OF 05/26/2009   UNSPECIFIED HEARING LOSS 05/21/2009   RHINOSINUSITIS, 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   Past Medical History:  Diagnosis Date   Allergic rhinitis, cause unspecified    Anemia    Arthritis     Cellulitis    Chest discomfort    COPD, questioned    GAVE (gastric antral vascular ectasia)    Hyponatremia    MITRAL VALVE PROLAPSE, HX OF    Other chronic sinusitis    Palpitations    Syncope    Unspecified hearing loss     Family History  Problem Relation Age of Onset   Other Mother        c difficile gastroenteritis   Lung cancer Father        smoker   Liver disease Brother    Liver cancer Brother    Asthma Other        grandmother   Other Other        bronchitis-grandmother    Past Surgical History:  Procedure Laterality Date   APPENDECTOMY     BACK SURGERY     BICEPT TENODESIS Right 02/08/2021   Procedure: BICEPS TENODESIS;  Surgeon: Meredith Pel, MD;  Location: [place];  Service: Orthopedics;  Laterality: Right;   BLADDER SURGERY     BOWEL RESECTION N/A 11/01/2012   Procedure: SMALL BOWEL RESECTION;  Surgeon: Earnstine Regal, MD;  [facility];  Service: General;  Laterality: N/A;   BREAST ENHANCEMENT SURGERY     revisions   BREAST SURGERY     CHOLECYSTECTOMY  COLON SURGERY     ESOPHAGOGASTRODUODENOSCOPY (EGD) WITH PROPOFOL N/A 10/14/2019   Procedure: ESOPHAGOGASTRODUODENOSCOPY (EGD) WITH PROPOFOL;  Surgeon: Clarene Essex, MD;  [facility];  Service: Endoscopy;  Laterality: N/A;   EYE SURGERY     GI RADIOFREQUENCY ABLATION  10/14/2019   Procedure: GI RADIOFREQUENCY ABLATION;  Surgeon: Clarene Essex, MD;  [facility];  Service: Endoscopy;;   LAPAROSCOPY N/A 11/01/2012   Procedure: LAPAROSCOPY DIAGNOSTIC;  Surgeon: Earnstine Regal, MD;  [facility];  Service: General;  Laterality: N/A;   LAPAROTOMY N/A 11/01/2012   Procedure: EXPLORATORY LAPAROTOMY;  Surgeon: Earnstine Regal, MD;  [facility];  Service: General;  Laterality: N/A;   LYSIS OF ADHESION N/A 11/01/2012   Procedure: LYSIS OF ADHESION;  Surgeon: Earnstine Regal, MD;  [facility];  Service: General;  Laterality: N/A;   RHINOPLASTY     SHOULDER ARTHROSCOPY WITH  OPEN ROTATOR CUFF REPAIR AND DISTAL CLAVICLE ACROMINECTOMY Right 02/08/2021   Procedure: SHOULDER ARTHROSCOPY WITH BICEPS TENDON RELEASE, MINI OPEN ROTATOR CUFF TEAR REPAIR OF THE INFRASPINATUS SUPRASPINATUS AND UPPER PORTIION OF THE SUBSCAP  WITH BICEPS TENODESIS;  Surgeon: Meredith Pel, MD;  Location: [place];  Service: Orthopedics;  Laterality: Right;   TONSILLECTOMY     TOTAL ABDOMINAL HYSTERECTOMY     Social History   Occupational History   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

## 2021-03-18 ENCOUNTER — Encounter: Payer: Self-pay | Admitting: Rehabilitative and Restorative Service Providers"

## 2021-03-18 ENCOUNTER — Ambulatory Visit (INDEPENDENT_AMBULATORY_CARE_PROVIDER_SITE_OTHER): Payer: Medicare Other | Admitting: Rehabilitative and Restorative Service Providers"

## 2021-03-18 DIAGNOSIS — G8929 Other chronic pain: Secondary | ICD-10-CM

## 2021-03-18 DIAGNOSIS — M25511 Pain in right shoulder: Secondary | ICD-10-CM | POA: Diagnosis not present

## 2021-03-18 DIAGNOSIS — M25611 Stiffness of right shoulder, not elsewhere classified: Secondary | ICD-10-CM

## 2021-03-18 DIAGNOSIS — R293 Abnormal posture: Secondary | ICD-10-CM

## 2021-03-18 DIAGNOSIS — M6281 Muscle weakness (generalized): Secondary | ICD-10-CM

## 2021-03-18 DIAGNOSIS — R600 Localized edema: Secondary | ICD-10-CM | POA: Diagnosis not present

## 2021-03-18 NOTE — Therapy (Signed)
[facility] [address] [place], [place], [address] Phone: [phone]   Fax:  [phone]  Physical Therapy Treatment  Patient Details  Name: Haley Wade MRN: [id number] Date of Birth: 06-22-1946 Referring Provider (PT): Dr. Meredith Pel   Encounter Date: 03/18/2021   PT End of Session - 03/18/21 1046     Visit Number 5    Number of Visits 20    Date for PT Re-Evaluation 05/13/21    Authorization Type Medicare, AARP    Progress Note Due on Visit 10    PT Start Time 1013    PT Stop Time 1053    PT Time Calculation (min) 40 min    Activity Tolerance Patient tolerated treatment well    Behavior During Therapy WFL for tasks assessed/performed             Past Medical History:  Diagnosis Date   Allergic rhinitis, cause unspecified    Anemia    Arthritis    Cellulitis    Chest discomfort    COPD, questioned    GAVE (gastric antral vascular ectasia)    Hyponatremia    MITRAL VALVE PROLAPSE, HX OF    Other chronic sinusitis    Palpitations    Syncope    Unspecified hearing loss     Past Surgical History:  Procedure Laterality Date   APPENDECTOMY     BACK SURGERY     BICEPT TENODESIS Right 02/08/2021   Procedure: BICEPS TENODESIS;  Surgeon: Meredith Pel, MD;  Location: [place];  Service: Orthopedics;  Laterality: Right;   BLADDER SURGERY     BOWEL RESECTION N/A 11/01/2012   Procedure: SMALL BOWEL RESECTION;  Surgeon: Earnstine Regal, MD;  [facility];  Service: General;  Laterality: N/A;   BREAST ENHANCEMENT SURGERY     revisions   BREAST SURGERY     CHOLECYSTECTOMY     COLON SURGERY     ESOPHAGOGASTRODUODENOSCOPY (EGD) WITH PROPOFOL N/A 10/14/2019   Procedure: ESOPHAGOGASTRODUODENOSCOPY (EGD) WITH PROPOFOL;  Surgeon: Clarene Essex, MD;  [facility];  Service: Endoscopy;  Laterality: N/A;   EYE SURGERY     GI RADIOFREQUENCY ABLATION  10/14/2019   Procedure: GI RADIOFREQUENCY ABLATION;   Surgeon: Clarene Essex, MD;  [facility];  Service: Endoscopy;;   LAPAROSCOPY N/A 11/01/2012   Procedure: LAPAROSCOPY DIAGNOSTIC;  Surgeon: Earnstine Regal, MD;  [facility];  Service: General;  Laterality: N/A;   LAPAROTOMY N/A 11/01/2012   Procedure: EXPLORATORY LAPAROTOMY;  Surgeon: Earnstine Regal, MD;  [facility];  Service: General;  Laterality: N/A;   LYSIS OF ADHESION N/A 11/01/2012   Procedure: LYSIS OF ADHESION;  Surgeon: Earnstine Regal, MD;  [facility];  Service: General;  Laterality: N/A;   RHINOPLASTY     SHOULDER ARTHROSCOPY WITH OPEN ROTATOR CUFF REPAIR AND DISTAL CLAVICLE ACROMINECTOMY Right 02/08/2021   Procedure: SHOULDER ARTHROSCOPY WITH BICEPS TENDON RELEASE, MINI OPEN ROTATOR CUFF TEAR REPAIR OF THE INFRASPINATUS SUPRASPINATUS AND UPPER PORTIION OF THE SUBSCAP  WITH BICEPS TENODESIS;  Surgeon: Meredith Pel, MD;  Location: [place];  Service: Orthopedics;  Laterality: Right;   TONSILLECTOMY     TOTAL ABDOMINAL HYSTERECTOMY      There were no vitals filed for this visit.       [facility] PT Assessment - 03/18/21 0001       Assessment   Medical Diagnosis Rt RTC repair    Referring Provider (PT) Dr. Meredith Pel  Onset Date/Surgical Date 08/14/20    Hand Dominance Right      Precautions   Precaution Comments Able to progress to strengthening, AAROM, AROM                           [facility] Adult PT Treatment/Exercise - 03/18/21 0001       Shoulder Exercises: Supine   Flexion AAROM;Both   1 lb bar 2 x 10     Shoulder Exercises: ROM/Strengthening   UBE (Upper Arm Bike) Lvl 1.0 4 mins fwd, 3 mins backward      Shoulder Exercises: Isometric Strengthening   Other Isometric Exercises flexion, abd, er, ir c arm at side submax painfree 5 sec hold x 10 each      Manual Therapy   Manual therapy comments g3 inferior joint mobs, mobilization c movement c posterior glide for ER.  Compression to lat c muscle energy contract/relax  techniques for elevation gains                    PT Education - 03/18/21 1039     Education Details Progressed to AAROM stretching in flexion, isometric activations sub max painfree    Person(s) Educated Patient    Methods Explanation;Demonstration;Verbal cues;Handout    Comprehension Returned demonstration;Verbalized understanding              PT Short Term Goals - 03/10/21 0751       PT SHORT TERM GOAL #1   Title Patient will demonstrate independent use of home exercise program to maintain progress from in clinic treatments.    Time 3    Period Weeks    Status On-going    Target Date 03/25/21               PT Long Term Goals - 03/04/21 0933       PT LONG TERM GOAL #1   Title Patient will demonstrate/report pain at worst less than or equal to 2/10 to facilitate minimal limitation in daily activity secondary to pain symptoms.    Time 10    Period Weeks    Status New    Target Date 05/13/21      PT LONG TERM GOAL #2   Title Patient will demonstrate independent use of home exercise program to facilitate ability to maintain/progress functional gains from skilled physical therapy services.    Time 10    Period Weeks    Status New    Target Date 05/13/21      PT LONG TERM GOAL #3   Title Pt. will demonstrate FOTO outcome > or = 58% to indicated reduced disability due to condition.    Time 10    Period Weeks    Status New    Target Date 05/13/21      PT LONG TERM GOAL #4   Title Patient will demonstrate Rt [place] joint mobility with 15% of Lt to facilitate usual self care, dressing, reaching overhead at PLOF s limitation due to symptoms.    Time 10    Period Weeks    Status New    Target Date 05/13/21      PT LONG TERM GOAL #5   Title Patient will demonstrate Rt UE MMT > or = 4/5  throughout to facilitate usual lifting, carrying in functional activity to PLOF s limitation.    Time 10    Period Weeks    Status New    Target  Date 05/13/21                    Plan - 03/18/21 1040     Clinical Impression Statement Following recent MD visit and note, progression to include AAROM transitioning and isometric holds in HEP c good overall tolerance in clinic.  Lat release techniques seemed to help with flexion mobility.    Examination-Activity Limitations Reach Overhead;Caring for Others;Carry;Dressing;Lift;Sleep    Examination-Participation Restrictions Cleaning;Community Activity;Driving;Interpersonal Relationship;Occupation    Stability/Clinical Decision Making Stable/Uncomplicated    Rehab Potential --   fair to good   PT Frequency 2x / week    PT Duration Other (comment)   10 weeks   PT Treatment/Interventions ADLs/Self Care Home Management;Cryotherapy;Electrical Stimulation;Therapeutic exercise;Iontophoresis '4mg'$ /ml Dexamethasone;Moist Heat;Traction;Balance training;Therapeutic activities;Functional mobility training;Stair training;Gait training;DME Instruction;Ultrasound;Neuromuscular re-education;Patient/family education;Passive range of motion;Spinal Manipulations;Joint Manipulations;Dry needling;Taping;Vasopneumatic Device;Manual techniques    PT Next Visit Plan Manual mobs c lat release,  AAROM transitioning, isometrics (no movement against gravity to avoid shrug)    PT Home Exercise Plan [email or web address]    Consulted and Agree with Plan of Care Patient             Patient will benefit from skilled therapeutic intervention in order to improve the following deficits and impairments:  Decreased endurance, Hypomobility, Increased edema, Decreased activity tolerance, Decreased strength, Impaired UE functional use, Pain, Decreased mobility, Impaired perceived functional ability, Improper body mechanics, Postural dysfunction, Impaired flexibility, Decreased coordination, Decreased range of motion  Visit Diagnosis: Chronic right shoulder pain  Stiffness of right shoulder, not elsewhere classified  Muscle weakness  (generalized)  Abnormal posture  Localized edema     Problem List Patient Active Problem List   Diagnosis Date Noted   Complete tear of right rotator cuff    Biceps tendonitis on right    S/P rotator cuff repair 02/08/2021   Chest discomfort 01/26/2014   Palpitations 01/26/2014   Cellulitis 09/08/2013   Abdominal pain, left mid abdomen, chronic 02/26/2013   Hyponatremia 10/30/2012   COPD, questioned 01/02/2012   Allergic rhinitis due to pollen 10/20/2010   MITRAL VALVE PROLAPSE, HX OF 05/26/2009   UNSPECIFIED HEARING LOSS 05/21/2009   RHINOSINUSITIS, 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    Scot Jun, PT, DPT, OCS, ATC 03/18/21  10:50 AM    [facility] [address] [place], [place], [address] Phone: [phone]   Fax:  [phone]  Name: Haley Wade MRN: [id number] Date of Birth: 01/03/1946

## 2021-03-18 NOTE — Patient Instructions (Signed)
Access Code: [email or web address] URL: [email or web address] Date: 03/18/2021 Prepared by: Scot Jun  Exercises Supine Shoulder External Rotation with [place] (Mirrored) - 3 x daily - 7 x weekly - 2 sets - 10 reps - 5 hold Seated Scapular Retraction - 2 x daily - 7 x weekly - 2 sets - 10 reps - 5 hold Supine Shoulder Flexion Extension AAROM with [place] - 2 x daily - 7 x weekly - 10 reps - 3 sets - 5 hold Isometric Shoulder Flexion at Wall (Mirrored) - 2 x daily - 7 x weekly - 1 sets - 15 reps - 5 hold Standing Isometric Shoulder Abduction with Doorway - Arm Bent (Mirrored) - 2 x daily - 7 x weekly - 1 sets - 15 reps - 5 hold Standing Isometric Shoulder External Rotation with Doorway (Mirrored) - 2 x daily - 7 x weekly - 1 sets - 15 reps - 5 hold Standing Isometric Shoulder Internal Rotation at Doorway - 2 x daily - 7 x weekly - 1 sets - 15 reps - 5 hold

## 2021-03-21 ENCOUNTER — Encounter: Payer: Self-pay | Admitting: Rehabilitative and Restorative Service Providers"

## 2021-03-21 ENCOUNTER — Other Ambulatory Visit: Payer: Self-pay

## 2021-03-21 ENCOUNTER — Ambulatory Visit (INDEPENDENT_AMBULATORY_CARE_PROVIDER_SITE_OTHER): Payer: Medicare Other | Admitting: Rehabilitative and Restorative Service Providers"

## 2021-03-21 DIAGNOSIS — M25611 Stiffness of right shoulder, not elsewhere classified: Secondary | ICD-10-CM | POA: Diagnosis not present

## 2021-03-21 DIAGNOSIS — G8929 Other chronic pain: Secondary | ICD-10-CM

## 2021-03-21 DIAGNOSIS — R293 Abnormal posture: Secondary | ICD-10-CM

## 2021-03-21 DIAGNOSIS — M6281 Muscle weakness (generalized): Secondary | ICD-10-CM

## 2021-03-21 DIAGNOSIS — M25511 Pain in right shoulder: Secondary | ICD-10-CM

## 2021-03-21 DIAGNOSIS — R600 Localized edema: Secondary | ICD-10-CM | POA: Diagnosis not present

## 2021-03-21 NOTE — Therapy (Signed)
[facility] [address] [place], [place], [address] Phone: [phone]   Fax:  [phone]  Physical Therapy Treatment  Patient Details  Name: Haley Wade MRN: [id number] Date of Birth: 04-Aug-1946 Referring Provider (PT): Dr. Meredith Pel   Encounter Date: 03/21/2021   PT End of Session - 03/21/21 1621     Visit Number 6    Number of Visits 20    Date for PT Re-Evaluation 05/13/21    Authorization Type Medicare, AARP    Progress Note Due on Visit 10    PT Start Time [id number]    PT Stop Time 1622    PT Time Calculation (min) 28 min    Activity Tolerance Patient limited by pain    Behavior During Therapy [facility] for tasks assessed/performed             Past Medical History:  Diagnosis Date   Allergic rhinitis, cause unspecified    Anemia    Arthritis    Cellulitis    Chest discomfort    COPD, questioned    GAVE (gastric antral vascular ectasia)    Hyponatremia    MITRAL VALVE PROLAPSE, HX OF    Other chronic sinusitis    Palpitations    Syncope    Unspecified hearing loss     Past Surgical History:  Procedure Laterality Date   APPENDECTOMY     BACK SURGERY     BICEPT TENODESIS Right 02/08/2021   Procedure: BICEPS TENODESIS;  Surgeon: Meredith Pel, MD;  Location: [place];  Service: Orthopedics;  Laterality: Right;   BLADDER SURGERY     BOWEL RESECTION N/A 11/01/2012   Procedure: SMALL BOWEL RESECTION;  Surgeon: Earnstine Regal, MD;  Location[facility];  Service: General;  Laterality: N/A;   BREAST ENHANCEMENT SURGERY     revisions   BREAST SURGERY     CHOLECYSTECTOMY     COLON SURGERY     ESOPHAGOGASTRODUODENOSCOPY (EGD) WITH PROPOFOL N/A 10/14/2019   Procedure: ESOPHAGOGASTRODUODENOSCOPY (EGD) WITH PROPOFOL;  Surgeon: Clarene Essex, MD;  [facility];  Service: Endoscopy;  Laterality: N/A;   EYE SURGERY     GI RADIOFREQUENCY ABLATION  10/14/2019   Procedure: GI RADIOFREQUENCY ABLATION;  Surgeon: Clarene Essex, MD;  [facility];  Service: Endoscopy;;   LAPAROSCOPY N/A 11/01/2012   Procedure: LAPAROSCOPY DIAGNOSTIC;  Surgeon: Earnstine Regal, MD;  [facility];  Service: General;  Laterality: N/A;   LAPAROTOMY N/A 11/01/2012   Procedure: EXPLORATORY LAPAROTOMY;  Surgeon: Earnstine Regal, MD;  [facility];  Service: General;  Laterality: N/A;   LYSIS OF ADHESION N/A 11/01/2012   Procedure: LYSIS OF ADHESION;  Surgeon: Earnstine Regal, MD;  Location[facility];  Service: General;  Laterality: N/A;   RHINOPLASTY     SHOULDER ARTHROSCOPY WITH OPEN ROTATOR CUFF REPAIR AND DISTAL CLAVICLE ACROMINECTOMY Right 02/08/2021   Procedure: SHOULDER ARTHROSCOPY WITH BICEPS TENDON RELEASE, MINI OPEN ROTATOR CUFF TEAR REPAIR OF THE INFRASPINATUS SUPRASPINATUS AND UPPER PORTIION OF THE SUBSCAP  WITH BICEPS TENODESIS;  Surgeon: Meredith Pel, MD;  Location: [place];  Service: Orthopedics;  Laterality: Right;   TONSILLECTOMY     TOTAL ABDOMINAL HYSTERECTOMY      There were no vitals filed for this visit.   Subjective Assessment - 03/21/21 1559     Subjective Pt. indicated she was doing ok until she reached into fridge for a plate and felt spasm in front of Rt upper arm and  sore after.    Limitations House hold activities;Lifting    Patient Stated Goals Reduce pain, improve arm movement.    Currently in Pain? Yes    Pain Score 9    9/10 in upper arm when occured   Pain Location Shoulder    Pain Orientation Right    Pain Descriptors / Indicators Aching;Sore    Pain Type Surgical pain;Chronic pain    Pain Onset More than a month ago    Pain Frequency Constant    Aggravating Factors  reaching into fridge    Pain Relieving Factors rest                               [facility] Adult PT Treatment/Exercise - 03/21/21 0001       Shoulder Exercises: ROM/Strengthening   UBE (Upper Arm Bike) Lvl 1.0 4 mins fwd, 4 mins backward      Manual Therapy   Manual therapy comments g3  inferior joint mobs, mobilization c movement c posterior glide for ER.  Compression to lat c muscle energy contract/relax techniques for elevation gains                      PT Short Term Goals - 03/21/21 1620       PT SHORT TERM GOAL #1   Title Patient will demonstrate independent use of home exercise program to maintain progress from in clinic treatments.    Time 3    Period Weeks    Status Achieved    Target Date 03/25/21               PT Long Term Goals - 03/04/21 0933       PT LONG TERM GOAL #1   Title Patient will demonstrate/report pain at worst less than or equal to 2/10 to facilitate minimal limitation in daily activity secondary to pain symptoms.    Time 10    Period Weeks    Status New    Target Date 05/13/21      PT LONG TERM GOAL #2   Title Patient will demonstrate independent use of home exercise program to facilitate ability to maintain/progress functional gains from skilled physical therapy services.    Time 10    Period Weeks    Status New    Target Date 05/13/21      PT LONG TERM GOAL #3   Title Pt. will demonstrate FOTO outcome > or = 58% to indicated reduced disability due to condition.    Time 10    Period Weeks    Status New    Target Date 05/13/21      PT LONG TERM GOAL #4   Title Patient will demonstrate Rt [place] joint mobility with 15% of Lt to facilitate usual self care, dressing, reaching overhead at PLOF s limitation due to symptoms.    Time 10    Period Weeks    Status New    Target Date 05/13/21      PT LONG TERM GOAL #5   Title Patient will demonstrate Rt UE MMT > or = 4/5  throughout to facilitate usual lifting, carrying in functional activity to PLOF s limitation.    Time 10    Period Weeks    Status New    Target Date 05/13/21                   Plan - 03/21/21  1624     Clinical Impression Statement Due to complaints of upper arm pain after reaching into fridge, reduced treatment today and no additions  in HEP.  Checking on movement today didn't reveal any loss compared to last visit c slight increase in sensitivity to movement.    Examination-Activity Limitations Reach Overhead;Caring for Others;Carry;Dressing;Lift;Sleep    Examination-Participation Restrictions Cleaning;Community Activity;Driving;Interpersonal Relationship;Occupation    Stability/Clinical Decision Making Stable/Uncomplicated    Rehab Potential --   fair to good   PT Frequency 2x / week    PT Duration Other (comment)   10 weeks   PT Treatment/Interventions ADLs/Self Care Home Management;Cryotherapy;Electrical Stimulation;Therapeutic exercise;Iontophoresis '4mg'$ /ml Dexamethasone;Moist Heat;Traction;Balance training;Therapeutic activities;Functional mobility training;Stair training;Gait training;DME Instruction;Ultrasound;Neuromuscular re-education;Patient/family education;Passive range of motion;Spinal Manipulations;Joint Manipulations;Dry needling;Taping;Vasopneumatic Device;Manual techniques    PT Next Visit Plan Manual mobs c lat release,  AAROM transitioning (was held today)(no movement against gravity to avoid shrug)    PT Home Exercise Plan [email or web address]    Consulted and Agree with Plan of Care Patient             Patient will benefit from skilled therapeutic intervention in order to improve the following deficits and impairments:  Decreased endurance, Hypomobility, Increased edema, Decreased activity tolerance, Decreased strength, Impaired UE functional use, Pain, Decreased mobility, Impaired perceived functional ability, Improper body mechanics, Postural dysfunction, Impaired flexibility, Decreased coordination, Decreased range of motion  Visit Diagnosis: Chronic right shoulder pain  Stiffness of right shoulder, not elsewhere classified  Muscle weakness (generalized)  Abnormal posture  Localized edema     Problem List Patient Active Problem List   Diagnosis Date Noted   Complete tear of right rotator cuff     Biceps tendonitis on right    S/P rotator cuff repair 02/08/2021   Chest discomfort 01/26/2014   Palpitations 01/26/2014   Cellulitis 09/08/2013   Abdominal pain, left mid abdomen, chronic 02/26/2013   Hyponatremia 10/30/2012   COPD, questioned 01/02/2012   Allergic rhinitis due to pollen 10/20/2010   MITRAL VALVE PROLAPSE, HX OF 05/26/2009   UNSPECIFIED HEARING LOSS 05/21/2009   RHINOSINUSITIS, 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   Scot Jun, PT, DPT, OCS, ATC 03/21/21  4:25 PM    [facility] [address] [place], [place], [address] Phone: [phone]   Fax:  [phone]  Name: JALEASA EBERHARD MRN: [id number] Date of Birth: 10-13-1945

## 2021-03-22 ENCOUNTER — Encounter: Payer: Medicare Other | Admitting: Rehabilitative and Restorative Service Providers"

## 2021-03-24 ENCOUNTER — Ambulatory Visit (INDEPENDENT_AMBULATORY_CARE_PROVIDER_SITE_OTHER): Payer: Medicare Other | Admitting: Rehabilitative and Restorative Service Providers"

## 2021-03-24 ENCOUNTER — Encounter: Payer: Self-pay | Admitting: Rehabilitative and Restorative Service Providers"

## 2021-03-24 ENCOUNTER — Other Ambulatory Visit: Payer: Self-pay

## 2021-03-24 DIAGNOSIS — M25611 Stiffness of right shoulder, not elsewhere classified: Secondary | ICD-10-CM

## 2021-03-24 DIAGNOSIS — R293 Abnormal posture: Secondary | ICD-10-CM | POA: Diagnosis not present

## 2021-03-24 DIAGNOSIS — M25511 Pain in right shoulder: Secondary | ICD-10-CM | POA: Diagnosis not present

## 2021-03-24 DIAGNOSIS — R600 Localized edema: Secondary | ICD-10-CM | POA: Diagnosis not present

## 2021-03-24 DIAGNOSIS — M6281 Muscle weakness (generalized): Secondary | ICD-10-CM | POA: Diagnosis not present

## 2021-03-24 DIAGNOSIS — G8929 Other chronic pain: Secondary | ICD-10-CM

## 2021-03-24 NOTE — Therapy (Signed)
[facility] [address] [place], [place], [address] Phone: [phone]   Fax:  [phone]  Physical Therapy Treatment  Patient Details  Name: Haley Wade MRN: [id number] Date of Birth: 05/17/1946 Referring Provider (PT): Dr. Meredith Pel   Encounter Date: 03/24/2021   PT End of Session - 03/24/21 1048     Visit Number 7    Number of Visits 20    Date for PT Re-Evaluation 05/13/21    Authorization Type Medicare, AARP    Progress Note Due on Visit 10    PT Start Time 1052    PT Stop Time 1132    PT Time Calculation (min) 40 min    Activity Tolerance Patient tolerated treatment well    Behavior During Therapy WFL for tasks assessed/performed             Past Medical History:  Diagnosis Date   Allergic rhinitis, cause unspecified    Anemia    Arthritis    Cellulitis    Chest discomfort    COPD, questioned    GAVE (gastric antral vascular ectasia)    Hyponatremia    MITRAL VALVE PROLAPSE, HX OF    Other chronic sinusitis    Palpitations    Syncope    Unspecified hearing loss     Past Surgical History:  Procedure Laterality Date   APPENDECTOMY     BACK SURGERY     BICEPT TENODESIS Right 02/08/2021   Procedure: BICEPS TENODESIS;  Surgeon: Meredith Pel, MD;  Location: [place];  Service: Orthopedics;  Laterality: Right;   BLADDER SURGERY     BOWEL RESECTION N/A 11/01/2012   Procedure: SMALL BOWEL RESECTION;  Surgeon: Earnstine Regal, MD;  [facility];  Service: General;  Laterality: N/A;   BREAST ENHANCEMENT SURGERY     revisions   BREAST SURGERY     CHOLECYSTECTOMY     COLON SURGERY     ESOPHAGOGASTRODUODENOSCOPY (EGD) WITH PROPOFOL N/A 10/14/2019   Procedure: ESOPHAGOGASTRODUODENOSCOPY (EGD) WITH PROPOFOL;  Surgeon: Clarene Essex, MD;  [facility];  Service: Endoscopy;  Laterality: N/A;   EYE SURGERY     GI RADIOFREQUENCY ABLATION  10/14/2019   Procedure: GI RADIOFREQUENCY ABLATION;   Surgeon: Clarene Essex, MD;  [facility];  Service: Endoscopy;;   LAPAROSCOPY N/A 11/01/2012   Procedure: LAPAROSCOPY DIAGNOSTIC;  Surgeon: Earnstine Regal, MD;  [facility];  Service: General;  Laterality: N/A;   LAPAROTOMY N/A 11/01/2012   Procedure: EXPLORATORY LAPAROTOMY;  Surgeon: Earnstine Regal, MD;  [facility];  Service: General;  Laterality: N/A;   LYSIS OF ADHESION N/A 11/01/2012   Procedure: LYSIS OF ADHESION;  Surgeon: Earnstine Regal, MD;  Location[facility];  Service: General;  Laterality: N/A;   RHINOPLASTY     SHOULDER ARTHROSCOPY WITH OPEN ROTATOR CUFF REPAIR AND DISTAL CLAVICLE ACROMINECTOMY Right 02/08/2021   Procedure: SHOULDER ARTHROSCOPY WITH BICEPS TENDON RELEASE, MINI OPEN ROTATOR CUFF TEAR REPAIR OF THE INFRASPINATUS SUPRASPINATUS AND UPPER PORTIION OF THE SUBSCAP  WITH BICEPS TENODESIS;  Surgeon: Meredith Pel, MD;  Location: [place];  Service: Orthopedics;  Laterality: Right;   TONSILLECTOMY     TOTAL ABDOMINAL HYSTERECTOMY      There were no vitals filed for this visit.   Subjective Assessment - 03/24/21 1051     Subjective Pt. indicated that pain indicated was better in upper arm.  Pt. indicated she was able to reach igitiion of car.  Limitations House hold activities;Lifting    Patient Stated Goals Reduce pain, improve arm movement.    Currently in Pain? Yes    Pain Score 4     Pain Location Shoulder    Pain Orientation Right    Pain Descriptors / Indicators Aching;Sore    Pain Onset More than a month ago    Pain Frequency Intermittent    Aggravating Factors  end ranges    Pain Relieving Factors rest                [facility] PT Assessment - 03/24/21 0001       Assessment   Medical Diagnosis Rt RTC repair    Referring Provider (PT) Dr. Meredith Pel    Onset Date/Surgical Date 08/14/20    Hand Dominance Right      AROM   Right/Left Shoulder Right    Right Shoulder Flexion 130 Degrees   in supine                           [facility] Adult PT Treatment/Exercise - 03/24/21 0001       Shoulder Exercises: Supine   Horizontal ABduction Both   2 x10   Flexion AAROM;Both   1 lb bar 2 x 10 2-3 sec hold   Other Supine Exercises supine active flexion x 20      Shoulder Exercises: Sidelying   External Rotation Right   3 x 10   ABduction Right   2 x 10     Shoulder Exercises: ROM/Strengthening   UBE (Upper Arm Bike) Lvl 1.5 3 mins fwd/back each way      Manual Therapy   Manual therapy comments g3 inferior joint mobs, mobilization c movement c posterior glide for ER.  Compression to lat c muscle energy contract/relax techniques for elevation gains                      PT Short Term Goals - 03/21/21 1620       PT SHORT TERM GOAL #1   Title Patient will demonstrate independent use of home exercise program to maintain progress from in clinic treatments.    Time 3    Period Weeks    Status Achieved    Target Date 03/25/21               PT Long Term Goals - 03/24/21 1110       PT LONG TERM GOAL #1   Title Patient will demonstrate/report pain at worst less than or equal to 2/10 to facilitate minimal limitation in daily activity secondary to pain symptoms.    Time 10    Period Weeks    Status On-going    Target Date 05/13/21      PT LONG TERM GOAL #2   Title Patient will demonstrate independent use of home exercise program to facilitate ability to maintain/progress functional gains from skilled physical therapy services.    Time 10    Period Weeks    Status On-going    Target Date 05/13/21      PT LONG TERM GOAL #3   Title Pt. will demonstrate FOTO outcome > or = 58% to indicated reduced disability due to condition.    Time 10    Period Weeks    Status On-going    Target Date 05/13/21      PT LONG TERM GOAL #4   Title Patient will demonstrate Rt  [place] joint mobility with 15% of Lt to facilitate usual self care, dressing, reaching overhead at PLOF s  limitation due to symptoms.    Time 10    Period Weeks    Status On-going    Target Date 05/13/21      PT LONG TERM GOAL #5   Title Patient will demonstrate Rt UE MMT > or = 4/5  throughout to facilitate usual lifting, carrying in functional activity to PLOF s limitation.    Time 10    Period Weeks    Status On-going    Target Date 05/13/21                   Plan - 03/24/21 1111     Clinical Impression Statement Improvement in mobility as noted c measurement of active movement for first time today in flexion in supine.  Continued inclusion of active mobility exercise today to improve muscle activation while continued working on mobility at end range.  Continued skilled PT services.    Examination-Activity Limitations Reach Overhead;Caring for Others;Carry;Dressing;Lift;Sleep    Examination-Participation Restrictions Cleaning;Community Activity;Driving;Interpersonal Relationship;Occupation    Stability/Clinical Decision Making Stable/Uncomplicated    Rehab Potential --   fair to good   PT Frequency 2x / week    PT Duration Other (comment)   10 weeks   PT Treatment/Interventions ADLs/Self Care Home Management;Cryotherapy;Electrical Stimulation;Therapeutic exercise;Iontophoresis '4mg'$ /ml Dexamethasone;Moist Heat;Traction;Balance training;Therapeutic activities;Functional mobility training;Stair training;Gait training;DME Instruction;Ultrasound;Neuromuscular re-education;Patient/family education;Passive range of motion;Spinal Manipulations;Joint Manipulations;Dry needling;Taping;Vasopneumatic Device;Manual techniques    PT Next Visit Plan Manual mobs c lat release,  AROM transitioning    PT Home Exercise Plan [email or web address]    Consulted and Agree with Plan of Care Patient             Patient will benefit from skilled therapeutic intervention in order to improve the following deficits and impairments:  Decreased endurance, Hypomobility, Increased edema, Decreased activity tolerance,  Decreased strength, Impaired UE functional use, Pain, Decreased mobility, Impaired perceived functional ability, Improper body mechanics, Postural dysfunction, Impaired flexibility, Decreased coordination, Decreased range of motion  Visit Diagnosis: Chronic right shoulder pain  Stiffness of right shoulder, not elsewhere classified  Muscle weakness (generalized)  Abnormal posture  Localized edema     Problem List Patient Active Problem List   Diagnosis Date Noted   Complete tear of right rotator cuff    Biceps tendonitis on right    S/P rotator cuff repair 02/08/2021   Chest discomfort 01/26/2014   Palpitations 01/26/2014   Cellulitis 09/08/2013   Abdominal pain, left mid abdomen, chronic 02/26/2013   Hyponatremia 10/30/2012   COPD, questioned 01/02/2012   Allergic rhinitis due to pollen 10/20/2010   MITRAL VALVE PROLAPSE, HX OF 05/26/2009   UNSPECIFIED HEARING LOSS 05/21/2009   RHINOSINUSITIS, 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   Scot Jun, PT, DPT, OCS, ATC 03/24/21  11:23 AM    [facility] [address] [place], [place], [address] Phone: [phone]   Fax:  [phone]  Name: TALEENA FARLING MRN: [id number] Date of Birth: 1946/02/17

## 2021-03-29 ENCOUNTER — Ambulatory Visit (INDEPENDENT_AMBULATORY_CARE_PROVIDER_SITE_OTHER): Payer: Medicare Other | Admitting: Rehabilitative and Restorative Service Providers"

## 2021-03-29 ENCOUNTER — Other Ambulatory Visit: Payer: Self-pay

## 2021-03-29 ENCOUNTER — Encounter: Payer: Self-pay | Admitting: Rehabilitative and Restorative Service Providers"

## 2021-03-29 DIAGNOSIS — R293 Abnormal posture: Secondary | ICD-10-CM

## 2021-03-29 DIAGNOSIS — M6281 Muscle weakness (generalized): Secondary | ICD-10-CM

## 2021-03-29 DIAGNOSIS — M25611 Stiffness of right shoulder, not elsewhere classified: Secondary | ICD-10-CM | POA: Diagnosis not present

## 2021-03-29 DIAGNOSIS — G8929 Other chronic pain: Secondary | ICD-10-CM | POA: Diagnosis not present

## 2021-03-29 DIAGNOSIS — R600 Localized edema: Secondary | ICD-10-CM

## 2021-03-29 DIAGNOSIS — M25511 Pain in right shoulder: Secondary | ICD-10-CM | POA: Diagnosis not present

## 2021-03-29 NOTE — Therapy (Signed)
[facility] [address] [place], [place], [address] Phone: [phone]   Fax:  [phone]  Physical Therapy Treatment  Patient Details  Name: Haley Wade MRN: [id number] Date of Birth: [birth year] Referring Provider (PT): Dr. Meredith Pel   Encounter Date: 03/29/2021   PT End of Session - 03/29/21 1104     Visit Number 8    Number of Visits 20    Date for PT Re-Evaluation 05/13/21    Authorization Type Medicare, AARP    Progress Note Due on Visit 10    PT Start Time 1053    PT Stop Time 1135    PT Time Calculation (min) 42 min    Activity Tolerance Patient tolerated treatment well    Behavior During Therapy WFL for tasks assessed/performed             Past Medical History:  Diagnosis Date   Allergic rhinitis, cause unspecified    Anemia    Arthritis    Cellulitis    Chest discomfort    COPD, questioned    GAVE (gastric antral vascular ectasia)    Hyponatremia    MITRAL VALVE PROLAPSE, HX OF    Other chronic sinusitis    Palpitations    Syncope    Unspecified hearing loss     Past Surgical History:  Procedure Laterality Date   APPENDECTOMY     BACK SURGERY     BICEPT TENODESIS Right 02/08/2021   Procedure: BICEPS TENODESIS;  Surgeon: Meredith Pel, MD;  Location: [place];  Service: Orthopedics;  Laterality: Right;   BLADDER SURGERY     BOWEL RESECTION N/A 11/01/2012   Procedure: SMALL BOWEL RESECTION;  Surgeon: Earnstine Regal, MD;  Location: [facility];  Service: General;  Laterality: N/A;   BREAST ENHANCEMENT SURGERY     revisions   BREAST SURGERY     CHOLECYSTECTOMY     COLON SURGERY     ESOPHAGOGASTRODUODENOSCOPY (EGD) WITH PROPOFOL N/A 10/14/2019   Procedure: ESOPHAGOGASTRODUODENOSCOPY (EGD) WITH PROPOFOL;  Surgeon: Clarene Essex, MD;  [facility];  Service: Endoscopy;  Laterality: N/A;   EYE SURGERY     GI RADIOFREQUENCY ABLATION  10/14/2019   Procedure: GI RADIOFREQUENCY ABLATION;   Surgeon: Clarene Essex, MD;  [facility]: [facility];  Service: Endoscopy;;   LAPAROSCOPY N/A 11/01/2012   Procedure: LAPAROSCOPY DIAGNOSTIC;  Surgeon: Earnstine Regal, MD;  Location: [facility];  Service: General;  Laterality: N/A;   LAPAROTOMY N/A 11/01/2012   Procedure: EXPLORATORY LAPAROTOMY;  Surgeon: Earnstine Regal, MD;  Location[facility];  Service: General;  Laterality: N/A;   LYSIS OF ADHESION N/A 11/01/2012   Procedure: LYSIS OF ADHESION;  Surgeon: Earnstine Regal, MD;  Location: [facility];  Service: General;  Laterality: N/A;   RHINOPLASTY     SHOULDER ARTHROSCOPY WITH OPEN ROTATOR CUFF REPAIR AND DISTAL CLAVICLE ACROMINECTOMY Right 02/08/2021   Procedure: SHOULDER ARTHROSCOPY WITH BICEPS TENDON RELEASE, MINI OPEN ROTATOR CUFF TEAR REPAIR OF THE INFRASPINATUS SUPRASPINATUS AND UPPER PORTIION OF THE SUBSCAP  WITH BICEPS TENODESIS;  Surgeon: Meredith Pel, MD;  Location: [place];  Service: Orthopedics;  Laterality: Right;   TONSILLECTOMY     TOTAL ABDOMINAL HYSTERECTOMY      There were no vitals filed for this visit.   Subjective Assessment - 03/29/21 1100     Subjective Pt. indicated another occurence of pain in biceps region after dog walking where arm was pulled.  Pt. stated noticing some tenderness in anterior  upper arm. Pt. indicated shoulder movement continued to show gains. No much pain except at end range stretching.  Sleeping better.    Limitations House hold activities;Lifting    Patient Stated Goals Reduce pain, improve arm movement.    Currently in Pain? Yes    Pain Score 2     Pain Orientation Right    Pain Descriptors / Indicators Sore;Aching    Pain Type Surgical pain;Chronic pain    Pain Onset More than a month ago    Pain Frequency Intermittent    Aggravating Factors  end range stretching, some ache at rest sometimes    Pain Relieving Factors resting                [facility] PT Assessment - 03/29/21 0001       Assessment   Medical Diagnosis Rt RTC repair     Referring Provider (PT) Dr. Meredith Pel    Onset Date/Surgical Date 08/14/20    Hand Dominance Right      Strength   Overall Strength Comments Isometric Rt elbow flexion testing revealed fairly strong, very mild discomfort with testing      Palpation   Palpation comment Mild tenderness middle 1/3 of anterior upper arm Rt                           [facility] Adult PT Treatment/Exercise - 03/29/21 0001       Neuro Re-ed    Neuro Re-ed Details  stabilizations in 90 deg flexion in supine c mild resistance 15-20 sec bouts      Shoulder Exercises: Supine   Horizontal ABduction Both   red band 2 x 10   Theraband Level (Shoulder Horizontal ABduction) Level 2 (Red)    Flexion PROM   Lt hand assistance x 10   Other Supine Exercises supine active flexion 1 lb x 5 (continued elbow flexion compensation), x 20      Shoulder Exercises: Sidelying   External Rotation Right   3 x 10   ABduction Right   3 x 10     Shoulder Exercises: ROM/Strengthening   UBE (Upper Arm Bike) Lvl 3.0 4 mins fwd/back each way      Manual Therapy   Manual therapy comments g4 inferior jt mobs in flexion, scaption and abductoin Rt GH joint.  contract/relax to lat for elevation, external rotators for IR mobility gains                    PT Education - 03/29/21 1133     Education Details HEP updates per instructions.    Person(s) Educated Patient    Methods Explanation;Demonstration;Verbal cues;Handout    Comprehension Returned demonstration;Verbalized understanding              PT Short Term Goals - 03/21/21 1620       PT SHORT TERM GOAL #1   Title Patient will demonstrate independent use of home exercise program to maintain progress from in clinic treatments.    Time 3    Period Weeks    Status Achieved    Target Date 03/25/21               PT Long Term Goals - 03/24/21 1110       PT LONG TERM GOAL #1   Title Patient will demonstrate/report pain at worst less  than or equal to 2/10 to facilitate minimal limitation in daily activity secondary to pain  symptoms.    Time 10    Period Weeks    Status On-going    Target Date 05/13/21      PT LONG TERM GOAL #2   Title Patient will demonstrate independent use of home exercise program to facilitate ability to maintain/progress functional gains from skilled physical therapy services.    Time 10    Period Weeks    Status On-going    Target Date 05/13/21      PT LONG TERM GOAL #3   Title Pt. will demonstrate FOTO outcome > or = 58% to indicated reduced disability due to condition.    Time 10    Period Weeks    Status On-going    Target Date 05/13/21      PT LONG TERM GOAL #4   Title Patient will demonstrate Rt [place] joint mobility with 15% of Lt to facilitate usual self care, dressing, reaching overhead at PLOF s limitation due to symptoms.    Time 10    Period Weeks    Status On-going    Target Date 05/13/21      PT LONG TERM GOAL #5   Title Patient will demonstrate Rt UE MMT > or = 4/5  throughout to facilitate usual lifting, carrying in functional activity to PLOF s limitation.    Time 10    Period Weeks    Status On-going    Target Date 05/13/21                   Plan - 03/29/21 1119     Clinical Impression Statement Pt. reported another occurence of anterior upper arm pain c home reaching/pulling, this time c dog walking.  Noted pain in area that was sore in upper arm and some mild tenderness.  No negative impact on Rt shoulder mobility/symptoms as compared to previous.  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  Possible differential clinical reasoning indicates possible biceps tendon involvement.    Examination-Activity Limitations Reach Overhead;Caring for Others;Carry;Dressing;Lift;Sleep    Examination-Participation Restrictions Cleaning;Community Activity;Driving;Interpersonal Relationship;Occupation    Stability/Clinical  Decision Making Stable/Uncomplicated    Rehab Potential --   fair to good   PT Frequency 2x / week    PT Duration Other (comment)   10 weeks   PT Treatment/Interventions ADLs/Self Care Home Management;Cryotherapy;Electrical Stimulation;Therapeutic exercise;Iontophoresis '4mg'$ /ml Dexamethasone;Moist Heat;Traction;Balance training;Therapeutic activities;Functional mobility training;Stair training;Gait training;DME Instruction;Ultrasound;Neuromuscular re-education;Patient/family education;Passive range of motion;Spinal Manipulations;Joint Manipulations;Dry needling;Taping;Vasopneumatic Device;Manual techniques    PT Next Visit Plan Recheck upper arm presentation, mobilization for mobility gains, continued active mobility and light strengthening.    PT Home Exercise Plan [email or web address]    Consulted and Agree with Plan of Care Patient             Patient will benefit from skilled therapeutic intervention in order to improve the following deficits and impairments:  Decreased endurance, Hypomobility, Increased edema, Decreased activity tolerance, Decreased strength, Impaired UE functional use, Pain, Decreased mobility, Impaired perceived functional ability, Improper body mechanics, Postural dysfunction, Impaired flexibility, Decreased coordination, Decreased range of motion  Visit Diagnosis: Chronic right shoulder pain  Stiffness of right shoulder, not elsewhere classified  Muscle weakness (generalized)  Abnormal posture  Localized edema     Problem List Patient Active Problem List   Diagnosis Date Noted   Complete tear of right rotator cuff    Biceps tendonitis on right    S/P rotator cuff repair 02/08/2021   Chest discomfort 01/26/2014  Palpitations 01/26/2014   Cellulitis 09/08/2013   Abdominal pain, left mid abdomen, chronic 02/26/2013   Hyponatremia 10/30/2012   COPD, questioned 01/02/2012   Allergic rhinitis due to pollen 10/20/2010   MITRAL VALVE PROLAPSE, HX OF 05/26/2009    UNSPECIFIED HEARING LOSS 05/21/2009   RHINOSINUSITIS, 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    Scot Jun, PT, DPT, OCS, ATC 03/29/21  11:35 AM    [facility] [address] [place], [place], [address] Phone: [phone]   Fax:  [phone]  Name: Haley Wade MRN: [id number] Date of Birth: 06/28/1946

## 2021-03-29 NOTE — Patient Instructions (Signed)
Access Code: [email or web address] URL: [email or web address] Date: 03/29/2021 Prepared by: Scot Jun  Exercises Supine Shoulder External Rotation with Dowel (Mirrored) - 3 x daily - 7 x weekly - 2 sets - 10 reps - 5 hold Seated Scapular Retraction - 2 x daily - 7 x weekly - 2 sets - 10 reps - 5 hold Supine Shoulder Flexion Extension AAROM with Dowel - 2 x daily - 7 x weekly - 10 reps - 3 sets - 5 hold Sidelying Shoulder Abduction Palm Forward - 2 x daily - 7 x weekly - 3 sets - 10-15 reps Sidelying Shoulder External Rotation (Mirrored) - 2 x daily - 7 x weekly - 3 sets - 10 reps

## 2021-03-31 ENCOUNTER — Other Ambulatory Visit: Payer: Self-pay

## 2021-03-31 ENCOUNTER — Ambulatory Visit (INDEPENDENT_AMBULATORY_CARE_PROVIDER_SITE_OTHER): Payer: Medicare Other | Admitting: Rehabilitative and Restorative Service Providers"

## 2021-03-31 DIAGNOSIS — R600 Localized edema: Secondary | ICD-10-CM | POA: Diagnosis not present

## 2021-03-31 DIAGNOSIS — M25511 Pain in right shoulder: Secondary | ICD-10-CM | POA: Diagnosis not present

## 2021-03-31 DIAGNOSIS — M6281 Muscle weakness (generalized): Secondary | ICD-10-CM

## 2021-03-31 DIAGNOSIS — R293 Abnormal posture: Secondary | ICD-10-CM

## 2021-03-31 DIAGNOSIS — M25611 Stiffness of right shoulder, not elsewhere classified: Secondary | ICD-10-CM

## 2021-03-31 DIAGNOSIS — G8929 Other chronic pain: Secondary | ICD-10-CM | POA: Diagnosis not present

## 2021-03-31 NOTE — Therapy (Signed)
[facility] [address] [place], [place], [address] Phone: [phone]   Fax:  [phone]  Physical Therapy Treatment  Patient Details  Name: Haley Wade MRN: [id number] Date of Birth: 05-15-1946 Referring Provider (PT): Dr. Meredith Pel   Encounter Date: 03/31/2021   PT End of Session - 03/31/21 1042     Visit Number 9    Number of Visits 20    Date for PT Re-Evaluation 05/13/21    Authorization Type Medicare, AARP    Progress Note Due on Visit 10    PT Start Time 1045    PT Stop Time 1124    PT Time Calculation (min) 39 min    Activity Tolerance Patient tolerated treatment well    Behavior During Therapy WFL for tasks assessed/performed             Past Medical History:  Diagnosis Date   Allergic rhinitis, cause unspecified    Anemia    Arthritis    Cellulitis    Chest discomfort    COPD, questioned    GAVE (gastric antral vascular ectasia)    Hyponatremia    MITRAL VALVE PROLAPSE, HX OF    Other chronic sinusitis    Palpitations    Syncope    Unspecified hearing loss     Past Surgical History:  Procedure Laterality Date   APPENDECTOMY     BACK SURGERY     BICEPT TENODESIS Right 02/08/2021   Procedure: BICEPS TENODESIS;  Surgeon: Meredith Pel, MD;  Location: [place];  Service: Orthopedics;  Laterality: Right;   BLADDER SURGERY     BOWEL RESECTION N/A 11/01/2012   Procedure: SMALL BOWEL RESECTION;  Surgeon: Earnstine Regal, MD;  Location: [facility];  Service: General;  Laterality: N/A;   BREAST ENHANCEMENT SURGERY     revisions   BREAST SURGERY     CHOLECYSTECTOMY     COLON SURGERY     ESOPHAGOGASTRODUODENOSCOPY (EGD) WITH PROPOFOL N/A 10/14/2019   Procedure: ESOPHAGOGASTRODUODENOSCOPY (EGD) WITH PROPOFOL;  Surgeon: Clarene Essex, MD;  [facility];  Service: Endoscopy;  Laterality: N/A;   EYE SURGERY     GI RADIOFREQUENCY ABLATION  10/14/2019   Procedure: GI RADIOFREQUENCY ABLATION;   Surgeon: Clarene Essex, MD;  [facility];  Service: Endoscopy;;   LAPAROSCOPY N/A 11/01/2012   Procedure: LAPAROSCOPY DIAGNOSTIC;  Surgeon: Earnstine Regal, MD;  Location: [facility];  Service: General;  Laterality: N/A;   LAPAROTOMY N/A 11/01/2012   Procedure: EXPLORATORY LAPAROTOMY;  Surgeon: Earnstine Regal, MD;  Location: [facility];  Service: General;  Laterality: N/A;   LYSIS OF ADHESION N/A 11/01/2012   Procedure: LYSIS OF ADHESION;  Surgeon: Earnstine Regal, MD;  Location: [facility];  Service: General;  Laterality: N/A;   RHINOPLASTY     SHOULDER ARTHROSCOPY WITH OPEN ROTATOR CUFF REPAIR AND DISTAL CLAVICLE ACROMINECTOMY Right 02/08/2021   Procedure: SHOULDER ARTHROSCOPY WITH BICEPS TENDON RELEASE, MINI OPEN ROTATOR CUFF TEAR REPAIR OF THE INFRASPINATUS SUPRASPINATUS AND UPPER PORTIION OF THE SUBSCAP  WITH BICEPS TENODESIS;  Surgeon: Meredith Pel, MD;  Location: [place];  Service: Orthopedics;  Laterality: Right;   TONSILLECTOMY     TOTAL ABDOMINAL HYSTERECTOMY      There were no vitals filed for this visit.   Subjective Assessment - 03/31/21 1110     Subjective Pt. indicated tenderness was reduced since last visit in upper arm.  Noted the "bump" the same.  No pain at rest.  Limitations House hold activities;Lifting    Patient Stated Goals Reduce pain, improve arm movement.    Currently in Pain? No/denies    Pain Score 0-No pain    Pain Onset More than a month ago                               [facility] Adult PT Treatment/Exercise - 03/31/21 0001       Shoulder Exercises: Sidelying   External Rotation Right   3 x 10 c towel at side   External Rotation Weight (lbs) 1    Flexion Right   2 x 10   ABduction Right   3 x 10 (cues to stay on side)     Shoulder Exercises: Pulleys   Flexion 3 minutes    Scaption 3 minutes    Other Pulley Exercises 5 sec hold for pulley, elbow relaxed      Shoulder Exercises: ROM/Strengthening   UBE (Upper Arm Bike) Lvl 3.0 4  mins fwd/back each way                      PT Short Term Goals - 03/21/21 1620       PT SHORT TERM GOAL #1   Title Patient will demonstrate independent use of home exercise program to maintain progress from in clinic treatments.    Time 3    Period Weeks    Status Achieved    Target Date 03/25/21               PT Long Term Goals - 03/24/21 1110       PT LONG TERM GOAL #1   Title Patient will demonstrate/report pain at worst less than or equal to 2/10 to facilitate minimal limitation in daily activity secondary to pain symptoms.    Time 10    Period Weeks    Status On-going    Target Date 05/13/21      PT LONG TERM GOAL #2   Title Patient will demonstrate independent use of home exercise program to facilitate ability to maintain/progress functional gains from skilled physical therapy services.    Time 10    Period Weeks    Status On-going    Target Date 05/13/21      PT LONG TERM GOAL #3   Title Pt. will demonstrate FOTO outcome > or = 58% to indicated reduced disability due to condition.    Time 10    Period Weeks    Status On-going    Target Date 05/13/21      PT LONG TERM GOAL #4   Title Patient will demonstrate Rt [place] joint mobility with 15% of Lt to facilitate usual self care, dressing, reaching overhead at PLOF s limitation due to symptoms.    Time 10    Period Weeks    Status On-going    Target Date 05/13/21      PT LONG TERM GOAL #5   Title Patient will demonstrate Rt UE MMT > or = 4/5  throughout to facilitate usual lifting, carrying in functional activity to PLOF s limitation.    Time 10    Period Weeks    Status On-going    Target Date 05/13/21                   Plan - 03/31/21 1111     Clinical Impression Statement Recommendation of pulley for home to continue  advancement of mobility.  Continued transition in active mobility training (sidelying) to improve active mobility quality and control.  Lacking proprioceptive  control in movement.  Shrug noted above 50 deg forward elevation against gravity on Rt side.    Examination-Activity Limitations Reach Overhead;Caring for Others;Carry;Dressing;Lift;Sleep    Examination-Participation Restrictions Cleaning;Community Activity;Driving;Interpersonal Relationship;Occupation    Stability/Clinical Decision Making Stable/Uncomplicated    Rehab Potential --   fair to good   PT Frequency 2x / week    PT Duration Other (comment)   10 weeks   PT Treatment/Interventions ADLs/Self Care Home Management;Cryotherapy;Electrical Stimulation;Therapeutic exercise;Iontophoresis '4mg'$ /ml Dexamethasone;Moist Heat;Traction;Balance training;Therapeutic activities;Functional mobility training;Stair training;Gait training;DME Instruction;Ultrasound;Neuromuscular re-education;Patient/family education;Passive range of motion;Spinal Manipulations;Joint Manipulations;Dry needling;Taping;Vasopneumatic Device;Manual techniques    PT Next Visit Plan mobilization for mobility gains as necessary, continued active mobility and light strengthening.   10th visit PN /FOTO    PT Home Exercise Plan [email or web address]    Consulted and Agree with Plan of Care Patient             Patient will benefit from skilled therapeutic intervention in order to improve the following deficits and impairments:  Decreased endurance, Hypomobility, Increased edema, Decreased activity tolerance, Decreased strength, Impaired UE functional use, Pain, Decreased mobility, Impaired perceived functional ability, Improper body mechanics, Postural dysfunction, Impaired flexibility, Decreased coordination, Decreased range of motion  Visit Diagnosis: Chronic right shoulder pain  Stiffness of right shoulder, not elsewhere classified  Muscle weakness (generalized)  Abnormal posture  Localized edema     Problem List Patient Active Problem List   Diagnosis Date Noted   Complete tear of right rotator cuff    Biceps tendonitis on right     S/P rotator cuff repair 02/08/2021   Chest discomfort 01/26/2014   Palpitations 01/26/2014   Cellulitis 09/08/2013   Abdominal pain, left mid abdomen, chronic 02/26/2013   Hyponatremia 10/30/2012   COPD, questioned 01/02/2012   Allergic rhinitis due to pollen 10/20/2010   MITRAL VALVE PROLAPSE, HX OF 05/26/2009   UNSPECIFIED HEARING LOSS 05/21/2009   RHINOSINUSITIS, 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

## 2021-04-05 ENCOUNTER — Encounter: Payer: Medicare Other | Admitting: Rehabilitative and Restorative Service Providers"

## 2021-04-07 ENCOUNTER — Other Ambulatory Visit: Payer: Self-pay

## 2021-04-07 ENCOUNTER — Encounter: Payer: Self-pay | Admitting: Rehabilitative and Restorative Service Providers"

## 2021-04-07 ENCOUNTER — Ambulatory Visit (INDEPENDENT_AMBULATORY_CARE_PROVIDER_SITE_OTHER): Payer: Medicare Other | Admitting: Rehabilitative and Restorative Service Providers"

## 2021-04-07 DIAGNOSIS — R600 Localized edema: Secondary | ICD-10-CM

## 2021-04-07 DIAGNOSIS — M25511 Pain in right shoulder: Secondary | ICD-10-CM | POA: Diagnosis not present

## 2021-04-07 DIAGNOSIS — M25611 Stiffness of right shoulder, not elsewhere classified: Secondary | ICD-10-CM

## 2021-04-07 DIAGNOSIS — G8929 Other chronic pain: Secondary | ICD-10-CM | POA: Diagnosis not present

## 2021-04-07 DIAGNOSIS — R293 Abnormal posture: Secondary | ICD-10-CM | POA: Diagnosis not present

## 2021-04-07 DIAGNOSIS — M6281 Muscle weakness (generalized): Secondary | ICD-10-CM | POA: Diagnosis not present

## 2021-04-07 NOTE — Therapy (Addendum)
[facility] [address] [place], [place], [address] Phone: [phone]   Fax:  [phone]  Physical Therapy Treatment/Progress Note  Patient Details  Name: Haley Wade MRN: [id number] Date of Birth: February 04, 1946 Referring Provider (PT): Dr. Meredith Pel   Encounter Date: 04/07/2021  Progress Note Reporting Period 03/04/2021 to 04/07/2021  See note below for Objective Data and Assessment of Progress/Goals.       PT End of Session - 04/07/21 1110     Visit Number 10    Number of Visits 20    Date for PT Re-Evaluation 05/13/21    Authorization Type Medicare, [employer] - KX at 15    Progress Note Due on Visit 20    PT Start Time 1100    PT Stop Time 1141    PT Time Calculation (min) 41 min    Activity Tolerance Patient tolerated treatment well    Behavior During Therapy WFL for tasks assessed/performed             Past Medical History:  Diagnosis Date   Allergic rhinitis, cause unspecified    Anemia    Arthritis    Cellulitis    Chest discomfort    COPD, questioned    GAVE (gastric antral vascular ectasia)    Hyponatremia    MITRAL VALVE PROLAPSE, HX OF    Other chronic sinusitis    Palpitations    Syncope    Unspecified hearing loss     Past Surgical History:  Procedure Laterality Date   APPENDECTOMY     BACK SURGERY     BICEPT TENODESIS Right 02/08/2021   Procedure: BICEPS TENODESIS;  Surgeon: Meredith Pel, MD;  Location: [place];  Service: Orthopedics;  Laterality: Right;   BLADDER SURGERY     BOWEL RESECTION N/A 11/01/2012   Procedure: SMALL BOWEL RESECTION;  Surgeon: Earnstine Regal, MD;  Location: [facility];  Service: General;  Laterality: N/A;   BREAST ENHANCEMENT SURGERY     revisions   BREAST SURGERY     CHOLECYSTECTOMY     COLON SURGERY     ESOPHAGOGASTRODUODENOSCOPY (EGD) WITH PROPOFOL N/A 10/14/2019   Procedure: ESOPHAGOGASTRODUODENOSCOPY (EGD) WITH PROPOFOL;  Surgeon: Clarene Essex, MD;  Location:  [facility];  Service: Endoscopy;  Laterality: N/A;   EYE SURGERY     GI RADIOFREQUENCY ABLATION  10/14/2019   Procedure: GI RADIOFREQUENCY ABLATION;  Surgeon: Clarene Essex, MD;  Location: [facility];  Service: Endoscopy;;   LAPAROSCOPY N/A 11/01/2012   Procedure: LAPAROSCOPY DIAGNOSTIC;  Surgeon: Earnstine Regal, MD;  Location: [facility];  Service: General;  Laterality: N/A;   LAPAROTOMY N/A 11/01/2012   Procedure: EXPLORATORY LAPAROTOMY;  Surgeon: Earnstine Regal, MD;  Location: [facility];  Service: General;  Laterality: N/A;   LYSIS OF ADHESION N/A 11/01/2012   Procedure: LYSIS OF ADHESION;  Surgeon: Earnstine Regal, MD;  Location: [facility];  Service: General;  Laterality: N/A;   RHINOPLASTY     SHOULDER ARTHROSCOPY WITH OPEN ROTATOR CUFF REPAIR AND DISTAL CLAVICLE ACROMINECTOMY Right 02/08/2021   Procedure: SHOULDER ARTHROSCOPY WITH BICEPS TENDON RELEASE, MINI OPEN ROTATOR CUFF TEAR REPAIR OF THE INFRASPINATUS SUPRASPINATUS AND UPPER PORTIION OF THE SUBSCAP  WITH BICEPS TENODESIS;  Surgeon: Meredith Pel, MD;  Location: [place];  Service: Orthopedics;  Laterality: Right;   TONSILLECTOMY     TOTAL ABDOMINAL HYSTERECTOMY      There were no vitals filed for this visit.   Subjective Assessment - 04/07/21 1112  Subjective Pt. deniedpain upon arrival.  Pt. stated some anterior pain in shoulder c tightness end ranges. Global Rating of Change +7 a great deal better at this time.    Limitations House hold activities;Lifting    Patient Stated Goals Reduce pain, improve arm movement.    Currently in Pain? No/denies    Pain Score 0-No pain    Pain Onset More than a month ago                [facility] PT Assessment - 04/07/21 0001       Assessment   Medical Diagnosis Rt RTC repair    Referring Provider (PT) Dr. Meredith Pel    Onset Date/Surgical Date 08/14/20    Hand Dominance Right      Observation/Other Assessments   Focus on Therapeutic Outcomes (FOTO)  update 58%      AROM    Overall AROM Comments Rt shoulder flexion against gravity in sitting to 110 degrees    Right Shoulder Flexion 138 Degrees   in supine   Right Shoulder ABduction 118 Degrees   in supine   Right Shoulder Internal Rotation 55 Degrees   in supine   Right Shoulder External Rotation 55 Degrees   in supine     Strength   Overall Strength Comments Isometric mid range holds in flexion, abduction weak and mild pain noted on Rt shoulder    Right Shoulder Flexion 2+/5    Right Shoulder ABduction 2+/5    Right Shoulder Internal Rotation 4/5    Right Shoulder External Rotation 3+/5                           [facility] Adult PT Treatment/Exercise - 04/07/21 0001       Shoulder Exercises: Supine   Horizontal ABduction Both   2 x 10   Theraband Level (Shoulder Horizontal ABduction) Level 2 (Red)      Shoulder Exercises: Sidelying   External Rotation Right;Strengthening   c towel at side   External Rotation Limitations 2 lb x 10, 1 lb 2 x 15    Flexion Right   2 x 15   ABduction Right   2 x 15   ABduction Weight (lbs) 1      Shoulder Exercises: Standing   External Rotation Right   isometric walk out hold 5 sec x 15 red band c towel at side   Row Both;20 reps    Theraband Level (Shoulder Row) Level 2 (Red)      Shoulder Exercises: Pulleys   Flexion 3 minutes    Scaption 3 minutes    Other Pulley Exercises 5 sec hold for pulley, elbow relaxed                    PT Education - 04/07/21 1138     Education Details HEP updates    Person(s) Educated Patient    Methods Explanation;Demonstration;Verbal cues;Handout    Comprehension Verbalized understanding;Returned demonstration              PT Short Term Goals - 03/21/21 1620       PT SHORT TERM GOAL #1   Title Patient will demonstrate independent use of home exercise program to maintain progress from in clinic treatments.    Time 3    Period Weeks    Status Achieved    Target Date 03/25/21  PT Long Term Goals - 04/07/21 1125       PT LONG TERM GOAL #1   Title Patient will demonstrate/report pain at worst less than or equal to 2/10 to facilitate minimal limitation in daily activity secondary to pain symptoms.    Time 10    Period Weeks    Status On-going    Target Date 05/13/21      PT LONG TERM GOAL #2   Title Patient will demonstrate independent use of home exercise program to facilitate ability to maintain/progress functional gains from skilled physical therapy services.    Time 10    Period Weeks    Status On-going    Target Date 05/13/21      PT LONG TERM GOAL #3   Title Pt. will demonstrate FOTO outcome > or = 58% to indicated reduced disability due to condition.    Time 10    Period Weeks    Status Achieved      PT LONG TERM GOAL #4   Title Patient will demonstrate Rt [place] joint mobility with 15% of Lt to facilitate usual self care, dressing, reaching overhead at PLOF s limitation due to symptoms.    Time 10    Period Weeks    Status On-going    Target Date 05/13/21      PT LONG TERM GOAL #5   Title Patient will demonstrate Rt UE MMT > or = 4/5  throughout to facilitate usual lifting, carrying in functional activity to PLOF s limitation.    Time 10    Period Weeks    Status On-going    Target Date 05/13/21                   Plan - 04/07/21 1122     Clinical Impression Statement Pt. has attended 10 visits overall during course of treatment.  Pt. reported mild symptoms at most (mainly end range complaints) with Global Rating of Change +7 at this time. See objective data for updated information showing improvements in active mobility, strength and functional performance as noted in FOTO outcome measurement improvement.  Pt. continued to present c impairment in Rt shoulder mobility and strength which can still limit daily activity from full PLOF.  Continued skilled PT serfvices may be beneficial for continued improvements.     Examination-Activity Limitations Reach Overhead;Caring for Others;Carry;Dressing;Lift;Sleep    Examination-Participation Restrictions Cleaning;Community Activity;Driving;Interpersonal Relationship;Occupation    Stability/Clinical Decision Making Stable/Uncomplicated    Rehab Potential --   fair to good   PT Frequency 2x / week    PT Duration Other (comment)   10 weeks   PT Treatment/Interventions ADLs/Self Care Home Management;Cryotherapy;Electrical Stimulation;Therapeutic exercise;Iontophoresis '4mg'$ /ml Dexamethasone;Moist Heat;Traction;Balance training;Therapeutic activities;Functional mobility training;Stair training;Gait training;DME Instruction;Ultrasound;Neuromuscular re-education;Patient/family education;Passive range of motion;Spinal Manipulations;Joint Manipulations;Dry needling;Taping;Vasopneumatic Device;Manual techniques    PT Next Visit Plan MD follow up, continue building active mobility improvements/strength and progressing end range.    PT Home Exercise Plan [email or web address]    Consulted and Agree with Plan of Care Patient             Patient will benefit from skilled therapeutic intervention in order to improve the following deficits and impairments:  Decreased endurance, Hypomobility, Increased edema, Decreased activity tolerance, Decreased strength, Impaired UE functional use, Pain, Decreased mobility, Impaired perceived functional ability, Improper body mechanics, Postural dysfunction, Impaired flexibility, Decreased coordination, Decreased range of motion  Visit Diagnosis: Chronic right shoulder pain  Stiffness of right shoulder, not elsewhere classified  Muscle weakness (generalized)  Abnormal posture  Localized edema     Problem List Patient Active Problem List   Diagnosis Date Noted   Complete tear of right rotator cuff    Biceps tendonitis on right    S/P rotator cuff repair 02/08/2021   Chest discomfort 01/26/2014   Palpitations 01/26/2014   Cellulitis  09/08/2013   Abdominal pain, left mid abdomen, chronic 02/26/2013   Hyponatremia 10/30/2012   COPD, questioned 01/02/2012   Allergic rhinitis due to pollen 10/20/2010   MITRAL VALVE PROLAPSE, HX OF 05/26/2009   UNSPECIFIED HEARING LOSS 05/21/2009   RHINOSINUSITIS, 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    Scot Jun, PT, DPT, OCS, ATC 04/07/21  11:42 AM   [facility] [address] [place], [place], [address] Phone: [phone]   Fax:  [phone]  Name: Haley Wade MRN: [id number] Date of Birth: [birth year]

## 2021-04-07 NOTE — Patient Instructions (Signed)
Access Code: [email or web address] URL: [email or web address] Date: 04/07/2021 Prepared by: Scot Jun  Exercises Supine Shoulder External Rotation with Dowel (Mirrored) - 3 x daily - 7 x weekly - 2 sets - 10 reps - 5 hold Seated Scapular Retraction - 2 x daily - 7 x weekly - 2 sets - 10 reps - 5 hold Supine Shoulder Flexion Extension AAROM with Dowel - 2 x daily - 7 x weekly - 10 reps - 3 sets - 5 hold Sidelying Shoulder Abduction Palm Forward - 2 x daily - 7 x weekly - 3 sets - 15 reps Sidelying Shoulder External Rotation (Mirrored) - 2 x daily - 7 x weekly - 3 sets - 15 reps Standing Shoulder Row with Anchored Resistance - 2 x daily - 7 x weekly - 10 reps - 3 sets Supine Shoulder Horizontal Abduction with Resistance - 2 x daily - 7 x weekly - 10 reps - 3 sets Sidelying Shoulder Flexion 15 Degrees (Mirrored) - 2 x daily - 7 x weekly - 3 sets - 15 reps

## 2021-04-13 ENCOUNTER — Emergency Department (HOSPITAL_COMMUNITY)
Admission: EM | Admit: 2021-04-13 | Discharge: 2021-04-13 | Disposition: A | Discharge status: Home / Self Care | Payer: Medicare Other | Source: Home / Self Care | Attending: Emergency Medicine | Admitting: Emergency Medicine

## 2021-04-13 ENCOUNTER — Encounter (HOSPITAL_COMMUNITY): Payer: Self-pay | Admitting: Emergency Medicine

## 2021-04-13 ENCOUNTER — Telehealth (HOSPITAL_COMMUNITY): Payer: Self-pay

## 2021-04-13 ENCOUNTER — Emergency Department (HOSPITAL_COMMUNITY)
Admission: EM | Admit: 2021-04-13 | Discharge: 2021-04-13 | Disposition: A | Discharge status: Home / Self Care | Payer: Medicare Other | Attending: Emergency Medicine | Admitting: Emergency Medicine

## 2021-04-13 ENCOUNTER — Emergency Department (HOSPITAL_COMMUNITY): Payer: Medicare Other

## 2021-04-13 ENCOUNTER — Other Ambulatory Visit: Payer: Self-pay

## 2021-04-13 ENCOUNTER — Encounter (HOSPITAL_COMMUNITY): Payer: Self-pay

## 2021-04-13 DIAGNOSIS — W19XXXA Unspecified fall, initial encounter: Secondary | ICD-10-CM | POA: Insufficient documentation

## 2021-04-13 DIAGNOSIS — R55 Syncope and collapse: Secondary | ICD-10-CM | POA: Insufficient documentation

## 2021-04-13 DIAGNOSIS — S022XXB Fracture of nasal bones, initial encounter for open fracture: Secondary | ICD-10-CM | POA: Insufficient documentation

## 2021-04-13 DIAGNOSIS — S0992XA Unspecified injury of nose, initial encounter: Secondary | ICD-10-CM | POA: Diagnosis present

## 2021-04-13 DIAGNOSIS — Z043 Encounter for examination and observation following other accident: Secondary | ICD-10-CM | POA: Diagnosis not present

## 2021-04-13 DIAGNOSIS — W01198A Fall on same level from slipping, tripping and stumbling with subsequent striking against other object, initial encounter: Secondary | ICD-10-CM | POA: Diagnosis not present

## 2021-04-13 DIAGNOSIS — Z23 Encounter for immunization: Secondary | ICD-10-CM | POA: Insufficient documentation

## 2021-04-13 DIAGNOSIS — Z87891 Personal history of nicotine dependence: Secondary | ICD-10-CM | POA: Diagnosis not present

## 2021-04-13 DIAGNOSIS — S0181XA Laceration without foreign body of other part of head, initial encounter: Secondary | ICD-10-CM | POA: Diagnosis not present

## 2021-04-13 DIAGNOSIS — S0003XA Contusion of scalp, initial encounter: Secondary | ICD-10-CM | POA: Diagnosis not present

## 2021-04-13 DIAGNOSIS — S1091XA Abrasion of unspecified part of neck, initial encounter: Secondary | ICD-10-CM | POA: Insufficient documentation

## 2021-04-13 DIAGNOSIS — M25562 Pain in left knee: Secondary | ICD-10-CM | POA: Diagnosis not present

## 2021-04-13 DIAGNOSIS — Z7952 Long term (current) use of systemic steroids: Secondary | ICD-10-CM | POA: Insufficient documentation

## 2021-04-13 DIAGNOSIS — S022XXA Fracture of nasal bones, initial encounter for closed fracture: Secondary | ICD-10-CM | POA: Diagnosis not present

## 2021-04-13 DIAGNOSIS — Z79899 Other long term (current) drug therapy: Secondary | ICD-10-CM | POA: Insufficient documentation

## 2021-04-13 DIAGNOSIS — M25561 Pain in right knee: Secondary | ICD-10-CM | POA: Diagnosis not present

## 2021-04-13 DIAGNOSIS — J449 Chronic obstructive pulmonary disease, unspecified: Secondary | ICD-10-CM | POA: Insufficient documentation

## 2021-04-13 DIAGNOSIS — R112 Nausea with vomiting, unspecified: Secondary | ICD-10-CM | POA: Insufficient documentation

## 2021-04-13 DIAGNOSIS — S01511A Laceration without foreign body of lip, initial encounter: Secondary | ICD-10-CM | POA: Insufficient documentation

## 2021-04-13 DIAGNOSIS — R9431 Abnormal electrocardiogram [ECG] [EKG]: Secondary | ICD-10-CM | POA: Diagnosis not present

## 2021-04-13 DIAGNOSIS — S199XXA Unspecified injury of neck, initial encounter: Secondary | ICD-10-CM | POA: Diagnosis not present

## 2021-04-13 MED ORDER — ONDANSETRON HCL 4 MG/2ML IJ SOLN
4.0000 mg | Freq: Once | INTRAMUSCULAR | Status: AC
  Administered 2021-04-13: 4 mg via INTRAVENOUS
  Filled 2021-04-13: qty 2

## 2021-04-13 MED ORDER — MORPHINE SULFATE (PF) 4 MG/ML IV SOLN
4.0000 mg | Freq: Once | INTRAVENOUS | Status: AC
  Administered 2021-04-13: 4 mg via INTRAVENOUS
  Filled 2021-04-13: qty 1

## 2021-04-13 MED ORDER — CEPHALEXIN 500 MG PO CAPS: 500.0000 mg | ORAL_CAPSULE | Freq: Two times a day (BID) | ORAL | 0 refills | Status: AC

## 2021-04-13 MED ORDER — LIDOCAINE-EPINEPHRINE (PF) 2 %-1:200000 IJ SOLN
20.0000 mL | Freq: Once | INTRAMUSCULAR | Status: AC
  Administered 2021-04-13: 20 mL
  Filled 2021-04-13: qty 20

## 2021-04-13 MED ORDER — TETANUS-DIPHTH-ACELL PERTUSSIS 5-2.5-18.5 LF-MCG/0.5 IM SUSY
0.5000 mL | PREFILLED_SYRINGE | Freq: Once | INTRAMUSCULAR | Status: AC
  Administered 2021-04-13: 0.5 mL via INTRAMUSCULAR
  Filled 2021-04-13: qty 0.5

## 2021-04-13 NOTE — ED Notes (Signed)
Patient transported to CT 

## 2021-04-13 NOTE — ED Triage Notes (Signed)
Pt reports mechanical fall this morning, her toe got caught in the crack of the sidewalk and she fell face forward. Pt has laceration to forehead and to the bridge of her nose. DENIES taking any blood thinners. Pt denies LOC. Abrasions to bilateral knees. Pt a.o

## 2021-04-13 NOTE — Discharge Instructions (Addendum)
You were just medically cleared after a fall. You seem stable to me on evaluation. I believe you have an empty stomach and nearly passed out. Take it easy and follow the instructions from the discharge you had from earlier today.

## 2021-04-13 NOTE — ED Notes (Signed)
RN reviewed discharge instructions w/ pt. Follow up and prescriptions reviewed, pt had no further questions °

## 2021-04-13 NOTE — ED Provider Notes (Signed)
[facility] EMERGENCY DEPARTMENT Provider Note   CSN: [id number] Arrival date & time: 04/13/21  1426     History Chief Complaint  Patient presents with   Near Syncope    Haley Wade is a 75 y.o. female who sustained a mechanical fall earlier today. She was fully medically cleared and discharged after evaluation including CT and multiple radiographic images. She was walking out after discharge and reports she felt lightheaded, lowered slowly to ground with no additional injury. Patient endorses some nausea, small amount of emesis after this event. She reports she feels steady on her feet, but just thinks she has an empty stomach and low energy from the events of the day. She requests to be discharged.  +   Near Syncope      Past Medical History:  Diagnosis Date   Allergic rhinitis, cause unspecified    Anemia    Arthritis    Cellulitis    Chest discomfort    COPD, questioned    GAVE (gastric antral vascular ectasia)    Hyponatremia    MITRAL VALVE PROLAPSE, HX OF    Other chronic sinusitis    Palpitations    Syncope    Unspecified hearing loss     Patient Active Problem List   Diagnosis Date Noted   Complete tear of right rotator cuff    Biceps tendonitis on right    S/P rotator cuff repair 02/08/2021   Chest discomfort 01/26/2014   Palpitations 01/26/2014   Cellulitis 09/08/2013   Abdominal pain, left mid abdomen, chronic 02/26/2013   Hyponatremia 10/30/2012   COPD, questioned 01/02/2012   Allergic rhinitis due to pollen 10/20/2010   MITRAL VALVE PROLAPSE, HX OF 05/26/2009   UNSPECIFIED HEARING LOSS 05/21/2009   RHINOSINUSITIS, 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    Past Surgical History:  Procedure Laterality Date   APPENDECTOMY     BACK SURGERY     BICEPT TENODESIS Right 02/08/2021   Procedure: BICEPS TENODESIS;  Surgeon: Meredith Pel, MD;  Location: [facility];  Service: Orthopedics;  Laterality: Right;   BLADDER SURGERY     BOWEL RESECTION  N/A 11/01/2012   Procedure: SMALL BOWEL RESECTION;  Surgeon: Earnstine Regal, MD;  Location: [facility];  Service: General;  Laterality: N/A;   BREAST ENHANCEMENT SURGERY     revisions   BREAST SURGERY     CHOLECYSTECTOMY     COLON SURGERY     ESOPHAGOGASTRODUODENOSCOPY (EGD) WITH PROPOFOL N/A 10/14/2019   Procedure: ESOPHAGOGASTRODUODENOSCOPY (EGD) WITH PROPOFOL;  Surgeon: Clarene Essex, MD;  Location: [facility];  Service: Endoscopy;  Laterality: N/A;   EYE SURGERY     GI RADIOFREQUENCY ABLATION  10/14/2019   Procedure: GI RADIOFREQUENCY ABLATION;  Surgeon: Clarene Essex, MD;  Location: [facility];  Service: Endoscopy;;   LAPAROSCOPY N/A 11/01/2012   Procedure: LAPAROSCOPY DIAGNOSTIC;  Surgeon: Earnstine Regal, MD;  Location: [facility];  Service: General;  Laterality: N/A;   LAPAROTOMY N/A 11/01/2012   Procedure: EXPLORATORY LAPAROTOMY;  Surgeon: Earnstine Regal, MD;  Location: [facility];  Service: General;  Laterality: N/A;   LYSIS OF ADHESION N/A 11/01/2012   Procedure: LYSIS OF ADHESION;  Surgeon: Earnstine Regal, MD;  Location: [facility];  Service: General;  Laterality: N/A;   RHINOPLASTY     SHOULDER ARTHROSCOPY WITH OPEN ROTATOR CUFF REPAIR AND DISTAL CLAVICLE ACROMINECTOMY Right 02/08/2021   Procedure: SHOULDER ARTHROSCOPY WITH BICEPS TENDON RELEASE, MINI OPEN ROTATOR CUFF TEAR REPAIR OF THE INFRASPINATUS SUPRASPINATUS AND UPPER PORTIION  OF THE SUBSCAP  WITH BICEPS TENODESIS;  Surgeon: Meredith Pel, MD;  Location: [facility];  Service: Orthopedics;  Laterality: Right;   TONSILLECTOMY     TOTAL ABDOMINAL HYSTERECTOMY       OB History   No obstetric history on file.     Family History  Problem Relation Age of Onset   Other Mother        c difficile gastroenteritis   Lung cancer Father        smoker   Liver disease Brother    Liver cancer Brother    Asthma Other        grandmother   Other Other        bronchitis-grandmother    Social History   Tobacco Use   Smoking status: Former     Types: Cigarettes    Quit date: 08/14/1980    Years since quitting: 40.6   Smokeless tobacco: Never  Substance Use Topics   Alcohol use: No   Drug use: No    Home Medications Prior to Admission medications   Medication Sig Start Date End Date Taking? Authorizing Provider  acyclovir (ZOVIRAX) 200 MG capsule Take 200 mg by mouth daily.    [provider]  cephALEXin (KEFLEX) 500 MG capsule Take 1 capsule (500 mg total) by mouth 2 (two) times daily for 7 days. 04/13/21 04/20/21  Henderly, Britni A, PA-C  cyclobenzaprine (FLEXERIL) 5 MG tablet Take 2 tablets (10 mg total) by mouth 3 (three) times daily as needed for muscle spasms. 03/17/21   Meredith Pel, MD  estrogens, conjugated, (PREMARIN) 0.625 MG tablet Take 0.625 mg by mouth daily.     [provider]  FERREX 150 150 MG capsule Take 150 mg by mouth daily with breakfast.    [provider]  finasteride (PROSCAR) 5 MG tablet Take 5 mg by mouth daily.    [provider]  fluticasone (FLONASE) 50 MCG/ACT nasal spray Place 2 sprays into both nostrils daily as needed for allergies or rhinitis.    [provider]  HYDROcodone-acetaminophen (NORCO/VICODIN) 5-325 MG tablet Take 1 tablet by mouth every 6 (six) hours as needed for moderate pain. 02/12/21   Meredith Pel, MD  hydrocodone-ibuprofen (VICOPROFEN) 5-200 MG tablet 1 po q 4-6hrs prn pain Patient taking differently: Take 1 tablet by mouth See admin instructions. Take 1 tablet by mouth every 4-6 hours as needed for pain 01/14/21   Meredith Pel, MD  ibuprofen (ADVIL) 800 MG tablet Take 200-800 mg by mouth every 8 (eight) hours as needed (for pain). 12/17/20   [provider]  LORazepam (ATIVAN) 0.5 MG tablet Take 0.5 tablets (0.25 mg total) by mouth in the morning and at bedtime. Patient taking differently: Take 0.25 mg by mouth 2 (two) times daily as needed for anxiety. 10/18/20   Cottle, Billey Co., MD  Methen-Bella-Meth Bl-Phen Sal  (URISED PO) Take 1 tablet by mouth daily as needed (for bladder spasms).    [provider]  methocarbamol (ROBAXIN) 500 MG tablet Take 1 tablet (500 mg total) by mouth every 8 (eight) hours as needed for muscle spasms. Patient not taking: Reported on 04/13/2021 01/14/21   Meredith Pel, MD  metoprolol succinate (TOPROL XL) 25 MG 24 hr tablet Take 0.5 tablets (12.5 mg total) by mouth daily. 11/01/20   Belva Crome, MD  metroNIDAZOLE (METROGEL) 0.75 % vaginal gel Place 1 Applicatorful vaginally as needed (AS DIRECTED- for yeast).    [provider]  omeprazole (PRILOSEC) 20 MG capsule Take 20 mg by mouth daily as needed (for reflux).    [provider]  Polyethyl Glycol-Propyl Glycol (SYSTANE ULTRA) 0.4-0.3 % SOLN Place 1 drop into both eyes 3 (three) times daily.    [provider]  spironolactone (ALDACTONE) 50 MG tablet Take 1 tablet (50 mg total) by mouth daily. Patient taking differently: Take 50 mg by mouth 3 (three) times daily. 09/09/13   Shon Baton, MD  triamcinolone cream (KENALOG) 0.1 % Apply 1 application topically 2 (two) times daily as needed (for itching).    [provider]  Vitamin D, Ergocalciferol, (DRISDOL) 1.25 MG (50000 UNIT) CAPS capsule Take 50,000 Units by mouth every Saturday.    [provider]  zaleplon (SONATA) 10 MG capsule Take 10 mg by mouth at bedtime as needed for sleep.    [provider]    Allergies    Plaquenil [hydroxychloroquine sulfate], Bee venom, and Terconazole  Review of Systems   Review of Systems  Cardiovascular:  Positive for near-syncope.  All other systems reviewed and are negative.  Physical Exam Updated Vital Signs BP 126/81 (BP Location: Right Arm)   Pulse 79   Temp 98.6 F (37 C) (Oral)   SpO2 100%   Physical Exam Vitals and nursing note reviewed.  Constitutional:      General: She is not in acute distress.    Appearance: Normal appearance.  HENT:     Head:  Normocephalic and atraumatic.  Eyes:     General:        Right eye: No discharge.        Left eye: No discharge.  Cardiovascular:     Rate and Rhythm: Normal rate and regular rhythm.  Pulmonary:     Effort: Pulmonary effort is normal. No respiratory distress.  Musculoskeletal:        General: No deformity.  Skin:    General: Skin is warm and dry.     Comments: Extensive lacerations and bruising as described in previous note  Neurological:     Mental Status: She is alert and oriented to person, place, and time.     Motor: No weakness.     Coordination: Coordination normal.     Gait: Gait normal.     Comments: Balance intact. Romberg negative. Patient able to walk without difficulty.   Psychiatric:        Mood and Affect: Mood normal.        Behavior: Behavior normal.    ED Results / Procedures / Treatments   Labs (all labs ordered are listed, but only abnormal results are displayed) Labs Reviewed - No data to display  EKG None  Radiology DG Chest 1 View  Result Date: 04/13/2021 CLINICAL DATA:  Fall EXAM: CHEST  1 VIEW COMPARISON:  Chest radiograph 01/26/2014 FINDINGS: Unchanged cardiomediastinal silhouette. There is no new focal airspace disease. There is no large pleural effusion or visible pneumothorax. No acute osseous abnormality. There is some mineralization along the left shoulder. Thoracic spondylosis. IMPRESSION: No evidence of acute cardiopulmonary disease. Electronically Signed   By: Maurine Simmering M.D.   On: 04/13/2021 10:46   DG Shoulder Right  Result Date: 04/13/2021 CLINICAL DATA:  Fall EXAM: RIGHT SHOULDER - 2+ VIEW COMPARISON:  None. FINDINGS: There is no acute fracture or dislocation. The humeral head is somewhat high-riding in the glenoid, though there is no evidence of traumatic dislocation the acromioclavicular joint is maintained. The soft tissues are unremarkable.  IMPRESSION: No acute fracture or dislocation. Electronically Signed   By: Valetta Mole M.D.    On: 04/13/2021 10:41   CT HEAD WO CONTRAST (5MM)  Result Date: 04/13/2021 CLINICAL DATA:  Trauma, hit the front of her face EXAM: CT HEAD WITHOUT CONTRAST CT MAXILLOFACIAL WITHOUT CONTRAST TECHNIQUE: Multidetector CT imaging of the head and maxillofacial structures were performed using the standard protocol without intravenous contrast. Multiplanar CT image reconstructions of the maxillofacial structures were also generated. COMPARISON:  Brain MRI 10/08/2009, CT/CTA head 05/05/2011 FINDINGS: CT HEAD FINDINGS Brain: There is no evidence of acute intracranial hemorrhage, extra-axial fluid collection, or infarct. The ventricles are nonenlarged. There is no mass lesion. There is no midline shift. Vascular: No hyperdense vessel or unexpected calcification. Skull: Normal. Negative for fracture or focal lesion. Other: There is a small left frontal scalp hematoma. CT MAXILLOFACIAL FINDINGS Osseous: There are minimally displaced fractures of the bilateral nasal bones. There is a small amount of debris in the underlying nasal cavity. No other fracture is seen. Orbits: The globes and orbits are unremarkable. Sinuses: There is mild mucosal thickening in a right ethmoid air cell. The paranasal sinuses are otherwise clear. Soft tissues: There is a small left frontal scalp hematoma. IMPRESSION: 1. No acute intracranial hemorrhage or calvarial fracture. 2. Minimally displaced bilateral nasal bone fractures. 3. Small left frontal scalp hematoma. Electronically Signed   By: Valetta Mole M.D.   On: 04/13/2021 12:12   CT Cervical Spine Wo Contrast  Result Date: 04/13/2021 CLINICAL DATA:  Trauma EXAM: CT CERVICAL SPINE WITHOUT CONTRAST TECHNIQUE: Multidetector CT imaging of the cervical spine was performed without intravenous contrast. Multiplanar CT image reconstructions were also generated. COMPARISON:  None. FINDINGS: Alignment: There is trace anterolisthesis of C2 on C3 and grade 1 retrolisthesis C4 on C5, likely  degenerative in nature. There is no evidence of traumatic malalignment. There is no jumped or perched facet. Skull base and vertebrae: Skull base alignment is maintained. Vertebral body heights are preserved. There is no evidence of acute fracture. Soft tissues and spinal canal: No prevertebral fluid or swelling. No visible canal hematoma. Disc levels: There is multilevel disc space narrowing, most advanced at C4-C5. There is multilevel uncovertebral and facet arthropathy. The osseous spinal canal and neural foramina are patent. Upper chest: The lung apices are clear. Other: None. IMPRESSION: 1. No acute fracture or traumatic malalignment of the cervical spine. 2. Trace anterolisthesis of C2 on C3 and grade 1 retrolisthesis of C4 on C5, likely degenerative in nature. 3. Multilevel degenerative changes, most advanced at C4-C5. Electronically Signed   By: Valetta Mole M.D.   On: 04/13/2021 12:19   DG Knee Complete 4 Views Left  Result Date: 04/13/2021 CLINICAL DATA:  Fall EXAM: LEFT KNEE - COMPLETE 4+ VIEW COMPARISON:  None. FINDINGS: There is no evidence of acute fracture or dislocation. There is tricompartment degenerative change with mild joint space narrowing. There is chondrocalcinosis. No significant joint effusion. IMPRESSION: No evidence of acute fracture or significant joint effusion. Mild tricompartment osteoarthritis with chondrocalcinosis, which is likely related to degenerative arthritis present also be seen in CPPD arthropathy. Electronically Signed   By: Maurine Simmering M.D.   On: 04/13/2021 10:43   DG Knee Complete 4 Views Right  Result Date: 04/13/2021 CLINICAL DATA:  Fall EXAM: RIGHT KNEE - COMPLETE 4+ VIEW COMPARISON:  None. FINDINGS: There is no evidence of acute fracture. There is tricompartment degenerative change with mild medial joint space narrowing and chondrocalcinosis. No significant joint effusion. IMPRESSION:  No evidence of acute fracture or significant joint effusion. Mild  tricompartment osteoarthritis with chondrocalcinosis, which is likely related to degenerative arthritis, but can also be seen in CPPD arthropathy. Electronically Signed   By: Maurine Simmering M.D.   On: 04/13/2021 10:42   CT Maxillofacial Wo Contrast  Result Date: 04/13/2021 CLINICAL DATA:  Trauma, hit the front of her face EXAM: CT HEAD WITHOUT CONTRAST CT MAXILLOFACIAL WITHOUT CONTRAST TECHNIQUE: Multidetector CT imaging of the head and maxillofacial structures were performed using the standard protocol without intravenous contrast. Multiplanar CT image reconstructions of the maxillofacial structures were also generated. COMPARISON:  Brain MRI 10/08/2009, CT/CTA head 05/05/2011 FINDINGS: CT HEAD FINDINGS Brain: There is no evidence of acute intracranial hemorrhage, extra-axial fluid collection, or infarct. The ventricles are nonenlarged. There is no mass lesion. There is no midline shift. Vascular: No hyperdense vessel or unexpected calcification. Skull: Normal. Negative for fracture or focal lesion. Other: There is a small left frontal scalp hematoma. CT MAXILLOFACIAL FINDINGS Osseous: There are minimally displaced fractures of the bilateral nasal bones. There is a small amount of debris in the underlying nasal cavity. No other fracture is seen. Orbits: The globes and orbits are unremarkable. Sinuses: There is mild mucosal thickening in a right ethmoid air cell. The paranasal sinuses are otherwise clear. Soft tissues: There is a small left frontal scalp hematoma. IMPRESSION: 1. No acute intracranial hemorrhage or calvarial fracture. 2. Minimally displaced bilateral nasal bone fractures. 3. Small left frontal scalp hematoma. Electronically Signed   By: Valetta Mole M.D.   On: 04/13/2021 12:12    Procedures Procedures   Medications Ordered in ED Medications - No data to display  ED Course  I have reviewed the triage vital signs and the nursing notes.  Pertinent labs & imaging results that were available  during my care of the patient were reviewed by me and considered in my medical decision making (see chart for details).    MDM Rules/Calculators/A&P                         Injuries medically cleared from earlier today.  Patient was given some food and a drink and we watched her walk and balance for several minutes with no return of near syncope symptoms. Heart rate and breathing regular. Nausea resolved. Patient request discharge. She has help at home and a ride home. Judged to be safe for discharge. Final Clinical Impression(s) / ED Diagnoses Final diagnoses:  Near syncope    Rx / DC Orders ED Discharge Orders     None        Dorien Chihuahua 04/13/21 1515    Daleen Bo, MD 04/14/21 1018

## 2021-04-13 NOTE — ED Triage Notes (Signed)
Pt states she was just seen this morning for a mechanical trip and fall. Pt received stitches in her face. Pt has multiple abrasions on face and nose. Bruising to bilateral knees. Pt checked back in after near syncopal episode while waiting for her husband to pick her up outside. Pt states she still feels like she could pass out.

## 2021-04-13 NOTE — ED Notes (Signed)
Pt was sitting on bench and stood up because pt thought her ride was here. After walking towards the car, pt fell and onto knees. Pt was then caught by staff and placed into a wheelchair. Pt then checked back in to be seen.

## 2021-04-13 NOTE — ED Provider Notes (Addendum)
[facility] EMERGENCY DEPARTMENT Provider Note   CSN: [id number] Arrival date & time: 04/13/21  0900    History Chief Complaint  Patient presents with   Horton Marshall Sydnee Levans is a 75 y.o. female with no significant past medical history who presents for evaluation of mechanical fall just PTA.  Apparently tripped and fell landing on her face.  Multiple lacerations to her face.  Endorses bilateral knee pain with overlying abrasions as well as midline neck pain.  Patient was not placed in c-collar by EMS however was placed via nursing on arrival due to midline neck pain.  She denies any presyncopal or syncopal symptoms.  Did felt a little "woozy" after the fall.  She denies any anticoagulation use.  No paresthesias or weakness.  She has pain to her right shoulder which she had surgical repair approximately 8 to 9 weeks ago.  Unknown last tetanus.  Denies any loose dentition.  No headache, paresthesias, weakness, chest pain, shortness of breath, abdominal pain.  Denies additional aggravating or alleviating factors. Ambulatory PTA.  History obtained from patient and past medical records.  No interpreter is used.  HPI     Past Medical History:  Diagnosis Date   Allergic rhinitis, cause unspecified    Anemia    Arthritis    Cellulitis    Chest discomfort    COPD, questioned    GAVE (gastric antral vascular ectasia)    Hyponatremia    MITRAL VALVE PROLAPSE, HX OF    Other chronic sinusitis    Palpitations    Syncope    Unspecified hearing loss     Patient Active Problem List   Diagnosis Date Noted   Complete tear of right rotator cuff    Biceps tendonitis on right    S/P rotator cuff repair 02/08/2021   Chest discomfort 01/26/2014   Palpitations 01/26/2014   Cellulitis 09/08/2013   Abdominal pain, left mid abdomen, chronic 02/26/2013   Hyponatremia 10/30/2012   COPD, questioned 01/02/2012   Allergic rhinitis due to pollen 10/20/2010   MITRAL VALVE  PROLAPSE, HX OF 05/26/2009   UNSPECIFIED HEARING LOSS 05/21/2009   RHINOSINUSITIS, 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    Past Surgical History:  Procedure Laterality Date   APPENDECTOMY     BACK SURGERY     BICEPT TENODESIS Right 02/08/2021   Procedure: BICEPS TENODESIS;  Surgeon: Meredith Pel, MD;  Location: [place];  Service: Orthopedics;  Laterality: Right;   BLADDER SURGERY     BOWEL RESECTION N/A 11/01/2012   Procedure: SMALL BOWEL RESECTION;  Surgeon: Earnstine Regal, MD;  Location: [facility];  Service: General;  Laterality: N/A;   BREAST ENHANCEMENT SURGERY     revisions   BREAST SURGERY     CHOLECYSTECTOMY     COLON SURGERY     ESOPHAGOGASTRODUODENOSCOPY (EGD) WITH PROPOFOL N/A 10/14/2019   Procedure: ESOPHAGOGASTRODUODENOSCOPY (EGD) WITH PROPOFOL;  Surgeon: Clarene Essex, MD;  Location: [facility];  Service: Endoscopy;  Laterality: N/A;   EYE SURGERY     GI RADIOFREQUENCY ABLATION  10/14/2019   Procedure: GI RADIOFREQUENCY ABLATION;  Surgeon: Clarene Essex, MD;  Location: [facility];  Service: Endoscopy;;   LAPAROSCOPY N/A 11/01/2012   Procedure: LAPAROSCOPY DIAGNOSTIC;  Surgeon: Earnstine Regal, MD;  Location: [facility];  Service: General;  Laterality: N/A;   LAPAROTOMY N/A 11/01/2012   Procedure: EXPLORATORY LAPAROTOMY;  Surgeon: Earnstine Regal, MD;  Location: [facility];  Service: General;  Laterality: N/A;   LYSIS OF  ADHESION N/A 11/01/2012   Procedure: LYSIS OF ADHESION;  Surgeon: Earnstine Regal, MD;  Location: [facility];  Service: General;  Laterality: N/A;   RHINOPLASTY     SHOULDER ARTHROSCOPY WITH OPEN ROTATOR CUFF REPAIR AND DISTAL CLAVICLE ACROMINECTOMY Right 02/08/2021   Procedure: SHOULDER ARTHROSCOPY WITH BICEPS TENDON RELEASE, MINI OPEN ROTATOR CUFF TEAR REPAIR OF THE INFRASPINATUS SUPRASPINATUS AND UPPER PORTIION OF THE SUBSCAP  WITH BICEPS TENODESIS;  Surgeon: Meredith Pel, MD;  Location: [place];  Service: Orthopedics;  Laterality: Right;   TONSILLECTOMY     TOTAL ABDOMINAL  HYSTERECTOMY       OB History   No obstetric history on file.     Family History  Problem Relation Age of Onset   Other Mother        c difficile gastroenteritis   Lung cancer Father        smoker   Liver disease Brother    Liver cancer Brother    Asthma Other        grandmother   Other Other        bronchitis-grandmother    Social History   Tobacco Use   Smoking status: Former    Types: Cigarettes    Quit date: 08/14/1980    Years since quitting: 40.6   Smokeless tobacco: Never  Substance Use Topics   Alcohol use: No   Drug use: No    Home Medications Prior to Admission medications   Medication Sig Start Date End Date Taking? Authorizing Provider  acyclovir (ZOVIRAX) 200 MG capsule Take 200 mg by mouth daily.   Yes [provider]  cephALEXin (KEFLEX) 500 MG capsule Take 1 capsule (500 mg total) by mouth 2 (two) times daily for 7 days. 04/13/21 04/20/21 Yes Ashaun Gaughan A, PA-C  cyclobenzaprine (FLEXERIL) 5 MG tablet Take 2 tablets (10 mg total) by mouth 3 (three) times daily as needed for muscle spasms. 03/17/21  Yes Meredith Pel, MD  estrogens, conjugated, (PREMARIN) 0.625 MG tablet Take 0.625 mg by mouth daily.    Yes [provider]  FERREX 150 150 MG capsule Take 150 mg by mouth daily with breakfast.   Yes [provider]  finasteride (PROSCAR) 5 MG tablet Take 5 mg by mouth daily.   Yes [provider]  fluticasone (FLONASE) 50 MCG/ACT nasal spray Place 2 sprays into both nostrils daily as needed for allergies or rhinitis.   Yes [provider]  HYDROcodone-acetaminophen (NORCO/VICODIN) 5-325 MG tablet Take 1 tablet by mouth every 6 (six) hours as needed for moderate pain. 02/12/21  Yes Dean, Tonna Corner, MD  hydrocodone-ibuprofen (VICOPROFEN) 5-200 MG tablet 1 po q 4-6hrs prn pain Patient taking differently: Take 1 tablet by mouth See admin instructions. Take 1 tablet by mouth every 4-6 hours as needed for pain  01/14/21  Yes Dean, Tonna Corner, MD  ibuprofen (ADVIL) 800 MG tablet Take 200-800 mg by mouth every 8 (eight) hours as needed (for pain). 12/17/20  Yes [provider]  LORazepam (ATIVAN) 0.5 MG tablet Take 0.5 tablets (0.25 mg total) by mouth in the morning and at bedtime. Patient taking differently: Take 0.25 mg by mouth 2 (two) times daily as needed for anxiety. 10/18/20  Yes Cottle, Billey Co., MD  Methen-Bella-Meth Bl-Phen Sal (URISED PO) Take 1 tablet by mouth daily as needed (for bladder spasms).   Yes [provider]  metoprolol succinate (TOPROL XL) 25 MG 24 hr tablet Take 0.5 tablets (12.5 mg total)  by mouth daily. 11/01/20  Yes Belva Crome, MD  metroNIDAZOLE (METROGEL) 0.75 % vaginal gel Place 1 Applicatorful vaginally as needed (AS DIRECTED- for yeast).   Yes [provider]  omeprazole (PRILOSEC) 20 MG capsule Take 20 mg by mouth daily as needed (for reflux).   Yes [provider]  Polyethyl Glycol-Propyl Glycol (SYSTANE ULTRA) 0.4-0.3 % SOLN Place 1 drop into both eyes 3 (three) times daily.   Yes [provider]  spironolactone (ALDACTONE) 50 MG tablet Take 1 tablet (50 mg total) by mouth daily. Patient taking differently: Take 50 mg by mouth 3 (three) times daily. 09/09/13  Yes Shon Baton, MD  triamcinolone cream (KENALOG) 0.1 % Apply 1 application topically 2 (two) times daily as needed (for itching).   Yes [provider]  Vitamin D, Ergocalciferol, (DRISDOL) 1.25 MG (50000 UNIT) CAPS capsule Take 50,000 Units by mouth every Saturday.   Yes [provider]  zaleplon (SONATA) 10 MG capsule Take 10 mg by mouth at bedtime as needed for sleep.   Yes [provider]  methocarbamol (ROBAXIN) 500 MG tablet Take 1 tablet (500 mg total) by mouth every 8 (eight) hours as needed for muscle spasms. Patient not taking: Reported on 04/13/2021 01/14/21   Meredith Pel, MD    Allergies    Plaquenil [hydroxychloroquine  sulfate], Bee venom, and Terconazole  Review of Systems   Review of Systems  Constitutional: Negative.   HENT: Negative.    Respiratory: Negative.    Cardiovascular: Negative.   Gastrointestinal: Negative.   Genitourinary: Negative.   Musculoskeletal: Negative.   Skin:  Positive for wound.  Neurological: Negative.   All other systems reviewed and are negative.  Physical Exam Updated Vital Signs BP 140/72 (BP Location: Left Arm)   Pulse 77   Temp 98.4 F (36.9 C) (Oral)   Resp 19   Ht 4' 11.5" (1.511 m)   Wt 43.5 kg   SpO2 99%   BMI 19.07 kg/m   Physical Exam Vitals and nursing note reviewed. Exam conducted with a chaperone present.  Constitutional:      General: She is not in acute distress.    Appearance: She is well-developed. She is not ill-appearing, toxic-appearing or diaphoretic.  HENT:     Head: Normocephalic. Abrasion, contusion and laceration present. No raccoon eyes, Battle's sign, right periorbital erythema or left periorbital erythema.     Jaw: There is normal jaw occlusion. No trismus, tenderness, swelling, pain on movement or malocclusion.      Comments: 5 cm jagged laceration to left forehead with 1 cm skin tear to forehead, 2 cm laceration to bridge of nose, [facility] 8 mm left to inner mucosa upper lip. 1 cm rounded skin tear to upper left lip Tenderness to nasal bridge    Nose: Signs of injury and nasal tenderness present. No laceration, mucosal edema or rhinorrhea.     Right Nostril: No foreign body, epistaxis, septal hematoma or occlusion.     Left Nostril: Epistaxis present. No foreign body, septal hematoma or occlusion.     Comments: Dried blood to left nares no septal hematoma, persistent bleed    Mouth/Throat:     Lips: Pink.     Mouth: Mucous membranes are moist.     Dentition: Normal dentition. Does not have dentures. No dental tenderness.     Pharynx: Oropharynx is clear. Uvula midline.     Comments: Dentition  intact Eyes:  Extraocular Movements: Extraocular movements intact.     Conjunctiva/sclera: Conjunctivae normal.     Pupils: Pupils are equal, round, and reactive to light.     Comments: PERRLA  Neck:     Trachea: Trachea and phonation normal.      Comments: Tenderness to midline cervical region, placed in c-collar by nursing Cardiovascular:     Rate and Rhythm: Normal rate.     Pulses: Normal pulses.          Radial pulses are 2+ on the right side and 2+ on the left side.       Dorsalis pedis pulses are 2+ on the right side and 2+ on the left side.     Heart sounds: Normal heart sounds.  Pulmonary:     Effort: Pulmonary effort is normal. No respiratory distress.     Breath sounds: Normal breath sounds and air entry.  Chest:     Comments: Non tender, no crepitus or step offs Abdominal:     General: Bowel sounds are normal. There is no distension.     Palpations: Abdomen is soft.     Tenderness: There is no abdominal tenderness. There is no guarding or rebound.  Musculoskeletal:        General: Normal range of motion.     Comments: Non tender pelvis, No shortening or rotation of legs. Tender diffuse to anterior BL knees with overlying abrasion. Able to flex and extend without difficulty. Non tender BL femur, tib/fib. Diffuse tenderness to right shoulder however full ROM. Full ROM to left UE  Skin:    General: Skin is warm and dry.     Capillary Refill: Capillary refill takes less than 2 seconds.     Findings: Laceration present.     Comments: Multiple lacerations and abrasions.  Neurological:     General: No focal deficit present.     Mental Status: She is alert.     Comments: CN 2-12 grossly intact Equal strength Intact sensation  Psychiatric:        Mood and Affect: Mood normal.   ED Results / Procedures / Treatments   Labs (all labs ordered are listed, but only abnormal results are displayed) Labs Reviewed - No data to display  EKG None  Radiology DG Chest 1  View  Result Date: 04/13/2021 CLINICAL DATA:  Fall EXAM: CHEST  1 VIEW COMPARISON:  Chest radiograph 01/26/2014 FINDINGS: Unchanged cardiomediastinal silhouette. There is no new focal airspace disease. There is no large pleural effusion or visible pneumothorax. No acute osseous abnormality. There is some mineralization along the left shoulder. Thoracic spondylosis. IMPRESSION: No evidence of acute cardiopulmonary disease. Electronically Signed   By: Maurine Simmering M.D.   On: 04/13/2021 10:46   DG Shoulder Right  Result Date: 04/13/2021 CLINICAL DATA:  Fall EXAM: RIGHT SHOULDER - 2+ VIEW COMPARISON:  None. FINDINGS: There is no acute fracture or dislocation. The humeral head is somewhat high-riding in the glenoid, though there is no evidence of traumatic dislocation the acromioclavicular joint is maintained. The soft tissues are unremarkable. IMPRESSION: No acute fracture or dislocation. Electronically Signed   By: Valetta Mole M.D.   On: 04/13/2021 10:41   CT HEAD WO CONTRAST (5MM)  Result Date: 04/13/2021 CLINICAL DATA:  Trauma, hit the front of her face EXAM: CT HEAD WITHOUT CONTRAST CT MAXILLOFACIAL WITHOUT CONTRAST TECHNIQUE: Multidetector CT imaging of the head and maxillofacial structures were performed using the standard protocol without intravenous contrast. Multiplanar CT image reconstructions of the maxillofacial  structures were also generated. COMPARISON:  Brain MRI 10/08/2009, CT/CTA head 05/05/2011 FINDINGS: CT HEAD FINDINGS Brain: There is no evidence of acute intracranial hemorrhage, extra-axial fluid collection, or infarct. The ventricles are nonenlarged. There is no mass lesion. There is no midline shift. Vascular: No hyperdense vessel or unexpected calcification. Skull: Normal. Negative for fracture or focal lesion. Other: There is a small left frontal scalp hematoma. CT MAXILLOFACIAL FINDINGS Osseous: There are minimally displaced fractures of the bilateral nasal bones. There is a small  amount of debris in the underlying nasal cavity. No other fracture is seen. Orbits: The globes and orbits are unremarkable. Sinuses: There is mild mucosal thickening in a right ethmoid air cell. The paranasal sinuses are otherwise clear. Soft tissues: There is a small left frontal scalp hematoma. IMPRESSION: 1. No acute intracranial hemorrhage or calvarial fracture. 2. Minimally displaced bilateral nasal bone fractures. 3. Small left frontal scalp hematoma. Electronically Signed   By: Valetta Mole M.D.   On: 04/13/2021 12:12   CT Cervical Spine Wo Contrast  Result Date: 04/13/2021 CLINICAL DATA:  Trauma EXAM: CT CERVICAL SPINE WITHOUT CONTRAST TECHNIQUE: Multidetector CT imaging of the cervical spine was performed without intravenous contrast. Multiplanar CT image reconstructions were also generated. COMPARISON:  None. FINDINGS: Alignment: There is trace anterolisthesis of C2 on C3 and grade 1 retrolisthesis C4 on C5, likely degenerative in nature. There is no evidence of traumatic malalignment. There is no jumped or perched facet. Skull base and vertebrae: Skull base alignment is maintained. Vertebral body heights are preserved. There is no evidence of acute fracture. Soft tissues and spinal canal: No prevertebral fluid or swelling. No visible canal hematoma. Disc levels: There is multilevel disc space narrowing, most advanced at C4-C5. There is multilevel uncovertebral and facet arthropathy. The osseous spinal canal and neural foramina are patent. Upper chest: The lung apices are clear. Other: None. IMPRESSION: 1. No acute fracture or traumatic malalignment of the cervical spine. 2. Trace anterolisthesis of C2 on C3 and grade 1 retrolisthesis of C4 on C5, likely degenerative in nature. 3. Multilevel degenerative changes, most advanced at C4-C5. Electronically Signed   By: Valetta Mole M.D.   On: 04/13/2021 12:19   DG Knee Complete 4 Views Left  Result Date: 04/13/2021 CLINICAL DATA:  Fall EXAM: LEFT KNEE -  COMPLETE 4+ VIEW COMPARISON:  None. FINDINGS: There is no evidence of acute fracture or dislocation. There is tricompartment degenerative change with mild joint space narrowing. There is chondrocalcinosis. No significant joint effusion. IMPRESSION: No evidence of acute fracture or significant joint effusion. Mild tricompartment osteoarthritis with chondrocalcinosis, which is likely related to degenerative arthritis present also be seen in CPPD arthropathy. Electronically Signed   By: Maurine Simmering M.D.   On: 04/13/2021 10:43   DG Knee Complete 4 Views Right  Result Date: 04/13/2021 CLINICAL DATA:  Fall EXAM: RIGHT KNEE - COMPLETE 4+ VIEW COMPARISON:  None. FINDINGS: There is no evidence of acute fracture. There is tricompartment degenerative change with mild medial joint space narrowing and chondrocalcinosis. No significant joint effusion. IMPRESSION: No evidence of acute fracture or significant joint effusion. Mild tricompartment osteoarthritis with chondrocalcinosis, which is likely related to degenerative arthritis, but can also be seen in CPPD arthropathy. Electronically Signed   By: Maurine Simmering M.D.   On: 04/13/2021 10:42   CT Maxillofacial Wo Contrast  Result Date: 04/13/2021 CLINICAL DATA:  Trauma, hit the front of her face EXAM: CT HEAD WITHOUT CONTRAST CT MAXILLOFACIAL WITHOUT CONTRAST TECHNIQUE: Multidetector CT imaging  of the head and maxillofacial structures were performed using the standard protocol without intravenous contrast. Multiplanar CT image reconstructions of the maxillofacial structures were also generated. COMPARISON:  Brain MRI 10/08/2009, CT/CTA head 05/05/2011 FINDINGS: CT HEAD FINDINGS Brain: There is no evidence of acute intracranial hemorrhage, extra-axial fluid collection, or infarct. The ventricles are nonenlarged. There is no mass lesion. There is no midline shift. Vascular: No hyperdense vessel or unexpected calcification. Skull: Normal. Negative for fracture or focal lesion.  Other: There is a small left frontal scalp hematoma. CT MAXILLOFACIAL FINDINGS Osseous: There are minimally displaced fractures of the bilateral nasal bones. There is a small amount of debris in the underlying nasal cavity. No other fracture is seen. Orbits: The globes and orbits are unremarkable. Sinuses: There is mild mucosal thickening in a right ethmoid air cell. The paranasal sinuses are otherwise clear. Soft tissues: There is a small left frontal scalp hematoma. IMPRESSION: 1. No acute intracranial hemorrhage or calvarial fracture. 2. Minimally displaced bilateral nasal bone fractures. 3. Small left frontal scalp hematoma. Electronically Signed   By: Valetta Mole M.D.   On: 04/13/2021 12:12    Procedures .Marland KitchenLaceration Repair  Date/Time: 04/13/2021 1:49 PM Performed by: Shelby Dubin A, PA-C Authorized by: Nettie Elm, PA-C   Consent:    Consent obtained:  Verbal   Consent given by:  Patient   Risks discussed:  Infection, need for additional repair, pain, poor cosmetic result and poor wound healing   Alternatives discussed:  No treatment and delayed treatment Universal protocol:    Procedure explained and questions answered to patient or proxy's satisfaction: yes     Relevant documents present and verified: yes     Test results available: yes     Imaging studies available: yes     Required blood products, implants, devices, and special equipment available: yes     Site/side marked: yes     Immediately prior to procedure, a time out was called: yes     Patient identity confirmed:  Verbally with patient Anesthesia:    Anesthesia method:  Local infiltration   Local anesthetic:  Lidocaine 1% WITH epi Laceration details:    Location:  Face   Face location:  Forehead   Length (cm):  5   Depth (mm):  4 Pre-procedure details:    Preparation:  Patient was prepped and draped in usual sterile fashion and imaging obtained to evaluate for foreign bodies Exploration:    Hemostasis  achieved with:  Direct pressure   Imaging obtained comment:  CT   Wound exploration: wound explored through full range of motion and entire depth of wound visualized     Wound extent: no fascia violation noted, no foreign bodies/material noted, no muscle damage noted, no tendon damage noted, no underlying fracture noted and no vascular damage noted     Contaminated: no   Treatment:    Area cleansed with:  Povidone-iodine   Amount of cleaning:  Extensive   Irrigation solution:  Sterile saline Skin repair:    Repair method:  Sutures   Suture size:  5-0   Suture material:  Prolene   Suture technique:  Simple interrupted   Number of sutures:  7 Approximation:    Approximation:  Close Repair type:    Repair type:  Intermediate Post-procedure details:    Dressing:  Open (no dressing)   Procedure completion:  Tolerated well, no immediate complications .Marland KitchenLaceration Repair  Date/Time: 04/13/2021 1:50 PM Performed by: Nettie Elm, PA-C Authorized by:  Dekota Kirlin A, PA-C   Consent:    Consent obtained:  Verbal   Consent given by:  Patient   Risks discussed:  Infection, need for additional repair, pain, poor cosmetic result and poor wound healing   Alternatives discussed:  No treatment and delayed treatment Universal protocol:    Procedure explained and questions answered to patient or proxy's satisfaction: yes     Relevant documents present and verified: yes     Test results available: yes     Imaging studies available: yes     Required blood products, implants, devices, and special equipment available: yes     Site/side marked: yes     Immediately prior to procedure, a time out was called: yes     Patient identity confirmed:  Verbally with patient Anesthesia:    Anesthesia method:  Local infiltration   Local anesthetic:  Lidocaine 1% WITH epi Laceration details:    Location:  Lip   Lip location:  Upper exterior lip   Length (cm):  4 Pre-procedure details:     Preparation:  Patient was prepped and draped in usual sterile fashion and imaging obtained to evaluate for foreign bodies Exploration:    Hemostasis achieved with:  Direct pressure   Wound extent: no foreign bodies/material noted, no muscle damage noted, no nerve damage noted, no tendon damage noted, no underlying fracture noted and no vascular damage noted   Treatment:    Area cleansed with:  Povidone-iodine   Amount of cleaning:  Extensive   Irrigation solution:  Sterile saline Skin repair:    Repair method:  Sutures   Suture size:  5-0   Suture material:  Prolene   Number of sutures:  1 Repair type:    Repair type:  Intermediate Post-procedure details:    Dressing:  Open (no dressing)   Procedure completion:  Tolerated well, no immediate complications .Marland KitchenLaceration Repair  Date/Time: 04/13/2021 1:51 PM Performed by: Shelby Dubin A, PA-C Authorized by: Nettie Elm, PA-C   Consent:    Consent obtained:  Verbal   Consent given by:  Patient   Risks discussed:  Infection, need for additional repair, pain, poor cosmetic result and poor wound healing   Alternatives discussed:  No treatment and delayed treatment Universal protocol:    Procedure explained and questions answered to patient or proxy's satisfaction: yes     Relevant documents present and verified: yes     Test results available: yes     Imaging studies available: yes     Required blood products, implants, devices, and special equipment available: yes     Site/side marked: yes     Immediately prior to procedure, a time out was called: yes     Patient identity confirmed:  Verbally with patient Anesthesia:    Anesthesia method:  Local infiltration   Local anesthetic:  Lidocaine 1% WITH epi Laceration details:    Location:  Face   Face location:  Nose   Length (cm):  1.5   Depth (mm):  4 Pre-procedure details:    Preparation:  Patient was prepped and draped in usual sterile fashion and imaging obtained to  evaluate for foreign bodies Exploration:    Hemostasis achieved with:  Direct pressure   Imaging obtained comment:  CT   Wound extent: underlying fracture     Wound extent: no foreign bodies/material noted, no muscle damage noted, no tendon damage noted and no vascular damage noted   Treatment:    Area cleansed with:  Povidone-iodine   Amount of cleaning:  Extensive   Irrigation solution:  Sterile saline   Irrigation method:  Pressure wash Skin repair:    Repair method:  Sutures   Suture size:  5-0   Suture material:  Prolene   Suture technique:  Simple interrupted   Number of sutures:  3 Approximation:    Approximation:  Close Repair type:    Repair type:  Intermediate Post-procedure details:    Dressing:  Open (no dressing)   Procedure completion:  Tolerated well, no immediate complications   Medications Ordered in ED Medications  lidocaine-EPINEPHrine (XYLOCAINE W/EPI) 2 %-1:200000 (PF) injection 20 mL (20 mLs Infiltration Given 04/13/21 1236)  Tdap (BOOSTRIX) injection 0.5 mL (0.5 mLs Intramuscular Given 04/13/21 0935)  ondansetron (ZOFRAN) injection 4 mg (4 mg Intravenous Given 04/13/21 0935)  morphine 4 MG/ML injection 4 mg (4 mg Intravenous Given 04/13/21 0933)  morphine 4 MG/ML injection 4 mg (4 mg Intravenous Given 04/13/21 1236)   ED Course  I have reviewed the triage vital signs and the nursing notes.  Pertinent labs & imaging results that were available during my care of the patient were reviewed by me and considered in my medical decision making (see chart for details).  Here for evaluation of mechanical fall PTA.  Has multiple lacerations to her face.  Dentition appears intact.  She has a nonfocal neuro exam without deficits.  Heart and lungs clear but abdomen soft, nontender.  She is no shortening rotation of legs and nontender pelvis.  Does have some tenderness of bilateral anterior knees however with full range of motion.  Recent right shoulder surgery which patient  is concerned about.  We will plan on imaging, pain management, suturing of lacerations and reassess.  CT head without acute abnormality CT cervical with degenerative changes. No traumatic injuries CT max face with nasal fracture DG right knee without fracture, dislocation. Does show arthritis  DG left knee without fracture, dislocation. Does show arthritis DG right shoulder without acute fracture, dislocation or AC separation DG chest without acute abnormality  Patient reassessed. Wounds thoroughly irrigated.  See procedure note.  Wound sutured aside from her inner mucosal laceration to her upper lip.  Discussed swish with water after eating or drinking. Wound is not gapping and small so should heal without closure  Also placed on antibiotics due to known nasal bone fracture with overlying laceration.  Will refer outpatient ENT/plastics.  The patient has been appropriately medically screened and/or stabilized in the ED. I have low suspicion for any other emergent medical condition which would require further screening, evaluation or treatment in the ED or require inpatient management.  Patient is hemodynamically stable and in no acute distress.  Patient able to ambulate in department prior to ED.  Evaluation does not show acute pathology that would require ongoing or additional emergent interventions while in the emergency department or further inpatient treatment.  I have discussed the diagnosis with the patient and answered all questions.  Pain is been managed while in the emergency department and patient has no further complaints prior to discharge.  Patient is comfortable with plan discussed in room and is stable for discharge at this time.  I have discussed strict return precautions for returning to the emergency department.  Patient was encouraged to follow-up with PCP/specialist refer to at discharge.     MDM Rules/Calculators/A&P  Final Clinical Impression(s) /  ED Diagnoses Final diagnoses:  Fall, initial encounter  Facial laceration, initial encounter  Open fracture of nasal bone, initial encounter    Rx / DC Orders ED Discharge Orders          Ordered    cephALEXin (KEFLEX) 500 MG capsule  2 times daily        04/13/21 1359             Jerolyn Flenniken A, PA-C 04/13/21 1401    Macklyn Glandon A, PA-C 04/13/21 1405    Curatolo, Adam, DO 04/13/21 1529

## 2021-04-13 NOTE — Telephone Encounter (Signed)
PT to UC lobby with neighbor.  Reports trip and fall shortly before arrival.  Denies LOC, denies blood thinners.  Multiple lacerations noted to face as well as abrasions to L elbow and bilateral knees.  Merrie Roof PA to triage to assess.  Refer pt to ED for eval beyond scope of UC.  Pt verbalizes understanding.  Upon ambulation to vehicle, pt report dizziness and nausea.  Pt placed in wheelchair for transport to ED with staff.  Agricultural consultant at [facility] ED notified.

## 2021-04-13 NOTE — Discharge Instructions (Addendum)
Your CT scan did show a fracture of your nasal bone.  There was an overlying cut which we had to place 3 stitches at.  It is important that you do not blow your nose.  You need to have the sutures removed in approximately 5 days.  I have prescribed you antibiotics.  He may take Tylenol and ibuprofen as needed for your pain.  You also had 7 sutures placed to your forehead and one to your lip.  Make sure to let warm soapy water run over these.

## 2021-04-14 ENCOUNTER — Encounter: Payer: Medicare Other | Admitting: Orthopedic Surgery

## 2021-04-14 ENCOUNTER — Telehealth: Payer: Self-pay | Admitting: Orthopedic Surgery

## 2021-04-14 NOTE — Telephone Encounter (Signed)
Pt called requesting a call back for new post op appt. There are no openings for dr. Marlou Sa or PA Lurena Joiner at this time. Pt didn't make it to appt due to fall and was in ER all day. Please call pt with open slot for post op. Pt phone number is [phone].

## 2021-04-15 NOTE — Telephone Encounter (Signed)
Appt scheduled

## 2021-04-19 DIAGNOSIS — S0081XA Abrasion of other part of head, initial encounter: Secondary | ICD-10-CM | POA: Diagnosis not present

## 2021-04-19 DIAGNOSIS — W010XXA Fall on same level from slipping, tripping and stumbling without subsequent striking against object, initial encounter: Secondary | ICD-10-CM | POA: Diagnosis not present

## 2021-04-19 DIAGNOSIS — Z4802 Encounter for removal of sutures: Secondary | ICD-10-CM | POA: Diagnosis not present

## 2021-04-19 DIAGNOSIS — R55 Syncope and collapse: Secondary | ICD-10-CM | POA: Diagnosis not present

## 2021-04-19 DIAGNOSIS — S0101XA Laceration without foreign body of scalp, initial encounter: Secondary | ICD-10-CM | POA: Diagnosis not present

## 2021-04-19 DIAGNOSIS — S01511A Laceration without foreign body of lip, initial encounter: Secondary | ICD-10-CM | POA: Diagnosis not present

## 2021-04-19 DIAGNOSIS — S022XXB Fracture of nasal bones, initial encounter for open fracture: Secondary | ICD-10-CM | POA: Diagnosis not present

## 2021-04-19 DIAGNOSIS — S0121XA Laceration without foreign body of nose, initial encounter: Secondary | ICD-10-CM | POA: Diagnosis not present

## 2021-04-20 ENCOUNTER — Encounter: Payer: Medicare Other | Admitting: Rehabilitative and Restorative Service Providers"

## 2021-04-20 DIAGNOSIS — H6121 Impacted cerumen, right ear: Secondary | ICD-10-CM | POA: Diagnosis not present

## 2021-04-20 DIAGNOSIS — S022XXA Fracture of nasal bones, initial encounter for closed fracture: Secondary | ICD-10-CM | POA: Diagnosis not present

## 2021-04-20 DIAGNOSIS — H903 Sensorineural hearing loss, bilateral: Secondary | ICD-10-CM | POA: Diagnosis not present

## 2021-04-20 DIAGNOSIS — S0181XD Laceration without foreign body of other part of head, subsequent encounter: Secondary | ICD-10-CM | POA: Diagnosis not present

## 2021-04-20 DIAGNOSIS — W1789XA Other fall from one level to another, initial encounter: Secondary | ICD-10-CM | POA: Diagnosis not present

## 2021-04-20 DIAGNOSIS — Z974 Presence of external hearing-aid: Secondary | ICD-10-CM | POA: Diagnosis not present

## 2021-04-21 ENCOUNTER — Other Ambulatory Visit: Payer: Self-pay

## 2021-04-21 ENCOUNTER — Encounter: Payer: Self-pay | Admitting: Psychiatry

## 2021-04-21 ENCOUNTER — Ambulatory Visit (INDEPENDENT_AMBULATORY_CARE_PROVIDER_SITE_OTHER): Payer: Medicare Other | Admitting: Psychiatry

## 2021-04-21 DIAGNOSIS — F411 Generalized anxiety disorder: Secondary | ICD-10-CM

## 2021-04-21 DIAGNOSIS — F5105 Insomnia due to other mental disorder: Secondary | ICD-10-CM | POA: Diagnosis not present

## 2021-04-21 MED ORDER — ZALEPLON 10 MG PO CAPS: 10.0000 mg | ORAL_CAPSULE | Freq: Every evening | ORAL | 2 refills | Status: DC | PRN

## 2021-04-21 NOTE — Progress Notes (Signed)
Haley Wade [id number] 1945/09/28 75 y.o.  Subjective:   Patient ID:  Haley Wade is a 75 y.o. (DOB June 10, 1946) female.  Chief Complaint:  Chief Complaint  Patient presents with   Follow-up   Anxiety   Sleeping Problem    HPI Haley Wade presents to the office today for follow-up of anxiety and insomnia.  Challenging winter with health issues with GI problems.  Heart monitor and cardiac workup with palpitations.  Fall last week after starting morning walk and tripped.  Problems with EMA and doesn't want to get used to taking zaleplon and about 3-4 times per week.  Has used imagery and other techniques to help sleep. 1 and 1/2 cup coffee am only. No hangover with zaleplon.  Weaned off lorazepam HS and then only prn.  Last used 02/08/21. No panic lately. And anxiety managed. Patient reports stable mood and denies depressed or irritable moods.  Patient denies any recent difficulty with anxiety.  Denies appetite disturbance.  Patient reports that energy and motivation have been good.  Patient denies any difficulty with concentration.  Patient denies any suicidal ideation.  Past Psychiatric Medication Trials: Past Psychiatric History:  Long history of counseling Remote antidepressants without help and Seroquel NR & SE. On lorazepam for years. Alprazolam short duration.  Klonopin SE.  [place] [facility] from 04/13/2021 in [facility] (Discharged) from 02/08/2021 in [facility] from 02/02/2021 in [facility] PREADMISSION TESTING  C-SSRS RISK CATEGORY No Risk No Risk No Risk        Review of Systems:  Review of Systems  Cardiovascular:  Positive for palpitations.  Neurological:  Negative for tremors and weakness.   Medications: I have reviewed the patient's current medications.  Current Outpatient Medications  Medication Sig Dispense Refill    acyclovir (ZOVIRAX) 200 MG capsule Take 200 mg by mouth daily.     cyclobenzaprine (FLEXERIL) 5 MG tablet Take 2 tablets (10 mg total) by mouth 3 (three) times daily as needed for muscle spasms. 30 tablet 0   estrogens, conjugated, (PREMARIN) 0.625 MG tablet Take 0.625 mg by mouth daily.      FERREX 150 150 MG capsule Take 150 mg by mouth daily with breakfast.     finasteride (PROSCAR) 5 MG tablet Take 5 mg by mouth daily.     fluticasone (FLONASE) 50 MCG/ACT nasal spray Place 2 sprays into both nostrils daily as needed for allergies or rhinitis.     ibuprofen (ADVIL) 800 MG tablet Take 200-800 mg by mouth every 8 (eight) hours as needed (for pain).     LORazepam (ATIVAN) 0.5 MG tablet Take 0.5 tablets (0.25 mg total) by mouth in the morning and at bedtime. (Patient taking differently: Take 0.25 mg by mouth 2 (two) times daily as needed for anxiety.) 30 tablet 5   Methen-Bella-Meth Bl-Phen Sal (URISED PO) Take 1 tablet by mouth daily as needed (for bladder spasms).     metoprolol succinate (TOPROL XL) 25 MG 24 hr tablet Take 0.5 tablets (12.5 mg total) by mouth daily. 45 tablet 3   metroNIDAZOLE (METROGEL) 0.75 % vaginal gel Place 1 Applicatorful vaginally as needed (AS DIRECTED- for yeast).     omeprazole (PRILOSEC) 20 MG capsule Take 20 mg by mouth daily as needed (for reflux).     Polyethyl Glycol-Propyl Glycol (SYSTANE ULTRA) 0.4-0.3 % SOLN Place 1 drop into both eyes 3 (three) times daily.  spironolactone (ALDACTONE) 50 MG tablet Take 1 tablet (50 mg total) by mouth daily. (Patient taking differently: Take 50 mg by mouth 3 (three) times daily.)     Vitamin D, Ergocalciferol, (DRISDOL) 1.25 MG (50000 UNIT) CAPS capsule Take 50,000 Units by mouth every Saturday.     zaleplon (SONATA) 10 MG capsule Take 10 mg by mouth at bedtime as needed for sleep.     HYDROcodone-acetaminophen (NORCO/VICODIN) 5-325 MG tablet Take 1 tablet by mouth every 6 (six) hours as needed for moderate pain. (Patient  not taking: Reported on 04/21/2021) 30 tablet 0   hydrocodone-ibuprofen (VICOPROFEN) 5-200 MG tablet 1 po q 4-6hrs prn pain (Patient not taking: Reported on 04/21/2021) 35 tablet 0   methocarbamol (ROBAXIN) 500 MG tablet Take 1 tablet (500 mg total) by mouth every 8 (eight) hours as needed for muscle spasms. (Patient not taking: No sig reported) 30 tablet 0   triamcinolone cream (KENALOG) 0.1 % Apply 1 application topically 2 (two) times daily as needed (for itching). (Patient not taking: Reported on 04/21/2021)     No current facility-administered medications for this visit.    Medication Side Effects: None  Allergies:  Allergies  Allergen Reactions   Plaquenil [Hydroxychloroquine Sulfate] Rash and Other (See Comments)    "Covered all over in a rash and welts"   Bee Venom Swelling and Other (See Comments)    Swelling at site where stung   Terconazole Nausea Only and Other (See Comments)    Headaches, also    Past Medical History:  Diagnosis Date   Allergic rhinitis, cause unspecified    Anemia    Arthritis    Cellulitis    Chest discomfort    COPD, questioned    GAVE (gastric antral vascular ectasia)    Hyponatremia    MITRAL VALVE PROLAPSE, HX OF    Other chronic sinusitis    Palpitations    Syncope    Unspecified hearing loss     Past Medical History, Surgical history, Social history, and Family history were reviewed and updated as appropriate.   Please see review of systems for further details on the patient's review from today.   Objective:   Physical Exam:  There were no vitals taken for this visit.  Physical Exam Constitutional:      General: She is not in acute distress. Musculoskeletal:        General: No deformity.  Neurological:     Mental Status: She is alert and oriented to person, place, and time.     Coordination: Coordination normal.  Psychiatric:        Attention and Perception: Attention and perception normal. She does not perceive auditory or  visual hallucinations.        Mood and Affect: Mood is anxious. Mood is not depressed. Affect is not labile, blunt, angry or inappropriate.        Speech: Speech normal. Speech is not slurred.        Behavior: Behavior normal.        Thought Content: Thought content normal. Thought content is not paranoid or delusional. Thought content does not include homicidal or suicidal ideation. Thought content does not include homicidal or suicidal plan.        Cognition and Memory: Cognition and memory normal.        Judgment: Judgment normal.     Comments: Insight intact    Lab Review:     Component Value Date/Time   NA 133 (L) 02/02/2021 1107  K 4.6 02/02/2021 1107   CL 101 02/02/2021 1107   CO2 26 02/02/2021 1107   GLUCOSE 88 02/02/2021 1107   BUN 22 02/02/2021 1107   CREATININE 0.55 02/02/2021 1107   CALCIUM 9.5 02/02/2021 1107   PROT 6.3 10/30/2012 0650   ALBUMIN 3.0 (L) 10/30/2012 0650   AST 13 10/30/2012 0650   ALT 19 10/30/2012 0650   ALKPHOS 46 10/30/2012 0650   BILITOT 0.3 10/30/2012 0650   GFRNONAA >60 02/02/2021 1107   GFRAA >90 09/09/2013 0617       Component Value Date/Time   WBC 8.3 02/02/2021 1107   RBC 4.63 02/02/2021 1107   HGB 13.1 02/02/2021 1107   HCT 41.2 02/02/2021 1107   PLT 270 02/02/2021 1107   MCV 89.0 02/02/2021 1107   MCH 28.3 02/02/2021 1107   MCHC 31.8 02/02/2021 1107   RDW 13.1 02/02/2021 1107   LYMPHSABS 2.1 09/08/2013 1300   MONOABS 0.6 09/08/2013 1300   EOSABS 0.0 09/08/2013 1300   BASOSABS 0.0 09/08/2013 1300    No results found for: POCLITH, LITHIUM   No results found for: PHENYTOIN, PHENOBARB, VALPROATE, CBMZ   .res Assessment: Plan:    Haley Wade was seen today for follow-up, anxiety and sleeping problem.  Diagnoses and all orders for this visit:  Generalized anxiety disorder  Insomnia due to mental condition   Been able to wean off lorazepam and anxiety controlled.  Still sleep issues half the time.  Disc risk of zaleplon  and risk falls etc.  We discussed the short-term risks associated with benzodiazepines including sedation and increased fall risk among others.  Discussed long-term side effect risk including dependence, potential withdrawal symptoms, and the potential eventual dose-related risk of dementia.  But recent studies from 2020 dispute this association between benzodiazepines and dementia risk. Newer studies in 2020 do not support an association with dementia.  Disc this in great detail bc she had questions, as well as alternatives like trazodone or melatonin.  Also disc CBT for sleep. She wants to defer trazodone and use zaleplon rarely.  Rare lorazepam.  FU 6 mos or less if not using meds.  Lynder Parents, MD, DFAPA   Please see After Visit Summary for patient specific instructions.  Future Appointments  Date Time Provider [place]  04/22/2021 10:15 AM Girtha Rm, PT [facility] None  04/25/2021  2:00 PM Meredith Pel, MD [facility] None  04/26/2021 11:45 AM Scot Jun B, PT [facility] None  04/28/2021 11:45 AM Girtha Rm, PT [facility] None  05/03/2021 11:45 AM Girtha Rm, PT [facility] None  05/05/2021 11:45 AM Girtha Rm, PT [facility] None    No orders of the defined types were placed in this encounter.   -------------------------------

## 2021-04-22 ENCOUNTER — Encounter: Payer: Self-pay | Admitting: Rehabilitative and Restorative Service Providers"

## 2021-04-22 ENCOUNTER — Ambulatory Visit (INDEPENDENT_AMBULATORY_CARE_PROVIDER_SITE_OTHER): Payer: Medicare Other | Admitting: Rehabilitative and Restorative Service Providers"

## 2021-04-22 DIAGNOSIS — G8929 Other chronic pain: Secondary | ICD-10-CM | POA: Diagnosis not present

## 2021-04-22 DIAGNOSIS — R600 Localized edema: Secondary | ICD-10-CM | POA: Diagnosis not present

## 2021-04-22 DIAGNOSIS — M6281 Muscle weakness (generalized): Secondary | ICD-10-CM

## 2021-04-22 DIAGNOSIS — M25511 Pain in right shoulder: Secondary | ICD-10-CM

## 2021-04-22 DIAGNOSIS — R293 Abnormal posture: Secondary | ICD-10-CM | POA: Diagnosis not present

## 2021-04-22 DIAGNOSIS — M25611 Stiffness of right shoulder, not elsewhere classified: Secondary | ICD-10-CM

## 2021-04-22 NOTE — Therapy (Addendum)
[facility] Physical Therapy [address] [place], [place], [address] Phone: [phone]   Fax:  [phone]  Physical Therapy Treatment  Patient Details  Name: Haley Wade MRN: [id number] Date of Birth: September 01, 1945 Referring Provider (PT): Dr. Meredith Pel   Encounter Date: 04/22/2021   PT End of Session - 04/22/21 1014     Visit Number 11    Number of Visits 20    Date for PT Re-Evaluation 05/13/21    Authorization Type Medicare, AARP - KX at 15    Progress Note Due on Visit 108    PT Start Time 1012    PT Stop Time 1046    PT Time Calculation (min) 34 min    Activity Tolerance Patient tolerated treatment well    Behavior During Therapy WFL for tasks assessed/performed             Past Medical History:  Diagnosis Date   Allergic rhinitis, cause unspecified    Anemia    Arthritis    Cellulitis    Chest discomfort    COPD, questioned    GAVE (gastric antral vascular ectasia)    Hyponatremia    MITRAL VALVE PROLAPSE, HX OF    Other chronic sinusitis    Palpitations    Syncope    Unspecified hearing loss     Past Surgical History:  Procedure Laterality Date   APPENDECTOMY     BACK SURGERY     BICEPT TENODESIS Right 02/08/2021   Procedure: BICEPS TENODESIS;  Surgeon: Meredith Pel, MD;  Location: [place];  Service: Orthopedics;  Laterality: Right;   BLADDER SURGERY     BOWEL RESECTION N/A 11/01/2012   Procedure: SMALL BOWEL RESECTION;  Surgeon: Earnstine Regal, MD;  Location: [facility];  Service: General;  Laterality: N/A;   BREAST ENHANCEMENT SURGERY     revisions   BREAST SURGERY     CHOLECYSTECTOMY     COLON SURGERY     ESOPHAGOGASTRODUODENOSCOPY (EGD) WITH PROPOFOL N/A 10/14/2019   Procedure: ESOPHAGOGASTRODUODENOSCOPY (EGD) WITH PROPOFOL;  Surgeon: Clarene Essex, MD;  Location: [facility];  Service: Endoscopy;  Laterality: N/A;   EYE SURGERY     GI RADIOFREQUENCY ABLATION  10/14/2019   Procedure: GI RADIOFREQUENCY  ABLATION;  Surgeon: Clarene Essex, MD;  Location: [facility];  Service: Endoscopy;;   LAPAROSCOPY N/A 11/01/2012   Procedure: LAPAROSCOPY DIAGNOSTIC;  Surgeon: Earnstine Regal, MD;  Location: [facility];  Service: General;  Laterality: N/A;   LAPAROTOMY N/A 11/01/2012   Procedure: EXPLORATORY LAPAROTOMY;  Surgeon: Earnstine Regal, MD;  Location: [facility];  Service: General;  Laterality: N/A;   LYSIS OF ADHESION N/A 11/01/2012   Procedure: LYSIS OF ADHESION;  Surgeon: Earnstine Regal, MD;  Location: [facility];  Service: General;  Laterality: N/A;   RHINOPLASTY     SHOULDER ARTHROSCOPY WITH OPEN ROTATOR CUFF REPAIR AND DISTAL CLAVICLE ACROMINECTOMY Right 02/08/2021   Procedure: SHOULDER ARTHROSCOPY WITH BICEPS TENDON RELEASE, MINI OPEN ROTATOR CUFF TEAR REPAIR OF THE INFRASPINATUS SUPRASPINATUS AND UPPER PORTIION OF THE SUBSCAP  WITH BICEPS TENODESIS;  Surgeon: Meredith Pel, MD;  Location: [facility];  Service: Orthopedics;  Laterality: Right;   TONSILLECTOMY     TOTAL ABDOMINAL HYSTERECTOMY      There were no vitals filed for this visit.   Subjective Assessment - 04/22/21 1010     Subjective Pt. indicated a fall with injury and time in ER last week. Pt. stated xrays were performed on Rt shoulder  and unremarkable per her report.   Had difficulty with arm and hard to do movement.    Limitations House hold activities;Lifting    Patient Stated Goals Reduce pain, improve arm movement.    Pain Score 5     Pain Location Shoulder    Pain Orientation Right    Pain Descriptors / Indicators Aching;Discomfort    Pain Type Chronic pain    Pain Onset More than a month ago    Pain Frequency Intermittent    Aggravating Factors  increased symptoms after fall    Pain Relieving Factors resting                [facility] PT Assessment - 04/22/21 0001       Assessment   Medical Diagnosis Rt RTC repair    Referring Provider (PT) Dr. Meredith Pel    Onset Date/Surgical Date 08/14/20    Hand Dominance Right       AROM   Overall AROM Comments all measured in supine, ER/IR in 45 deg abduction    Right Shoulder Flexion 140 Degrees    Right Shoulder ABduction 125 Degrees    Right Shoulder Internal Rotation 50 Degrees    Right Shoulder External Rotation 60 Degrees                           [facility] Adult PT Treatment/Exercise - 04/22/21 0001       Shoulder Exercises: Supine   Other Supine Exercises supine 2 lb bar flexion x 15      Shoulder Exercises: Sidelying   External Rotation Right   2 x 15   External Rotation Weight (lbs) 1    Flexion Right   2 x 15   Flexion Weight (lbs) 1    ABduction Right   2 x 15   ABduction Weight (lbs) 1      Shoulder Exercises: Pulleys   Flexion 3 minutes    Scaption 3 minutes    Other Pulley Exercises 5 sec hold for pulley, elbow relaxed                       PT Short Term Goals - 03/21/21 1620       PT SHORT TERM GOAL #1   Title Patient will demonstrate independent use of home exercise program to maintain progress from in clinic treatments.    Time 3    Period Weeks    Status Achieved    Target Date 03/25/21               PT Long Term Goals - 04/07/21 1125       PT LONG TERM GOAL #1   Title Patient will demonstrate/report pain at worst less than or equal to 2/10 to facilitate minimal limitation in daily activity secondary to pain symptoms.    Time 10    Period Weeks    Status On-going    Target Date 05/13/21      PT LONG TERM GOAL #2   Title Patient will demonstrate independent use of home exercise program to facilitate ability to maintain/progress functional gains from skilled physical therapy services.    Time 10    Period Weeks    Status On-going    Target Date 05/13/21      PT LONG TERM GOAL #3   Title Pt. will demonstrate FOTO outcome > or = 58% to indicated reduced disability due to condition.  Time 10    Period Weeks    Status Achieved      PT LONG TERM GOAL #4   Title Patient will  demonstrate Rt [place] joint mobility with 15% of Lt to facilitate usual self care, dressing, reaching overhead at PLOF s limitation due to symptoms.    Time 10    Period Weeks    Status On-going    Target Date 05/13/21      PT LONG TERM GOAL #5   Title Patient will demonstrate Rt UE MMT > or = 4/5  throughout to facilitate usual lifting, carrying in functional activity to PLOF s limitation.    Time 10    Period Weeks    Status On-going    Target Date 05/13/21                   Plan - 04/22/21 1026     Clinical Impression Statement Reassessment of movement today was comparable to last visit despite fall reported.  Pain was higher in Rt arm in general but mobility assessment passsively was unremarkable.  Able to perform active movement c reduced resistance today to facilitate return to exercise routine from prior to fall.  Following up with MD prior to next visit.    Examination-Activity Limitations Reach Overhead;Caring for Others;Carry;Dressing;Lift;Sleep    Examination-Participation Restrictions Cleaning;Community Activity;Driving;Interpersonal Relationship;Occupation    Stability/Clinical Decision Making Stable/Uncomplicated    Rehab Potential --   fair to good   PT Frequency 2x / week    PT Duration Other (comment)   10 weeks   PT Treatment/Interventions ADLs/Self Care Home Management;Cryotherapy;Electrical Stimulation;Therapeutic exercise;Iontophoresis '4mg'$ /ml Dexamethasone;Moist Heat;Traction;Balance training;Therapeutic activities;Functional mobility training;Stair training;Gait training;DME Instruction;Ultrasound;Neuromuscular re-education;Patient/family education;Passive range of motion;Spinal Manipulations;Joint Manipulations;Dry needling;Taping;Vasopneumatic Device;Manual techniques    PT Next Visit Plan Continue building active mobility improvements/strength and progressing end range.    PT Home Exercise Plan [id number]    Consulted and Agree with Plan of Care Patient              Patient will benefit from skilled therapeutic intervention in order to improve the following deficits and impairments:  Decreased endurance, Hypomobility, Increased edema, Decreased activity tolerance, Decreased strength, Impaired UE functional use, Pain, Decreased mobility, Impaired perceived functional ability, Improper body mechanics, Postural dysfunction, Impaired flexibility, Decreased coordination, Decreased range of motion  Visit Diagnosis: Chronic right shoulder pain  Stiffness of right shoulder, not elsewhere classified  Muscle weakness (generalized)  Abnormal posture  Localized edema     Problem List Patient Active Problem List   Diagnosis Date Noted   Complete tear of right rotator cuff    Biceps tendonitis on right    S/P rotator cuff repair 02/08/2021   Chest discomfort 01/26/2014   Palpitations 01/26/2014   Cellulitis 09/08/2013   Abdominal pain, left mid abdomen, chronic 02/26/2013   Hyponatremia 10/30/2012   COPD, questioned 01/02/2012   Allergic rhinitis due to pollen 10/20/2010   MITRAL VALVE PROLAPSE, HX OF 05/26/2009   UNSPECIFIED HEARING LOSS 05/21/2009   RHINOSINUSITIS, 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 [place], [place], [address] Phone: [phone]   Fax:  [phone]  Name: SEVAEH MAYNES MRN: [id number] Date of Birth: June 15, 1946

## 2021-04-25 ENCOUNTER — Other Ambulatory Visit: Payer: Self-pay

## 2021-04-25 ENCOUNTER — Encounter: Payer: Self-pay | Admitting: Orthopedic Surgery

## 2021-04-25 ENCOUNTER — Ambulatory Visit (INDEPENDENT_AMBULATORY_CARE_PROVIDER_SITE_OTHER): Payer: Medicare Other | Admitting: Orthopedic Surgery

## 2021-04-25 DIAGNOSIS — R269 Unspecified abnormalities of gait and mobility: Secondary | ICD-10-CM

## 2021-04-26 ENCOUNTER — Ambulatory Visit (INDEPENDENT_AMBULATORY_CARE_PROVIDER_SITE_OTHER): Payer: Medicare Other | Admitting: Rehabilitative and Restorative Service Providers"

## 2021-04-26 ENCOUNTER — Encounter: Payer: Self-pay | Admitting: Rehabilitative and Restorative Service Providers"

## 2021-04-26 DIAGNOSIS — M25611 Stiffness of right shoulder, not elsewhere classified: Secondary | ICD-10-CM | POA: Diagnosis not present

## 2021-04-26 DIAGNOSIS — G8929 Other chronic pain: Secondary | ICD-10-CM | POA: Diagnosis not present

## 2021-04-26 DIAGNOSIS — R600 Localized edema: Secondary | ICD-10-CM | POA: Diagnosis not present

## 2021-04-26 DIAGNOSIS — M25511 Pain in right shoulder: Secondary | ICD-10-CM | POA: Diagnosis not present

## 2021-04-26 DIAGNOSIS — M6281 Muscle weakness (generalized): Secondary | ICD-10-CM | POA: Diagnosis not present

## 2021-04-26 DIAGNOSIS — R293 Abnormal posture: Secondary | ICD-10-CM | POA: Diagnosis not present

## 2021-04-26 NOTE — Therapy (Signed)
[facility] [address] [place], [place], [address] Phone: [phone]   Fax:  [phone]  Physical Therapy Treatment  Patient Details  Name: Haley Wade MRN: [id number] Date of Birth: 07/13/1946 Referring Provider (PT): Dr. Meredith Pel   Encounter Date: 04/26/2021   PT End of Session - 04/26/21 1147     Visit Number 12    Number of Visits 20    Date for PT Re-Evaluation 05/13/21    Authorization Type Medicare, [employer] - KX at 15    Progress Note Due on Visit 21    PT Start Time 1139    PT Stop Time 1218    PT Time Calculation (min) 39 min    Activity Tolerance Patient tolerated treatment well    Behavior During Therapy [facility] for tasks assessed/performed             Past Medical History:  Diagnosis Date   Allergic rhinitis, cause unspecified    Anemia    Arthritis    Cellulitis    Chest discomfort    COPD, questioned    GAVE (gastric antral vascular ectasia)    Hyponatremia    MITRAL VALVE PROLAPSE, HX OF    Other chronic sinusitis    Palpitations    Syncope    Unspecified hearing loss     Past Surgical History:  Procedure Laterality Date   APPENDECTOMY     BACK SURGERY     BICEPT TENODESIS Right 02/08/2021   Procedure: BICEPS TENODESIS;  Surgeon: Meredith Pel, MD;  Location: [place];  Service: Orthopedics;  Laterality: Right;   BLADDER SURGERY     BOWEL RESECTION N/A 11/01/2012   Procedure: SMALL BOWEL RESECTION;  Surgeon: Earnstine Regal, MD;  [facility];  Service: General;  Laterality: N/A;   BREAST ENHANCEMENT SURGERY     revisions   BREAST SURGERY     CHOLECYSTECTOMY     COLON SURGERY     ESOPHAGOGASTRODUODENOSCOPY (EGD) WITH PROPOFOL N/A 10/14/2019   Procedure: ESOPHAGOGASTRODUODENOSCOPY (EGD) WITH PROPOFOL;  Surgeon: Clarene Essex, MD;  [facility];  Service: Endoscopy;  Laterality: N/A;   EYE SURGERY     GI RADIOFREQUENCY ABLATION  10/14/2019   Procedure: GI RADIOFREQUENCY  ABLATION;  Surgeon: Clarene Essex, MD;  [facility];  Service: Endoscopy;;   LAPAROSCOPY N/A 11/01/2012   Procedure: LAPAROSCOPY DIAGNOSTIC;  Surgeon: Earnstine Regal, MD;  [facility];  Service: General;  Laterality: N/A;   LAPAROTOMY N/A 11/01/2012   Procedure: EXPLORATORY LAPAROTOMY;  Surgeon: Earnstine Regal, MD;  [facility];  Service: General;  Laterality: N/A;   LYSIS OF ADHESION N/A 11/01/2012   Procedure: LYSIS OF ADHESION;  Surgeon: Earnstine Regal, MD;  [facility];  Service: General;  Laterality: N/A;   RHINOPLASTY     SHOULDER ARTHROSCOPY WITH OPEN ROTATOR CUFF REPAIR AND DISTAL CLAVICLE ACROMINECTOMY Right 02/08/2021   Procedure: SHOULDER ARTHROSCOPY WITH BICEPS TENDON RELEASE, MINI OPEN ROTATOR CUFF TEAR REPAIR OF THE INFRASPINATUS SUPRASPINATUS AND UPPER PORTIION OF THE SUBSCAP  WITH BICEPS TENODESIS;  Surgeon: Meredith Pel, MD;  Location: [place];  Service: Orthopedics;  Laterality: Right;   TONSILLECTOMY     TOTAL ABDOMINAL HYSTERECTOMY      There were no vitals filed for this visit.   Subjective Assessment - 04/26/21 1145     Subjective Pt. indicated seeing MD with good results noted from visit.  Pt. indicated she and MD discussed inclusion of balance  intervention as necessary.  Pt. indicated no pain upon arrival at rest.    Limitations House hold activities;Lifting    Patient Stated Goals Reduce pain, improve arm movement.    Currently in Pain? No/denies    Pain Score 0-No pain    Pain Onset More than a month ago                               [facility] Adult PT Treatment/Exercise - 04/26/21 0001       Neuro Re-ed    Neuro Re-ed Details  tandem stance 1 min x 1 bilateral, 30 sec x 2 bilateral SLS, stabilization Rt shoulder in 100 deg flexion c mild resistances 15 second bouts      Shoulder Exercises: Supine   Other Supine Exercises supine 3 lb bar flexion x 25      Shoulder Exercises: Standing   External Rotation Right    green tband walk out 5 sec hold x 10 (consistent cues required)   Flexion Both   2 x 15 c elevation to shrug/leaning compensation   Other Standing Exercises wall slide flexion x 15 2-3 second hold      Shoulder Exercises: ROM/Strengthening   UBE (Upper Arm Bike) Lvl 2.5 4 mins fwd/back each way                     PT Education - 04/26/21 1211     Education Details added tandem stance and single leg balance for home use (Pt. remembering by verbal, no handout  added.)    Person(s) Educated Patient    Methods Explanation;Demonstration;Verbal cues    Comprehension Verbalized understanding;Returned demonstration              PT Short Term Goals - 03/21/21 1620       PT SHORT TERM GOAL #1   Title Patient will demonstrate independent use of home exercise program to maintain progress from in clinic treatments.    Time 3    Period Weeks    Status Achieved    Target Date 03/25/21               PT Long Term Goals - 04/07/21 1125       PT LONG TERM GOAL #1   Title Patient will demonstrate/report pain at worst less than or equal to 2/10 to facilitate minimal limitation in daily activity secondary to pain symptoms.    Time 10    Period Weeks    Status On-going    Target Date 05/13/21      PT LONG TERM GOAL #2   Title Patient will demonstrate independent use of home exercise program to facilitate ability to maintain/progress functional gains from skilled physical therapy services.    Time 10    Period Weeks    Status On-going    Target Date 05/13/21      PT LONG TERM GOAL #3   Title Pt. will demonstrate FOTO outcome > or = 58% to indicated reduced disability due to condition.    Time 10    Period Weeks    Status Achieved      PT LONG TERM GOAL #4   Title Patient will demonstrate Rt [place] joint mobility with 15% of Lt to facilitate usual self care, dressing, reaching overhead at PLOF s limitation due to symptoms.    Time 10    Period Weeks    Status On-going  Target Date 05/13/21      PT LONG TERM GOAL #5   Title Patient will demonstrate Rt UE MMT > or = 4/5  throughout to facilitate usual lifting, carrying in functional activity to PLOF s limitation.    Time 10    Period Weeks    Status On-going    Target Date 05/13/21                   Plan - 04/26/21 1154     Clinical Impression Statement Fair to good static balance assessment performance today, added tandem and single leg to HEP to help address any fall concerns.  Continued focus on gaining Rt shoulder mobility and functional strength for daily use.  Continued skilled PT services indicated at this time.    Examination-Activity Limitations Reach Overhead;Caring for Others;Carry;Dressing;Lift;Sleep    Examination-Participation Restrictions Cleaning;Community Activity;Driving;Interpersonal Relationship;Occupation    Stability/Clinical Decision Making Stable/Uncomplicated    Rehab Potential --   fair to good   PT Frequency 2x / week    PT Duration Other (comment)   10 weeks   PT Treatment/Interventions ADLs/Self Care Home Management;Cryotherapy;Electrical Stimulation;Therapeutic exercise;Iontophoresis '4mg'$ /ml Dexamethasone;Moist Heat;Traction;Balance training;Therapeutic activities;Functional mobility training;Stair training;Gait training;DME Instruction;Ultrasound;Neuromuscular re-education;Patient/family education;Passive range of motion;Spinal Manipulations;Joint Manipulations;Dry needling;Taping;Vasopneumatic Device;Manual techniques    PT Next Visit Plan Functional arm elevation strengthening avoiding shrug, end range mobility gains.    PT Home Exercise Plan [email or web address]    Consulted and Agree with Plan of Care Patient             Patient will benefit from skilled therapeutic intervention in order to improve the following deficits and impairments:  Decreased endurance, Hypomobility, Increased edema, Decreased activity tolerance, Decreased strength, Impaired UE functional use,  Pain, Decreased mobility, Impaired perceived functional ability, Improper body mechanics, Postural dysfunction, Impaired flexibility, Decreased coordination, Decreased range of motion  Visit Diagnosis: Chronic right shoulder pain  Stiffness of right shoulder, not elsewhere classified  Muscle weakness (generalized)  Abnormal posture  Localized edema     Problem List Patient Active Problem List   Diagnosis Date Noted   Complete tear of right rotator cuff    Biceps tendonitis on right    S/P rotator cuff repair 02/08/2021   Chest discomfort 01/26/2014   Palpitations 01/26/2014   Cellulitis 09/08/2013   Abdominal pain, left mid abdomen, chronic 02/26/2013   Hyponatremia 10/30/2012   COPD, questioned 01/02/2012   Allergic rhinitis due to pollen 10/20/2010   MITRAL VALVE PROLAPSE, HX OF 05/26/2009   UNSPECIFIED HEARING LOSS 05/21/2009   RHINOSINUSITIS, 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   Scot Jun, PT, DPT, OCS, ATC 04/26/21  12:19 PM    [facility] [address] [place], [place], [address] Phone: [phone]   Fax:  [phone]  Name: LAMIYA BENEDEK MRN: [id number] Date of Birth: Sep 12, 1945

## 2021-04-28 ENCOUNTER — Encounter: Payer: Self-pay | Admitting: Rehabilitative and Restorative Service Providers"

## 2021-04-28 ENCOUNTER — Ambulatory Visit (INDEPENDENT_AMBULATORY_CARE_PROVIDER_SITE_OTHER): Payer: Medicare Other | Admitting: Rehabilitative and Restorative Service Providers"

## 2021-04-28 ENCOUNTER — Other Ambulatory Visit: Payer: Self-pay

## 2021-04-28 DIAGNOSIS — G8929 Other chronic pain: Secondary | ICD-10-CM | POA: Diagnosis not present

## 2021-04-28 DIAGNOSIS — M6281 Muscle weakness (generalized): Secondary | ICD-10-CM | POA: Diagnosis not present

## 2021-04-28 DIAGNOSIS — M25511 Pain in right shoulder: Secondary | ICD-10-CM | POA: Diagnosis not present

## 2021-04-28 DIAGNOSIS — M25611 Stiffness of right shoulder, not elsewhere classified: Secondary | ICD-10-CM

## 2021-04-28 DIAGNOSIS — R600 Localized edema: Secondary | ICD-10-CM

## 2021-04-28 DIAGNOSIS — R293 Abnormal posture: Secondary | ICD-10-CM | POA: Diagnosis not present

## 2021-04-28 NOTE — Therapy (Signed)
[facility] [address] [place], [place], [address] Phone: [phone]   Fax:  [phone]  Physical Therapy Treatment  Patient Details  Name: Haley Wade MRN: [id number] Date of Birth: [birth year] Referring Provider (PT): Dr. Meredith Pel   Encounter Date: 04/28/2021   PT End of Session - 04/28/21 1146     Visit Number 13    Number of Visits 20    Date for PT Re-Evaluation 05/13/21    Authorization Type Medicare, AARP - KX at 15    Progress Note Due on Visit 20    PT Start Time 1144    PT Stop Time 1223    PT Time Calculation (min) 39 min    Activity Tolerance Patient tolerated treatment well    Behavior During Therapy WFL for tasks assessed/performed             Past Medical History:  Diagnosis Date   Allergic rhinitis, cause unspecified    Anemia    Arthritis    Cellulitis    Chest discomfort    COPD, questioned    GAVE (gastric antral vascular ectasia)    Hyponatremia    MITRAL VALVE PROLAPSE, HX OF    Other chronic sinusitis    Palpitations    Syncope    Unspecified hearing loss     Past Surgical History:  Procedure Laterality Date   APPENDECTOMY     BACK SURGERY     BICEPT TENODESIS Right 02/08/2021   Procedure: BICEPS TENODESIS;  Surgeon: Meredith Pel, MD;  Location: [place];  Service: Orthopedics;  Laterality: Right;   BLADDER SURGERY     BOWEL RESECTION N/A 11/01/2012   Procedure: SMALL BOWEL RESECTION;  Surgeon: Earnstine Regal, MD;  [facility];  Service: General;  Laterality: N/A;   BREAST ENHANCEMENT SURGERY     revisions   BREAST SURGERY     CHOLECYSTECTOMY     COLON SURGERY     ESOPHAGOGASTRODUODENOSCOPY (EGD) WITH PROPOFOL N/A 10/14/2019   Procedure: ESOPHAGOGASTRODUODENOSCOPY (EGD) WITH PROPOFOL;  Surgeon: Clarene Essex, MD;  [facility];  Service: Endoscopy;  Laterality: N/A;   EYE SURGERY     GI RADIOFREQUENCY ABLATION  10/14/2019   Procedure: GI RADIOFREQUENCY  ABLATION;  Surgeon: Clarene Essex, MD;  [facility];  Service: Endoscopy;;   LAPAROSCOPY N/A 11/01/2012   Procedure: LAPAROSCOPY DIAGNOSTIC;  Surgeon: Earnstine Regal, MD;  [facility];  Service: General;  Laterality: N/A;   LAPAROTOMY N/A 11/01/2012   Procedure: EXPLORATORY LAPAROTOMY;  Surgeon: Earnstine Regal, MD;  [facility];  Service: General;  Laterality: N/A;   LYSIS OF ADHESION N/A 11/01/2012   Procedure: LYSIS OF ADHESION;  Surgeon: Earnstine Regal, MD;  [facility];  Service: General;  Laterality: N/A;   RHINOPLASTY     SHOULDER ARTHROSCOPY WITH OPEN ROTATOR CUFF REPAIR AND DISTAL CLAVICLE ACROMINECTOMY Right 02/08/2021   Procedure: SHOULDER ARTHROSCOPY WITH BICEPS TENDON RELEASE, MINI OPEN ROTATOR CUFF TEAR REPAIR OF THE INFRASPINATUS SUPRASPINATUS AND UPPER PORTIION OF THE SUBSCAP  WITH BICEPS TENODESIS;  Surgeon: Meredith Pel, MD;  Location: [place];  Service: Orthopedics;  Laterality: Right;   TONSILLECTOMY     TOTAL ABDOMINAL HYSTERECTOMY      There were no vitals filed for this visit.   Subjective Assessment - 04/28/21 1148     Subjective Pt. indicated she was able to get in the pool and do some exercise routine c good results.  no  pain indicated upon arrival today.    Limitations House hold activities;Lifting    Patient Stated Goals Reduce pain, improve arm movement.    Currently in Pain? No/denies    Pain Score 0-No pain    Pain Orientation Right    Pain Type Chronic pain    Pain Onset More than a month ago                [facility] PT Assessment - 04/28/21 0001       Assessment   Medical Diagnosis Rt RTC repair    Referring Provider (PT) Dr. Meredith Pel    Onset Date/Surgical Date 08/14/20    Hand Dominance Right      AROM   Overall AROM Comments Standing Rt shoulder elevation in flexion to 122 degrees                           [facility] Adult PT Treatment/Exercise - 04/28/21 0001       Shoulder Exercises:  Standing   External Rotation Right;Theraband   ER mobility x 10(poor control), isometric walk out 5 sec hold x 15   Theraband Level (Shoulder External Rotation) Level 3 (Green)    Internal Rotation Right   3 x 10 c towel at side   Theraband Level (Shoulder Internal Rotation) Level 3 (Green)    Flexion Right   2 x 10 1 lb 0-90 degrees, x 15 full range 0 lbs   Other Standing Exercises standing 90 deg flexion ball wall circles 30 x 2 bilateral    Other Standing Exercises wall pushup 20 x      Shoulder Exercises: Pulleys   Flexion 3 minutes    Scaption 3 minutes    Other Pulley Exercises 5 sec hold for pulley      Shoulder Exercises: ROM/Strengthening   UBE (Upper Arm Bike) Lvl 3 4 mins fwd/back each way c 15 sec interval at :45 to :60 each minute                       PT Short Term Goals - 03/21/21 1620       PT SHORT TERM GOAL #1   Title Patient will demonstrate independent use of home exercise program to maintain progress from in clinic treatments.    Time 3    Period Weeks    Status Achieved    Target Date 03/25/21               PT Long Term Goals - 04/07/21 1125       PT LONG TERM GOAL #1   Title Patient will demonstrate/report pain at worst less than or equal to 2/10 to facilitate minimal limitation in daily activity secondary to pain symptoms.    Time 10    Period Weeks    Status On-going    Target Date 05/13/21      PT LONG TERM GOAL #2   Title Patient will demonstrate independent use of home exercise program to facilitate ability to maintain/progress functional gains from skilled physical therapy services.    Time 10    Period Weeks    Status On-going    Target Date 05/13/21      PT LONG TERM GOAL #3   Title Pt. will demonstrate FOTO outcome > or = 58% to indicated reduced disability due to condition.    Time 10    Period Weeks    Status  Achieved      PT LONG TERM GOAL #4   Title Patient will demonstrate Rt [place] joint mobility with 15% of Lt  to facilitate usual self care, dressing, reaching overhead at PLOF s limitation due to symptoms.    Time 10    Period Weeks    Status On-going    Target Date 05/13/21      PT LONG TERM GOAL #5   Title Patient will demonstrate Rt UE MMT > or = 4/5  throughout to facilitate usual lifting, carrying in functional activity to PLOF s limitation.    Time 10    Period Weeks    Status On-going    Target Date 05/13/21                   Plan - 04/28/21 1214     Clinical Impression Statement Elevation quality continued to improve at this time. Difficulty in control of rotation strengthening at side (ER, IR ) c cues throughout to prevent compensatory movements.  Pt. has room to continue to improve but may be appropriate for starting transitioning towards HEP only in next few weeks.    Examination-Activity Limitations Reach Overhead;Caring for Others;Carry;Dressing;Lift;Sleep    Examination-Participation Restrictions Cleaning;Community Activity;Driving;Interpersonal Relationship;Occupation    Stability/Clinical Decision Making Stable/Uncomplicated    Rehab Potential --   fair to good   PT Frequency 2x / week    PT Duration Other (comment)   10 weeks   PT Treatment/Interventions ADLs/Self Care Home Management;Cryotherapy;Electrical Stimulation;Therapeutic exercise;Iontophoresis '4mg'$ /ml Dexamethasone;Moist Heat;Traction;Balance training;Therapeutic activities;Functional mobility training;Stair training;Gait training;DME Instruction;Ultrasound;Neuromuscular re-education;Patient/family education;Passive range of motion;Spinal Manipulations;Joint Manipulations;Dry needling;Taping;Vasopneumatic Device;Manual techniques    PT Next Visit Plan Elevation and stability against gravity    PT Home Exercise Plan [email or web address]    Consulted and Agree with Plan of Care Patient             Patient will benefit from skilled therapeutic intervention in order to improve the following deficits and impairments:   Decreased endurance, Hypomobility, Increased edema, Decreased activity tolerance, Decreased strength, Impaired UE functional use, Pain, Decreased mobility, Impaired perceived functional ability, Improper body mechanics, Postural dysfunction, Impaired flexibility, Decreased coordination, Decreased range of motion  Visit Diagnosis: Chronic right shoulder pain  Stiffness of right shoulder, not elsewhere classified  Muscle weakness (generalized)  Abnormal posture  Localized edema     Problem List Patient Active Problem List   Diagnosis Date Noted   Complete tear of right rotator cuff    Biceps tendonitis on right    S/P rotator cuff repair 02/08/2021   Chest discomfort 01/26/2014   Palpitations 01/26/2014   Cellulitis 09/08/2013   Abdominal pain, left mid abdomen, chronic 02/26/2013   Hyponatremia 10/30/2012   COPD, questioned 01/02/2012   Allergic rhinitis due to pollen 10/20/2010   MITRAL VALVE PROLAPSE, HX OF 05/26/2009   UNSPECIFIED HEARING LOSS 05/21/2009   RHINOSINUSITIS, 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   Scot Jun, PT, DPT, OCS, ATC 04/28/21  12:22 PM    [facility] [address] [place], [place], [address] Phone: [phone]   Fax:  [phone]  Name: Haley Wade MRN: [id number] Date of Birth: [birth year]

## 2021-05-01 NOTE — Progress Notes (Signed)
Post-Op Visit Note   Patient: Haley Wade           Date of Birth: Jun 30, 1946           MRN: [id number] Visit Date: 04/25/2021 PCP: Shon Baton, MD   Assessment & Plan:  Chief Complaint:  Chief Complaint  Patient presents with   Right Shoulder - Follow-up   Visit Diagnoses:  1. Gait difficulty     Plan: Aslynn is a 75 year old patient with right shoulder rotator cuff repair.  Since she was last seen she had a fall which affected the left side of her face.  [facility] doing [facility] program.  She is not having any pain.  She did have a fall 1 week ago as described but that did not affect her right shoulder.  She has a broken nose which is not immediately apparent upon examination.  On examination she has much improved range of motion with no coarse grinding or crepitus.  I think she will be functional to receive her award for education at [facility] within the next several weeks.  Plan at this time is 8-week return and add balance exercises upstairs during her physical therapy.  Continue 2 times a week for 8 more weeks.  Overall her shoulder looks good.  Follow-Up Instructions: No follow-ups on file.   Orders:  Orders Placed This Encounter  Procedures   Ambulatory referral to Physical Therapy   No orders of the defined types were placed in this encounter.   Imaging: No results found.  PMFS History: Patient Active Problem List   Diagnosis Date Noted   Complete tear of right rotator cuff    Biceps tendonitis on right    S/P rotator cuff repair 02/08/2021   Chest discomfort 01/26/2014   Palpitations 01/26/2014   Cellulitis 09/08/2013   Abdominal pain, left mid abdomen, chronic 02/26/2013   Hyponatremia 10/30/2012   COPD, questioned 01/02/2012   Allergic rhinitis due to pollen 10/20/2010   MITRAL VALVE PROLAPSE, HX OF 05/26/2009   UNSPECIFIED HEARING LOSS 05/21/2009   RHINOSINUSITIS, 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   Past Medical  History:  Diagnosis Date   Allergic rhinitis, cause unspecified    Anemia    Arthritis    Cellulitis    Chest discomfort    COPD, questioned    GAVE (gastric antral vascular ectasia)    Hyponatremia    MITRAL VALVE PROLAPSE, HX OF    Other chronic sinusitis    Palpitations    Syncope    Unspecified hearing loss     Family History  Problem Relation Age of Onset   Other Mother        c difficile gastroenteritis   Lung cancer Father        smoker   Liver disease Brother    Liver cancer Brother    Asthma Other        grandmother   Other Other        bronchitis-grandmother    Past Surgical History:  Procedure Laterality Date   APPENDECTOMY     BACK SURGERY     BICEPT TENODESIS Right 02/08/2021   Procedure: BICEPS TENODESIS;  Surgeon: Meredith Pel, MD;  Location: [place];  Service: Orthopedics;  Laterality: Right;   BLADDER SURGERY     BOWEL RESECTION N/A 11/01/2012   Procedure: SMALL BOWEL RESECTION;  Surgeon: Earnstine Regal, MD;  [facility];  Service: General;  Laterality: N/A;  BREAST ENHANCEMENT SURGERY     revisions   BREAST SURGERY     CHOLECYSTECTOMY     COLON SURGERY     ESOPHAGOGASTRODUODENOSCOPY (EGD) WITH PROPOFOL N/A 10/14/2019   Procedure: ESOPHAGOGASTRODUODENOSCOPY (EGD) WITH PROPOFOL;  Surgeon: Clarene Essex, MD;  [facility];  Service: Endoscopy;  Laterality: N/A;   EYE SURGERY     GI RADIOFREQUENCY ABLATION  10/14/2019   Procedure: GI RADIOFREQUENCY ABLATION;  Surgeon: Clarene Essex, MD;  [facility];  Service: Endoscopy;;   LAPAROSCOPY N/A 11/01/2012   Procedure: LAPAROSCOPY DIAGNOSTIC;  Surgeon: Earnstine Regal, MD;  [facility];  Service: General;  Laterality: N/A;   LAPAROTOMY N/A 11/01/2012   Procedure: EXPLORATORY LAPAROTOMY;  Surgeon: Earnstine Regal, MD;  [facility];  Service: General;  Laterality: N/A;   LYSIS OF ADHESION N/A 11/01/2012   Procedure: LYSIS OF ADHESION;  Surgeon: Earnstine Regal, MD;  [facility];  Service: General;  Laterality: N/A;   RHINOPLASTY     SHOULDER ARTHROSCOPY WITH OPEN ROTATOR CUFF REPAIR AND DISTAL CLAVICLE ACROMINECTOMY Right 02/08/2021   Procedure: SHOULDER ARTHROSCOPY WITH BICEPS TENDON RELEASE, MINI OPEN ROTATOR CUFF TEAR REPAIR OF THE INFRASPINATUS SUPRASPINATUS AND UPPER PORTIION OF THE SUBSCAP  WITH BICEPS TENODESIS;  Surgeon: Meredith Pel, MD;  Location: [place];  Service: Orthopedics;  Laterality: Right;   TONSILLECTOMY     TOTAL ABDOMINAL HYSTERECTOMY     Social History   Occupational History   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

## 2021-05-03 ENCOUNTER — Encounter: Payer: Self-pay | Admitting: Rehabilitative and Restorative Service Providers"

## 2021-05-03 ENCOUNTER — Other Ambulatory Visit: Payer: Self-pay

## 2021-05-03 ENCOUNTER — Ambulatory Visit (INDEPENDENT_AMBULATORY_CARE_PROVIDER_SITE_OTHER): Payer: Medicare Other | Admitting: Rehabilitative and Restorative Service Providers"

## 2021-05-03 DIAGNOSIS — G8929 Other chronic pain: Secondary | ICD-10-CM | POA: Diagnosis not present

## 2021-05-03 DIAGNOSIS — M25611 Stiffness of right shoulder, not elsewhere classified: Secondary | ICD-10-CM

## 2021-05-03 DIAGNOSIS — R600 Localized edema: Secondary | ICD-10-CM

## 2021-05-03 DIAGNOSIS — R293 Abnormal posture: Secondary | ICD-10-CM | POA: Diagnosis not present

## 2021-05-03 DIAGNOSIS — M25511 Pain in right shoulder: Secondary | ICD-10-CM | POA: Diagnosis not present

## 2021-05-03 DIAGNOSIS — M6281 Muscle weakness (generalized): Secondary | ICD-10-CM

## 2021-05-03 NOTE — Therapy (Addendum)
[facility] Physical Therapy [address] [place], [place], [address] Phone: [phone]   Fax:  [phone]  Physical Therapy Treatment/Discharge   Patient Details  Name: Haley Wade MRN: [id number] Date of Birth: [birth year] Referring Provider (PT): Dr. Meredith Pel   Encounter Date: 05/03/2021   PT End of Session - 05/03/21 1149     Visit Number 14    Number of Visits 20    Date for PT Re-Evaluation 05/13/21    Authorization Type Medicare, AARP - KX at 15    Progress Note Due on Visit 20    PT Start Time 1140    PT Stop Time 1220    PT Time Calculation (min) 40 min    Activity Tolerance Patient tolerated treatment well    Behavior During Therapy WFL for tasks assessed/performed             Past Medical History:  Diagnosis Date   Allergic rhinitis, cause unspecified    Anemia    Arthritis    Cellulitis    Chest discomfort    COPD, questioned    GAVE (gastric antral vascular ectasia)    Hyponatremia    MITRAL VALVE PROLAPSE, HX OF    Other chronic sinusitis    Palpitations    Syncope    Unspecified hearing loss     Past Surgical History:  Procedure Laterality Date   APPENDECTOMY     BACK SURGERY     BICEPT TENODESIS Right 02/08/2021   Procedure: BICEPS TENODESIS;  Surgeon: Meredith Pel, MD;  Location: [place];  Service: Orthopedics;  Laterality: Right;   BLADDER SURGERY     BOWEL RESECTION N/A 11/01/2012   Procedure: SMALL BOWEL RESECTION;  Surgeon: Earnstine Regal, MD;  [facility];  Service: General;  Laterality: N/A;   BREAST ENHANCEMENT SURGERY     revisions   BREAST SURGERY     CHOLECYSTECTOMY     COLON SURGERY     ESOPHAGOGASTRODUODENOSCOPY (EGD) WITH PROPOFOL N/A 10/14/2019   Procedure: ESOPHAGOGASTRODUODENOSCOPY (EGD) WITH PROPOFOL;  Surgeon: Clarene Essex, MD;  [facility];  Service: Endoscopy;  Laterality: N/A;   EYE SURGERY     GI RADIOFREQUENCY ABLATION  10/14/2019   Procedure: GI  RADIOFREQUENCY ABLATION;  Surgeon: Clarene Essex, MD;  [facility];  Service: Endoscopy;;   LAPAROSCOPY N/A 11/01/2012   Procedure: LAPAROSCOPY DIAGNOSTIC;  Surgeon: Earnstine Regal, MD;  [facility];  Service: General;  Laterality: N/A;   LAPAROTOMY N/A 11/01/2012   Procedure: EXPLORATORY LAPAROTOMY;  Surgeon: Earnstine Regal, MD;  [facility];  Service: General;  Laterality: N/A;   LYSIS OF ADHESION N/A 11/01/2012   Procedure: LYSIS OF ADHESION;  Surgeon: Earnstine Regal, MD;  [facility];  Service: General;  Laterality: N/A;   RHINOPLASTY     SHOULDER ARTHROSCOPY WITH OPEN ROTATOR CUFF REPAIR AND DISTAL CLAVICLE ACROMINECTOMY Right 02/08/2021   Procedure: SHOULDER ARTHROSCOPY WITH BICEPS TENDON RELEASE, MINI OPEN ROTATOR CUFF TEAR REPAIR OF THE INFRASPINATUS SUPRASPINATUS AND UPPER PORTIION OF THE SUBSCAP  WITH BICEPS TENODESIS;  Surgeon: Meredith Pel, MD;  Location: [place];  Service: Orthopedics;  Laterality: Right;   TONSILLECTOMY     TOTAL ABDOMINAL HYSTERECTOMY      There were no vitals filed for this visit.                      [facility] PT Treatment/Exercise - 05/03/21 0001  Exercises   Other Exercises  Cues for importance of HEP for continued improvements.      Shoulder Exercises: Sidelying   External Rotation Right   2 x 15 c towel at side   External Rotation Weight (lbs) 2      Shoulder Exercises: Standing   Other Standing Exercises standing flexion 0-90 degrees 1 lb 20x, standing abduction 0-90 degrees 0-90 0 lbs 3 x 10, standing small pball circles 90 deg flexion on wall 30 x 2 cw, ccw      Shoulder Exercises: ROM/Strengthening   UBE (Upper Arm Bike) Lvl 3.5 4 mins fwd/back each way      Manual Therapy   Manual therapy comments g3 inferior joint mobs in flexion/abduction, Mobilization c movement in ER in 45 deg abduction c posterior Rt gh glide                       PT Short Term Goals - 03/21/21 1620        PT SHORT TERM GOAL #1   Title Patient will demonstrate independent use of home exercise program to maintain progress from in clinic treatments.    Time 3    Period Weeks    Status Achieved    Target Date 03/25/21               PT Long Term Goals - 04/07/21 1125       PT LONG TERM GOAL #1   Title Patient will demonstrate/report pain at worst less than or equal to 2/10 to facilitate minimal limitation in daily activity secondary to pain symptoms.    Time 10    Period Weeks    Status On-going    Target Date 05/13/21      PT LONG TERM GOAL #2   Title Patient will demonstrate independent use of home exercise program to facilitate ability to maintain/progress functional gains from skilled physical therapy services.    Time 10    Period Weeks    Status On-going    Target Date 05/13/21      PT LONG TERM GOAL #3   Title Pt. will demonstrate FOTO outcome > or = 58% to indicated reduced disability due to condition.    Time 10    Period Weeks    Status Achieved      PT LONG TERM GOAL #4   Title Patient will demonstrate Rt [place] joint mobility with 15% of Lt to facilitate usual self care, dressing, reaching overhead at PLOF s limitation due to symptoms.    Time 10    Period Weeks    Status On-going    Target Date 05/13/21      PT LONG TERM GOAL #5   Title Patient will demonstrate Rt UE MMT > or = 4/5  throughout to facilitate usual lifting, carrying in functional activity to PLOF s limitation.    Time 10    Period Weeks    Status On-going    Target Date 05/13/21                   Plan - 05/03/21 1212     Clinical Impression Statement Extended discussion throughout visit on preparation for HEP transitioning in next few weeks.  Pt. has remaining end range mobility deficits consistent c prolonged capsular pattern restrictions as well as elevation strength deficits c resistance but has shown good progress overall to this point and plan for future HEP to continue to  help  progress in long term.  Discussed c Pt. that overall recovery may take months as she keeps working on condition in HEP.  Recommendation at this time for a few more weeks of skilled PT services c goal of improving confidence and performance in HEP for long term.    Examination-Activity Limitations Reach Overhead;Caring for Others;Carry;Dressing;Lift;Sleep    Examination-Participation Restrictions Cleaning;Community Activity;Driving;Interpersonal Relationship;Occupation    Stability/Clinical Decision Making Stable/Uncomplicated    Rehab Potential --   fair to good   PT Frequency 2x / week    PT Duration Other (comment)   10 weeks   PT Treatment/Interventions ADLs/Self Care Home Management;Cryotherapy;Electrical Stimulation;Therapeutic exercise;Iontophoresis 42m/ml Dexamethasone;Moist Heat;Traction;Balance training;Therapeutic activities;Functional mobility training;Stair training;Gait training;DME Instruction;Ultrasound;Neuromuscular re-education;Patient/family education;Passive range of motion;Spinal Manipulations;Joint Manipulations;Dry needling;Taping;Vasopneumatic Device;Manual techniques    PT Next Visit Plan Continued strengthening against gravity in functional    PT [place] Agree with Plan of Care Patient             Patient will benefit from skilled therapeutic intervention in order to improve the following deficits and impairments:  Decreased endurance, Hypomobility, Increased edema, Decreased activity tolerance, Decreased strength, Impaired UE functional use, Pain, Decreased mobility, Impaired perceived functional ability, Improper body mechanics, Postural dysfunction, Impaired flexibility, Decreased coordination, Decreased range of motion  Visit Diagnosis: Chronic right shoulder pain  Stiffness of right shoulder, not elsewhere classified  Muscle weakness (generalized)  Abnormal posture  Localized edema     Problem List Patient  Active Problem List   Diagnosis Date Noted   Complete tear of right rotator cuff    Biceps tendonitis on right    S/P rotator cuff repair 02/08/2021   Chest discomfort 01/26/2014   Palpitations 01/26/2014   Cellulitis 09/08/2013   Abdominal pain, left mid abdomen, chronic 02/26/2013   Hyponatremia 10/30/2012   COPD, questioned 01/02/2012   Allergic rhinitis due to pollen 10/20/2010   MITRAL VALVE PROLAPSE, HX OF 05/26/2009   UNSPECIFIED HEARING LOSS 05/21/2009   RHINOSINUSITIS, 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   MScot Jun PT, DPT, OCS, ATC 05/03/21  12:21 PM  PHYSICAL THERAPY DISCHARGE SUMMARY  Visits from Start of Care: 14  Current functional level related to goals / functional outcomes: See note   Remaining deficits: See note   Education / Equipment: HEP   Patient agrees to discharge. Patient goals were partially met. Patient is being discharged due to not returning since the last visit.  MScot Jun PT, DPT, OCS, ATC 07/19/21  10:46 AM     [facility] [address] [address]Phone: [phone]  Fax:  [phone] Name: Haley ROYLANCEMRN: [id number] of Birth: 1Mar 22, 1947

## 2021-05-05 ENCOUNTER — Encounter: Payer: Medicare Other | Admitting: Rehabilitative and Restorative Service Providers"

## 2021-05-06 DIAGNOSIS — H903 Sensorineural hearing loss, bilateral: Secondary | ICD-10-CM | POA: Diagnosis not present

## 2021-05-06 DIAGNOSIS — Z974 Presence of external hearing-aid: Secondary | ICD-10-CM | POA: Diagnosis not present

## 2021-05-06 DIAGNOSIS — H90A21 Sensorineural hearing loss, unilateral, right ear, with restricted hearing on the contralateral side: Secondary | ICD-10-CM | POA: Diagnosis not present

## 2021-05-06 DIAGNOSIS — S0181XA Laceration without foreign body of other part of head, initial encounter: Secondary | ICD-10-CM | POA: Diagnosis not present

## 2021-05-06 DIAGNOSIS — L905 Scar conditions and fibrosis of skin: Secondary | ICD-10-CM | POA: Diagnosis not present

## 2021-05-06 DIAGNOSIS — Z7952 Long term (current) use of systemic steroids: Secondary | ICD-10-CM | POA: Diagnosis not present

## 2021-05-06 DIAGNOSIS — S022XXA Fracture of nasal bones, initial encounter for closed fracture: Secondary | ICD-10-CM | POA: Diagnosis not present

## 2021-05-06 DIAGNOSIS — L308 Other specified dermatitis: Secondary | ICD-10-CM | POA: Diagnosis not present

## 2021-05-07 DIAGNOSIS — Z23 Encounter for immunization: Secondary | ICD-10-CM | POA: Diagnosis not present

## 2021-05-09 DIAGNOSIS — M154 Erosive (osteo)arthritis: Secondary | ICD-10-CM | POA: Diagnosis not present

## 2021-05-09 DIAGNOSIS — M79645 Pain in left finger(s): Secondary | ICD-10-CM | POA: Diagnosis not present

## 2021-05-09 DIAGNOSIS — M25442 Effusion, left hand: Secondary | ICD-10-CM | POA: Diagnosis not present

## 2021-05-09 DIAGNOSIS — M1120 Other chondrocalcinosis, unspecified site: Secondary | ICD-10-CM | POA: Diagnosis not present

## 2021-05-12 DIAGNOSIS — L905 Scar conditions and fibrosis of skin: Secondary | ICD-10-CM | POA: Diagnosis not present

## 2021-05-12 DIAGNOSIS — S022XXD Fracture of nasal bones, subsequent encounter for fracture with routine healing: Secondary | ICD-10-CM | POA: Diagnosis not present

## 2021-05-12 DIAGNOSIS — J3489 Other specified disorders of nose and nasal sinuses: Secondary | ICD-10-CM | POA: Diagnosis not present

## 2021-05-16 DIAGNOSIS — L905 Scar conditions and fibrosis of skin: Secondary | ICD-10-CM | POA: Diagnosis not present

## 2021-05-16 DIAGNOSIS — L821 Other seborrheic keratosis: Secondary | ICD-10-CM | POA: Diagnosis not present

## 2021-05-16 DIAGNOSIS — L281 Prurigo nodularis: Secondary | ICD-10-CM | POA: Diagnosis not present

## 2021-05-16 DIAGNOSIS — D225 Melanocytic nevi of trunk: Secondary | ICD-10-CM | POA: Diagnosis not present

## 2021-05-17 DIAGNOSIS — K5900 Constipation, unspecified: Secondary | ICD-10-CM | POA: Diagnosis not present

## 2021-05-17 DIAGNOSIS — E559 Vitamin D deficiency, unspecified: Secondary | ICD-10-CM | POA: Diagnosis not present

## 2021-05-17 DIAGNOSIS — M79643 Pain in unspecified hand: Secondary | ICD-10-CM | POA: Diagnosis not present

## 2021-05-17 DIAGNOSIS — M154 Erosive (osteo)arthritis: Secondary | ICD-10-CM | POA: Diagnosis not present

## 2021-05-17 DIAGNOSIS — M1990 Unspecified osteoarthritis, unspecified site: Secondary | ICD-10-CM | POA: Diagnosis not present

## 2021-05-17 DIAGNOSIS — I340 Nonrheumatic mitral (valve) insufficiency: Secondary | ICD-10-CM | POA: Diagnosis not present

## 2021-05-17 DIAGNOSIS — D649 Anemia, unspecified: Secondary | ICD-10-CM | POA: Diagnosis not present

## 2021-05-17 DIAGNOSIS — M8580 Other specified disorders of bone density and structure, unspecified site: Secondary | ICD-10-CM | POA: Diagnosis not present

## 2021-05-17 DIAGNOSIS — I4510 Unspecified right bundle-branch block: Secondary | ICD-10-CM | POA: Diagnosis not present

## 2021-05-17 DIAGNOSIS — M5380 Other specified dorsopathies, site unspecified: Secondary | ICD-10-CM | POA: Diagnosis not present

## 2021-05-17 DIAGNOSIS — K31819 Angiodysplasia of stomach and duodenum without bleeding: Secondary | ICD-10-CM | POA: Diagnosis not present

## 2021-05-17 DIAGNOSIS — R002 Palpitations: Secondary | ICD-10-CM | POA: Diagnosis not present

## 2021-05-19 ENCOUNTER — Encounter: Payer: Medicare Other | Admitting: Rehabilitative and Restorative Service Providers"

## 2021-05-25 DIAGNOSIS — L650 Telogen effluvium: Secondary | ICD-10-CM | POA: Diagnosis not present

## 2021-05-26 ENCOUNTER — Encounter: Payer: Medicare Other | Admitting: Rehabilitative and Restorative Service Providers"

## 2021-06-02 ENCOUNTER — Institutional Professional Consult (permissible substitution): Payer: Medicare Other | Admitting: Plastic Surgery

## 2021-06-11 DIAGNOSIS — Z23 Encounter for immunization: Secondary | ICD-10-CM | POA: Diagnosis not present

## 2021-06-20 ENCOUNTER — Ambulatory Visit (INDEPENDENT_AMBULATORY_CARE_PROVIDER_SITE_OTHER): Payer: Medicare Other | Admitting: Orthopedic Surgery

## 2021-06-20 ENCOUNTER — Other Ambulatory Visit: Payer: Self-pay

## 2021-06-20 ENCOUNTER — Ambulatory Visit: Payer: Medicare Other | Admitting: Orthopedic Surgery

## 2021-06-20 ENCOUNTER — Ambulatory Visit: Payer: Self-pay

## 2021-06-20 DIAGNOSIS — M79601 Pain in right arm: Secondary | ICD-10-CM | POA: Diagnosis not present

## 2021-06-20 DIAGNOSIS — M19011 Primary osteoarthritis, right shoulder: Secondary | ICD-10-CM | POA: Diagnosis not present

## 2021-06-26 ENCOUNTER — Encounter: Payer: Self-pay | Admitting: Orthopedic Surgery

## 2021-06-26 MED ORDER — LIDOCAINE HCL 1 % IJ SOLN
3.0000 mL | INTRAMUSCULAR | Status: AC | PRN
  Administered 2021-06-20: 3 mL

## 2021-06-26 MED ORDER — METHYLPREDNISOLONE ACETATE 40 MG/ML IJ SUSP
13.3300 mg | INTRAMUSCULAR | Status: AC | PRN
  Administered 2021-06-20: 13.33 mg via INTRA_ARTICULAR

## 2021-06-26 MED ORDER — BUPIVACAINE HCL 0.25 % IJ SOLN
0.6600 mL | INTRAMUSCULAR | Status: AC | PRN
  Administered 2021-06-20: .66 mL via INTRA_ARTICULAR

## 2021-06-26 NOTE — Progress Notes (Signed)
Office Visit Note   Patient: Haley Wade           Date of Birth: 28-Sep-1945           MRN: [id number] Visit Date: 06/20/2021 Requested by: Shon Baton, MD [address] [place],  [place] [address] PCP: Shon Baton, MD  Subjective: Chief Complaint  Patient presents with   Other    Right arm pain    HPI: Haley Wade is a 75 year old patient who underwent massive rotator cuff repair 01/31/2021.  She is having some right shoulder pain which she localizes to the [facility] joint.  Reports a radicular component as well.  He also describes some pain radiating into the scapula with neck pain as well.  It is down the arm and is also starting to radiate across to the upper trapezius on the left-hand side.  This started in October.  She had an MRI scan last March with no recent injections.  Her previous spine physician is in [place].  The pain does wake her up from sleep at night.  This is different type pain than what she had from her shoulder problem.  Her physician is Dr. Aletha Halim in [place] near [place].              ROS: All systems reviewed are negative as they relate to the chief complaint within the history of present illness.  Patient denies  fevers or chills.   Assessment & Plan: Visit Diagnoses:  1. Right arm pain     Plan: Impression is AC joint mediated shoulder pain with possible radiculopathy as well.  Ultrasound-guided injection into the [facility] joint is performed today on the right-hand side.  Refer to Dr. Ernestina Patches for C-spine injection.  Records may need to be retrieved from the neurosurgeon in [place] for assessment prior to the cervical spine injections.  Follow-Up Instructions: No follow-ups on file.   Orders:  Orders Placed This Encounter  Procedures   XR Cervical Spine 2 or 3 views   US Guided Needle Placement - No Linked Charges   Ambulatory referral to Physical Medicine Rehab   No orders of the defined types were placed in this encounter.      Procedures: Medium Joint Inj: R acromioclavicular on 06/20/2021 10:11 PM Indications: diagnostic evaluation and pain Details: 25 G 1.5 in needle, superior approach Medications: 3 mL lidocaine 1 %; 0.66 mL bupivacaine 0.25 %; 13.33 mg methylPREDNISolone acetate 40 MG/ML Outcome: tolerated well, no immediate complications Procedure, treatment alternatives, risks and benefits explained, specific risks discussed. Consent was given by the patient. Immediately prior to procedure a time out was called to verify the correct patient, procedure, equipment, support staff and site/side marked as required. Patient was prepped and draped in the usual sterile fashion.      Clinical Data: No additional findings.  Objective: Vital Signs: There were no vitals taken for this visit.  Physical Exam:   Constitutional: Patient appears well-developed HEENT:  Head: Normocephalic Eyes:EOM are normal Neck: Normal range of motion Cardiovascular: Normal rate Pulmonary/chest: Effort normal Neurologic: Patient is alert Skin: Skin is warm Psychiatric: Patient has normal mood and affect   Ortho Exam: Ortho exam demonstrates full passive range of motion of the right shoulder.  She has pretty reasonable strength infraspinatus demonstrate subscap muscle testing.  No masses lymphadenopathy or skin changes noted in that shoulder girdle region.  She has mild pain with cervical spine range of motion but no definite paresthesias C5-T1.  Reflex symmetric.  Radial pulse  intact bilaterally.  Specialty Comments:  No specialty comments available.  Imaging: No results found.   PMFS History: Patient Active Problem List   Diagnosis Date Noted   Complete tear of right rotator cuff    Biceps tendonitis on right    S/P rotator cuff repair 02/08/2021   Chest discomfort 01/26/2014   Palpitations 01/26/2014   Cellulitis 09/08/2013   Abdominal pain, left mid abdomen, chronic 02/26/2013   Hyponatremia 10/30/2012   COPD,  questioned 01/02/2012   Allergic rhinitis due to pollen 10/20/2010   MITRAL VALVE PROLAPSE, HX OF 05/26/2009   UNSPECIFIED HEARING LOSS 05/21/2009   RHINOSINUSITIS, 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   Past Medical History:  Diagnosis Date   Allergic rhinitis, cause unspecified    Anemia    Arthritis    Cellulitis    Chest discomfort    COPD, questioned    GAVE (gastric antral vascular ectasia)    Hyponatremia    MITRAL VALVE PROLAPSE, HX OF    Other chronic sinusitis    Palpitations    Syncope    Unspecified hearing loss     Family History  Problem Relation Age of Onset   Other Mother        c difficile gastroenteritis   Lung cancer Father        smoker   Liver disease Brother    Liver cancer Brother    Asthma Other        grandmother   Other Other        bronchitis-grandmother    Past Surgical History:  Procedure Laterality Date   APPENDECTOMY     BACK SURGERY     BICEPT TENODESIS Right 02/08/2021   Procedure: BICEPS TENODESIS;  Surgeon: Meredith Pel, MD;  Location: [place];  Service: Orthopedics;  Laterality: Right;   BLADDER SURGERY     BOWEL RESECTION N/A 11/01/2012   Procedure: SMALL BOWEL RESECTION;  Surgeon: Earnstine Regal, MD;  [facility];  Service: General;  Laterality: N/A;   BREAST ENHANCEMENT SURGERY     revisions   BREAST SURGERY     CHOLECYSTECTOMY     COLON SURGERY     ESOPHAGOGASTRODUODENOSCOPY (EGD) WITH PROPOFOL N/A 10/14/2019   Procedure: ESOPHAGOGASTRODUODENOSCOPY (EGD) WITH PROPOFOL;  Surgeon: Clarene Essex, MD;  [facility];  Service: Endoscopy;  Laterality: N/A;   EYE SURGERY     GI RADIOFREQUENCY ABLATION  10/14/2019   Procedure: GI RADIOFREQUENCY ABLATION;  Surgeon: Clarene Essex, MD;  [facility];  Service: Endoscopy;;   LAPAROSCOPY N/A 11/01/2012   Procedure: LAPAROSCOPY DIAGNOSTIC;  Surgeon: Earnstine Regal, MD;  [facility];  Service: General;  Laterality: N/A;   LAPAROTOMY N/A 11/01/2012   Procedure: EXPLORATORY  LAPAROTOMY;  Surgeon: Earnstine Regal, MD;  [facility];  Service: General;  Laterality: N/A;   LYSIS OF ADHESION N/A 11/01/2012   Procedure: LYSIS OF ADHESION;  Surgeon: Earnstine Regal, MD;  [facility];  Service: General;  Laterality: N/A;   RHINOPLASTY     SHOULDER ARTHROSCOPY WITH OPEN ROTATOR CUFF REPAIR AND DISTAL CLAVICLE ACROMINECTOMY Right 02/08/2021   Procedure: SHOULDER ARTHROSCOPY WITH BICEPS TENDON RELEASE, MINI OPEN ROTATOR CUFF TEAR REPAIR OF THE INFRASPINATUS SUPRASPINATUS AND UPPER PORTIION OF THE SUBSCAP  WITH BICEPS TENODESIS;  Surgeon: Meredith Pel, MD;  Location: [place];  Service: Orthopedics;  Laterality: Right;   TONSILLECTOMY     TOTAL ABDOMINAL HYSTERECTOMY     Social History   Occupational History  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

## 2021-07-04 ENCOUNTER — Other Ambulatory Visit: Payer: Self-pay | Admitting: Psychiatry

## 2021-07-04 ENCOUNTER — Telehealth: Payer: Self-pay | Admitting: Psychiatry

## 2021-07-04 MED ORDER — TRAZODONE HCL 50 MG PO TABS: 50.0000 mg | ORAL_TABLET | Freq: Every evening | ORAL | 0 refills | Status: DC | PRN

## 2021-07-04 NOTE — Telephone Encounter (Signed)
Thank you for the information.  I sent in a prescription for trazodone to [facility]

## 2021-07-04 NOTE — Telephone Encounter (Signed)
Haley Wade said when she was seen in September she was having difficulty with the Zolpidem waking up in the morning and then when she wakes up she will be up for hours. She asks if there is another medication that doesn't have as many side effects as Zolpidem? Her phone number is [phone]. Pharmacy is:  [facility] - [place], [place] - [address]  Phone:  [phone]  Fax:  [phone]

## 2021-07-05 DIAGNOSIS — M47812 Spondylosis without myelopathy or radiculopathy, cervical region: Secondary | ICD-10-CM | POA: Diagnosis not present

## 2021-07-05 DIAGNOSIS — M7918 Myalgia, other site: Secondary | ICD-10-CM | POA: Diagnosis not present

## 2021-07-05 DIAGNOSIS — Z681 Body mass index (BMI) 19 or less, adult: Secondary | ICD-10-CM | POA: Diagnosis not present

## 2021-07-05 DIAGNOSIS — M5030 Other cervical disc degeneration, unspecified cervical region: Secondary | ICD-10-CM | POA: Diagnosis not present

## 2021-07-05 DIAGNOSIS — R030 Elevated blood-pressure reading, without diagnosis of hypertension: Secondary | ICD-10-CM | POA: Diagnosis not present

## 2021-07-06 DIAGNOSIS — H2513 Age-related nuclear cataract, bilateral: Secondary | ICD-10-CM | POA: Diagnosis not present

## 2021-07-12 DIAGNOSIS — H2513 Age-related nuclear cataract, bilateral: Secondary | ICD-10-CM | POA: Diagnosis not present

## 2021-07-21 DIAGNOSIS — H2511 Age-related nuclear cataract, right eye: Secondary | ICD-10-CM | POA: Diagnosis not present

## 2021-07-22 ENCOUNTER — Ambulatory Visit: Payer: Medicare Other | Admitting: Rehabilitative and Restorative Service Providers"

## 2021-07-28 DIAGNOSIS — H2512 Age-related nuclear cataract, left eye: Secondary | ICD-10-CM | POA: Diagnosis not present

## 2021-08-01 ENCOUNTER — Ambulatory Visit: Payer: Medicare Other | Admitting: Orthopedic Surgery

## 2021-08-05 ENCOUNTER — Ambulatory Visit: Payer: Medicare Other | Admitting: Rehabilitative and Restorative Service Providers"

## 2021-08-10 DIAGNOSIS — Z01419 Encounter for gynecological examination (general) (routine) without abnormal findings: Secondary | ICD-10-CM | POA: Diagnosis not present

## 2021-08-10 DIAGNOSIS — Z9079 Acquired absence of other genital organ(s): Secondary | ICD-10-CM | POA: Diagnosis not present

## 2021-08-10 DIAGNOSIS — N952 Postmenopausal atrophic vaginitis: Secondary | ICD-10-CM | POA: Diagnosis not present

## 2021-08-10 DIAGNOSIS — Z681 Body mass index (BMI) 19 or less, adult: Secondary | ICD-10-CM | POA: Diagnosis not present

## 2021-08-10 DIAGNOSIS — Z01411 Encounter for gynecological examination (general) (routine) with abnormal findings: Secondary | ICD-10-CM | POA: Diagnosis not present

## 2021-08-10 DIAGNOSIS — Z79890 Hormone replacement therapy (postmenopausal): Secondary | ICD-10-CM | POA: Diagnosis not present

## 2021-08-10 DIAGNOSIS — Z124 Encounter for screening for malignant neoplasm of cervix: Secondary | ICD-10-CM | POA: Diagnosis not present

## 2021-08-22 DIAGNOSIS — L300 Nummular dermatitis: Secondary | ICD-10-CM | POA: Diagnosis not present

## 2021-08-22 DIAGNOSIS — B0089 Other herpesviral infection: Secondary | ICD-10-CM | POA: Diagnosis not present

## 2021-08-24 ENCOUNTER — Encounter: Payer: Self-pay | Admitting: Orthopedic Surgery

## 2021-08-24 ENCOUNTER — Ambulatory Visit (INDEPENDENT_AMBULATORY_CARE_PROVIDER_SITE_OTHER): Payer: Medicare Other | Admitting: Rehabilitative and Restorative Service Providers"

## 2021-08-24 ENCOUNTER — Telehealth: Payer: Self-pay

## 2021-08-24 ENCOUNTER — Ambulatory Visit (INDEPENDENT_AMBULATORY_CARE_PROVIDER_SITE_OTHER): Payer: Medicare Other | Admitting: Orthopedic Surgery

## 2021-08-24 ENCOUNTER — Other Ambulatory Visit: Payer: Self-pay

## 2021-08-24 ENCOUNTER — Encounter: Payer: Self-pay | Admitting: Rehabilitative and Restorative Service Providers"

## 2021-08-24 DIAGNOSIS — M1711 Unilateral primary osteoarthritis, right knee: Secondary | ICD-10-CM | POA: Diagnosis not present

## 2021-08-24 DIAGNOSIS — M542 Cervicalgia: Secondary | ICD-10-CM | POA: Diagnosis not present

## 2021-08-24 DIAGNOSIS — M19011 Primary osteoarthritis, right shoulder: Secondary | ICD-10-CM | POA: Diagnosis not present

## 2021-08-24 NOTE — Progress Notes (Signed)
Office Visit Note   Patient: Haley Wade           Date of Birth: October 26, 1945           MRN: [id number] Visit Date: 08/24/2021 Requested by: Shon Baton, MD [address] [place],  [place] [address] PCP: Shon Baton, MD  Subjective: Chief Complaint  Patient presents with   Right Shoulder - Follow-up    HPI: Haley Wade is a 76 y.o. female who presents to the office complaining of right shoulder pain.  Patient returns following right shoulder AC joint injection in November 2022.  Doing very well following the injection and her pain is all but resolved.  She describes a pain level of 0 out of 10.  She has very mild episodic pain in the shoulder but nothing like it was prior to the injection.  She is currently having pretty much no pain in her right arm and neck.  She did go to [place] neurosurgery for evaluation and consideration of cervical spine ESI but after reviewing the MRI she was told that she "does not need any injections at this time".  She is okay with this.  Her right knee is giving her some discomfort as it has in the past.  She has a history of knee arthritis.  Last gel injection in May 2021 gave her excellent relief and she would like to repeat this at some point in the future but not right now as she wants to have a "break from going to the doctor".  She walks a lot and walks about 7.4 miles..                ROS: All systems reviewed are negative as they relate to the chief complaint within the history of present illness.  Patient denies fevers or chills.  Assessment & Plan: Visit Diagnoses:  1. Arthritis of right acromioclavicular joint   2. Unilateral primary osteoarthritis, right knee     Plan: Patient is a 76 year old female who presents for reevaluation of right shoulder pain following AC joint injection several months ago.  She has had excellent relief following the injection.  No real pain or tenderness on exam today.  Plan for her to follow-up with the  office as needed for [place] joint injection in the future if her pain flares back up.  Also preapproved her for gel injections with her history of right knee arthritis.  She has had excellent relief from gel injections back in mid 2021.  The Euflexxa 3 injection series has been the most beneficial for her and provided good relief last time.  Follow-up as needed once injections are approved.  This patient is diagnosed with osteoarthritis of the knee(s).    Radiographs show evidence of joint space narrowing, osteophytes, subchondral sclerosis and/or subchondral cysts.  This patient has knee pain which interferes with functional and activities of daily living.    This patient has experienced inadequate response, adverse effects and/or intolerance with conservative treatments such as acetaminophen, NSAIDS, topical creams, physical therapy or regular exercise, knee bracing and/or weight loss.   This patient has experienced inadequate response or has a contraindication to intra articular steroid injections for at least 3 months.   This patient is not scheduled to have a total knee replacement within 6 months of starting treatment with viscosupplementation.   Follow-Up Instructions: No follow-ups on file.   Orders:  No orders of the defined types were placed in this encounter.  No orders of  the defined types were placed in this encounter.     Procedures: No procedures performed   Clinical Data: No additional findings.  Objective: Vital Signs: There were no vitals taken for this visit.  Physical Exam:  Constitutional: Patient appears well-developed HEENT:  Head: Normocephalic Eyes:EOM are normal Neck: Normal range of motion Cardiovascular: Normal rate Pulmonary/chest: Effort normal Neurologic: Patient is alert Skin: Skin is warm Psychiatric: Patient has normal mood and affect  Ortho Exam: Ortho exam demonstrates right knee without effusion.  0 degrees extension and greater than 120  degrees of knee flexion.  No tenderness over the lateral compartment, mild tenderness over the medial compartment.  Able to perform straight leg raise without extensor lag.  No pain with hip range of motion.  Right shoulder with well-preserved passive and active range of motion.  No rotator cuff strength weakness.  Very minimal tenderness over the [facility] joint.  No pain with crossarm adduction.  No pain with passive AB duction.  Specialty Comments:  No specialty comments available.  Imaging: No results found.   PMFS History: Patient Active Problem List   Diagnosis Date Noted   Complete tear of right rotator cuff    Biceps tendonitis on right    S/P rotator cuff repair 02/08/2021   Chest discomfort 01/26/2014   Palpitations 01/26/2014   Cellulitis 09/08/2013   Abdominal pain, left mid abdomen, chronic 02/26/2013   Hyponatremia 10/30/2012   COPD, questioned 01/02/2012   Allergic rhinitis due to pollen 10/20/2010   MITRAL VALVE PROLAPSE, HX OF 05/26/2009   UNSPECIFIED HEARING LOSS 05/21/2009   RHINOSINUSITIS, 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   Past Medical History:  Diagnosis Date   Allergic rhinitis, cause unspecified    Anemia    Arthritis    Cellulitis    Chest discomfort    COPD, questioned    GAVE (gastric antral vascular ectasia)    Hyponatremia    MITRAL VALVE PROLAPSE, HX OF    Other chronic sinusitis    Palpitations    Syncope    Unspecified hearing loss     Family History  Problem Relation Age of Onset   Other Mother        c difficile gastroenteritis   Lung cancer Father        smoker   Liver disease Brother    Liver cancer Brother    Asthma Other        grandmother   Other Other        bronchitis-grandmother    Past Surgical History:  Procedure Laterality Date   APPENDECTOMY     BACK SURGERY     BICEPT TENODESIS Right 02/08/2021   Procedure: BICEPS TENODESIS;  Surgeon: Meredith Pel, MD;  Location: [place];  Service: Orthopedics;  Laterality: Right;   BLADDER  SURGERY     BOWEL RESECTION N/A 11/01/2012   Procedure: SMALL BOWEL RESECTION;  Surgeon: Earnstine Regal, MD;  [facility];  Service: General;  Laterality: N/A;   BREAST ENHANCEMENT SURGERY     revisions   BREAST SURGERY     CHOLECYSTECTOMY     COLON SURGERY     ESOPHAGOGASTRODUODENOSCOPY (EGD) WITH PROPOFOL N/A 10/14/2019   Procedure: ESOPHAGOGASTRODUODENOSCOPY (EGD) WITH PROPOFOL;  Surgeon: Clarene Essex, MD;  [facility];  Service: Endoscopy;  Laterality: N/A;   EYE SURGERY     GI RADIOFREQUENCY ABLATION  10/14/2019   Procedure: GI RADIOFREQUENCY ABLATION;  Surgeon: Clarene Essex, MD;  [facility];  Service: Endoscopy;;   LAPAROSCOPY  N/A 11/01/2012   Procedure: LAPAROSCOPY DIAGNOSTIC;  Surgeon: Earnstine Regal, MD;  [facility];  Service: General;  Laterality: N/A;   LAPAROTOMY N/A 11/01/2012   Procedure: EXPLORATORY LAPAROTOMY;  Surgeon: Earnstine Regal, MD;  [facility];  Service: General;  Laterality: N/A;   LYSIS OF ADHESION N/A 11/01/2012   Procedure: LYSIS OF ADHESION;  Surgeon: Earnstine Regal, MD;  [facility];  Service: General;  Laterality: N/A;   RHINOPLASTY     SHOULDER ARTHROSCOPY WITH OPEN ROTATOR CUFF REPAIR AND DISTAL CLAVICLE ACROMINECTOMY Right 02/08/2021   Procedure: SHOULDER ARTHROSCOPY WITH BICEPS TENDON RELEASE, MINI OPEN ROTATOR CUFF TEAR REPAIR OF THE INFRASPINATUS SUPRASPINATUS AND UPPER PORTIION OF THE SUBSCAP  WITH BICEPS TENODESIS;  Surgeon: Meredith Pel, MD;  Location: [place];  Service: Orthopedics;  Laterality: Right;   TONSILLECTOMY     TOTAL ABDOMINAL HYSTERECTOMY     Social History   Occupational History   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

## 2021-08-24 NOTE — Therapy (Signed)
[facility] Physical Therapy [address] [place], [place], [address] Phone: [phone]   Fax:  [phone]  Physical Therapy Evaluation Only, Discharge Summary  Patient Details  Name: Haley Wade MRN: [id number] Date of Birth: 31-Mar-1946 Referring Provider (PT): Nathanial Millman. Bartko   Encounter Date: 08/24/2021   PT End of Session - 08/24/21 1041     Visit Number 1    Number of Visits 1    Date for PT Re-Evaluation --    Authorization Type Medicare, [place].  KX at 15    Progress Note Due on Visit 10    PT Start Time 1048    PT Stop Time 1110    PT Time Calculation (min) 22 min    Activity Tolerance Patient tolerated treatment well    Behavior During Therapy WFL for tasks assessed/performed             Past Medical History:  Diagnosis Date   Allergic rhinitis, cause unspecified    Anemia    Arthritis    Cellulitis    Chest discomfort    COPD, questioned    GAVE (gastric antral vascular ectasia)    Hyponatremia    MITRAL VALVE PROLAPSE, HX OF    Other chronic sinusitis    Palpitations    Syncope    Unspecified hearing loss     Past Surgical History:  Procedure Laterality Date   APPENDECTOMY     BACK SURGERY     BICEPT TENODESIS Right 02/08/2021   Procedure: BICEPS TENODESIS;  Surgeon: Meredith Pel, MD;  Location: [place];  Service: Orthopedics;  Laterality: Right;   BLADDER SURGERY     BOWEL RESECTION N/A 11/01/2012   Procedure: SMALL BOWEL RESECTION;  Surgeon: Earnstine Regal, MD;  Location: [facility];  Service: General;  Laterality: N/A;   BREAST ENHANCEMENT SURGERY     revisions   BREAST SURGERY     CHOLECYSTECTOMY     COLON SURGERY     ESOPHAGOGASTRODUODENOSCOPY (EGD) WITH PROPOFOL N/A 10/14/2019   Procedure: ESOPHAGOGASTRODUODENOSCOPY (EGD) WITH PROPOFOL;  Surgeon: Clarene Essex, MD;  [facility];  Service: Endoscopy;  Laterality: N/A;   EYE SURGERY     GI RADIOFREQUENCY ABLATION  10/14/2019   Procedure: GI  RADIOFREQUENCY ABLATION;  Surgeon: Clarene Essex, MD;  [facility];  Service: Endoscopy;;   LAPAROSCOPY N/A 11/01/2012   Procedure: LAPAROSCOPY DIAGNOSTIC;  Surgeon: Earnstine Regal, MD;  Location: [facility];  Service: General;  Laterality: N/A;   LAPAROTOMY N/A 11/01/2012   Procedure: EXPLORATORY LAPAROTOMY;  Surgeon: Earnstine Regal, MD;  Location[facility];  Service: General;  Laterality: N/A;   LYSIS OF ADHESION N/A 11/01/2012   Procedure: LYSIS OF ADHESION;  Surgeon: Earnstine Regal, MD;  Location: [facility];  Service: General;  Laterality: N/A;   RHINOPLASTY     SHOULDER ARTHROSCOPY WITH OPEN ROTATOR CUFF REPAIR AND DISTAL CLAVICLE ACROMINECTOMY Right 02/08/2021   Procedure: SHOULDER ARTHROSCOPY WITH BICEPS TENDON RELEASE, MINI OPEN ROTATOR CUFF TEAR REPAIR OF THE INFRASPINATUS SUPRASPINATUS AND UPPER PORTIION OF THE SUBSCAP  WITH BICEPS TENODESIS;  Surgeon: Meredith Pel, MD;  Location: [place];  Service: Orthopedics;  Laterality: Right;   TONSILLECTOMY     TOTAL ABDOMINAL HYSTERECTOMY      There were no vitals filed for this visit.    Subjective Assessment - 08/24/21 1043     Subjective Pt. indicated she was having pain in neck previously and also having some pain in top of  shoulder.  Injection helped the shoulder alot.  Pt. indicated in time since visit to referring MD put in referral she has been doing better.  Pt. indicated when pain was present, she had pain in center of neck to back of both shoulders.  Pt. indicated she was limited in water aerobics, lifting things at home.  Pt. indicated these aren't problems any more.    Pertinent History History of injection, medial branch blocks in cervical region.  Fall history c Rt shoulder injury (seen previously by clinic)    Currently in Pain? No/denies    Pain Score 0-No pain    Pain Location --    Pain Type --    Pain Onset --                [facility] PT Assessment - 08/24/21 0001       Assessment   Medical Diagnosis M79.18  (ICD-10-CM) - Myalgia, other site    Referring Provider (PT) Nathanial Millman. Bartko    Onset Date/Surgical Date 06/14/21    Hand Dominance Right    Prior Therapy Previously for shoulder      Precautions   Precautions None      Restrictions   Weight Bearing Restrictions No      Balance Screen   Has the patient fallen in the past 6 months Yes    How many times? 1    Has the patient had a decrease in activity level because of a fear of falling?  No    Is the patient reluctant to leave their home because of a fear of falling?  No      Home Ecologist residence      Prior Function   Level of Independence Independent      Observation/Other Assessments   Focus on Therapeutic Outcomes (FOTO)  intake 76%      Posture/Postural Control   Posture Comments Mild rounded shoulders      ROM / Strength   AROM / PROM / Strength AROM;PROM;Strength      AROM   Overall AROM Comments no pain complaints    AROM Assessment Site Cervical;Shoulder    Right/Left Shoulder Left;Right    Cervical Extension 56    Cervical - Right Rotation 72    Cervical - Left Rotation 65      Strength   Strength Assessment Site Shoulder;Elbow    Right/Left Shoulder Left;Right    Right/Left Elbow Left;Right                        Objective measurements completed on examination: See above findings.       [facility] PT Treatment/Exercise - 08/24/21 0001       Exercises   Exercises Other Exercises    Other Exercises  HEP instruction/performance c cues for techniques, handout provided.  Trial set performed of each for comprehension and symptom assessment.  HEP consisting of cervical retraction, scapular retraction, cervical rotation, tband rows, tband gh ext.                     PT Education - 08/24/21 1040     Education Details HEP, POC    Person(s) Educated Patient    Methods Explanation;Demonstration;Verbal cues;Handout    Comprehension Verbalized  understanding;Returned demonstration              PT Short Term Goals - 08/24/21 1041       PT  SHORT TERM GOAL #1   Title Patient will demonstrate independent use of home exercise program to maintain progress from in clinic treatments.    Time 1    Period Days    Status Achieved    Target Date 08/24/21               PT Long Term Goals - 08/24/21 1041       PT LONG TERM GOAL #1   Title --    Time --    Period --    Status --    Target Date --      PT LONG TERM GOAL #2   Title --    Time --    Period --    Status --    Target Date --      PT LONG TERM GOAL #3   Title --    Time --    Period --    Status --    Target Date --      PT LONG TERM GOAL #4   Title --    Time --    Period --    Status --    Target Date --                    Plan - 08/24/21 1042     Clinical Impression Statement Patient is a 76 y.o. who comes to clinic with history of complaints of cervical pain but improved at this time.  Mild impairment in cervical mobility and posture noted.  Patient to benefit from skilled PT services (HEP today) to address impairments and limitations to improve to previous level of function without restriction secondary to condition.  One time visit today due to clinical presentation.    Personal Factors and Comorbidities Other   history of Rt shoulder impairments/limitations   Examination-Activity Limitations Lift    Examination-Participation Restrictions Community Activity    Stability/Clinical Decision Making Stable/Uncomplicated    Clinical Decision Making Low    Rehab Potential Good    PT Frequency One time visit    PT Duration --    PT Treatment/Interventions ADLs/Self Care Home Management;Electrical Stimulation;Therapeutic activities;Iontophoresis 19m/ml Dexamethasone;Moist Heat;Traction;Balance training;Therapeutic exercise;Functional mobility training;Stair training;Cryotherapy;Gait training;Ultrasound;Neuromuscular  re-education;Patient/family education;Passive range of motion;Spinal Manipulations;Joint Manipulations;Dry needling;Taping;Manual techniques    PT Next Visit Plan D/C to HEP    PT Home Exercise Plan [email or web address]    Consulted and Agree with Plan of Care Patient             Patient will benefit from skilled therapeutic intervention in order to improve the following deficits and impairments:  Hypomobility, Decreased range of motion  Visit Diagnosis: Cervicalgia     Problem List Patient Active Problem List   Diagnosis Date Noted   Complete tear of right rotator cuff    Biceps tendonitis on right    S/P rotator cuff repair 02/08/2021   Chest discomfort 01/26/2014   Palpitations 01/26/2014   Cellulitis 09/08/2013   Abdominal pain, left mid abdomen, chronic 02/26/2013   Hyponatremia 10/30/2012   COPD, questioned 01/02/2012   Allergic rhinitis due to pollen 10/20/2010   MITRAL VALVE PROLAPSE, HX OF 05/26/2009   UNSPECIFIED HEARING LOSS 05/21/2009   RHINOSINUSITIS, 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    PHYSICAL THERAPY DISCHARGE SUMMARY  Visits from Start of Care: 1  Current functional level related to goals / functional outcomes: See note   Remaining deficits: See note   Education / Equipment: HEP     Patient agrees  to discharge. Patient goals were met. Patient is being discharged due to meeting the stated rehab goals.  Scot Jun, PT, DPT, OCS, ATC 08/24/21  11:16 AM     [facility] Physical Therapy [address] [place], [place], [address] Phone: [phone]   Fax:  [phone]  Name: SHATERRICA TERRITO MRN: [id number] Date of Birth: 1946/07/24

## 2021-08-24 NOTE — Telephone Encounter (Signed)
Can we please get auth for right knee euflexxa series for patient?

## 2021-08-24 NOTE — Patient Instructions (Signed)
Access Code: [email or web address] URL: [email or web address] Date: 08/24/2021 Prepared by: Scot Jun  Exercises Seated Scapular Retraction - 1-2 x daily - 7 x weekly - 1-2 sets - 10 reps - 5 hold Seated Cervical Retraction - 1-2 x daily - 7 x weekly - 1-2 sets - 10 reps Standing Shoulder Row with Anchored Resistance - 1-2 x daily - 7 x weekly - 3 sets - 10 reps Shoulder Extension with Resistance - 1-2 x daily - 7 x weekly - 3 sets - 10 reps Seated Cervical Rotation AROM - 1-2 x daily - 7 x weekly - 1-2 sets - 10 reps

## 2021-08-25 ENCOUNTER — Other Ambulatory Visit: Payer: Self-pay | Admitting: Gastroenterology

## 2021-08-25 DIAGNOSIS — R109 Unspecified abdominal pain: Secondary | ICD-10-CM

## 2021-08-25 DIAGNOSIS — K31819 Angiodysplasia of stomach and duodenum without bleeding: Secondary | ICD-10-CM | POA: Diagnosis not present

## 2021-08-26 NOTE — Telephone Encounter (Signed)
Noted  

## 2021-09-02 ENCOUNTER — Other Ambulatory Visit: Payer: Self-pay

## 2021-09-02 ENCOUNTER — Ambulatory Visit (INDEPENDENT_AMBULATORY_CARE_PROVIDER_SITE_OTHER): Payer: Medicare Other | Admitting: Surgical

## 2021-09-02 DIAGNOSIS — M25461 Effusion, right knee: Secondary | ICD-10-CM

## 2021-09-04 ENCOUNTER — Encounter: Payer: Self-pay | Admitting: Surgical

## 2021-09-04 NOTE — Progress Notes (Signed)
Office Visit Note   Patient: Haley Wade           Date of Birth: [birth year]           MRN: [id number] Visit Date: 09/02/2021 Requested by: Shon Baton, MD [address] [place],  [place] [address] PCP: Shon Baton, MD  Subjective: Chief Complaint  Patient presents with   Right Knee - Pain   Lower Back - Pain    HPI: Haley Wade is a 76 y.o. female who presents to the office complaining of right knee pain.  Patient complains of increased right knee pain since she woke up with pain on Wednesday.  She states that she denies any history of injury leading to worsening of her knee pain.  She went to bed just fine on Tuesday night and had no recent change in her diet.  She woke up on Wednesday with moderate swelling in her knee, warmth, increased pain.  She has been able to ambulate with antalgia.  Denies any fevers, chills, night sweats.  She has been using a brace which helps her knee feel like it does not give out on her.  She denies any known history of gout..                ROS: All systems reviewed are negative as they relate to the chief complaint within the history of present illness.  Patient denies fevers or chills.  Assessment & Plan: Visit Diagnoses:  1. Effusion, right knee     Plan: Patient is a 76 year old female who presents for evaluation of right knee pain and swelling.  First awoke with symptoms on Wednesday and has not improved since then.  She has no history of gout.  No signs or symptoms of sepsis.  She does have prior radiographs demonstrating chondrocalcinosis which may be indicative of a history of pseudogout.  Right knee was aspirated of about 20 cc of cloudy blood-tinged fluid which did not appear purulent.  Cortisone injection administered and patient tolerated the procedure well.  This fluid will be sent off for synovial fluid analysis with crystal analysis and anaerobic/aerobic culture with Gram stain.  Plan to call her with the results.  Recommended  she call the office back if she has no relief or symptoms worsen.  Follow-up as needed at this time unless lab tests are concerning but we will discuss them over the phone and how her symptoms have change since this encounter.  Follow-Up Instructions: No follow-ups on file.   Orders:  Orders Placed This Encounter  Procedures   Anaerobic and Aerobic Culture   Synovial Fluid Analysis, Complete   No orders of the defined types were placed in this encounter.     Procedures: No procedures performed   Clinical Data: No additional findings.  Objective: Vital Signs: There were no vitals taken for this visit.  Physical Exam:  Constitutional: Patient appears well-developed HEENT:  Head: Normocephalic Eyes:EOM are normal Neck: Normal range of motion Cardiovascular: Normal rate Pulmonary/chest: Effort normal Neurologic: Patient is alert Skin: Skin is warm Psychiatric: Patient has normal mood and affect  Ortho Exam: Ortho exam demonstrates right knee with moderate effusion.  No significant redness.  Patient has no pain with passive range of motion except for bringing her to full extension.  She is able to perform straight leg raise without difficulty.  No calf tenderness.  Negative Homans' sign.  Medial lateral joint line tenderness moderately.  No pain with hip range of motion.  Very slight warmth over the right knee compared to the left.  Patient does not appear ill or sick.  Specialty Comments:  No specialty comments available.  Imaging: No results found.   PMFS History: Patient Active Problem List   Diagnosis Date Noted   Complete tear of right rotator cuff    Biceps tendonitis on right    S/P rotator cuff repair 02/08/2021   Chest discomfort 01/26/2014   Palpitations 01/26/2014   Cellulitis 09/08/2013   Abdominal pain, left mid abdomen, chronic 02/26/2013   Hyponatremia 10/30/2012   COPD, questioned 01/02/2012   Allergic rhinitis due to pollen 10/20/2010   MITRAL  VALVE PROLAPSE, HX OF 05/26/2009   UNSPECIFIED HEARING LOSS 05/21/2009   RHINOSINUSITIS, 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   Past Medical History:  Diagnosis Date   Allergic rhinitis, cause unspecified    Anemia    Arthritis    Cellulitis    Chest discomfort    COPD, questioned    GAVE (gastric antral vascular ectasia)    Hyponatremia    MITRAL VALVE PROLAPSE, HX OF    Other chronic sinusitis    Palpitations    Syncope    Unspecified hearing loss     Family History  Problem Relation Age of Onset   Other Mother        c difficile gastroenteritis   Lung cancer Father        smoker   Liver disease Brother    Liver cancer Brother    Asthma Other        grandmother   Other Other        bronchitis-grandmother    Past Surgical History:  Procedure Laterality Date   APPENDECTOMY     BACK SURGERY     BICEPT TENODESIS Right 02/08/2021   Procedure: BICEPS TENODESIS;  Surgeon: Meredith Pel, MD;  Location: [place];  Service: Orthopedics;  Laterality: Right;   BLADDER SURGERY     BOWEL RESECTION N/A 11/01/2012   Procedure: SMALL BOWEL RESECTION;  Surgeon: Earnstine Regal, MD;  [facility];  Service: General;  Laterality: N/A;   BREAST ENHANCEMENT SURGERY     revisions   BREAST SURGERY     CHOLECYSTECTOMY     COLON SURGERY     ESOPHAGOGASTRODUODENOSCOPY (EGD) WITH PROPOFOL N/A 10/14/2019   Procedure: ESOPHAGOGASTRODUODENOSCOPY (EGD) WITH PROPOFOL;  Surgeon: Clarene Essex, MD;  [facility];  Service: Endoscopy;  Laterality: N/A;   EYE SURGERY     GI RADIOFREQUENCY ABLATION  10/14/2019   Procedure: GI RADIOFREQUENCY ABLATION;  Surgeon: Clarene Essex, MD;  [facility];  Service: Endoscopy;;   LAPAROSCOPY N/A 11/01/2012   Procedure: LAPAROSCOPY DIAGNOSTIC;  Surgeon: Earnstine Regal, MD;  [facility];  Service: General;  Laterality: N/A;   LAPAROTOMY N/A 11/01/2012   Procedure: EXPLORATORY LAPAROTOMY;  Surgeon: Earnstine Regal, MD;  Location[facility];  Service: General;   Laterality: N/A;   LYSIS OF ADHESION N/A 11/01/2012   Procedure: LYSIS OF ADHESION;  Surgeon: Earnstine Regal, MD;  Location[facility];  Service: General;  Laterality: N/A;   RHINOPLASTY     SHOULDER ARTHROSCOPY WITH OPEN ROTATOR CUFF REPAIR AND DISTAL CLAVICLE ACROMINECTOMY Right 02/08/2021   Procedure: SHOULDER ARTHROSCOPY WITH BICEPS TENDON RELEASE, MINI OPEN ROTATOR CUFF TEAR REPAIR OF THE INFRASPINATUS SUPRASPINATUS AND UPPER PORTIION OF THE SUBSCAP  WITH BICEPS TENODESIS;  Surgeon: Meredith Pel, MD;  Location: [place];  Service: Orthopedics;  Laterality: Right;   TONSILLECTOMY  TOTAL ABDOMINAL HYSTERECTOMY     Social History   Occupational History   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

## 2021-09-05 ENCOUNTER — Telehealth: Payer: Self-pay

## 2021-09-05 NOTE — Telephone Encounter (Signed)
VOB submitted for Euflexxa, right knee BV pending 

## 2021-09-09 LAB — SYNOVIAL FLUID ANALYSIS, COMPLETE
Basophils, %: 0 %
Eosinophils-Synovial: 0 % (ref 0–2)
Lymphocytes-Synovial Fld: 46 % (ref 0–74)
Monocyte/Macrophage: 18 % (ref 0–69)
Neutrophil, Synovial: 36 % — ABNORMAL HIGH (ref 0–24)
Synoviocytes, %: 0 % (ref 0–15)
WBC, Synovial: 4500 cells/uL — ABNORMAL HIGH (ref <150)

## 2021-09-09 LAB — ANAEROBIC AND AEROBIC CULTURE
AER RESULT:: NO GROWTH
MICRO NUMBER:: 12898489
MICRO NUMBER:: 12898490
SPECIMEN QUALITY:: ADEQUATE
SPECIMEN QUALITY:: ADEQUATE

## 2021-09-13 ENCOUNTER — Telehealth: Payer: Self-pay

## 2021-09-13 NOTE — Telephone Encounter (Signed)
Patient stated that she will call back.  Approved for Euflexxa, right knee. [place] Must meet Medicare deductible Secondary insurance, Lynda [place] will cover at 100%. No Co-pay No PA required

## 2021-09-14 ENCOUNTER — Telehealth: Payer: Self-pay | Admitting: Surgical

## 2021-09-14 NOTE — Telephone Encounter (Signed)
Noted! Thank you

## 2021-09-14 NOTE — Telephone Encounter (Signed)
FYI Patient called advised she just had a cortisone injection and will hold off on the gel injection for now. The number to contact patient is [phone]

## 2021-09-15 DIAGNOSIS — M47812 Spondylosis without myelopathy or radiculopathy, cervical region: Secondary | ICD-10-CM | POA: Diagnosis not present

## 2021-09-15 DIAGNOSIS — M7918 Myalgia, other site: Secondary | ICD-10-CM | POA: Diagnosis not present

## 2021-09-15 DIAGNOSIS — R030 Elevated blood-pressure reading, without diagnosis of hypertension: Secondary | ICD-10-CM | POA: Diagnosis not present

## 2021-09-15 DIAGNOSIS — Z681 Body mass index (BMI) 19 or less, adult: Secondary | ICD-10-CM | POA: Diagnosis not present

## 2021-09-16 ENCOUNTER — Other Ambulatory Visit: Payer: Self-pay

## 2021-09-16 ENCOUNTER — Ambulatory Visit
Admission: RE | Admit: 2021-09-16 | Discharge: 2021-09-16 | Disposition: A | Discharge status: Home / Self Care | Payer: Medicare Other | Source: Ambulatory Visit | Attending: Gastroenterology | Admitting: Gastroenterology

## 2021-09-16 DIAGNOSIS — K623 Rectal prolapse: Secondary | ICD-10-CM | POA: Diagnosis not present

## 2021-09-16 DIAGNOSIS — G8929 Other chronic pain: Secondary | ICD-10-CM | POA: Diagnosis not present

## 2021-09-16 DIAGNOSIS — R109 Unspecified abdominal pain: Secondary | ICD-10-CM

## 2021-09-16 DIAGNOSIS — N8189 Other female genital prolapse: Secondary | ICD-10-CM | POA: Diagnosis not present

## 2021-09-16 DIAGNOSIS — N8110 Cystocele, unspecified: Secondary | ICD-10-CM | POA: Diagnosis not present

## 2021-09-16 MED ORDER — IOPAMIDOL (ISOVUE-300) INJECTION 61%
100.0000 mL | Freq: Once | INTRAVENOUS | Status: AC | PRN
Start: 2021-09-16 — End: 2021-09-16
  Administered 2021-09-16: 100 mL via INTRAVENOUS

## 2021-10-04 DIAGNOSIS — R102 Pelvic and perineal pain: Secondary | ICD-10-CM | POA: Diagnosis not present

## 2021-10-04 DIAGNOSIS — N816 Rectocele: Secondary | ICD-10-CM | POA: Diagnosis not present

## 2021-10-04 DIAGNOSIS — A609 Anogenital herpesviral infection, unspecified: Secondary | ICD-10-CM | POA: Diagnosis not present

## 2021-10-04 DIAGNOSIS — N819 Female genital prolapse, unspecified: Secondary | ICD-10-CM | POA: Diagnosis not present

## 2021-10-04 DIAGNOSIS — L905 Scar conditions and fibrosis of skin: Secondary | ICD-10-CM | POA: Diagnosis not present

## 2021-10-25 DIAGNOSIS — N949 Unspecified condition associated with female genital organs and menstrual cycle: Secondary | ICD-10-CM | POA: Diagnosis not present

## 2021-10-25 DIAGNOSIS — K5901 Slow transit constipation: Secondary | ICD-10-CM | POA: Diagnosis not present

## 2021-10-27 DIAGNOSIS — M79641 Pain in right hand: Secondary | ICD-10-CM | POA: Diagnosis not present

## 2021-10-27 DIAGNOSIS — M19041 Primary osteoarthritis, right hand: Secondary | ICD-10-CM | POA: Diagnosis not present

## 2021-11-02 ENCOUNTER — Other Ambulatory Visit: Payer: Self-pay | Admitting: Psychiatry

## 2021-11-02 DIAGNOSIS — F5105 Insomnia due to other mental disorder: Secondary | ICD-10-CM

## 2021-11-11 DIAGNOSIS — M8580 Other specified disorders of bone density and structure, unspecified site: Secondary | ICD-10-CM | POA: Diagnosis not present

## 2021-11-11 DIAGNOSIS — K219 Gastro-esophageal reflux disease without esophagitis: Secondary | ICD-10-CM | POA: Diagnosis not present

## 2021-11-11 DIAGNOSIS — M1990 Unspecified osteoarthritis, unspecified site: Secondary | ICD-10-CM | POA: Diagnosis not present

## 2021-11-14 ENCOUNTER — Ambulatory Visit (INDEPENDENT_AMBULATORY_CARE_PROVIDER_SITE_OTHER): Payer: Medicare Other | Admitting: Orthopedic Surgery

## 2021-11-14 DIAGNOSIS — M1711 Unilateral primary osteoarthritis, right knee: Secondary | ICD-10-CM

## 2021-11-16 ENCOUNTER — Other Ambulatory Visit: Payer: Self-pay

## 2021-11-16 ENCOUNTER — Ambulatory Visit: Payer: Medicare Other | Admitting: Orthopedic Surgery

## 2021-11-16 ENCOUNTER — Emergency Department (HOSPITAL_COMMUNITY): Payer: Medicare Other

## 2021-11-16 ENCOUNTER — Telehealth: Payer: Self-pay

## 2021-11-16 ENCOUNTER — Encounter (HOSPITAL_COMMUNITY): Payer: Self-pay

## 2021-11-16 ENCOUNTER — Inpatient Hospital Stay (HOSPITAL_COMMUNITY)
Admission: EM | Admit: 2021-11-16 | Discharge: 2021-11-18 | DRG: 554 | Disposition: A | Discharge status: Home / Self Care | Payer: Medicare Other | Attending: Family Medicine | Admitting: Family Medicine

## 2021-11-16 DIAGNOSIS — M13861 Other specified arthritis, right knee: Principal | ICD-10-CM | POA: Diagnosis present

## 2021-11-16 DIAGNOSIS — R531 Weakness: Secondary | ICD-10-CM

## 2021-11-16 DIAGNOSIS — M1990 Unspecified osteoarthritis, unspecified site: Secondary | ICD-10-CM | POA: Diagnosis present

## 2021-11-16 DIAGNOSIS — R42 Dizziness and giddiness: Secondary | ICD-10-CM | POA: Diagnosis not present

## 2021-11-16 DIAGNOSIS — Z91030 Bee allergy status: Secondary | ICD-10-CM

## 2021-11-16 DIAGNOSIS — Z79891 Long term (current) use of opiate analgesic: Secondary | ICD-10-CM | POA: Diagnosis not present

## 2021-11-16 DIAGNOSIS — M1711 Unilateral primary osteoarthritis, right knee: Secondary | ICD-10-CM | POA: Diagnosis not present

## 2021-11-16 DIAGNOSIS — M11261 Other chondrocalcinosis, right knee: Secondary | ICD-10-CM | POA: Diagnosis not present

## 2021-11-16 DIAGNOSIS — Z79899 Other long term (current) drug therapy: Secondary | ICD-10-CM

## 2021-11-16 DIAGNOSIS — M25461 Effusion, right knee: Secondary | ICD-10-CM | POA: Diagnosis not present

## 2021-11-16 DIAGNOSIS — E871 Hypo-osmolality and hyponatremia: Secondary | ICD-10-CM | POA: Diagnosis not present

## 2021-11-16 DIAGNOSIS — R4020 Unspecified coma: Secondary | ICD-10-CM | POA: Diagnosis not present

## 2021-11-16 DIAGNOSIS — Z79890 Hormone replacement therapy (postmenopausal): Secondary | ICD-10-CM | POA: Diagnosis not present

## 2021-11-16 DIAGNOSIS — K31819 Angiodysplasia of stomach and duodenum without bleeding: Secondary | ICD-10-CM

## 2021-11-16 DIAGNOSIS — Z888 Allergy status to other drugs, medicaments and biological substances status: Secondary | ICD-10-CM | POA: Diagnosis not present

## 2021-11-16 DIAGNOSIS — I10 Essential (primary) hypertension: Secondary | ICD-10-CM | POA: Diagnosis not present

## 2021-11-16 DIAGNOSIS — M25561 Pain in right knee: Secondary | ICD-10-CM | POA: Diagnosis not present

## 2021-11-16 DIAGNOSIS — R55 Syncope and collapse: Secondary | ICD-10-CM | POA: Diagnosis not present

## 2021-11-16 DIAGNOSIS — G4489 Other headache syndrome: Secondary | ICD-10-CM | POA: Diagnosis not present

## 2021-11-16 DIAGNOSIS — Z87891 Personal history of nicotine dependence: Secondary | ICD-10-CM | POA: Diagnosis not present

## 2021-11-16 DIAGNOSIS — I959 Hypotension, unspecified: Secondary | ICD-10-CM | POA: Diagnosis not present

## 2021-11-16 DIAGNOSIS — F411 Generalized anxiety disorder: Secondary | ICD-10-CM | POA: Diagnosis not present

## 2021-11-16 LAB — BASIC METABOLIC PANEL
Anion gap: 6 (ref 5–15)
BUN: 20 mg/dL (ref 8–23)
CO2: 24 mmol/L (ref 22–32)
Calcium: 8.6 mg/dL — ABNORMAL LOW (ref 8.9–10.3)
Chloride: 101 mmol/L (ref 98–111)
Creatinine, Ser: 0.32 mg/dL — ABNORMAL LOW (ref 0.44–1.00)
GFR, Estimated: >60 mL/min (ref >60)
Glucose, Bld: 119 mg/dL — ABNORMAL HIGH (ref 70–99)
Potassium: 3.9 mmol/L (ref 3.5–5.1)
Sodium: 131 mmol/L — ABNORMAL LOW (ref 135–145)

## 2021-11-16 LAB — SYNOVIAL CELL COUNT + DIFF, W/ CRYSTALS
Monocyte-Macrophage-Synovial Fluid: 20 % — ABNORMAL LOW (ref 50–90)
Neutrophil, Synovial: 80 % — ABNORMAL HIGH (ref 0–25)
WBC, Synovial: 29070 /mm3 — ABNORMAL HIGH (ref 0–200)

## 2021-11-16 LAB — MAGNESIUM: Magnesium: 1.8 mg/dL (ref 1.7–2.4)

## 2021-11-16 LAB — CBC WITH DIFFERENTIAL/PLATELET
Abs Immature Granulocytes: 0.07 10*3/uL (ref 0.00–0.07)
Basophils Absolute: 0 10*3/uL (ref 0.0–0.1)
Basophils Relative: 0 %
Eosinophils Absolute: 0 10*3/uL (ref 0.0–0.5)
Eosinophils Relative: 0 %
HCT: 37.6 % (ref 36.0–46.0)
Hemoglobin: 12.5 g/dL (ref 12.0–15.0)
Immature Granulocytes: 1 %
Lymphocytes Relative: 8 %
Lymphs Abs: 1 10*3/uL (ref 0.7–4.0)
MCH: 29 pg (ref 26.0–34.0)
MCHC: 33.2 g/dL (ref 30.0–36.0)
MCV: 87.2 fL (ref 80.0–100.0)
Monocytes Absolute: 1.1 10*3/uL — ABNORMAL HIGH (ref 0.1–1.0)
Monocytes Relative: 9 %
Neutro Abs: 10.7 10*3/uL — ABNORMAL HIGH (ref 1.7–7.7)
Neutrophils Relative %: 82 %
Platelets: 236 10*3/uL (ref 150–400)
RBC: 4.31 MIL/uL (ref 3.87–5.11)
RDW: 13 % (ref 11.5–15.5)
WBC: 12.9 10*3/uL — ABNORMAL HIGH (ref 4.0–10.5)
nRBC: 0 % (ref 0.0–0.2)

## 2021-11-16 LAB — C-REACTIVE PROTEIN: CRP: 7 mg/dL — ABNORMAL HIGH (ref <1.0)

## 2021-11-16 LAB — CBG MONITORING, ED: Glucose-Capillary: 112 mg/dL — ABNORMAL HIGH (ref 70–99)

## 2021-11-16 LAB — TSH: TSH: 3.627 u[IU]/mL (ref 0.350–4.500)

## 2021-11-16 LAB — SEDIMENTATION RATE: Sed Rate: 28 mm/hr — ABNORMAL HIGH (ref 0–22)

## 2021-11-16 LAB — LACTIC ACID, PLASMA: Lactic Acid, Venous: 1 mmol/L (ref 0.5–1.9)

## 2021-11-16 MED ORDER — TRAZODONE HCL 50 MG PO TABS: 50.0000 mg | ORAL_TABLET | Freq: Every evening | ORAL | Status: DC | PRN

## 2021-11-16 MED ORDER — KETOROLAC TROMETHAMINE 15 MG/ML IJ SOLN
15.0000 mg | Freq: Four times a day (QID) | INTRAMUSCULAR | Status: DC | PRN
  Administered 2021-11-16 – 2021-11-17 (×2): 15 mg via INTRAVENOUS
  Filled 2021-11-16 (×2): qty 1

## 2021-11-16 MED ORDER — ACETAMINOPHEN 650 MG RE SUPP: 650.0000 mg | Freq: Four times a day (QID) | RECTAL | Status: DC | PRN

## 2021-11-16 MED ORDER — ONDANSETRON HCL 4 MG/2ML IJ SOLN
4.0000 mg | Freq: Four times a day (QID) | INTRAMUSCULAR | Status: DC | PRN
Start: 2021-11-16 — End: 2021-11-18

## 2021-11-16 MED ORDER — LIDOCAINE HCL (PF) 1 % IJ SOLN
30.0000 mL | Freq: Once | INTRAMUSCULAR | Status: AC
  Administered 2021-11-16: 30 mL
  Filled 2021-11-16: qty 30

## 2021-11-16 MED ORDER — MORPHINE SULFATE (PF) 4 MG/ML IV SOLN
4.0000 mg | Freq: Once | INTRAVENOUS | Status: AC
  Administered 2021-11-16: 4 mg via INTRAVENOUS
  Filled 2021-11-16: qty 1

## 2021-11-16 MED ORDER — MORPHINE SULFATE (PF) 4 MG/ML IV SOLN
4.0000 mg | INTRAVENOUS | Status: DC | PRN
  Administered 2021-11-17: 4 mg via INTRAVENOUS
  Filled 2021-11-16 (×2): qty 1

## 2021-11-16 MED ORDER — ENOXAPARIN SODIUM 30 MG/0.3ML IJ SOSY
30.0000 mg | PREFILLED_SYRINGE | INTRAMUSCULAR | Status: DC
  Administered 2021-11-17: 30 mg via SUBCUTANEOUS
  Filled 2021-11-16: qty 0.3

## 2021-11-16 MED ORDER — LACTATED RINGERS IV SOLN: INTRAVENOUS | Status: AC

## 2021-11-16 MED ORDER — FENTANYL CITRATE PF 50 MCG/ML IJ SOSY
50.0000 ug | PREFILLED_SYRINGE | Freq: Once | INTRAMUSCULAR | Status: AC
  Administered 2021-11-16: 50 ug via INTRAVENOUS
  Filled 2021-11-16: qty 1

## 2021-11-16 MED ORDER — METOPROLOL SUCCINATE ER 25 MG PO TB24
12.5000 mg | ORAL_TABLET | Freq: Every day | ORAL | Status: DC
  Filled 2021-11-16: qty 1

## 2021-11-16 MED ORDER — ACETAMINOPHEN 325 MG PO TABS
650.0000 mg | ORAL_TABLET | Freq: Four times a day (QID) | ORAL | Status: DC | PRN
  Filled 2021-11-16: qty 2

## 2021-11-16 MED ORDER — LORAZEPAM 0.5 MG PO TABS: 0.2500 mg | ORAL_TABLET | Freq: Two times a day (BID) | ORAL | Status: DC | PRN

## 2021-11-16 MED ORDER — ONDANSETRON HCL 4 MG/2ML IJ SOLN
4.0000 mg | Freq: Once | INTRAMUSCULAR | Status: AC
  Administered 2021-11-16: 4 mg via INTRAVENOUS
  Filled 2021-11-16: qty 2

## 2021-11-16 MED ORDER — NALOXONE HCL 0.4 MG/ML IJ SOLN: 0.4000 mg | INTRAMUSCULAR | Status: DC | PRN

## 2021-11-16 MED ORDER — HYDROMORPHONE HCL 1 MG/ML IJ SOLN
0.5000 mg | Freq: Once | INTRAMUSCULAR | Status: AC
  Administered 2021-11-16: 0.5 mg via INTRAVENOUS
  Filled 2021-11-16: qty 1

## 2021-11-16 MED ORDER — POLYSACCHARIDE IRON COMPLEX 150 MG PO CAPS
150.0000 mg | ORAL_CAPSULE | Freq: Every day | ORAL | Status: DC
  Administered 2021-11-17 – 2021-11-18 (×2): 150 mg via ORAL
  Filled 2021-11-16 (×2): qty 1

## 2021-11-16 NOTE — H&P (Signed)
?History and Physical  ? ? ?PLEASE NOTE THAT DRAGON DICTATION SOFTWARE WAS USED IN THE CONSTRUCTION OF THIS NOTE. ? ? ?Haley Wade CBU:[id number] DOB: 07-29-1946 DOA: 11/16/2021 ? ?PCP: Shon Baton, MD  ?Patient coming from: home  ? ?I have personally briefly reviewed patient's old medical records in [place] ? ?Chief Complaint: Right knee pain ? ?HPI: Haley Wade is a 76 y.o. female with medical history significant for osteoarthritis of the right knee, chronic hyponatremia associated baseline serum sodium range of 1 28-1 34, generalized anxiety disorder, essential hypertension who is admitted to [facility] on 11/16/2021 with inflammatory monoarthropathy of the right knee after presenting from home to [facility] ED complaining of right knee pain.  ? ?In the setting of a known history of osteoarthritis involving the right knee, the patient reports that she underwent right knee gel injection via Ortho care 2 days ago.  Over the ensuing 2 days following this injection, she reports progressive of right knee discomfort associated with gross inflammation relative to the left counterpart as well as development erythema, increased warmth to touch.  ? ?Denies any other joint involvement, denies rash/erythema in any other location.  Otherwise, no known preceding trauma to the right knee.  Has never previously experienced symptoms similar to the above.  Confirms the right knee is a native joint, and has not noted any associated drainage from this joint.  Denies any associated subjective fever i and no recent chills, full body rigors, or generalized myalgias.  also denies a known history of gout/pseudogout.  Not on any blood thinners including aspirin.  No recent steroid use. ?  ?Denies any recent chest pain, shortness of breath, palpitations, diaphoresis.  He also denies any orthopnea, PND. ? ? ? ? ?ED Course:  ?Vital signs in the ED were notable for the following: Temperature max 97.8; heart rate 79-88;  blood pressure 116/94; respiratory rate 17-23, oxygen saturation 95 to 100% on room air. ? ?Labs were notable for the following: BMP notable for the following: Sodium 131 compared to most recent prior value of 133 in June 2022, bicarbonate 24, BUN 20, creatinine 0.32, BUN/creatinine ratio greater than 8 CRP 7.0.  ESR 28 CBC notable for episode count 12,900 with 82% neutrophils, hemoglobin 12.5 compared to most recent prior value of 13.1 in June 2022. ? ?Will in the ED today, she underwent right knee arthrocentesis with ensuing synovial fluid analysis notable for amber, turbid appearing specimen that demonstrated the presence of calcium pyrophosphate crystals, with cell count/differential notable for 29,000 white blood cells with 80% neutrophils Gram stain culture showing no growth and associated glucose result pending. ? ?Imaging and additional notable ED work-up: Plain films of the right knee showed no evidence of acute fracture or dislocation, as well as small to moderate effusion in the suprapatellar bursa. ? ?EDP discussed patient's case with on call Ortho care provider, Dr. Tempie Donning, he felt that presentation and synovial fluid analysis was more consistent with pseudogout relative to septic arthritis.  He recommended overnight observation to the hospitalist service, with continued monitoring for results of his culture while refraining from interval antibiotics, and conveys that Ortho care would evaluate the patient in the hospital in the morning. ? ?While in the ED, the following were administered: Morphine 4 mg IV x1, fentanyl 50 mcg IV x1 ? ?Subsequently, the patient was admitted for overnight observation for further evaluation presenting arthritis of the right knee. ? ? ?Review of Systems: As per HPI otherwise 10 point  review of systems negative.  ? ?Past Medical History:  ?Diagnosis Date  ? Allergic rhinitis, cause unspecified   ? Anemia   ? Arthritis   ? Cellulitis   ? Chest discomfort   ? COPD, questioned    ? GAVE (gastric antral vascular ectasia)   ? Hyponatremia   ? MITRAL VALVE PROLAPSE, HX OF   ? Other chronic sinusitis   ? Palpitations   ? Syncope   ? Unspecified hearing loss   ? ? ?Past Surgical History:  ?Procedure Laterality Date  ? APPENDECTOMY    ? BACK SURGERY    ? BICEPT TENODESIS Right 02/08/2021  ? Procedure: BICEPS TENODESIS;  Surgeon: Meredith Pel, MD;  Location: [facility];  Service: Orthopedics;  Laterality: Right;  ? BLADDER SURGERY    ? BOWEL RESECTION N/A 11/01/2012  ? Procedure: SMALL BOWEL RESECTION;  Surgeon: Earnstine Regal, MD;  Location: [facility];  Service: General;  Laterality: N/A;  ? BREAST ENHANCEMENT SURGERY    ? revisions  ? BREAST SURGERY    ? CHOLECYSTECTOMY    ? COLON SURGERY    ? ESOPHAGOGASTRODUODENOSCOPY (EGD) WITH PROPOFOL N/A 10/14/2019  ? Procedure: ESOPHAGOGASTRODUODENOSCOPY (EGD) WITH PROPOFOL;  Surgeon: Clarene Essex, MD;  Location: [facility];  Service: Endoscopy;  Laterality: N/A;  ? EYE SURGERY    ? GI RADIOFREQUENCY ABLATION  10/14/2019  ? Procedure: GI RADIOFREQUENCY ABLATION;  Surgeon: Clarene Essex, MD;  Location: [facility];  Service: Endoscopy;;  ? LAPAROSCOPY N/A 11/01/2012  ? Procedure: LAPAROSCOPY DIAGNOSTIC;  Surgeon: Earnstine Regal, MD;  Location: [facility];  Service: General;  Laterality: N/A;  ? LAPAROTOMY N/A 11/01/2012  ? Procedure: EXPLORATORY LAPAROTOMY;  Surgeon: Earnstine Regal, MD;  Location: [facility];  Service: General;  Laterality: N/A;  ? LYSIS OF ADHESION N/A 11/01/2012  ? Procedure: LYSIS OF ADHESION;  Surgeon: Earnstine Regal, MD;  Location: [facility];  Service: General;  Laterality: N/A;  ? RHINOPLASTY    ? SHOULDER ARTHROSCOPY WITH OPEN ROTATOR CUFF REPAIR AND DISTAL CLAVICLE ACROMINECTOMY Right 02/08/2021  ? Procedure: SHOULDER ARTHROSCOPY WITH BICEPS TENDON RELEASE, MINI OPEN ROTATOR CUFF TEAR REPAIR OF THE INFRASPINATUS SUPRASPINATUS AND UPPER PORTIION OF THE SUBSCAP  WITH BICEPS TENODESIS;  Surgeon: Meredith Pel, MD;  Location: [place];  Service:  Orthopedics;  Laterality: Right;  ? TONSILLECTOMY    ? TOTAL ABDOMINAL HYSTERECTOMY    ? ? ?Social History: ? reports that she quit smoking about 41 years ago. Her smoking use included cigarettes. She has never used smokeless tobacco. She reports that she does not drink alcohol and does not use drugs. ? ? ?Allergies  ?Allergen Reactions  ? Plaquenil [Hydroxychloroquine Sulfate] Rash and Other (See Comments)  ?  "Covered all over in a rash and welts"  ? Bee Venom Swelling and Other (See Comments)  ?  Swelling at site where stung  ? Terconazole Nausea Only and Other (See Comments)  ?  Headaches, also  ? Plaquenil [Hydroxychloroquine] Rash  ? ? ?Family History  ?Problem Relation Age of Onset  ? Other Mother   ?     c difficile gastroenteritis  ? Lung cancer Father   ?     smoker  ? Liver disease Brother   ? Liver cancer Brother   ? Asthma Other   ?     grandmother  ? Other Other   ?     bronchitis-grandmother  ? ? ?Family history reviewed and not pertinent  ? ? ?Prior  to Admission medications   ?Medication Sig Start Date End Date Taking? Authorizing Provider  ?acyclovir (ZOVIRAX) 200 MG capsule Take 200 mg by mouth daily.    [provider]  ?cyclobenzaprine (FLEXERIL) 5 MG tablet Take 2 tablets (10 mg total) by mouth 3 (three) times daily as needed for muscle spasms. 03/17/21   Meredith Pel, MD  ?estrogens, conjugated, (PREMARIN) 0.625 MG tablet Take 0.625 mg by mouth daily.     [provider]  ?FERREX 150 150 MG capsule Take 150 mg by mouth daily with breakfast.    [provider]  ?finasteride (PROSCAR) 5 MG tablet Take 5 mg by mouth daily.    [provider]  ?fluticasone (FLONASE) 50 MCG/ACT nasal spray Place 2 sprays into both nostrils daily as needed for allergies or rhinitis.    [provider]  ?HYDROcodone-acetaminophen (NORCO/VICODIN) 5-325 MG tablet Take 1 tablet by mouth every 6 (six) hours as needed for moderate pain. 02/12/21   Meredith Pel, MD   ?hydrocodone-ibuprofen (VICOPROFEN) 5-200 MG tablet 1 po q 4-6hrs prn pain 01/14/21   Meredith Pel, MD  ?ibuprofen (ADVIL) 800 MG tablet Take 200-800 mg by mouth every 8 (eight) hours as needed (for pain)

## 2021-11-16 NOTE — ED Provider Notes (Signed)
?Care of patient assumed from [place] at 1530.  Agree with history, physical exam and plan.  See their note for further details. Briefly, 76 year old female presents to the emergency department with a chief complaint of right knee pain.  Patient reports that she had injection to her knee approximately 2 days prior by Dr. Marlou Sa at [facility].  Patient reports that she had increased swelling and pain the next day.  For the last 48 hours patient has had severe pain in her right knee as well as increased warmth, erythema, and swelling. ? ?Physical Exam  ?BP 123/70   Pulse 86   Temp (!) 97.5 ?F (36.4 ?C) (Oral)   Resp (!) 23   Wt 43.5 kg   SpO2 99%   BMI 19.07 kg/m?  ? ?Physical Exam ?Vitals and nursing note reviewed.  ?Constitutional:   ?   General: She is not in acute distress. ?   Appearance: She is not ill-appearing, toxic-appearing or diaphoretic.  ?HENT:  ?   Head: Normocephalic.  ?Eyes:  ?   General: No scleral icterus.    ?   Right eye: No discharge.     ?   Left eye: No discharge.  ?Cardiovascular:  ?   Rate and Rhythm: Normal rate.  ?   Pulses:     ?     Dorsalis pedis pulses are 2+ on the right side and 2+ on the left side.  ?Pulmonary:  ?   Effort: Pulmonary effort is normal.  ?Musculoskeletal:  ?   Right lower leg: Normal.  ?   Left lower leg: Normal.  ?   Right ankle: No swelling, deformity, ecchymosis or lacerations. No tenderness. Normal range of motion.  ?   Left ankle: No swelling, deformity, ecchymosis or lacerations. No tenderness. Normal range of motion.  ?   Right foot: Normal range of motion and normal capillary refill. No swelling, deformity, laceration, tenderness, bony tenderness or crepitus. Normal pulse.  ?   Left foot: Normal range of motion and normal capillary refill. No swelling, deformity, laceration, tenderness, bony tenderness or crepitus. Normal pulse.  ?   Comments: Patient has right knee with marked effusion, joint is hot to the touch.  No significant erythema.  Patient  has diffuse tenderness to light touch of right knee.  Decreased knee flexion.  ?Skin: ?   General: Skin is warm and dry.  ?Neurological:  ?   General: No focal deficit present.  ?   Mental Status: She is alert.  ?Psychiatric:     ?   Behavior: Behavior is cooperative.  ? ? ? ? ?Procedures  ?Procedures ? ?ED Course / MDM  ? ?Clinical Course as of 11/17/21 0201  ?Wed Nov 16, 2021  ?1937 I spoke to Dr. Tempie Donning with Ortho care who advised to have patient admitted and cultures followed.  Ortho team will reevaluate tomorrow morning.  He suspects that patient has pseudogout and advised to hold any antibiotics at this time. [PB]  ?53 I spoke to Dr. Nadara Mustard who will see the patient for admission. [PB]  ?  ?Clinical Course User Index ?[PB] Loni Beckwith, PA-C  ? ?Medical Decision Making ?Amount and/or Complexity of Data Reviewed ?Labs: ordered. ?Radiology: ordered. ? ?Risk ?Prescription drug management. ?Decision regarding hospitalization. ? ? ?Arthrocentesis was performed by previous provider.  Results from arthrocentesis are still pending at this time.  Patient will need consult to orthopedic provider pending lab results. ? ?I personally viewed and interpreted patient's lab  results.  Pertinent findings include: ?-Sed rate 28 ?-CRP 7.0 ?-Cell count and differential from arthrocentesis shows 29,070, neutrophil 80, 20, calcium pyrophosphate crystals present ? ?I spoke with Dr. Tempie Donning please see above note for further information on this conversation. ? ?I spoke to Dr. Nadara Mustard who will see the patient for admission. ? ? ? ? ?  ?Loni Beckwith, PA-C ?11/17/21 0203 ? ?  ?Charlesetta Shanks, MD ?11/24/21 [phone] ? ?

## 2021-11-16 NOTE — Telephone Encounter (Signed)
Patient called into the office stating that she has an extreme headache and she just threw up and feels as if she is going to pass out. She stated that she would like to come in sooner due to the fact she can't wait till 100. She stated if she can't come in sooner she would go to the ER. I explained to the patient that we can not get her in any sooner than 100 and if she felt that she needed to go to the ER then she could.  ?

## 2021-11-16 NOTE — ED Provider Notes (Signed)
?[facility] DEPT ?Provider Note ? ? ?CSN: [id number] ?Arrival date & time: 11/16/21  1224 ? ?  ? ?History ? ?Chief Complaint  ?Patient presents with  ? Knee Pain  ? ? ?BAO COREAS is a 76 y.o. female who presents emergency department chief complaint of right knee pain.  Patient had a "gel" injection in her right knee 2 days ago with Dr. Marlou Sa at [facility].  Patient reports that the next day she had increased pain and swelling.  Over the past 48 hours she has had severe pain in the knee, increased heat redness and swelling in the joint and came in for evaluation after she states he tried to get in at the office.  She has unbearable pain with movement of the joint and is having severe difficulty ambulating.  She took a Vicodin last night and this morning and she states she never takes that medication however it did not seem to help her pain.  She has never had a reaction like this before in her knee.  She denies fevers or chills. ? ?Knee Pain ? ?  ? ?Home Medications ?Prior to Admission medications   ?Medication Sig Start Date End Date Taking? Authorizing Provider  ?acyclovir (ZOVIRAX) 200 MG capsule Take 200 mg by mouth daily.    [provider]  ?cyclobenzaprine (FLEXERIL) 5 MG tablet Take 2 tablets (10 mg total) by mouth 3 (three) times daily as needed for muscle spasms. 03/17/21   Meredith Pel, MD  ?estrogens, conjugated, (PREMARIN) 0.625 MG tablet Take 0.625 mg by mouth daily.     [provider]  ?FERREX 150 150 MG capsule Take 150 mg by mouth daily with breakfast.    [provider]  ?finasteride (PROSCAR) 5 MG tablet Take 5 mg by mouth daily.    [provider]  ?fluticasone (FLONASE) 50 MCG/ACT nasal spray Place 2 sprays into both nostrils daily as needed for allergies or rhinitis.    [provider]  ?HYDROcodone-acetaminophen (NORCO/VICODIN) 5-325 MG tablet Take 1 tablet by mouth every 6 (six) hours as needed for  moderate pain. 02/12/21   Meredith Pel, MD  ?hydrocodone-ibuprofen (VICOPROFEN) 5-200 MG tablet 1 po q 4-6hrs prn pain 01/14/21   Meredith Pel, MD  ?ibuprofen (ADVIL) 800 MG tablet Take 200-800 mg by mouth every 8 (eight) hours as needed (for pain). 12/17/20   [provider]  ?LORazepam (ATIVAN) 0.5 MG tablet Take 0.5 tablets (0.25 mg total) by mouth in the morning and at bedtime. ?Patient taking differently: Take 0.25 mg by mouth 2 (two) times daily as needed for anxiety. 10/18/20   Cottle, Billey Co., MD  ?Methen-Bella-Meth Bl-Phen Sal (URISED PO) Take 1 tablet by mouth daily as needed (for bladder spasms).    [provider]  ?methocarbamol (ROBAXIN) 500 MG tablet Take 1 tablet (500 mg total) by mouth every 8 (eight) hours as needed for muscle spasms. 01/14/21   Meredith Pel, MD  ?metoprolol succinate (TOPROL XL) 25 MG 24 hr tablet Take 0.5 tablets (12.5 mg total) by mouth daily. 11/01/20   Belva Crome, MD  ?metroNIDAZOLE (METROGEL) 0.75 % vaginal gel Place 1 Applicatorful vaginally as needed (AS DIRECTED- for yeast).    [provider]  ?omeprazole (PRILOSEC) 20 MG capsule Take 20 mg by mouth daily as needed (for reflux).    [provider]  ?Polyethyl Glycol-Propyl Glycol (SYSTANE ULTRA) 0.4-0.3 % SOLN Place 1 drop into both eyes 3 (three)  times daily.    [provider]  ?spironolactone (ALDACTONE) 50 MG tablet Take 1 tablet (50 mg total) by mouth daily. ?Patient taking differently: Take 50 mg by mouth 3 (three) times daily. 09/09/13   Shon Baton, MD  ?traZODone (DESYREL) 50 MG tablet Take 1 tablet (50 mg total) by mouth at bedtime as needed for sleep. 07/04/21   Cottle, Billey Co., MD  ?triamcinolone cream (KENALOG) 0.1 % Apply 1 application topically 2 (two) times daily as needed (for itching).    [provider]  ?Vitamin D, Ergocalciferol, (DRISDOL) 1.25 MG (50000 UNIT) CAPS capsule Take 50,000 Units by mouth every Saturday.    [provider]  ?zaleplon (SONATA) 10 MG capsule TAKE ONE CAPSULE AT BEDTIME AS NEEDED FOR SLEEP 11/02/21   Cottle, Billey Co., MD  ?   ? ?Allergies    ?Plaquenil [hydroxychloroquine sulfate], Bee venom, and Terconazole   ? ?Review of Systems   ?Review of Systems  ?Musculoskeletal:  Positive for gait problem and joint swelling.  ? ?Physical Exam ?Updated Vital Signs ?BP 132/77   Pulse 90   Temp (!) 97.5 ?F (36.4 ?C) (Oral)   Resp 16   Wt 43.5 kg   SpO2 100%   BMI 19.07 kg/m?  ?Physical Exam ?Vitals and nursing note reviewed.  ?Constitutional:   ?   General: She is not in acute distress. ?   Appearance: She is well-developed. She is not diaphoretic.  ?HENT:  ?   Head: Normocephalic and atraumatic.  ?   Right Ear: External ear normal.  ?   Left Ear: External ear normal.  ?   Nose: Nose normal.  ?   Mouth/Throat:  ?   Mouth: Mucous membranes are moist.  ?Eyes:  ?   General: No scleral icterus. ?   Conjunctiva/sclera: Conjunctivae normal.  ?Cardiovascular:  ?   Rate and Rhythm: Normal rate and regular rhythm.  ?   Heart sounds: Normal heart sounds. No murmur heard. ?  No friction rub. No gallop.  ?Pulmonary:  ?   Effort: Pulmonary effort is normal. No respiratory distress.  ?   Breath sounds: Normal breath sounds.  ?Abdominal:  ?   General: Bowel sounds are normal. There is no distension.  ?   Palpations: Abdomen is soft. There is no mass.  ?   Tenderness: There is no abdominal tenderness. There is no guarding.  ?Musculoskeletal:  ?   Cervical back: Normal range of motion.  ?   Comments: Right and left knee examination performed.  Right knee with marked effusion, joint is hot to the touch, no significant erythema externally.  Severe tenderness to palpation of the knee.  Patient is unable to range the joint.  ?Skin: ?   General: Skin is warm and dry.  ?Neurological:  ?   Mental Status: She is alert and oriented to person, place, and time.  ?Psychiatric:     ?   Behavior: Behavior normal.  ? ? ?ED Results /  Procedures / Treatments   ?Labs ?(all labs ordered are listed, but only abnormal results are displayed) ?Labs Reviewed  ?CBC WITH DIFFERENTIAL/PLATELET - Abnormal; Notable for the following components:  ?    Result Value  ? WBC 12.9 (*)   ? Neutro Abs 10.7 (*)   ? Monocytes Absolute 1.1 (*)   ? All other components within normal limits  ?BASIC METABOLIC PANEL - Abnormal; Notable for the following components:  ? Sodium 131 (*)   ?  Glucose, Bld 119 (*)   ? Creatinine, Ser 0.32 (*)   ? Calcium 8.6 (*)   ? All other components within normal limits  ?CBG MONITORING, ED - Abnormal; Notable for the following components:  ? Glucose-Capillary 112 (*)   ? All other components within normal limits  ?BODY FLUID CULTURE W GRAM STAIN  ?GRAM STAIN  ?SEDIMENTATION RATE  ?C-REACTIVE PROTEIN  ?SYNOVIAL CELL COUNT + DIFF, W/ CRYSTALS  ?GLUCOSE, BODY FLUID OTHER            ? ? ?EKG ?None ? ?Radiology ?DG Knee 2 Views Right ? ?Result Date: 11/16/2021 ?CLINICAL DATA:  Pain right knee EXAM: RIGHT KNEE - 1-2 VIEW COMPARISON:  04/13/2021 FINDINGS: No recent fracture or dislocation is seen in the right knee. Meniscal cartilage calcifications are seen in the medial and lateral compartments. There are small linear calcific densities posterior to the distal femur and in the region of suprapatellar bursa. There is small to moderate effusion in the suprapatellar bursa. IMPRESSION: No recent fracture or dislocation is seen in the right knee. Small to moderate effusion is present in the suprapatellar bursa. Chondrocalcinosis suggesting degenerative arthritis. There are small calcific densities in the popliteal fossa and suprapatellar bursa suggesting possible loose bodies or soft tissue calcifications from chronic inflammation. Electronically Signed   By: Elmer Picker M.D.   On: 11/16/2021 13:56   ? ?Procedures ?.Joint Aspiration/Arthrocentesis ? ?Date/Time: 11/16/2021 2:56 PM ?Performed by: Margarita Mail, PA-C ?Authorized by: Margarita Mail,  PA-C  ? ?Consent:  ?  Consent obtained:  Verbal ?  Consent given by:  Patient ?  Risks discussed:  Bleeding, infection, pain and incomplete drainage ?  Alternatives discussed:  No treatment ?Universal protocol:  ?  Pati

## 2021-11-16 NOTE — ED Triage Notes (Signed)
Patient brought in via ems from home. Patient had gel placed in right knee on Monday with pain progressively gotten worse with swelling. Is not able to walk on it at this time  ?

## 2021-11-16 NOTE — ED Notes (Signed)
ED TO INPATIENT HANDOFF REPORT ? ?ED Nurse Name and Phone #: [phone] Erick Colace, RN  ? ?S ?Name/Age/Gender ?Haley Wade ?76 y.o. ?female ?Room/Bed: WA15/WA15 ? ?Code Status ?  Code Status: Full Code ? ?Home/SNF/Other ?Home ?Patient oriented to: self, place, time, and situation ?Is this baseline? Yes  ? 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 ?Triage Note ?Patient brought in via ems from home. Patient had gel placed in right knee on Monday with pain progressively gotten worse with swelling. Is not able to walk on it at this time   ? ?Allergies ?Allergies  ?Allergen Reactions  ? Plaquenil [Hydroxychloroquine Sulfate] Rash and Other (See Comments)  ?  "Covered all over in a rash and welts"  ? Bee Venom Swelling and Other (See Comments)  ?  Swelling at site where stung  ? Terconazole Nausea Only and Other (See Comments)  ?  Headaches, also  ? Plaquenil [Hydroxychloroquine] Rash  ? ? ?Level of Care/Admitting Diagnosis ?ED Disposition   ? ? ED Disposition  ?Admit  ? Condition  ?--  ? Comment  ?Hospital Area: [facility] [[id number]] ? Level of Care: Telemetry [5] ? Admit to tele based on following criteria: Monitor for Ischemic changes ? May place patient in observation at [facility] or [employer] if equivalent level of care is available:: No ? Covid Evaluation: Asymptomatic - no recent exposure (last 10 days) testing not required ? Diagnosis: Inflammatory arthritis [494496] ? Admitting Physician: Rhetta Mura [[id number]] ? Attending Physician: Rhetta Mura [[id number]] ?  ?  ? ?  ? ? ?B ?Medical/Surgery History ?Past Medical History:  ?Diagnosis Date  ? Allergic rhinitis, cause unspecified   ? Anemia   ? Arthritis   ? Cellulitis   ? Chest discomfort   ? COPD, questioned   ? GAVE (gastric antral vascular ectasia)   ? Hyponatremia   ? MITRAL VALVE PROLAPSE, HX OF   ? Other chronic sinusitis   ? Palpitations   ? Syncope   ? Unspecified hearing  loss   ? ?Past Surgical History:  ?Procedure Laterality Date  ? APPENDECTOMY    ? BACK SURGERY    ? BICEPT TENODESIS Right 02/08/2021  ? Procedure: BICEPS TENODESIS;  Surgeon: Meredith Pel, MD;  Location: [place];  Service: Orthopedics;  Laterality: Right;  ? BLADDER SURGERY    ? BOWEL RESECTION N/A 11/01/2012  ? Procedure: SMALL BOWEL RESECTION;  Surgeon: Earnstine Regal, MD;  Location: [facility];  Service: General;  Laterality: N/A;  ? BREAST ENHANCEMENT SURGERY    ? revisions  ? BREAST SURGERY    ? CHOLECYSTECTOMY    ? COLON SURGERY    ? ESOPHAGOGASTRODUODENOSCOPY (EGD) WITH PROPOFOL N/A 10/14/2019  ? Procedure: ESOPHAGOGASTRODUODENOSCOPY (EGD) WITH PROPOFOL;  Surgeon: Clarene Essex, MD;  [facility];  Service: Endoscopy;  Laterality: N/A;  ? EYE SURGERY    ? GI RADIOFREQUENCY ABLATION  10/14/2019  ? Procedure: GI RADIOFREQUENCY ABLATION;  Surgeon: Clarene Essex, MD;  [facility];  Service: Endoscopy;;  ? LAPAROSCOPY N/A 11/01/2012  ? Procedure: LAPAROSCOPY DIAGNOSTIC;  Surgeon: Earnstine Regal, MD;  Location[facility];  Service: General;  Laterality: N/A;  ? LAPAROTOMY N/A 11/01/2012  ? Procedure: EXPLORATORY LAPAROTOMY;  Surgeon: Earnstine Regal, MD;  Location[facility];  Service: General;  Laterality: N/A;  ? LYSIS OF ADHESION N/A 11/01/2012  ? Procedure: LYSIS OF ADHESION;  Surgeon: Earnstine Regal, MD;  Location: [facility];  Service: General;  Laterality: N/A;  ? RHINOPLASTY    ? SHOULDER ARTHROSCOPY WITH OPEN ROTATOR CUFF REPAIR AND DISTAL CLAVICLE ACROMINECTOMY Right 02/08/2021  ? Procedure: SHOULDER ARTHROSCOPY WITH BICEPS TENDON RELEASE, MINI OPEN ROTATOR CUFF TEAR REPAIR OF THE INFRASPINATUS SUPRASPINATUS AND UPPER PORTIION OF THE SUBSCAP  WITH BICEPS TENODESIS;  Surgeon: Meredith Pel, MD;  Location: [place];  Service: Orthopedics;  Laterality: Right;  ? TONSILLECTOMY    ? TOTAL ABDOMINAL HYSTERECTOMY    ?  ? ?A ?IV Location/Drains/Wounds ?Patient Lines/Drains/Airways Status   ? ? Active  Line/Drains/Airways   ? ? Name Placement date Placement time Site Days  ? Peripheral IV 11/16/21 20 G Left Forearm 11/16/21  --  Forearm  less than 1  ? External Urinary Catheter 11/16/21  1511  --  less than 1  ? Incision 11/01/12 Abdomen 11/01/12  1441  -- 3302  ? Incision (Closed) 02/08/21 Arm Right 02/08/21  1325  -- 281  ? Incision - 2 Ports Abdomen 1: Left;Upper 2: Left;Lower 11/01/12  1424  -- 3302  ? Wound Laceration Finger (Comment which one) Right rt middle finger laceration by mandolin silcer with scant drainage --  --  Finger (Comment which one)  --  ? ?  ?  ? ?  ? ? ?Intake/Output Last 24 hours ?No intake or output data in the 24 hours ending 11/16/21 2105 ? ?Labs/Imaging ?Results for orders placed or performed during the hospital encounter of 11/16/21 (from the past 48 hour(s))  ?CBC with Differential     Status: Abnormal  ? Collection Time: 11/16/21 12:50 PM  ?Result Value Ref Range  ? WBC 12.9 (H) 4.0 - 10.5 K/uL  ? RBC 4.31 3.87 - 5.11 MIL/uL  ? Hemoglobin 12.5 12.0 - 15.0 g/dL  ? HCT 37.6 36.0 - 46.0 %  ? MCV 87.2 80.0 - 100.0 fL  ? MCH 29.0 26.0 - 34.0 pg  ? MCHC 33.2 30.0 - 36.0 g/dL  ? RDW 13.0 11.5 - 15.5 %  ? Platelets 236 150 - 400 K/uL  ? nRBC 0.0 0.0 - 0.2 %  ? Neutrophils Relative % 82 %  ? Neutro Abs 10.7 (H) 1.7 - 7.7 K/uL  ? Lymphocytes Relative 8 %  ? Lymphs Abs 1.0 0.7 - 4.0 K/uL  ? Monocytes Relative 9 %  ? Monocytes Absolute 1.1 (H) 0.1 - 1.0 K/uL  ? Eosinophils Relative 0 %  ? Eosinophils Absolute 0.0 0.0 - 0.5 K/uL  ? Basophils Relative 0 %  ? Basophils Absolute 0.0 0.0 - 0.1 K/uL  ? Immature Granulocytes 1 %  ? Abs Immature Granulocytes 0.07 0.00 - 0.07 K/uL  ?  Comment: Performed at [facility], [place] [address], [place], [place] [address]  ?Basic metabolic panel     Status: Abnormal  ? Collection Time: 11/16/21 12:50 PM  ?Result Value Ref Range  ? Sodium 131 (L) 135 - 145 mmol/L  ? Potassium 3.9 3.5 - 5.1 mmol/L  ? Chloride 101 98 - 111 mmol/L  ? CO2 24 22 -  32 mmol/L  ? Glucose, Bld 119 (H) 70 - 99 mg/dL  ?  Comment: Glucose reference range applies only to samples taken after fasting for at least 8 hours.  ? BUN 20 8 - 23 mg/dL  ? Creatinine, Ser 0.32 (L) 0.44 - 1.00 mg/dL  ? Calcium 8.6 (L) 8.9 - 10.3 mg/dL  ? GFR, Estimated >60 >60 mL/min  ?  Comment: (NOTE) ?Calculated using  the CKD-EPI Creatinine Equation (2021) ?  ? Anion gap 6 5 - 15  ?  Comment: Performed at [facility], [place] [address] [place], [place] [address]  ?Sedimentation rate     Status: Abnormal  ? Collection Time: 11/16/21 12:50 PM  ?Result Value Ref Range  ? Sed Rate 28 (H) 0 - 22 mm/hr  ?  Comment: Performed at [facility], [place] [address], [place], [place] [address]  ?C-reactive protein     Status: Abnormal  ? Collection Time: 11/16/21 12:50 PM  ?Result Value Ref Range  ? CRP 7.0 (H) <1.0 mg/dL  ?  Comment: Performed at [facility], [place] [address], [place], [place] [address]  ?Magnesium     Status: None  ? Collection Time: 11/16/21 12:50 PM  ?Result Value Ref Range  ? Magnesium 1.8 1.7 - 2.4 mg/dL  ?  Comment: Performed at [facility], [place] [address], [place], [place] [address]  ?CBG monitoring, ED     Status: Abnormal  ? Collection Time: 11/16/21  1:38 PM  ?Result Value Ref Range  ? Glucose-Capillary 112 (H) 70 - 99 mg/dL  ?  Comment: Glucose reference range applies only to samples taken after fasting for at least 8 hours.  ?Cell count + diff,  w/ cryst-synvl fld     Status: Abnormal  ? Collection Time: 11/16/21  2:50 PM  ?Result Value Ref Range  ? Color, Synovial AMBER (A) YELLOW  ? Appearance-Synovial TURBID (A) CLEAR  ? Crystals, Fluid INTRACELLULAR CALCIUM PYROPHOSPHATE CRYSTALS   ?  Comment: EXTRACELLULAR CALCIUM PYROPHOSPHATE CRYSTALS  ? WBC, Synovial 29,070 (H) 0 - 200 /cu mm  ? Neutrophil, Synovial 80 (H) 0 - 25 %  ? Monocyte-Macrophage-Synovial Fluid 20 (L) 50 - 90 %  ?  Comment: Performed at [facility],  [place] [address], [place],  [address]  ? ?DG Knee 2 Views Right ? ?Result Date: 11/16/2021 ?CLINICAL DATA:  Pain right knee EXAM: RIGHT KNEE - 1-2 VIEW COMPARISON:  04/13/2021 FINDINGS: No recent fracture o

## 2021-11-17 ENCOUNTER — Encounter: Payer: Self-pay | Admitting: Orthopedic Surgery

## 2021-11-17 DIAGNOSIS — M25561 Pain in right knee: Secondary | ICD-10-CM | POA: Diagnosis present

## 2021-11-17 DIAGNOSIS — M1990 Unspecified osteoarthritis, unspecified site: Secondary | ICD-10-CM | POA: Diagnosis not present

## 2021-11-17 DIAGNOSIS — Z91030 Bee allergy status: Secondary | ICD-10-CM | POA: Diagnosis not present

## 2021-11-17 DIAGNOSIS — R531 Weakness: Secondary | ICD-10-CM

## 2021-11-17 DIAGNOSIS — M13861 Other specified arthritis, right knee: Secondary | ICD-10-CM | POA: Diagnosis present

## 2021-11-17 DIAGNOSIS — E871 Hypo-osmolality and hyponatremia: Secondary | ICD-10-CM | POA: Diagnosis present

## 2021-11-17 DIAGNOSIS — R55 Syncope and collapse: Secondary | ICD-10-CM | POA: Diagnosis present

## 2021-11-17 DIAGNOSIS — Z888 Allergy status to other drugs, medicaments and biological substances status: Secondary | ICD-10-CM | POA: Diagnosis not present

## 2021-11-17 DIAGNOSIS — M1711 Unilateral primary osteoarthritis, right knee: Secondary | ICD-10-CM | POA: Diagnosis not present

## 2021-11-17 DIAGNOSIS — Z79899 Other long term (current) drug therapy: Secondary | ICD-10-CM | POA: Diagnosis not present

## 2021-11-17 DIAGNOSIS — I10 Essential (primary) hypertension: Secondary | ICD-10-CM | POA: Diagnosis present

## 2021-11-17 DIAGNOSIS — M11261 Other chondrocalcinosis, right knee: Secondary | ICD-10-CM | POA: Diagnosis present

## 2021-11-17 DIAGNOSIS — Z79891 Long term (current) use of opiate analgesic: Secondary | ICD-10-CM | POA: Diagnosis not present

## 2021-11-17 DIAGNOSIS — M25461 Effusion, right knee: Secondary | ICD-10-CM

## 2021-11-17 DIAGNOSIS — F411 Generalized anxiety disorder: Secondary | ICD-10-CM | POA: Diagnosis present

## 2021-11-17 DIAGNOSIS — Z87891 Personal history of nicotine dependence: Secondary | ICD-10-CM | POA: Diagnosis not present

## 2021-11-17 DIAGNOSIS — Z79890 Hormone replacement therapy (postmenopausal): Secondary | ICD-10-CM | POA: Diagnosis not present

## 2021-11-17 DIAGNOSIS — K31819 Angiodysplasia of stomach and duodenum without bleeding: Secondary | ICD-10-CM

## 2021-11-17 LAB — CBC WITH DIFFERENTIAL/PLATELET
Abs Immature Granulocytes: 0.06 10*3/uL (ref 0.00–0.07)
Basophils Absolute: 0 10*3/uL (ref 0.0–0.1)
Basophils Relative: 0 %
Eosinophils Absolute: 0.1 10*3/uL (ref 0.0–0.5)
Eosinophils Relative: 1 %
HCT: 34.2 % — ABNORMAL LOW (ref 36.0–46.0)
Hemoglobin: 11.3 g/dL — ABNORMAL LOW (ref 12.0–15.0)
Immature Granulocytes: 1 %
Lymphocytes Relative: 19 %
Lymphs Abs: 1.8 10*3/uL (ref 0.7–4.0)
MCH: 28.9 pg (ref 26.0–34.0)
MCHC: 33 g/dL (ref 30.0–36.0)
MCV: 87.5 fL (ref 80.0–100.0)
Monocytes Absolute: 0.9 10*3/uL (ref 0.1–1.0)
Monocytes Relative: 9 %
Neutro Abs: 6.9 10*3/uL (ref 1.7–7.7)
Neutrophils Relative %: 70 %
Platelets: 211 10*3/uL (ref 150–400)
RBC: 3.91 MIL/uL (ref 3.87–5.11)
RDW: 13.2 % (ref 11.5–15.5)
WBC: 9.7 10*3/uL (ref 4.0–10.5)
nRBC: 0 % (ref 0.0–0.2)

## 2021-11-17 LAB — COMPREHENSIVE METABOLIC PANEL
ALT: 14 U/L (ref 0–44)
AST: 11 U/L — ABNORMAL LOW (ref 15–41)
Albumin: 3 g/dL — ABNORMAL LOW (ref 3.5–5.0)
Alkaline Phosphatase: 35 U/L — ABNORMAL LOW (ref 38–126)
Anion gap: 6 (ref 5–15)
BUN: 20 mg/dL (ref 8–23)
CO2: 23 mmol/L (ref 22–32)
Calcium: 8.4 mg/dL — ABNORMAL LOW (ref 8.9–10.3)
Chloride: 102 mmol/L (ref 98–111)
Creatinine, Ser: 0.78 mg/dL (ref 0.44–1.00)
GFR, Estimated: >60 mL/min (ref >60)
Glucose, Bld: 125 mg/dL — ABNORMAL HIGH (ref 70–99)
Potassium: 3.7 mmol/L (ref 3.5–5.1)
Sodium: 131 mmol/L — ABNORMAL LOW (ref 135–145)
Total Bilirubin: 0.5 mg/dL (ref 0.3–1.2)
Total Protein: 5.9 g/dL — ABNORMAL LOW (ref 6.5–8.1)

## 2021-11-17 LAB — URINALYSIS, COMPLETE (UACMP) WITH MICROSCOPIC
Bilirubin Urine: NEGATIVE
Glucose, UA: NEGATIVE mg/dL
Hgb urine dipstick: NEGATIVE
Ketones, ur: 5 mg/dL — AB
Leukocytes,Ua: NEGATIVE
Nitrite: NEGATIVE
Protein, ur: NEGATIVE mg/dL
Specific Gravity, Urine: 1.017 (ref 1.005–1.030)
pH: 6 (ref 5.0–8.0)

## 2021-11-17 LAB — MAGNESIUM: Magnesium: 1.8 mg/dL (ref 1.7–2.4)

## 2021-11-17 LAB — LACTIC ACID, PLASMA: Lactic Acid, Venous: 0.7 mmol/L (ref 0.5–1.9)

## 2021-11-17 MED ORDER — ACYCLOVIR 200 MG PO CAPS
200.0000 mg | ORAL_CAPSULE | Freq: Every day | ORAL | Status: DC
  Administered 2021-11-17 – 2021-11-18 (×2): 200 mg via ORAL
  Filled 2021-11-17 (×2): qty 1

## 2021-11-17 MED ORDER — SODIUM HYALURONATE (VISCOSUP) 20 MG/2ML IX SOSY
20.0000 mg | PREFILLED_SYRINGE | INTRA_ARTICULAR | Status: DC | PRN
  Administered 2021-11-14: 20 mg via INTRA_ARTICULAR

## 2021-11-17 MED ORDER — BUPIVACAINE HCL (PF) 0.25 % IJ SOLN
30.0000 mL | Freq: Once | INTRAMUSCULAR | Status: AC
  Administered 2021-11-17: 30 mL
  Filled 2021-11-17: qty 30

## 2021-11-17 MED ORDER — KETOROLAC TROMETHAMINE 15 MG/ML IJ SOLN
15.0000 mg | Freq: Four times a day (QID) | INTRAMUSCULAR | Status: DC
  Administered 2021-11-17 – 2021-11-18 (×4): 15 mg via INTRAVENOUS
  Filled 2021-11-17 (×4): qty 1

## 2021-11-17 MED ORDER — KETOROLAC TROMETHAMINE 30 MG/ML IJ SOLN
30.0000 mg | Freq: Once | INTRAMUSCULAR | Status: AC
  Administered 2021-11-17: 30 mg via INTRAVENOUS
  Filled 2021-11-17: qty 1

## 2021-11-17 MED ORDER — FINASTERIDE 5 MG PO TABS
5.0000 mg | ORAL_TABLET | Freq: Every day | ORAL | Status: DC
  Administered 2021-11-17 – 2021-11-18 (×2): 5 mg via ORAL
  Filled 2021-11-17 (×2): qty 1

## 2021-11-17 MED ORDER — ESTROGENS CONJUGATED 0.3 MG PO TABS
0.6000 mg | ORAL_TABLET | Freq: Every day | ORAL | Status: DC
  Administered 2021-11-17 – 2021-11-18 (×2): 0.6 mg via ORAL
  Filled 2021-11-17 (×2): qty 2

## 2021-11-17 MED ORDER — LIDOCAINE HCL (PF) 1 % IJ SOLN
30.0000 mL | Freq: Once | INTRAMUSCULAR | Status: AC
  Administered 2021-11-17: 30 mL
  Filled 2021-11-17: qty 30

## 2021-11-17 MED ORDER — ESTROGENS CONJUGATED 0.625 MG PO TABS
0.6250 mg | ORAL_TABLET | Freq: Every day | ORAL | Status: DC
  Filled 2021-11-17 (×2): qty 1

## 2021-11-17 MED ORDER — LIDOCAINE HCL 1 % IJ SOLN
5.0000 mL | INTRAMUSCULAR | Status: DC | PRN
  Administered 2021-11-14: 5 mL

## 2021-11-17 MED ORDER — LIDOCAINE HCL 1 % IJ SOLN
10.0000 mL | Freq: Once | INTRAMUSCULAR | Status: DC
Start: 2021-11-17 — End: 2021-11-17

## 2021-11-17 NOTE — Evaluation (Signed)
Occupational Therapy Evaluation ?Patient Details ?Name: Haley Wade ?MRN: [id number] ?DOB: Aug 27, 1945 ?Today's Date: 11/17/2021 ? ? ?History of Present Illness Haley Wade is a 76 y.o. female with medical history significant for osteoarthritis of the right knee, chronic hyponatremia associated baseline serum sodium range of 1 28-1 34, generalized anxiety disorder, essential hypertension who is admitted to [facility] on 11/16/2021 with inflammatory monoarthropathy of the right knee after presenting from home to [facility] ED complaining of right knee pain.  ? ?Clinical Impression ?  ?Ms. [name] is a 76 year old woman who presents with right knee pain and stiffness but able to perform functional mobility and ADLs on evaluation. 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 Therapist urged patient to ask MD/ortho in regards to level of activity allowed but otherwise patient has no OT needs. Patient's home environment without barriers.   ?   ? ?Recommendations for follow up therapy are one component of a multi-disciplinary discharge planning process, led by the attending physician.  Recommendations may be updated based on patient status, additional functional criteria and insurance authorization.  ? ?Follow Up Recommendations ? No OT follow up  ?  ?Assistance Recommended at Discharge None  ?Patient can return home with the following   ? ?  ?Functional Status Assessment ? Patient has had a recent decline in their functional status and demonstrates the ability to make significant improvements in function in a reasonable and predictable amount of time.  ?Equipment Recommendations ? None recommended by OT  ?  ?Recommendations for Other Services   ? ? ?  ?Precautions / Restrictions Precautions ?Precautions: Fall ?Precaution Comments: reports knee feels like it could buckle ?Restrictions ?Weight Bearing Restrictions: No  ? ?   ? ?Mobility Bed Mobility ?Overal bed mobility: Modified Independent ?  ?  ?  ?  ?  ?  ?  ?  ? ?Transfers ?Overall transfer level: Modified independent ?  ?  ?  ?  ?  ?  ?  ?  ?  ?  ? ?  ?Balance Overall balance assessment: Mild deficits observed, not formally tested ?  ?  ?  ?  ?  ?  ?  ?  ?  ?  ?  ?  ?  ?  ?  ?  ?  ?  ?   ? ?ADL either performed or assessed with clinical judgement  ? ?ADL Overall ADL's : Modified independent ?  ?  ?  ?  ?  ?  ?  ?  ?  ?  ?  ?  ?  ?  ?  ?  ?  ?  ?  ?General ADL Comments: reports fiance brought her a shower chair  ? ? ? ?Vision Patient Visual Report: No change from baseline ?   ?   ?Perception   ?  ?Praxis   ?  ? ?Pertinent Vitals/Pain Pain Assessment ?Pain Assessment: Faces ?Faces Pain Scale: Hurts little more ?Pain Location: R knee ?Pain Descriptors / Indicators: Grimacing ?Pain Intervention(s): Limited activity within patient's tolerance  ? ? ? ?Hand Dominance Right ?  ?Extremity/Trunk Assessment Upper Extremity Assessment ?Upper Extremity Assessment: Overall WFL for tasks assessed ?  ?Lower Extremity Assessment ?Lower Extremity Assessment: Defer to PT evaluation ?  ?Cervical / Trunk Assessment ?Cervical / Trunk Assessment: Normal ?  ?Communication Communication ?Communication: No difficulties ?  ?Cognition Arousal/Alertness: Awake/alert ?Behavior During Therapy: [facility] for tasks  assessed/performed ?Overall Cognitive Status: Within Functional Limits for tasks assessed ?  ?  ?  ?  ?  ?  ?  ?  ?  ?  ?  ?  ?  ?  ?  ?  ?  ?  ?  ?General Comments    ? ?  ?Exercises   ?  ?Shoulder Instructions    ? ? ?Home Living Family/patient expects to be discharged to:: Private residence ?Living Arrangements: Alone ?Available Help at Discharge: Friend(s);Available PRN/intermittently ?Type of Home:  (Condo) ?Home Access: Level entry;Elevator ?  ?  ?Home Layout: One level ?  ?  ?Bathroom Shower/Tub: Walk-in shower ?  ?Bathroom Toilet: Handicapped height ?  ?  ?Home Equipment: None ?  ?  ?  ? ?  ?Prior  Functioning/Environment Prior Level of Function : Independent/Modified Independent ?  ?  ?  ?  ?  ?  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  ? ?  ?  ?OT Problem List:   ?  ?   ?OT Treatment/Interventions:    ?  ?OT Goals(Current goals can be found in the care plan section) Acute Rehab OT Goals ?OT Goal Formulation: All assessment and education complete, DC therapy  ?OT Frequency:   ?  ? ?Co-evaluation   ?  ?  ?  ?  ? ?  ?AM-PAC OT "6 Clicks" Daily Activity     ?Outcome Measure Help from another person eating meals?: None ?Help from another person taking care of personal grooming?: None ?Help from another person toileting, which includes using toliet, bedpan, or urinal?: None ?Help from another person bathing (including washing, rinsing, drying)?: None ?Help from another person to put on and taking off regular upper body clothing?: None ?Help from another person to put on and taking off regular lower body clothing?: None ?6 Click Score: 24 ?  ?End of Session Nurse Communication:  (okay to see) ? ?Activity Tolerance: Patient tolerated treatment well ?Patient left: in chair;with call bell/phone within reach ? ?OT Visit Diagnosis: Pain ?Pain - Right/Left: Right ?Pain - part of body: Knee  ?              ?Time: [phone] ?OT Time Calculation (min): 17 min ?Charges:  OT General Charges ?$OT Visit: 1 Visit ?OT Evaluation ?$OT Eval Low Complexity: 1 Low ? ?Leanza Shepperson, OTR/L ?Acute Care Rehab Services  ?Office [phone] ?Pager: [phone]  ? ?Almando Brawley L Shina Wass ?11/17/2021, 11:04 AM ?

## 2021-11-17 NOTE — Progress Notes (Signed)
Haley Wade is a 76 year old patient underwent Euflexxa injection #1 on Monday of this week. ?She developed some pain and swelling in the knee yesterday which required evaluation and admission to the hospital for pain and weakness related to the knee. ?Aspiration of the knee in the emergency department demonstrated crystals +15,000 white count. ?Patient is feeling better today. ? ?I aspirated the knee and obtained about 20 cc of serosanguineous fluid ? ?Injected 10 cc of bupivacaine and 1 cc of Toradol to decrease inflammation. ?This could be an allergic reaction combined with pseudogout which is consistent with her pathologic findings infection looks less likely ? ?Anticipate discharge tomorrow after physical therapy.  Patient is inquiring about resuming her spironolactone which I will defer to the primary care team ?

## 2021-11-17 NOTE — Assessment & Plan Note (Addendum)
Prn ativan ?Trazodone nightly ? ? ? ?

## 2021-11-17 NOTE — Hospital Course (Addendum)
76 y.o. female with medical history significant for osteoarthritis of the right knee, chronic hyponatremia, generalized anxiety disorder, essential hypertension who is admitted to [facility] on 11/16/2021 with inflammatory monoarthropathy of the right knee after presenting from home to [facility] ED complaining of right knee pain.  She had aspiration with synovial fluid c/w pseudogout.  Seen by ortho and s/p aspiration/injection on 4/6 -> thought possibly allergic reaction to gel injection in addition to pseudogout.  Improved on 4/7, discharge with outpatient follow up. ?

## 2021-11-17 NOTE — Assessment & Plan Note (Addendum)
Received R knee gel injection prior to admission ?Subsequently developed worsening R knee pain ?S/p arthrocentesis 4/5 ?Synovial fluid 4/5 with intracellular calcium pyrophosphate crystals, 29,070 WBC neutrophil predominant.  Glucose pending.  Gram stain without organisms, culture NGx12 hrs. ?Sed rate 28, CRP 7 ?Seems consistent with pseudogout, will be important to follow culture results with hx injection and syncope/chills.  ?Will continue toradol for now ?Appreciate orthopedics eval/management -> aspirated 20 cc of serosanguinous fluid 4/6 and injection of bupivicaine and toradol - noted possibly allergic reaction with pseudogout, infection thought less likely ?

## 2021-11-17 NOTE — Progress Notes (Signed)
? ?  Procedure Note ? ?Patient: Haley Wade             ?Date of Birth: [birth year]           ?MRN: [id number]             ?Visit Date: 11/14/2021 ? ?Procedures: ?Visit Diagnoses:  ?1. Unilateral primary osteoarthritis, right knee   ? ? ?Large Joint Inj: R knee on 11/14/2021 7:07 PM ?Indications: pain, joint swelling and diagnostic evaluation ?Details: 18 G 1.5 in needle, superolateral approach ? ?Arthrogram: No ? ?Medications: 5 mL lidocaine 1 %; 20 mg Sodium Hyaluronate 20 MG/2ML ?Outcome: tolerated well, no immediate complications ?Procedure, treatment alternatives, risks and benefits explained, specific risks discussed. Consent was given by the patient. Immediately prior to procedure a time out was called to verify the correct patient, procedure, equipment, support staff and site/side marked as required. Patient was prepped and draped in the usual sterile fashion.  ? ? ? ? ? ?

## 2021-11-17 NOTE — Progress Notes (Signed)
?PROGRESS NOTE ? ? ? ?Haley Wade  TDH:[id number] DOB: 03-27-1946 DOA: 11/16/2021 ?PCP: Shon Baton, MD  ?Chief Complaint  ?Patient presents with  ? Knee Pain  ? ? ?Brief Narrative:  ? 76 y.o. female with medical history significant for osteoarthritis of the right knee, chronic hyponatremia, generalized anxiety disorder, essential hypertension who is admitted to [facility] on 11/16/2021 with inflammatory monoarthropathy of the right knee after presenting from home to [facility] ED complaining of right knee pain.   ? ? ?Assessment & Plan: ?  ?Principal Problem: ?  Inflammatory arthritis ?Active Problems: ?  Syncope ?  Chronic hyponatremia ?  GAD (generalized anxiety disorder) ?  Generalized weakness ?  GAVE (gastric antral vascular ectasia) ? ? ?Assessment and Plan: ?* Inflammatory arthritis ?Received R knee gel injection prior to admission ?Subsequently developed worsening R knee pain ?S/p arthrocentesis 4/5 ?Synovial fluid 4/5 with intracellular calcium pyrophosphate crystals, 29,070 WBC neutrophil predominant.  Glucose pending.  Gram stain without organisms, culture NGx12 hrs. ?Sed rate 28, CRP 7 ?Seems consistent with pseudogout, will be important to follow culture results with hx injection and syncope/chills.  ?Will continue toradol for now ?Awaiting orthopedics eval/recommendations ? ?Syncope ?In setting of above ?Possibly related to hypovolemia with poor PO intake in setting of malaise? Related to pain? ?EKG with sinus rhythm, normal QTc ?Negative orthostatics, but BP's on low side ?Holding spironolactone for now, holding metoprolol ? ? ? ?GAD (generalized anxiety disorder) ?Prn ativan ?Trazodone nightly ? ? ? ? ?Chronic hyponatremia ?Mild, follow ? ? ?Generalized weakness ?TSH wnl, MMA pending ? ? ? ? ?DVT prophylaxis: lovenox ?Code Status: full ?Family Communication: none ?Disposition:  ? ?Status is: Observation ?The patient remains OBS appropriate and will d/c before 2 midnights. ?  ?Consultants:   ?ortho ? ?Procedures:  ?Arthrocentesis 4/5 ? ?Antimicrobials:  ?Anti-infectives (From admission, onward)  ? ? Start     Dose/Rate Route Frequency Ordered Stop  ? 11/17/21 1600  acyclovir (ZOVIRAX) 200 MG capsule 200 mg       ? 200 mg Oral Daily 11/17/21 1502    ? ?  ? ? ?Subjective: ?Feels generally better ? ?Objective: ?Vitals:  ? 11/17/21 0153 11/17/21 0507 11/17/21 0949 11/17/21 1244  ?BP: 103/67 (!) 99/59 (!) 109/59 103/85  ?Pulse: 80 79 71 78  ?Resp: '18 18 18 18  '$ ?Temp: 97.8 ?F (36.6 ?C) 98 ?F (36.7 ?C) 98.2 ?F (36.8 ?C) 98.4 ?F (36.9 ?C)  ?TempSrc: Oral Oral Oral Oral  ?SpO2: 97% 95% 93% 98%  ?Weight:      ? ? ?Intake/Output Summary (Last 24 hours) at 11/17/2021 1533 ?Last data filed at 11/17/2021 0500 ?Gross per 24 hour  ?Intake --  ?Output 300 ml  ?Net -300 ml  ? ?Filed Weights  ? 11/16/21 1238  ?Weight: 43.5 kg  ? ? ?Examination: ? ?General exam: Appears calm and comfortable  ?Respiratory system: unlabored ?Cardiovascular system: RRR ?Gastrointestinal system: Abdomen is nondistended, soft and nontender.  ?Central nervous system: Alert and oriented. No focal neurological deficits. ?Extremities: mild R knee swelling, no significant pain ? ? ? ?Data Reviewed: I have personally reviewed following labs and imaging studies ? ?CBC: ?Recent Labs  ?Lab 11/16/21 ?1250 11/17/21 ?0009  ?WBC 12.9* 9.7  ?NEUTROABS 10.7* 6.9  ?HGB 12.5 11.3*  ?HCT 37.6 34.2*  ?MCV 87.2 87.5  ?PLT 236 211  ? ? ?Basic Metabolic Panel: ?Recent Labs  ?Lab 11/16/21 ?1250 11/17/21 ?0009  ?NA 131* 131*  ?K 3.9 3.7  ?CL 101 102  ?  CO2 24 23  ?GLUCOSE 119* 125*  ?BUN 20 20  ?CREATININE 0.32* 0.78  ?CALCIUM 8.6* 8.4*  ?MG 1.8 1.8  ? ? ?GFR: ?CrCl cannot be calculated (Unknown ideal weight.). ? ?Liver Function Tests: ?Recent Labs  ?Lab 11/17/21 ?0009  ?AST 11*  ?ALT 14  ?ALKPHOS 35*  ?BILITOT 0.5  ?PROT 5.9*  ?ALBUMIN 3.0*  ? ? ?CBG: ?Recent Labs  ?Lab 11/16/21 ?1338  ?GLUCAP 112*  ? ? ? ?Recent Results (from the past 240 hour(s))  ?Body fluid culture w  Gram Stain     Status: None (Preliminary result)  ? Collection Time: 11/16/21  2:50 PM  ? Specimen: Synovium; Body Fluid  ?Result Value Ref Range Status  ? Specimen Description   Final  ?  SYNOVIAL ?Performed at [facility], [place] [address], [place], [place] [address] ?  ? Special Requests   Final  ?  NONE ?Performed at [facility], [place] [address] [place], [place] [address] ?  ? Gram Stain   Final  ?  NO SQUAMOUS EPITHELIAL CELLS SEEN ?FEW WBC SEEN ?NO ORGANISMS SEEN ?  ? Culture   Final  ?  NO GROWTH < 12 HOURS ?Performed at [facility], [place] [address], [place], [place] [address] ?  ? Report Status PENDING  Incomplete  ?  ? ? ? ? ? ?Radiology Studies: ?DG Knee 2 Views Right ? ?Result Date: 11/16/2021 ?CLINICAL DATA:  Pain right knee EXAM: RIGHT KNEE - 1-2 VIEW COMPARISON:  04/13/2021 FINDINGS: No recent fracture or dislocation is seen in the right knee. Meniscal cartilage calcifications are seen in the medial and lateral compartments. There are small linear calcific densities posterior to the distal femur and in the region of suprapatellar bursa. There is small to moderate effusion in the suprapatellar bursa. IMPRESSION: No recent fracture or dislocation is seen in the right knee. Small to moderate effusion is present in the suprapatellar bursa. Chondrocalcinosis suggesting degenerative arthritis. There are small calcific densities in the popliteal fossa and suprapatellar bursa suggesting possible loose bodies or soft tissue calcifications from chronic inflammation. Electronically Signed   By: Elmer Picker M.D.   On: 11/16/2021 13:56   ? ? ? ? ? ?Scheduled Meds: ? acyclovir  200 mg Oral Daily  ? enoxaparin (LOVENOX) injection  30 mg Subcutaneous Q24H  ? estrogens (conjugated)  0.625 mg Oral Daily  ? finasteride  5 mg Oral Daily  ? iron polysaccharides  150 mg Oral Q breakfast  ? ketorolac  15 mg Intravenous Q6H  ? ?Continuous Infusions: ? ? LOS: 0 days  ? ? ?Time  spent: over 30 min ? ? ? ?Fayrene Helper, MD ?[facility] ? ? ?To contact the attending provider between 7A-7P or the covering provider during after hours 7P-7A, please log into the web site [email or web address] and access using universal [place] password for that web site. If you do not have the password, please call the hospital operator. ? ?11/17/2021, 3:33 PM  ? ? ?

## 2021-11-17 NOTE — Evaluation (Signed)
Physical Therapy Evaluation ?Patient Details ?Name: Haley Wade ?MRN: [id number] ?DOB: 07-03-1946 ?Today's Date: 11/17/2021 ? ?History of Present Illness ? Haley Wade is a 76 y.o. female with medical history significant for osteoarthritis of the right knee, chronic hyponatremia associated baseline serum sodium range of 1 28-1 34, generalized anxiety disorder, essential hypertension who is admitted to [facility] on 11/16/2021 with inflammatory monoarthropathy of the right knee after presenting from home to [facility] complaining of right knee pain. ?  ?Clinical Impression ? Pt is a 76 y.o. female with above HPI resulting in the deficits listed below (see PT Problem List). 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 Pt performed all mobility with modified independence. Pt ambulated total of 346f without use of AD and trials of SPC and quad cane for improved stability due to intermittent staggering and knee buckling due to R knee pain. Pt able to correct LOB without physical assist from therapist. Pt will have assist from friends and significant other upon d/c. Discussed use of assistive device upon d/c to maximize safety with current deficits. No skilled PT needs identified at this time. PT will sign off. Please reconsult if mobility status changes.    ?   ? ?Recommendations for follow up therapy are one component of a multi-disciplinary discharge planning process, led by the attending physician.  Recommendations may be updated based on patient status, additional functional criteria and insurance authorization. ? ?Follow Up Recommendations No PT follow up ? ?  ?Assistance Recommended at Discharge PRN  ?Patient can return home with the following ?   ? ?  ?Equipment Recommendations  (discussed use of SPC/quad cane for stability.)  ?Recommendations for Other Services ?    ?  ?Functional Status Assessment Patient has had a recent decline in their functional status and  demonstrates the ability to make significant improvements in function in a reasonable and predictable amount of time.  ? ?  ?Precautions / Restrictions Precautions ?Precautions: Fall ?Precaution Comments: reports knee feels like it could buckle sometimes ?Restrictions ?Weight Bearing Restrictions: No  ? ?  ? ?Mobility ? Bed Mobility ?Overal bed mobility: Modified Independent ?  ?  ?  ?  ?  ?  ?  ?  ? ?Transfers ?Overall transfer level: Modified independent ?Equipment used: None ?  ?  ?  ?  ?  ?  ?  ?General transfer comment: STS x2; use of UEs for power up ?  ? ?Ambulation/Gait ?Ambulation/Gait assistance: Supervision, Modified independent (Device/Increase time) ?Gait Distance (Feet): 300 Feet ?Assistive device: None, Straight cane, Quad cane ?Gait Pattern/deviations: Step-through pattern ?Gait velocity: decr ?  ?  ?General Gait Details: intermittent staggering/R knee buckle during ambulation due to R knee pain. Pt able to correct and maintain upright standing balance without physical assist from therapist. Discussed use of SPC/quad cane to assist with stability while experiencing balance impairments and knee pain. Pt very agreeable. Trialed ambulation with use of SPC and quad cane, pt reporting more stability with use of quad cane. Discussed where able to find upon d/c. ? ?Stairs ?  ?  ?  ?  ?  ? ?Wheelchair Mobility ?  ? ?Modified Rankin (Stroke Patients Only) ?  ? ?  ? ?Balance Overall balance assessment: Mild deficits observed, not formally tested ?  ?  ?  ?  ?  ?  ?  ?  ?  ?  ?  ?  ?  ?  ?  ?  ?  ?  ?   ? ? ? ?  Pertinent Vitals/Pain Pain Assessment ?Pain Assessment: 0-10 ?Pain Score: 4  ?Pain Location: R knee ?Pain Descriptors / Indicators: Grimacing, Discomfort, Sore ?Pain Intervention(s): Limited activity within patient's tolerance, Monitored during session, Repositioned (RN in during session, reported will apply cold pack.)  ? ? ?Home Living Family/patient expects to be discharged to:: Private  residence ?Living Arrangements: Alone ?Available Help at Discharge: Friend(s);Available PRN/intermittently;Other (Comment) (significant other) ?Type of Home:  (Condo) ?Home Access: Level entry;Elevator ?  ?  ?  ?Home Layout: One level ?Home Equipment: None ?Additional Comments: Will be moving with fiance into [facility] [employer] soon. 1 level, no steps to enter  ?  ?Prior Function Prior Level of Function : Independent/Modified Independent ?  ?  ?  ?  ?  ?  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 ?ADLs Comments: independent ?  ? ? ?Hand Dominance  ? Dominant Hand: Right ? ?  ?Extremity/Trunk Assessment  ? Upper Extremity Assessment ?Upper Extremity Assessment: Defer to OT evaluation ?  ? ?Lower Extremity Assessment ?Lower Extremity Assessment: Generalized weakness ?  ? ?Cervical / Trunk Assessment ?Cervical / Trunk Assessment: Normal  ?Communication  ? Communication: No difficulties  ?Cognition Arousal/Alertness: Awake/alert ?Behavior During Therapy: [facility] for tasks assessed/performed ?Overall Cognitive Status: Within Functional Limits for tasks assessed ?  ?  ?  ?  ?  ?  ?  ?  ?  ?  ?  ?  ?  ?  ?  ?  ?  ?  ?  ? ?  ?General Comments   ? ?  ?Exercises    ? ?Assessment/Plan  ?  ?PT Assessment Patient does not need any further PT services  ?PT Problem List   ? ?   ?  ?PT Treatment Interventions     ? ?PT Goals (Current goals can be found in the Care Plan section)  ?Acute Rehab PT Goals ?Patient Stated Goal: Decrease swelling in R knee and figure out what is causing issues- want to be able to remain active if possible ?PT Goal Formulation: With patient/family ?Time For Goal Achievement: 12/01/21 ?Potential to Achieve Goals: Good ? ?  ?Frequency   ?  ? ? ?Co-evaluation   ?  ?  ?  ?  ? ? ?  ?AM-PAC PT "6 Clicks" Mobility  ?Outcome Measure Help needed turning from your back to your side while in a flat bed without using bedrails?:  None ?Help needed moving from lying on your back to sitting on the side of a flat bed without using bedrails?: None ?Help needed moving to and from a bed to a chair (including a wheelchair)?: None ?Help needed standing up from a chair using your arms (e.g., wheelchair or bedside chair)?: None ?Help needed to walk in hospital room?: A Little ?Help needed climbing 3-5 steps with a railing? : A Little ?6 Click Score: 22 ? ?  ?End of Session Equipment Utilized During Treatment: Gait belt ?Activity Tolerance: Patient tolerated treatment well ?Patient left: in chair;with call bell/phone within reach;with family/visitor present ?Nurse Communication: Mobility status ?PT Visit Diagnosis: Muscle weakness (generalized) (M62.81);Pain;Difficulty in walking, not elsewhere classified (R26.2) ?Pain - Right/Left: Right ?Pain - part of body: Knee ?  ? ?Time: 1413-1441 (Pt exiting room from 224p-231p to grab assistive devices. Total treatment time= 60mn) ?PT Time Calculation (min) (ACUTE ONLY): 28 min ? ? ?Charges:   PT Evaluation ?$PT Eval Low Complexity: 1 Low ?  ?  ?   ? ? ?  Festus Barren., PT, DPT  ?Acute Rehabilitation Services  ?Office [phone] ? ?11/17/2021, 3:03 PM ? ?

## 2021-11-17 NOTE — Assessment & Plan Note (Addendum)
In setting of above ?Possibly related to hypovolemia with poor PO intake in setting of malaise? Related to pain? ?EKG with sinus rhythm, normal QTc ?Negative orthostatics, but BP's on low side ?Can resume home BP meds with improved BP today ? ? ?

## 2021-11-17 NOTE — Assessment & Plan Note (Addendum)
TSH wnl, MMA pending (follow outpatient) ? ? ?

## 2021-11-17 NOTE — Assessment & Plan Note (Addendum)
Mild, follow °

## 2021-11-18 DIAGNOSIS — M1990 Unspecified osteoarthritis, unspecified site: Secondary | ICD-10-CM | POA: Diagnosis not present

## 2021-11-18 LAB — GLUCOSE, BODY FLUID OTHER: Glucose, Body Fluid Other: 16 mg/dL

## 2021-11-18 NOTE — Discharge Summary (Signed)
Physician Discharge Summary  ?Haley Wade YSH:[id number] DOB: 1945/11/21 DOA: 11/16/2021 ? ?PCP: Haley Baton, MD ? ?Admit date: 11/16/2021 ?Discharge date: 11/18/2021 ? ?Time spent: 40 minutes ? ?Recommendations for Outpatient Follow-up:  ?Follow CBC/CMP  ?Follow final culture from R knee aspirate ?Follow pending MMA ?Follow blood pressures outpatient with resumption of spironolactone  ? ?Discharge Diagnoses:  ?Principal Problem: ?  Inflammatory arthritis ?Active Problems: ?  Syncope ?  Chronic hyponatremia ?  GAD (generalized anxiety disorder) ?  Generalized weakness ?  GAVE (gastric antral vascular ectasia) ? ? ?Discharge Condition: stable ? ?Diet recommendation: heart healthy ? ?Filed Weights  ? 11/16/21 1238  ?Weight: 43.5 kg  ? ? ?History of present illness:  ? 76 y.o. female with medical history significant for osteoarthritis of the right knee, chronic hyponatremia, generalized anxiety disorder, essential hypertension who is admitted to [facility] on 11/16/2021 with inflammatory monoarthropathy of the right knee after presenting from home to [facility] complaining of right knee pain.  She had aspiration with synovial fluid c/w pseudogout.  Seen by ortho and s/p aspiration/injection on 4/6 -> thought possibly allergic reaction to gel injection in addition to pseudogout.  Improved on 4/7, discharge with outpatient follow up. ? ?Hospital Course:  ?Assessment and Plan: ?* Inflammatory arthritis ?Received R knee gel injection prior to admission ?Subsequently developed worsening R knee pain ?S/p arthrocentesis 4/5 ?Synovial fluid 4/5 with intracellular calcium pyrophosphate crystals, 29,070 WBC neutrophil predominant.  Glucose pending.  Gram stain without organisms, culture NGx12 hrs. ?Sed rate 28, CRP 7 ?Seems consistent with pseudogout, will be important to follow culture results with hx injection and syncope/chills.  ?Will continue toradol for now ?Appreciate orthopedics eval/management -> aspirated 20 cc of  serosanguinous fluid 4/6 and injection of bupivicaine and toradol - noted possibly allergic reaction with pseudogout, infection thought less likely ? ?Syncope ?In setting of above ?Possibly related to hypovolemia with poor PO intake in setting of malaise? Related to pain? ?EKG with sinus rhythm, normal QTc ?Negative orthostatics, but BP's on low side ?Can resume home BP meds with improved BP today ? ? ? ?GAD (generalized anxiety disorder) ?Prn ativan ?Trazodone nightly ? ? ? ? ?Chronic hyponatremia ?Mild, follow ? ? ?Generalized weakness ?TSH wnl, MMA pending (follow outpatient) ? ? ? ? ?Procedures: ? Arthrocentesis 4/5 ?Aspiration injection 4/6 ? ?Consultations: ?ortho ? ?Discharge Exam: ?Vitals:  ? 11/17/21 2145 11/18/21 0555  ?BP: 112/65 128/71  ?Pulse: 83 80  ?Resp: 17 17  ?Temp: 98.4 ?F (36.9 ?C) 98.1 ?F (36.7 ?C)  ?SpO2: 99% 97%  ? ?No new complaints ?Eager for discharge ? ?General: No acute distress. ?Cardiovascular: RRR ?Lungs: unlabored ?Neurological: Alert and oriented ?3. Moves all extremities ?4. Cranial nerves II through XII grossly intact. ?Skin: Warm and dry. No rashes or lesions. ?Extremities: improved R knee swelling and pain  ? ?Discharge Instructions ? ? ?Discharge Instructions   ? ? Call MD for:  difficulty breathing, headache or visual disturbances   Complete by: As directed ?  ? Call MD for:  extreme fatigue   Complete by: As directed ?  ? Call MD for:  hives   Complete by: As directed ?  ? Call MD for:  persistant dizziness or light-headedness   Complete by: As directed ?  ? Call MD for:  persistant nausea and vomiting   Complete by: As directed ?  ? Call MD for:  redness, tenderness, or signs of infection (pain, swelling, redness, odor or green/yellow discharge around incision site)  Complete by: As directed ?  ? Call MD for:  severe uncontrolled pain   Complete by: As directed ?  ? Call MD for:  temperature >100.4   Complete by: As directed ?  ? Diet - low sodium heart healthy   Complete  by: As directed ?  ? Discharge instructions   Complete by: As directed ?  ? You were seen for pseudogout in your right knee.  You also may have had an allergic reaction to the gel. ? ?Your labs are consistent with pseudogout, you should follow the final cultures with your PCP and orthopedics doctor to make sure nothing grows. ? ?You can continue to use ibuprofen and tylenol as needed for pain.   ? ?I think your fainting was related to pain or poor oral intake and dehydration.  Please follow up with your primary care doctor regarding this issue. ? ?There's [facility] pending methylmalonic acid lab.  You should follow this with your primary care provider. ? ?Please follow up with orthopedics as planned. ? ?Return for new, recurrent, or worsening symptoms. ? ?Please ask your PCP to request records from this hospitalization so they know what was done and what the next steps will be.  ? Increase activity slowly   Complete by: As directed ?  ? ?  ? ?Allergies as of 11/18/2021   ? ?   Reactions  ? Plaquenil [hydroxychloroquine Sulfate] Rash, Other (See Comments)  ? "Covered all over in [facility] rash and welts"  ? Bee Venom Swelling, Other (See Comments)  ? Swelling at site where stung  ? Terconazole Nausea Only, Other (See Comments)  ? Headaches, also  ? ?  ? ?  ?Medication List  ?  ? ?TAKE these medications   ? ?acyclovir 200 MG capsule ?Commonly known as: ZOVIRAX ?Take 200 mg by mouth daily. ?  ?estrogens (conjugated) 0.625 MG tablet ?Commonly known as: PREMARIN ?Take 0.625 mg by mouth daily. ?  ?Ferrex 150 150 MG capsule ?Generic drug: iron polysaccharides ?Take 150 mg by mouth daily with breakfast. ?  ?finasteride 5 MG tablet ?Commonly known as: PROSCAR ?Take 5 mg by mouth daily. ?  ?fluticasone 50 MCG/ACT nasal spray ?Commonly known as: FLONASE ?Place 2 sprays into both nostrils daily as needed for allergies or rhinitis. ?  ?HYDROcodone-acetaminophen 5-325 MG tablet ?Commonly known as: NORCO/VICODIN ?Take 1 tablet by mouth every 6  (six) hours as needed for moderate pain. ?  ?ibuprofen 800 MG tablet ?Commonly known as: ADVIL ?Take 200-800 mg by mouth every 8 (eight) hours as needed (for pain). ?  ?LORazepam 0.5 MG tablet ?Commonly known as: ATIVAN ?Take 0.5 tablets (0.25 mg total) by mouth in the morning and at bedtime. ?  ?metoprolol succinate 25 MG 24 hr tablet ?Commonly known as: Toprol XL ?Take 0.5 tablets (12.5 mg total) by mouth daily. ?  ?omeprazole 20 MG capsule ?Commonly known as: PRILOSEC ?Take 20 mg by mouth daily as needed (for reflux). ?  ?spironolactone 50 MG tablet ?Commonly known as: ALDACTONE ?Take 1 tablet (50 mg total) by mouth daily. ?What changed: when to take this ?  ?Systane Ultra 0.4-0.3 % Soln ?Generic drug: Polyethyl Glycol-Propyl Glycol ?Place 1 drop into both eyes 3 (three) times daily as needed (dry eyes). ?  ?traZODone 50 MG tablet ?Commonly known as: DESYREL ?Take 1 tablet (50 mg total) by mouth at bedtime as needed for sleep. ?  ?URISED PO ?Take 1 tablet by mouth daily as needed (for bladder spasms). ?  ?Vitamin  D (Ergocalciferol) 1.25 MG (50000 UNIT) Caps capsule ?Commonly known as: DRISDOL ?Take 50,000 Units by mouth every Saturday. ?  ?zaleplon 10 MG capsule ?Commonly known as: SONATA ?TAKE ONE CAPSULE AT BEDTIME AS NEEDED FOR SLEEP ?What changed: See the new instructions. ?  ? ?  ? ?Allergies  ?Allergen Reactions  ? Plaquenil [Hydroxychloroquine Sulfate] Rash and Other (See Comments)  ?  "Covered all over in Rolen Conger rash and welts"  ? Bee Venom Swelling and Other (See Comments)  ?  Swelling at site where stung  ? Terconazole Nausea Only and Other (See Comments)  ?  Headaches, also  ? ? ? ? ?The results of significant diagnostics from this hospitalization (including imaging, microbiology, ancillary and laboratory) are listed below for reference.   ? ?Significant Diagnostic Studies: ?DG Knee 2 Views Right ? ?Result Date: 11/16/2021 ?CLINICAL DATA:  Pain right knee EXAM: RIGHT KNEE - 1-2 VIEW COMPARISON:  04/13/2021  FINDINGS: No recent fracture or dislocation is seen in the right knee. Meniscal cartilage calcifications are seen in the medial and lateral compartments. There are small linear calcific densities posterior to t

## 2021-11-18 NOTE — Progress Notes (Signed)
Cultures remain negative right knee ?Patient has been ambulating in the room this morning ?Okay for discharge from Ortho standpoint ?

## 2021-11-18 NOTE — Progress Notes (Signed)
Pt discharged with significant other. Reviewed discharged instructions, answered questions, removed PIV. Hortencia Conradi RN  ?

## 2021-11-20 LAB — BODY FLUID CULTURE W GRAM STAIN
Culture: NO GROWTH
Gram Stain: NONE SEEN

## 2021-11-20 LAB — METHYLMALONIC ACID, SERUM: Methylmalonic Acid, Quantitative: 256 nmol/L (ref 0–378)

## 2021-11-21 ENCOUNTER — Ambulatory Visit (INDEPENDENT_AMBULATORY_CARE_PROVIDER_SITE_OTHER): Payer: Medicare Other | Admitting: Psychiatry

## 2021-11-21 ENCOUNTER — Encounter: Payer: Self-pay | Admitting: Psychiatry

## 2021-11-21 DIAGNOSIS — F5105 Insomnia due to other mental disorder: Secondary | ICD-10-CM | POA: Diagnosis not present

## 2021-11-21 DIAGNOSIS — F411 Generalized anxiety disorder: Secondary | ICD-10-CM

## 2021-11-21 MED ORDER — LORAZEPAM 0.5 MG PO TABS: 0.2500 mg | ORAL_TABLET | Freq: Three times a day (TID) | ORAL | 2 refills | Status: DC | PRN

## 2021-11-21 NOTE — Progress Notes (Signed)
Haley Wade ?[id number] ?September 13, 1945 ?76 y.o. ? ?Subjective:  ? ?Patient ID:  Haley Wade is a 76 y.o. (DOB August 14, 1946) female. ? ?Chief Complaint:  ?Chief Complaint  ?Patient presents with  ? Follow-up  ? Generalized anxiety disorder  ? Sleeping Problem  ? ? ?HPI ?Haley Wade presents to the office today for follow-up of anxiety and insomnia. ? ?Challenging winter with health issues with GI problems.  Heart monitor and cardiac workup with palpitations.  Fall last week after starting morning walk and tripped. ? ?Problems with EMA and doesn't want to get used to taking zaleplon and about 3-4 times per week.  Has used imagery and other techniques to help sleep. 1 and 1/2 cup coffee am only. ?No hangover with zaleplon.  Weaned off lorazepam HS and then only prn.  Last used 02/08/21. ?No panic lately. And anxiety managed. ?Patient reports stable mood and denies depressed or irritable moods.  Patient denies any recent difficulty with anxiety.  Denies appetite disturbance.  Patient reports that energy and motivation have been good.  Patient denies any difficulty with concentration.  Patient denies any suicidal ideation. ? ?11/21/21 appt noted: ?Biggest issue is sleep.  Trazoadone SE constipation and hair loss.  Using zaleplon intermittently doesn't work well. ?Underlying stress.  Getting married.  Not sure what to do about some stress over it.Marland Kitchen ?SE Mirtazapine would never take it. ?Wants to take intermittent lorazepam. ?Pressure in her head with normal BP and on her scalp with stinging and itching or burning or pain behind right eye sometimes. ?Working 3 days weekly. ? ?Past Psychiatric Medication Trials: ?Past Psychiatric History:  Long history of counseling ?Remote antidepressants without help and Seroquel NR & SE. ?On lorazepam for years. SE sometimes hair loss.  But still the best. ?Alprazolam short duration.  Klonopin SE. ?SE Mirtazapine would never take it. ? ? ?Flowsheet Row ED to Hosp-Admission  (Discharged) from 11/16/2021 in [facility] ED from 04/13/2021 in [facility] (Discharged) from 02/08/2021 in [facility]  ?C-SSRS RISK CATEGORY No Risk No Risk No Risk  ? ?  ?  ? ?Review of Systems:  ?Review of Systems  ?Cardiovascular:  Positive for palpitations.  ?Neurological:  Negative for tremors and weakness.  ?Psychiatric/Behavioral:  Positive for sleep disturbance.   ? ?Medications: I have reviewed the patient's current medications. ? ?Current Outpatient Medications  ?Medication Sig Dispense Refill  ? acyclovir (ZOVIRAX) 200 MG capsule Take 200 mg by mouth daily.    ? estrogens, conjugated, (PREMARIN) 0.625 MG tablet Take 0.625 mg by mouth daily.     ? FERREX 150 150 MG capsule Take 150 mg by mouth daily with breakfast.    ? finasteride (PROSCAR) 5 MG tablet Take 5 mg by mouth daily.    ? fluticasone (FLONASE) 50 MCG/ACT nasal spray Place 2 sprays into both nostrils daily as needed for allergies or rhinitis.    ? HYDROcodone-acetaminophen (NORCO/VICODIN) 5-325 MG tablet Take 1 tablet by mouth every 6 (six) hours as needed for moderate pain. 30 tablet 0  ? ibuprofen (ADVIL) 800 MG tablet Take 200-800 mg by mouth every 8 (eight) hours as needed (for pain).    ? LORazepam (ATIVAN) 0.5 MG tablet Take 0.5 tablets (0.25 mg total) by mouth every 8 (eight) hours as needed for anxiety. 30 tablet 2  ? Methen-Bella-Meth Bl-Phen Sal (URISED PO) Take 1 tablet by mouth daily as needed (for bladder spasms).    ?  metoprolol succinate (TOPROL XL) 25 MG 24 hr tablet Take 0.5 tablets (12.5 mg total) by mouth daily. (Patient not taking: Reported on 11/17/2021) 45 tablet 3  ? omeprazole (PRILOSEC) 20 MG capsule Take 20 mg by mouth daily as needed (for reflux).    ? Polyethyl Glycol-Propyl Glycol (SYSTANE ULTRA) 0.4-0.3 % SOLN Place 1 drop into both eyes 3 (three) times daily as needed (dry eyes).    ? spironolactone (ALDACTONE) 50 MG  tablet Take 1 tablet (50 mg total) by mouth daily. (Patient taking differently: Take 50 mg by mouth 3 (three) times daily.)    ? traZODone (DESYREL) 50 MG tablet Take 1 tablet (50 mg total) by mouth at bedtime as needed for sleep. 90 tablet 0  ? Vitamin D, Ergocalciferol, (DRISDOL) 1.25 MG (50000 UNIT) CAPS capsule Take 50,000 Units by mouth every Saturday.    ? zaleplon (SONATA) 10 MG capsule TAKE ONE CAPSULE AT BEDTIME AS NEEDED FOR SLEEP (Patient taking differently: Take 10 mg by mouth at bedtime as needed.) 30 capsule 0  ? ?No current facility-administered medications for this visit.  ? ? ?Medication Side Effects: None ? ?Allergies:  ?Allergies  ?Allergen Reactions  ? Plaquenil [Hydroxychloroquine Sulfate] Rash and Other (See Comments)  ?  "Covered all over in a rash and welts"  ? Bee Venom Swelling and Other (See Comments)  ?  Swelling at site where stung  ? Terconazole Nausea Only and Other (See Comments)  ?  Headaches, also  ? ? ?Past Medical History:  ?Diagnosis Date  ? Allergic rhinitis, cause unspecified   ? Anemia   ? Arthritis   ? Cellulitis   ? Chest discomfort   ? COPD, questioned   ? GAVE (gastric antral vascular ectasia)   ? Hyponatremia   ? MITRAL VALVE PROLAPSE, HX OF   ? Other chronic sinusitis   ? Palpitations   ? Syncope   ? Unspecified hearing loss   ? ? ?Past Medical History, Surgical history, Social history, and Family history were reviewed and updated as appropriate.  ? ?Please see review of systems for further details on the patient's review from today.  ? ?Objective:  ? ?Physical Exam:  ?There were no vitals taken for this visit. ? ?Physical Exam ?Constitutional:   ?   General: She is not in acute distress. ?Musculoskeletal:     ?   General: No deformity.  ?Neurological:  ?   Mental Status: She is alert and oriented to person, place, and time.  ?   Coordination: Coordination normal.  ?Psychiatric:     ?   Attention and Perception: Attention and perception normal. She does not perceive  auditory or visual hallucinations.     ?   Mood and Affect: Mood is anxious. Mood is not depressed. Affect is not labile, blunt, angry or inappropriate.     ?   Speech: Speech normal. Speech is not slurred.     ?   Behavior: Behavior normal.     ?   Thought Content: Thought content normal. Thought content is not paranoid or delusional. Thought content does not include homicidal or suicidal ideation. Thought content does not include suicidal plan.     ?   Cognition and Memory: Cognition and memory normal.     ?   Judgment: Judgment normal.  ?   Comments: Insight intact  ? ? ?Lab Review:  ?   ?Component Value Date/Time  ? NA 131 (L) 11/17/2021 0009  ? K 3.7 11/17/2021 0009  ?  CL 102 11/17/2021 0009  ? CO2 23 11/17/2021 0009  ? GLUCOSE 125 (H) 11/17/2021 0009  ? BUN 20 11/17/2021 0009  ? CREATININE 0.78 11/17/2021 0009  ? CALCIUM 8.4 (L) 11/17/2021 0009  ? PROT 5.9 (L) 11/17/2021 0009  ? ALBUMIN 3.0 (L) 11/17/2021 0009  ? AST 11 (L) 11/17/2021 0009  ? ALT 14 11/17/2021 0009  ? ALKPHOS 35 (L) 11/17/2021 0009  ? BILITOT 0.5 11/17/2021 0009  ? GFRNONAA >60 11/17/2021 0009  ? GFRAA >90 09/09/2013 0617  ? ? ?   ?Component Value Date/Time  ? WBC 9.7 11/17/2021 0009  ? RBC 3.91 11/17/2021 0009  ? HGB 11.3 (L) 11/17/2021 0009  ? HCT 34.2 (L) 11/17/2021 0009  ? PLT 211 11/17/2021 0009  ? MCV 87.5 11/17/2021 0009  ? MCH 28.9 11/17/2021 0009  ? MCHC 33.0 11/17/2021 0009  ? RDW 13.2 11/17/2021 0009  ? LYMPHSABS 1.8 11/17/2021 0009  ? MONOABS 0.9 11/17/2021 0009  ? EOSABS 0.1 11/17/2021 0009  ? BASOSABS 0.0 11/17/2021 0009  ? ? ?No results found for: POCLITH, LITHIUM  ? ?No results found for: PHENYTOIN, PHENOBARB, VALPROATE, CBMZ  ? ?.res ?Assessment: Plan:   ? ?Jadasia was seen today for follow-up, generalized anxiety disorder and sleeping problem. ? ?Diagnoses and all orders for this visit: ? ?Generalized anxiety disorder ?-     LORazepam (ATIVAN) 0.5 MG tablet; Take 0.5 tablets (0.25 mg total) by mouth every 8 (eight) hours as  needed for anxiety. ? ?Insomnia due to mental condition ? ?  ?Been able to wean off meds but insomnia and SE very med sensitive. ?Been able to wean off lorazepam and anxiety controlled.  Still sleep issues ha

## 2021-11-23 ENCOUNTER — Ambulatory Visit: Payer: Medicare Other | Admitting: Orthopedic Surgery

## 2021-11-23 DIAGNOSIS — B958 Unspecified staphylococcus as the cause of diseases classified elsewhere: Secondary | ICD-10-CM | POA: Diagnosis not present

## 2021-11-23 DIAGNOSIS — A6000 Herpesviral infection of urogenital system, unspecified: Secondary | ICD-10-CM | POA: Diagnosis not present

## 2021-11-23 DIAGNOSIS — L089 Local infection of the skin and subcutaneous tissue, unspecified: Secondary | ICD-10-CM | POA: Diagnosis not present

## 2021-11-23 DIAGNOSIS — R55 Syncope and collapse: Secondary | ICD-10-CM | POA: Diagnosis not present

## 2021-11-28 DIAGNOSIS — R7989 Other specified abnormal findings of blood chemistry: Secondary | ICD-10-CM | POA: Diagnosis not present

## 2021-11-28 DIAGNOSIS — I340 Nonrheumatic mitral (valve) insufficiency: Secondary | ICD-10-CM | POA: Diagnosis not present

## 2021-11-28 DIAGNOSIS — E559 Vitamin D deficiency, unspecified: Secondary | ICD-10-CM | POA: Diagnosis not present

## 2021-11-30 ENCOUNTER — Ambulatory Visit (INDEPENDENT_AMBULATORY_CARE_PROVIDER_SITE_OTHER): Payer: Medicare Other | Admitting: Orthopedic Surgery

## 2021-11-30 DIAGNOSIS — M1711 Unilateral primary osteoarthritis, right knee: Secondary | ICD-10-CM | POA: Diagnosis not present

## 2021-12-01 ENCOUNTER — Encounter: Payer: Self-pay | Admitting: Orthopedic Surgery

## 2021-12-01 NOTE — Progress Notes (Signed)
? ?Office Visit Note ?  ?Patient: Haley Wade           ?Date of Birth: 1945/09/13           ?MRN: [id number] ?Visit Date: 11/30/2021 ?Requested by: Shon Baton, MD ?[address] ?[place],  [place] [address] ?PCP: Shon Baton, MD ? ?Subjective: ?Chief Complaint  ?Patient presents with  ? Right Knee - Follow-up  ? ? ?HPI: Gerline is a 76 year old patient with right knee pain.  She had 1 Euflexxa injection and developed either an allergic reaction to it or a pseudogout flare.  She stayed in the hospital 2 days for that event.  I aspirated the knee in the hospital.  That did show pseudogout crystals.  Overall the knee is feeling better.  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 ?             ?ROS: All systems reviewed are negative as they relate to the chief complaint within the history of present illness.  Patient denies  fevers or chills. ? ? ?Assessment & Plan: ?Visit Diagnoses:  ?1. Unilateral primary osteoarthritis, right knee   ? ? ?Plan: Impression is pseudogout right knee with mild arthritis.  No effusion today and patient is very functional.  Hold off on further gel injections for now.  Could consider cortisone injections to or at most 3 times a year as needed for symptom management.  From an arthritis standpoint it does not look too bad in the knee so I think we should be able to manage this with injections moving forward.  Follow-up as needed ? ?Follow-Up Instructions: Return if symptoms worsen or fail to improve.  ? ?Orders:  ?No orders of the defined types were placed in this encounter. ? ?No orders of the defined types were placed in this encounter. ? ? ? ? Procedures: ?No procedures performed ? ? ?Clinical Data: ?No additional findings. ? ?Objective: ?Vital Signs: There were no vitals taken for this visit. ? ?Physical Exam:  ? ?Constitutional: Patient appears well-developed ?HEENT:  ?Head: Normocephalic ?Eyes:EOM are normal ?Neck: Normal range of motion ?Cardiovascular: Normal  rate ?Pulmonary/chest: Effort normal ?Neurologic: Patient is alert ?Skin: Skin is warm ?Psychiatric: Patient has normal mood and affect ? ? ?Ortho Exam: Ortho exam demonstrates full active and passive range of motion of the right knee with stable collateral cruciate ligaments.  No warmth to the knee.  No groin pain with internal/external Tatian of the leg.  No other masses lymphadenopathy or skin changes noted in that knee region. ? ?Specialty Comments:  ?No specialty comments available. ? ?Imaging: ?No results found. ? ? ?PMFS History: ?Patient Active Problem List  ? Diagnosis Date Noted  ? Chronic hyponatremia 11/17/2021  ? Generalized weakness 11/17/2021  ? GAD (generalized anxiety disorder) 11/17/2021  ? Syncope 11/17/2021  ? 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 Inflammatory arthritis 11/16/2021  ? Complete tear of right rotator cuff   ? Biceps tendonitis on right   ? S/P rotator cuff repair 02/08/2021  ? Chest discomfort 01/26/2014  ? Palpitations 01/26/2014  ? Cellulitis 09/08/2013  ? Abdominal pain, left mid abdomen, chronic 02/26/2013  ? Hyponatremia 10/30/2012  ? COPD, questioned 01/02/2012  ? Allergic rhinitis due to pollen 10/20/2010  ? MITRAL VALVE PROLAPSE, HX OF 05/26/2009  ? UNSPECIFIED HEARING LOSS 05/21/2009  ? 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 ?Past Medical History:  ?Diagnosis Date  ? Allergic rhinitis, cause unspecified   ? Anemia   ? Arthritis   ?  Cellulitis   ? Chest discomfort   ? COPD, questioned   ? GAVE (gastric antral vascular ectasia)   ? Hyponatremia   ? MITRAL VALVE PROLAPSE, HX OF   ? Other chronic sinusitis   ? Palpitations   ? Syncope   ? Unspecified hearing loss   ?  ?Family History  ?Problem Relation Age of Onset  ? Other Mother   ?     c difficile gastroenteritis  ? Lung cancer Father   ?     smoker  ? Liver disease Brother   ? Liver cancer Brother   ? Asthma Other   ?     grandmother  ? Other Other   ?     bronchitis-grandmother  ?  ?Past Surgical History:   ?Procedure Laterality Date  ? APPENDECTOMY    ? BACK SURGERY    ? BICEPT TENODESIS Right 02/08/2021  ? Procedure: BICEPS TENODESIS;  Surgeon: Meredith Pel, MD;  Location: [facility];  Service: Orthopedics;  Laterality: Right;  ? BLADDER SURGERY    ? BOWEL RESECTION N/A 11/01/2012  ? Procedure: SMALL BOWEL RESECTION;  Surgeon: Earnstine Regal, MD;  Location: [facility];  Service: General;  Laterality: N/A;  ? BREAST ENHANCEMENT SURGERY    ? revisions  ? BREAST SURGERY    ? CHOLECYSTECTOMY    ? COLON SURGERY    ? ESOPHAGOGASTRODUODENOSCOPY (EGD) WITH PROPOFOL N/A 10/14/2019  ? Procedure: ESOPHAGOGASTRODUODENOSCOPY (EGD) WITH PROPOFOL;  Surgeon: Clarene Essex, MD;  Location: [facility];  Service: Endoscopy;  Laterality: N/A;  ? EYE SURGERY    ? GI RADIOFREQUENCY ABLATION  10/14/2019  ? Procedure: GI RADIOFREQUENCY ABLATION;  Surgeon: Clarene Essex, MD;  Location: [facility];  Service: Endoscopy;;  ? LAPAROSCOPY N/A 11/01/2012  ? Procedure: LAPAROSCOPY DIAGNOSTIC;  Surgeon: Earnstine Regal, MD;  Location: [facility];  Service: General;  Laterality: N/A;  ? LAPAROTOMY N/A 11/01/2012  ? Procedure: EXPLORATORY LAPAROTOMY;  Surgeon: Earnstine Regal, MD;  Location: [facility];  Service: General;  Laterality: N/A;  ? LYSIS OF ADHESION N/A 11/01/2012  ? Procedure: LYSIS OF ADHESION;  Surgeon: Earnstine Regal, MD;  Location: [facility];  Service: General;  Laterality: N/A;  ? RHINOPLASTY    ? SHOULDER ARTHROSCOPY WITH OPEN ROTATOR CUFF REPAIR AND DISTAL CLAVICLE ACROMINECTOMY Right 02/08/2021  ? Procedure: SHOULDER ARTHROSCOPY WITH BICEPS TENDON RELEASE, MINI OPEN ROTATOR CUFF TEAR REPAIR OF THE INFRASPINATUS SUPRASPINATUS AND UPPER PORTIION OF THE SUBSCAP  WITH BICEPS TENODESIS;  Surgeon: Meredith Pel, MD;  Location: [facility];  Service: Orthopedics;  Laterality: Right;  ? TONSILLECTOMY    ? TOTAL ABDOMINAL HYSTERECTOMY    ? ?Social History  ? ?Occupational History  ? Occupation: Investment banker, [employer]  ?Tobacco  Use  ? Smoking status: Former  ?  Types: Cigarettes  ?  Quit date: 08/14/1980  ?  Years since quitting: 41.3  ? Smokeless tobacco: Never  ?Substance and Sexual Activity  ? Alcohol use: No  ? Drug use: No  ? Sexual activity: Not on file  ? ? ? ? ? ?

## 2021-12-05 DIAGNOSIS — H8109 Meniere's disease, unspecified ear: Secondary | ICD-10-CM | POA: Diagnosis not present

## 2021-12-05 DIAGNOSIS — Z0000 Encounter for general adult medical examination without abnormal findings: Secondary | ICD-10-CM | POA: Diagnosis not present

## 2021-12-05 DIAGNOSIS — K31819 Angiodysplasia of stomach and duodenum without bleeding: Secondary | ICD-10-CM | POA: Diagnosis not present

## 2021-12-05 DIAGNOSIS — R82998 Other abnormal findings in urine: Secondary | ICD-10-CM | POA: Diagnosis not present

## 2021-12-05 DIAGNOSIS — M8580 Other specified disorders of bone density and structure, unspecified site: Secondary | ICD-10-CM | POA: Diagnosis not present

## 2021-12-05 DIAGNOSIS — M5380 Other specified dorsopathies, site unspecified: Secondary | ICD-10-CM | POA: Diagnosis not present

## 2021-12-05 DIAGNOSIS — Z1331 Encounter for screening for depression: Secondary | ICD-10-CM | POA: Diagnosis not present

## 2021-12-05 DIAGNOSIS — Z1389 Encounter for screening for other disorder: Secondary | ICD-10-CM | POA: Diagnosis not present

## 2021-12-05 DIAGNOSIS — M79643 Pain in unspecified hand: Secondary | ICD-10-CM | POA: Diagnosis not present

## 2021-12-05 DIAGNOSIS — B001 Herpesviral vesicular dermatitis: Secondary | ICD-10-CM | POA: Diagnosis not present

## 2021-12-05 DIAGNOSIS — K5900 Constipation, unspecified: Secondary | ICD-10-CM | POA: Diagnosis not present

## 2021-12-05 DIAGNOSIS — D649 Anemia, unspecified: Secondary | ICD-10-CM | POA: Diagnosis not present

## 2021-12-05 DIAGNOSIS — R202 Paresthesia of skin: Secondary | ICD-10-CM | POA: Diagnosis not present

## 2021-12-05 DIAGNOSIS — R002 Palpitations: Secondary | ICD-10-CM | POA: Diagnosis not present

## 2021-12-05 DIAGNOSIS — M154 Erosive (osteo)arthritis: Secondary | ICD-10-CM | POA: Diagnosis not present

## 2021-12-07 DIAGNOSIS — B0089 Other herpesviral infection: Secondary | ICD-10-CM | POA: Diagnosis not present

## 2021-12-07 DIAGNOSIS — L218 Other seborrheic dermatitis: Secondary | ICD-10-CM | POA: Diagnosis not present

## 2021-12-07 DIAGNOSIS — L720 Epidermal cyst: Secondary | ICD-10-CM | POA: Diagnosis not present

## 2021-12-08 DIAGNOSIS — M79641 Pain in right hand: Secondary | ICD-10-CM | POA: Diagnosis not present

## 2021-12-08 DIAGNOSIS — M19041 Primary osteoarthritis, right hand: Secondary | ICD-10-CM | POA: Diagnosis not present

## 2021-12-12 DIAGNOSIS — K31819 Angiodysplasia of stomach and duodenum without bleeding: Secondary | ICD-10-CM | POA: Diagnosis not present

## 2021-12-12 DIAGNOSIS — R42 Dizziness and giddiness: Secondary | ICD-10-CM | POA: Diagnosis not present

## 2021-12-12 DIAGNOSIS — E559 Vitamin D deficiency, unspecified: Secondary | ICD-10-CM | POA: Diagnosis not present

## 2021-12-12 DIAGNOSIS — K219 Gastro-esophageal reflux disease without esophagitis: Secondary | ICD-10-CM | POA: Diagnosis not present

## 2021-12-12 DIAGNOSIS — F419 Anxiety disorder, unspecified: Secondary | ICD-10-CM | POA: Diagnosis not present

## 2021-12-23 DIAGNOSIS — L219 Seborrheic dermatitis, unspecified: Secondary | ICD-10-CM | POA: Diagnosis not present

## 2021-12-23 DIAGNOSIS — L650 Telogen effluvium: Secondary | ICD-10-CM | POA: Diagnosis not present

## 2022-01-23 ENCOUNTER — Emergency Department (HOSPITAL_BASED_OUTPATIENT_CLINIC_OR_DEPARTMENT_OTHER)
Admission: EM | Admit: 2022-01-23 | Discharge: 2022-01-23 | Disposition: A | Discharge status: Home / Self Care | Payer: Medicare Other | Attending: Emergency Medicine | Admitting: Emergency Medicine

## 2022-01-23 ENCOUNTER — Emergency Department (HOSPITAL_BASED_OUTPATIENT_CLINIC_OR_DEPARTMENT_OTHER): Payer: Medicare Other | Admitting: Radiology

## 2022-01-23 ENCOUNTER — Encounter (HOSPITAL_BASED_OUTPATIENT_CLINIC_OR_DEPARTMENT_OTHER): Payer: Self-pay

## 2022-01-23 ENCOUNTER — Other Ambulatory Visit: Payer: Self-pay

## 2022-01-23 ENCOUNTER — Other Ambulatory Visit (HOSPITAL_BASED_OUTPATIENT_CLINIC_OR_DEPARTMENT_OTHER): Payer: Self-pay

## 2022-01-23 DIAGNOSIS — I10 Essential (primary) hypertension: Secondary | ICD-10-CM | POA: Diagnosis not present

## 2022-01-23 DIAGNOSIS — Z79899 Other long term (current) drug therapy: Secondary | ICD-10-CM | POA: Diagnosis not present

## 2022-01-23 DIAGNOSIS — M25532 Pain in left wrist: Secondary | ICD-10-CM | POA: Diagnosis present

## 2022-01-23 DIAGNOSIS — M109 Gout, unspecified: Secondary | ICD-10-CM | POA: Diagnosis not present

## 2022-01-23 DIAGNOSIS — M11232 Other chondrocalcinosis, left wrist: Secondary | ICD-10-CM

## 2022-01-23 DIAGNOSIS — L209 Atopic dermatitis, unspecified: Secondary | ICD-10-CM | POA: Diagnosis not present

## 2022-01-23 LAB — CBC WITH DIFFERENTIAL/PLATELET
Abs Immature Granulocytes: 0.04 10*3/uL (ref 0.00–0.07)
Basophils Absolute: 0 10*3/uL (ref 0.0–0.1)
Basophils Relative: 1 %
Eosinophils Absolute: 0.1 10*3/uL (ref 0.0–0.5)
Eosinophils Relative: 1 %
HCT: 38.4 % (ref 36.0–46.0)
Hemoglobin: 12.5 g/dL (ref 12.0–15.0)
Immature Granulocytes: 1 %
Lymphocytes Relative: 19 %
Lymphs Abs: 1.7 10*3/uL (ref 0.7–4.0)
MCH: 28.6 pg (ref 26.0–34.0)
MCHC: 32.6 g/dL (ref 30.0–36.0)
MCV: 87.9 fL (ref 80.0–100.0)
Monocytes Absolute: 0.7 10*3/uL (ref 0.1–1.0)
Monocytes Relative: 7 %
Neutro Abs: 6.4 10*3/uL (ref 1.7–7.7)
Neutrophils Relative %: 71 %
Platelets: 255 10*3/uL (ref 150–400)
RBC: 4.37 MIL/uL (ref 3.87–5.11)
RDW: 13.4 % (ref 11.5–15.5)
WBC: 8.9 10*3/uL (ref 4.0–10.5)
nRBC: 0 % (ref 0.0–0.2)

## 2022-01-23 LAB — BASIC METABOLIC PANEL
Anion gap: 10 (ref 5–15)
BUN: 24 mg/dL — ABNORMAL HIGH (ref 8–23)
CO2: 24 mmol/L (ref 22–32)
Calcium: 9.5 mg/dL (ref 8.9–10.3)
Chloride: 101 mmol/L (ref 98–111)
Creatinine, Ser: 0.64 mg/dL (ref 0.44–1.00)
GFR, Estimated: >60 mL/min (ref >60)
Glucose, Bld: 93 mg/dL (ref 70–99)
Potassium: 5 mmol/L (ref 3.5–5.1)
Sodium: 135 mmol/L (ref 135–145)

## 2022-01-23 LAB — SEDIMENTATION RATE: Sed Rate: 22 mm/hr (ref 0–22)

## 2022-01-23 LAB — C-REACTIVE PROTEIN: CRP: 0.6 mg/dL (ref <1.0)

## 2022-01-23 MED ORDER — METHYLPREDNISOLONE 4 MG PO TBPK
ORAL_TABLET | ORAL | 0 refills | Status: DC
  Filled 2022-01-23: qty 21, 6d supply, fill #0

## 2022-01-23 MED ORDER — INDOMETHACIN 50 MG PO CAPS
50.0000 mg | ORAL_CAPSULE | Freq: Three times a day (TID) | ORAL | 0 refills | Status: AC
  Filled 2022-01-23: qty 15, 5d supply, fill #0

## 2022-01-23 MED ORDER — PREDNISONE 10 MG PO TABS
10.0000 mg | ORAL_TABLET | Freq: Every day | ORAL | 0 refills | Status: DC
  Filled 2022-01-23: qty 5, 5d supply, fill #0

## 2022-01-23 MED ORDER — IBUPROFEN 400 MG PO TABS
600.0000 mg | ORAL_TABLET | Freq: Once | ORAL | Status: AC
  Administered 2022-01-23: 600 mg via ORAL
  Filled 2022-01-23: qty 1

## 2022-01-23 NOTE — ED Provider Notes (Addendum)
[facility] EMERGENCY DEPT Provider Note   CSN: [id number] Arrival date & time: 01/23/22  2979     History  Chief Complaint  Patient presents with   Hand Pain    JESELLE HISER is a 76 y.o. female.   Hand Pain    76 year old female with medical history significant for osteoarthritis of the right knee, chronic hyponatremia, GAD, HTN presenting to the emergency department with left wrist pain.  The patient states that she been carrying some heavy bags and developed pain and swelling along the dorsum of the right wrist.  She denies any wrist joint swelling.  HEENT she endorses pain and redness that has extended towards the dorsum of her hands.  Thumb range of motion attempts do cause pain.  She states that she was recently diagnosed with pseudogout in the knee.  She had a hospitalization to evaluate for possible septic arthritis in the knee.  Arthrocentesis revealed synovial white blood cells to 29,000 at that time with crystals consistent with pseudogout.  Home Medications Prior to Admission medications   Medication Sig Start Date End Date Taking? Authorizing Provider  indomethacin (INDOCIN) 50 MG capsule Take 1 capsule (50 mg total) by mouth 3 (three) times daily with meals for 5 days. 01/23/22 01/28/22 Yes Regan Lemming, MD  methylPREDNISolone (MEDROL DOSEPAK) 4 MG TBPK tablet Take as prescribed on the package 01/23/22  Yes Regan Lemming, MD  acyclovir (ZOVIRAX) 200 MG capsule Take 200 mg by mouth daily.    [provider]  estrogens, conjugated, (PREMARIN) 0.625 MG tablet Take 0.625 mg by mouth daily.     [provider]  FERREX 150 150 MG capsule Take 150 mg by mouth daily with breakfast.    [provider]  finasteride (PROSCAR) 5 MG tablet Take 5 mg by mouth daily.    [provider]  fluticasone (FLONASE) 50 MCG/ACT nasal spray Place 2 sprays into both nostrils daily as needed for allergies or rhinitis.    [provider]  HYDROcodone-acetaminophen (NORCO/VICODIN) 5-325 MG tablet Take 1 tablet by mouth every 6 (six) hours as needed for moderate pain. 02/12/21   Meredith Pel, MD  ibuprofen (ADVIL) 800 MG tablet Take 200-800 mg by mouth every 8 (eight) hours as needed (for pain). 12/17/20   [provider]  LORazepam (ATIVAN) 0.5 MG tablet Take 0.5 tablets (0.25 mg total) by mouth every 8 (eight) hours as needed for anxiety. 11/21/21   Cottle, Billey Co., MD  Methen-Bella-Meth Bl-Phen Sal (URISED PO) Take 1 tablet by mouth daily as needed (for bladder spasms).    [provider]  metoprolol succinate (TOPROL XL) 25 MG 24 hr tablet Take 0.5 tablets (12.5 mg total) by mouth daily. Patient not taking: Reported on 11/17/2021 11/01/20   Belva Crome, MD  omeprazole (PRILOSEC) 20 MG capsule Take 20 mg by mouth daily as needed (for reflux).    [provider]  Polyethyl Glycol-Propyl Glycol (SYSTANE ULTRA) 0.4-0.3 % SOLN Place 1 drop into both eyes 3 (three) times daily as needed (dry eyes).    [provider]  spironolactone (ALDACTONE) 50 MG tablet Take 1 tablet (50 mg total) by mouth daily. Patient taking differently: Take 50 mg by mouth 3 (three) times daily. 09/09/13   Shon Baton, MD  traZODone (DESYREL) 50 MG tablet Take 1 tablet (50 mg total) by mouth at bedtime as needed for sleep. 07/04/21   Cottle, Billey Co., MD  Vitamin D, Ergocalciferol, (DRISDOL) 1.25  MG (50000 UNIT) CAPS capsule Take 50,000 Units by mouth every Saturday.    [provider]  zaleplon (SONATA) 10 MG capsule TAKE ONE CAPSULE AT BEDTIME AS NEEDED FOR SLEEP Patient taking differently: Take 10 mg by mouth at bedtime as needed. 11/02/21   Cottle, Billey Co., MD      Allergies    Plaquenil [hydroxychloroquine sulfate], Bee venom, and Terconazole    Review of Systems   Review of Systems  All other systems reviewed and are negative.   Physical Exam Updated Vital Signs BP (!) 140/93   Pulse 72    Temp 97.9 F (36.6 C) (Oral)   Resp 18   Ht 4' 11"  (1.499 m)   Wt 43.5 kg   SpO2 100%   BMI 19.39 kg/m  Physical Exam Vitals and nursing note reviewed.  Constitutional:      General: She is not in acute distress. HENT:     Head: Normocephalic and atraumatic.     Comments: Dermatitis along the patient's scalp, no weeping or significant lesions present Eyes:     Conjunctiva/sclera: Conjunctivae normal.     Pupils: Pupils are equal, round, and reactive to light.  Cardiovascular:     Rate and Rhythm: Normal rate and regular rhythm.  Pulmonary:     Effort: Pulmonary effort is normal. No respiratory distress.  Abdominal:     General: There is no distension.     Tenderness: There is no guarding.  Musculoskeletal:        General: Tenderness present. No deformity or signs of injury.     Cervical back: Neck supple.     Comments: Left wrist, 2+ radial pulses, erythema, tenderness to palpation, mild swelling along the dorsum of the wrist extending to the dorsum of the hand.  Some pain with range of motion of the wrist.  Skin:    Findings: No lesion or rash.  Neurological:     General: No focal deficit present.     Mental Status: She is alert. Mental status is at baseline.     ED Results / Procedures / Treatments   Labs (all labs ordered are listed, but only abnormal results are displayed) Labs Reviewed  BASIC METABOLIC PANEL - Abnormal; Notable for the following components:      Result Value   BUN 24 (*)    All other components within normal limits  CBC WITH DIFFERENTIAL/PLATELET  SEDIMENTATION RATE  C-REACTIVE PROTEIN    EKG None  Radiology DG Wrist Complete Left  Result Date: 01/23/2022 CLINICAL DATA:  Wrist EXAM: LEFT WRIST - COMPLETE 3+ VIEW COMPARISON:  None Available. FINDINGS: There is a well corticated bony fragment along the dorsal aspect of the wrist. There is adjacent soft tissue swelling there is radiocarpal joint space narrowing and chondrocalcinosis. There  is also chondrocalcinosis at the third MCP joint. There is mild base of thumb osteoarthritis. IMPRESSION: Well corticated bony fragment along the dorsal aspect of the wrist, appearance favoring a chronic triquetral injury, though there is adjacent soft tissue swelling along the dorsal wrist. 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 CPPD arthropathy could account for wrist swelling. Mild base of thumb arthritis. Electronically Signed   By: Maurine Simmering M.D.   On: 01/23/2022 08:00    Procedures Procedures    Medications Ordered in ED Medications  ibuprofen (ADVIL) tablet 600 mg (600 mg Oral Given 01/23/22 0746)  ED Course/ Medical Decision Making/ A&P                           Medical Decision Making Amount and/or Complexity of Data Reviewed Labs: ordered. Radiology: ordered.  Risk Prescription drug management.   76 year old female with medical history significant for osteoarthritis of the right knee, chronic hyponatremia, GAD, HTN presenting to the emergency department with left wrist pain.  The patient states that she been carrying some heavy bags and developed pain and swelling along the dorsum of the right wrist.  She denies any wrist joint swelling.  HEENT she endorses pain and redness that has extended towards the dorsum of her hands.  Thumb range of motion attempts do cause pain.  She states that she was recently diagnosed with pseudogout in the knee.  She had a hospitalization to evaluate for possible septic arthritis in the knee.  Arthrocentesis revealed synovial white blood cells to 29,000 at that time with crystals consistent with pseudogout.  On arrival, the patient was vitally stable.  Physical exam significant for left wrist pain, redness, mild swelling with pain with range of motion of the wrist.  Has a history of pseudogout with concern for pseudogout flare.  Additional  consideration given to septic arthritis versus fracture although the patient denies traumatic injury.  X-ray imaging of the wrist performed and results are as follows, interpreted by myself concerning for calcium deposition in the joint and arthritis and radiology: FINDINGS:  There is a well corticated bony fragment along the dorsal aspect of  the wrist. There is adjacent soft tissue swelling there is  radiocarpal joint space narrowing and chondrocalcinosis. There is  also chondrocalcinosis at the third MCP joint. There is mild base of  thumb osteoarthritis.    IMPRESSION:  Well corticated bony fragment along the dorsal aspect of the wrist,  appearance favoring a chronic triquetral injury, though there is  adjacent soft tissue swelling along the dorsal wrist.    Mild to moderate radiocarpal arthritis with chondrocalcinosis  throughout the wrist as well as at the third MCP joint, can be seen  in CPPD arthropathy. CPPD arthropathy could account for wrist  swelling.    Mild base of thumb arthritis.  Laboratory evaluation significant for CBC without a leukocytosis or anemia, ESR normal, BMP unremarkable with mildly elevated BUN to 24.  I discussed the patient's x-ray findings and her recent diagnosis.  She was administered ibuprofen.  I discussed the likelihood of pseudogout flare given her recent diagnosis after hospitalization and proven findings of pseudogout crystals on arthrocentesis of the knee. We discussed the risks and benefits of arthrocentesis of the wrist and given the patient's x-ray findings and recent diagnosis, will defer for evaluation of septic arthritis does feel this is significantly less likely at this time. Low concern for cellulitis. I discussed with the patient return precautions in the event of worsening symptoms despite steroid and NSAID use.  Overall stable for discharge.  Final Clinical Impression(s) / ED Diagnoses Final diagnoses:  Pseudogout of left wrist  Atopic  dermatitis of scalp    Rx / DC Orders ED Discharge Orders          Ordered    indomethacin (INDOCIN) 50 MG capsule  3 times daily with meals        01/23/22 0854    predniSONE (DELTASONE) 10 MG tablet  Daily,   Status:  Discontinued        01/23/22 0947  methylPREDNISolone (MEDROL DOSEPAK) 4 MG TBPK tablet        01/23/22 0901              Regan Lemming, MD 01/23/22 8805    Regan Lemming, MD 01/23/22 [phone]

## 2022-01-23 NOTE — ED Triage Notes (Signed)
POV, pt c/o left wrist pain that started on Friday with a bump to the wrist, sts that yesterday swelling, pain and redness worsened. Amb, A&O x4

## 2022-01-23 NOTE — Discharge Instructions (Addendum)
Your XR imaging revealed the following: IMPRESSION:  Well corticated bony fragment along the dorsal aspect of the wrist,  appearance favoring a chronic triquetral injury, though there is  adjacent soft tissue swelling along the dorsal wrist.    Mild to moderate radiocarpal arthritis with chondrocalcinosis  throughout the wrist as well as at the third MCP joint, can be seen  in CPPD arthropathy. CPPD arthropathy could account for wrist  swelling.    Mild base of thumb arthritis.   Given your recent diagnosis of pseudogout, this is the most likely cause of your presentation today. Recommend steroids and NSAIDs for the next few days and PCP follow-up

## 2022-02-15 DIAGNOSIS — N952 Postmenopausal atrophic vaginitis: Secondary | ICD-10-CM | POA: Diagnosis not present

## 2022-02-15 DIAGNOSIS — R102 Pelvic and perineal pain: Secondary | ICD-10-CM | POA: Diagnosis not present

## 2022-02-15 NOTE — Progress Notes (Signed)
Cardiology Office Note:    Date:  02/17/2022   ID:  Haley Wade, DOB 09-06-1945, MRN [id number]  PCP:  Shon Baton, MD  Cardiologist:  Sinclair Grooms, MD   Referring MD: Shon Baton, MD   Chief Complaint  Patient presents with   Irregular Heart Beat    History of Present Illness:    Haley Wade is a 76 y.o. female with a hx of  sensorineural hearing loss,  IRBBB, GERD, mitral regurgitation, family h/o cancer, who presents with palpitations, SVT and syncope. Has h/o epigastric pain.   She feels worse on Toprol-XL then off.  It causes bloating.  Palpitations have not been an issue.  She is only on 12-1/2 mg of Toprol-XL daily.  No peripheral edema.  No syncope.  No prolonged episodes of palpitation.  Past Medical History:  Diagnosis Date   Allergic rhinitis, cause unspecified    Anemia    Arthritis    Cellulitis    Chest discomfort    COPD, questioned    GAVE (gastric antral vascular ectasia)    Hyponatremia    MITRAL VALVE PROLAPSE, HX OF    Other chronic sinusitis    Palpitations    Syncope    Unspecified hearing loss     Past Surgical History:  Procedure Laterality Date   APPENDECTOMY     BACK SURGERY     BICEPT TENODESIS Right 02/08/2021   Procedure: BICEPS TENODESIS;  Surgeon: Meredith Pel, MD;  Location: [facility];  Service: Orthopedics;  Laterality: Right;   BLADDER SURGERY     BOWEL RESECTION N/A 11/01/2012   Procedure: SMALL BOWEL RESECTION;  Surgeon: Earnstine Regal, MD;  Location: [facility];  Service: General;  Laterality: N/A;   BREAST ENHANCEMENT SURGERY     revisions   BREAST SURGERY     CHOLECYSTECTOMY     COLON SURGERY     ESOPHAGOGASTRODUODENOSCOPY (EGD) WITH PROPOFOL N/A 10/14/2019   Procedure: ESOPHAGOGASTRODUODENOSCOPY (EGD) WITH PROPOFOL;  Surgeon: Clarene Essex, MD;  Location: [facility];  Service: Endoscopy;  Laterality: N/A;   EYE SURGERY     GI RADIOFREQUENCY ABLATION  10/14/2019   Procedure: GI RADIOFREQUENCY ABLATION;   Surgeon: Clarene Essex, MD;  Location: [facility];  Service: Endoscopy;;   LAPAROSCOPY N/A 11/01/2012   Procedure: LAPAROSCOPY DIAGNOSTIC;  Surgeon: Earnstine Regal, MD;  Location: [facility];  Service: General;  Laterality: N/A;   LAPAROTOMY N/A 11/01/2012   Procedure: EXPLORATORY LAPAROTOMY;  Surgeon: Earnstine Regal, MD;  Location: [facility];  Service: General;  Laterality: N/A;   LYSIS OF ADHESION N/A 11/01/2012   Procedure: LYSIS OF ADHESION;  Surgeon: Earnstine Regal, MD;  Location: [facility];  Service: General;  Laterality: N/A;   RHINOPLASTY     SHOULDER ARTHROSCOPY WITH OPEN ROTATOR CUFF REPAIR AND DISTAL CLAVICLE ACROMINECTOMY Right 02/08/2021   Procedure: SHOULDER ARTHROSCOPY WITH BICEPS TENDON RELEASE, MINI OPEN ROTATOR CUFF TEAR REPAIR OF THE INFRASPINATUS SUPRASPINATUS AND UPPER PORTIION OF THE SUBSCAP  WITH BICEPS TENODESIS;  Surgeon: Meredith Pel, MD;  Location: [facility];  Service: Orthopedics;  Laterality: Right;   TONSILLECTOMY     TOTAL ABDOMINAL HYSTERECTOMY      Current Medications: Current Meds  Medication Sig   acyclovir (ZOVIRAX) 200 MG capsule Take 200 mg by mouth daily.   estrogens, conjugated, (PREMARIN) 0.625 MG tablet Take 0.625 mg by mouth daily.    FERREX 150 150 MG capsule Take 150 mg by mouth daily with breakfast.  finasteride (PROSCAR) 5 MG tablet Take 5 mg by mouth daily.   fluconazole (DIFLUCAN) 150 MG tablet Take 150 mg by mouth daily.   fluticasone (FLONASE) 50 MCG/ACT nasal spray Place 2 sprays into both nostrils daily as needed for allergies or rhinitis.   ibuprofen (ADVIL) 800 MG tablet Take 200-800 mg by mouth every 8 (eight) hours as needed (for pain).   Methen-Bella-Meth Bl-Phen Sal (URISED PO) Take 1 tablet by mouth daily as needed (for bladder spasms).   omeprazole (PRILOSEC) 20 MG capsule Take 20 mg by mouth daily as needed (for reflux).   spironolactone (ALDACTONE) 50 MG tablet Take 1 tablet (50 mg total) by mouth daily. (Patient taking differently: Take  50 mg by mouth 3 (three) times daily.)   Vitamin D, Ergocalciferol, (DRISDOL) 1.25 MG (50000 UNIT) CAPS capsule Take 50,000 Units by mouth every Saturday.   zaleplon (SONATA) 10 MG capsule TAKE ONE CAPSULE AT BEDTIME AS NEEDED FOR SLEEP (Patient taking differently: Take 10 mg by mouth at bedtime as needed.)   [DISCONTINUED] metoprolol succinate (TOPROL XL) 25 MG 24 hr tablet Take 0.5 tablets (12.5 mg total) by mouth daily.     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  Tobacco Use   Smoking status: Former    Types: Cigarettes    Quit date: 08/14/1980    Years since quitting: 41.5   Smokeless tobacco: Never  Substance and Sexual Activity   Alcohol use: No   Drug use: No   Sexual activity: Not on file  Other Topics Concern   Not on file  Social History Narrative   Not on file   Social Determinants of Health   Financial Resource Strain: Not on file  Food Insecurity: Not on file  Transportation Needs: Not on file  Physical Activity: Not on file  Stress: Not on file  Social Connections: Not on file     Family History: The patient's family history includes Asthma in an other family member; Liver cancer in her brother; Liver disease in her brother; Lung cancer in her father; Other in her mother and another family member.  ROS:   Please see the history of present illness.    Doing well without any other complaints.  She is married Higher education careers adviser.  She is ecstatic.  She now lives at [facility].  All other systems reviewed and are negative.  EKGs/Labs/Other Studies Reviewed:    The following studies were reviewed today:  Exercised for treadmill test  2022: Study Highlights    Nuclear stress EF: 77%. There was no ST segment deviation noted during stress. No T wave inversion was noted during stress. Normal perfusion with no evidence of ischemia or infarction. The study is normal. This is a low risk study. The left ventricular ejection fraction is hyperdynamic (>65%).   Gwyndolyn Kaufman, MD   Cardiac event monitor 07/26/2020: Study Highlights  Normal sinus rhythm Isolated PVC's that correlate with complaint of flutter. Non-sustained 7 beat VT, asymptomatic No sustained arrhythmia or pauses to explain syncope  EKG:  EKG 11/2021 normal sinus rhythm, without evidence of conduction abnormality.  Normal PR.  Relatively low voltage.  Recent Labs: 11/16/2021:  TSH 3.627 11/17/2021: ALT 14; Magnesium 1.8 01/23/2022: BUN 24; Creatinine, Ser 0.64; Hemoglobin 12.5; Platelets 255; Potassium 5.0; Sodium 135  Recent Lipid Panel No results found for: "CHOL", "TRIG", "HDL", "CHOLHDL", "VLDL", "LDLCALC", "LDLDIRECT"  Physical Exam:    VS:  BP 132/82   Pulse 69   Ht '4\' 11"'$  (1.499 m)   Wt 96 lb 12.8 oz (43.9 kg)   SpO2 100%   BMI 19.55 kg/m     Wt Readings from Last 3 Encounters:  02/17/22 96 lb 12.8 oz (43.9 kg)  01/23/22 96 lb (43.5 kg)  11/16/21 96 lb (43.5 kg)     GEN: Slender. No acute distress HEENT: Normal NECK: No JVD. LYMPHATICS: No lymphadenopathy CARDIAC: No  murmur. RRR no gallop, or edema. VASCULAR:  Normal Pulses. No bruits. RESPIRATORY:  Clear to auscultation without rales, wheezing or rhonchi  ABDOMEN: Soft, non-tender, non-distended, No pulsatile mass, MUSCULOSKELETAL: No deformity  SKIN: Warm and dry NEUROLOGIC:  Alert and oriented x 3 PSYCHIATRIC:  Normal affect   ASSESSMENT:    1. SVT (supraventricular tachycardia) ([place])   2. Syncope and collapse   3. Mitral valve disease   4. Chest pain, unspecified type    PLAN:    In order of problems listed above:  Asymptomatic relative to palpitations.  Side  effects on metoprolol include bloating, fatigue, and possibly loss of hair.  Discontinue Toprol-XL.  Notify if recurrent palpitations.  If this happens we will consider diltiazem or verapamil low-dose. No episodes. No murmur. No chest pain.   Follow-up as needed.   Medication Adjustments/Labs and Tests Ordered: Current medicines are reviewed at length with the patient today.  Concerns regarding medicines are outlined above.  No orders of the defined types were placed in this encounter.  No orders of the defined types were placed in this encounter.   Patient Instructions  Medication Instructions:  Your physician has recommended you make the following change in your medication:   1) STOP Metoprolol succinate (Toprol XL)  *If you need a refill on your cardiac medications before your next appointment, please call your pharmacy*  Lab Work: NONE  Testing/Procedures: NONE  Follow-Up: At [facility], you and your health needs are our priority.  As part of our continuing mission to provide you with exceptional heart care, we have created designated Provider Care Teams.  These Care Teams include your primary Cardiologist (physician) and Advanced Practice Providers (APPs -  Physician Assistants and Nurse Practitioners) who all work together to provide you with the care you need, when you need it.  Your next appointment:   As needed  The format for your next appointment:   In Person  Provider:   Sinclair Grooms, MD {  Important Information About Sugar         Signed, Sinclair Grooms, MD  02/17/2022 8:29 AM    [place]

## 2022-02-17 ENCOUNTER — Encounter: Payer: Self-pay | Admitting: Interventional Cardiology

## 2022-02-17 ENCOUNTER — Ambulatory Visit (INDEPENDENT_AMBULATORY_CARE_PROVIDER_SITE_OTHER): Payer: Medicare Other | Admitting: Interventional Cardiology

## 2022-02-17 VITALS — BP 132/82 | HR 69 | Ht 59.0 in | Wt 96.8 lb

## 2022-02-17 DIAGNOSIS — I059 Rheumatic mitral valve disease, unspecified: Secondary | ICD-10-CM

## 2022-02-17 DIAGNOSIS — R55 Syncope and collapse: Secondary | ICD-10-CM

## 2022-02-17 DIAGNOSIS — R079 Chest pain, unspecified: Secondary | ICD-10-CM | POA: Diagnosis not present

## 2022-02-17 DIAGNOSIS — I471 Supraventricular tachycardia: Secondary | ICD-10-CM | POA: Diagnosis not present

## 2022-02-17 NOTE — Patient Instructions (Signed)
Medication Instructions:  Your physician has recommended you make the following change in your medication:   1) STOP Metoprolol succinate (Toprol XL)  *If you need a refill on your cardiac medications before your next appointment, please call your pharmacy*  Lab Work: NONE  Testing/Procedures: NONE  Follow-Up: At [facility], you and your health needs are our priority.  As part of our continuing mission to provide you with exceptional heart care, we have created designated Provider Care Teams.  These Care Teams include your primary Cardiologist (physician) and Advanced Practice Providers (APPs -  Physician Assistants and Nurse Practitioners) who all work together to provide you with the care you need, when you need it.  Your next appointment:   As needed  The format for your next appointment:   In Person  Provider:   Sinclair Grooms, MD {  Important Information About Sugar

## 2022-02-22 DIAGNOSIS — L298 Other pruritus: Secondary | ICD-10-CM | POA: Diagnosis not present

## 2022-02-22 DIAGNOSIS — L649 Androgenic alopecia, unspecified: Secondary | ICD-10-CM | POA: Diagnosis not present

## 2022-02-28 DIAGNOSIS — R300 Dysuria: Secondary | ICD-10-CM | POA: Diagnosis not present

## 2022-03-09 ENCOUNTER — Other Ambulatory Visit: Payer: Self-pay

## 2022-03-09 ENCOUNTER — Encounter (HOSPITAL_BASED_OUTPATIENT_CLINIC_OR_DEPARTMENT_OTHER): Payer: Self-pay | Admitting: Emergency Medicine

## 2022-03-09 ENCOUNTER — Emergency Department (HOSPITAL_BASED_OUTPATIENT_CLINIC_OR_DEPARTMENT_OTHER)
Admission: EM | Admit: 2022-03-09 | Discharge: 2022-03-09 | Disposition: A | Discharge status: Home / Self Care | Payer: Medicare Other | Attending: Emergency Medicine | Admitting: Emergency Medicine

## 2022-03-09 DIAGNOSIS — S6991XA Unspecified injury of right wrist, hand and finger(s), initial encounter: Secondary | ICD-10-CM | POA: Diagnosis present

## 2022-03-09 DIAGNOSIS — W228XXA Striking against or struck by other objects, initial encounter: Secondary | ICD-10-CM | POA: Diagnosis not present

## 2022-03-09 DIAGNOSIS — S61212A Laceration without foreign body of right middle finger without damage to nail, initial encounter: Secondary | ICD-10-CM | POA: Insufficient documentation

## 2022-03-09 DIAGNOSIS — Z79899 Other long term (current) drug therapy: Secondary | ICD-10-CM | POA: Insufficient documentation

## 2022-03-09 DIAGNOSIS — R5383 Other fatigue: Secondary | ICD-10-CM | POA: Diagnosis not present

## 2022-03-09 NOTE — Discharge Instructions (Addendum)
It was a pleasure taking care of you today!   The area was repaired with dermabond (glue).  Keep the area clean and dry and do not soak the wound in water.  If the wound gets wet you may pat it dry with a clean towel.  Do not rub at the wound.  Do not pick at the glue, the skin glue will naturally fall off within 5-10 days.  You may follow-up with your primary care provider as needed.  Return to the emergency department if worsening or persistent pain, drainage of wound, increased swelling, or color change to area.

## 2022-03-09 NOTE — ED Triage Notes (Signed)
Pt here with a small lac to her middle finger on the right hand , bleeding is controlled , tdap is up to date

## 2022-03-09 NOTE — ED Provider Notes (Signed)
[facility] Provider Note   CSN: [id number] Arrival date & time: 03/09/22  1044     History  Chief Complaint  Patient presents with   Finger Injury    Haley Wade is a 76 y.o. female who presents to the emergency department with concerns for right middle finger injury onset prior to arrival.  Notes that she was reaching her hand in a garbage disposal when it was turned off and when she lifted her hand out and accidentally cut on the blade.  Her tetanus is up-to-date.  She is on anticoagulants at this time.  No other meds tried prior to arrival.  Denies color change or numbness.  The history is provided by the patient. No language interpreter was used.       Home Medications Prior to Admission medications   Medication Sig Start Date End Date Taking? Authorizing Provider  acyclovir (ZOVIRAX) 200 MG capsule Take 200 mg by mouth daily.    [provider]  estrogens, conjugated, (PREMARIN) 0.625 MG tablet Take 0.625 mg by mouth daily.     [provider]  FERREX 150 150 MG capsule Take 150 mg by mouth daily with breakfast.    [provider]  finasteride (PROSCAR) 5 MG tablet Take 5 mg by mouth daily.    [provider]  fluconazole (DIFLUCAN) 150 MG tablet Take 150 mg by mouth daily. 02/15/22   [provider]  fluticasone (FLONASE) 50 MCG/ACT nasal spray Place 2 sprays into both nostrils daily as needed for allergies or rhinitis.    [provider]  ibuprofen (ADVIL) 800 MG tablet Take 200-800 mg by mouth every 8 (eight) hours as needed (for pain). 12/17/20   [provider]  Methen-Bella-Meth Bl-Phen Sal (URISED PO) Take 1 tablet by mouth daily as needed (for bladder spasms).    [provider]  omeprazole (PRILOSEC) 20 MG capsule Take 20 mg by mouth daily as needed (for reflux).    [provider]  spironolactone (ALDACTONE) 50 MG tablet Take 1 tablet (50 mg total) by mouth  daily. Patient taking differently: Take 50 mg by mouth 3 (three) times daily. 09/09/13   Shon Baton, MD  Vitamin D, Ergocalciferol, (DRISDOL) 1.25 MG (50000 UNIT) CAPS capsule Take 50,000 Units by mouth every Saturday.    [provider]  zaleplon (SONATA) 10 MG capsule TAKE ONE CAPSULE AT BEDTIME AS NEEDED FOR SLEEP Patient taking differently: Take 10 mg by mouth at bedtime as needed. 11/02/21   Cottle, Billey Co., MD      Allergies    Plaquenil [hydroxychloroquine sulfate], Bee venom, and Terconazole    Review of Systems   Review of Systems  Constitutional:  Negative for fever.  Musculoskeletal:  Positive for arthralgias. Negative for joint swelling.  Skin:  Positive for wound. Negative for color change.  All other systems reviewed and are negative.   Physical Exam Updated Vital Signs BP (!) 155/84 (BP Location: Left Arm)   Pulse 84   Temp 98.6 F (37 C)   Resp 16   Ht 4' 11.5" (1.511 m)   Wt 43.5 kg   SpO2 100%   BMI 19.07 kg/m  Physical Exam Vitals and nursing note reviewed.  Constitutional:      General: She is not in acute distress.    Appearance: Normal appearance. She is not ill-appearing.  HENT:     Head: Normocephalic and atraumatic.     Right Ear: External ear normal.  Left Ear: External ear normal.  Eyes:     General: No scleral icterus. Cardiovascular:     Rate and Rhythm: Normal rate.  Pulmonary:     Effort: Pulmonary effort is normal.  Musculoskeletal:        General: Normal range of motion.     Cervical back: Normal range of motion and neck supple.  Skin:    General: Skin is warm and dry.     Capillary Refill: Capillary refill takes less than 2 seconds.     Findings: Laceration present.     Comments: 0.5 cm laceration noted to superior to the DIP of the right middle finger.  Bleeding controlled at this time.  Radial pulse intact.  Neurovascular intact.  Neurological:     Mental Status: She is alert.     ED Results / Procedures /  Treatments   Labs (all labs ordered are listed, but only abnormal results are displayed) Labs Reviewed - No data to display  EKG None  Radiology No results found.  Procedures .Marland KitchenLaceration Repair  Date/Time: 03/09/2022 1:21 PM  Performed by: Nehemiah Settle, PA-C Authorized by: Nehemiah Settle, PA-C   Consent:    Consent obtained:  Verbal   Consent given by:  Patient   Risks discussed:  Infection, pain and need for additional repair Universal protocol:    Patient identity confirmed:  Verbally with patient and hospital-assigned identification number Anesthesia:    Anesthesia method:  None Laceration details:    Location:  Finger   Finger location:  R long finger   Length (cm):  0.5 Exploration:    Hemostasis achieved with:  Direct pressure   Imaging outcome: foreign body not noted     Wound exploration: entire depth of wound visualized   Treatment:    Area cleansed with:  Saline   Amount of cleaning:  Standard   Irrigation solution:  Sterile saline   Irrigation method:  Syringe Skin repair:    Repair method:  Tissue adhesive Approximation:    Approximation:  Close Repair type:    Repair type:  Simple Post-procedure details:    Dressing:  Sterile dressing   Procedure completion:  Tolerated well, no immediate complications     Medications Ordered in ED Medications - No data to display  ED Course/ Medical Decision Making/ A&P                           Medical Decision Making  Patient presents with laceration noted to right middle finger onset prior to arrival. Pt is not on anticoagulants at this time. Vital signs, patient afebrile. On exam, patient with 0.5 cm laceration noted to superior to the DIP of the right middle finger.  Bleeding controlled at this time.  Neurovascularly intact. Tetanus up-to-date, 2022. Laceration occurred < 12 hours prior to repair.  Differential diagnosis includes, fracture, foreign body, dislocation, avulsion.     Disposition: Presentation suspicious for laceration. Doubt fracture, dislocation, or foreign body at this time. Tetanus up to date. Wound thoroughly irrigated, no foreign bodies noted. Laceration repaired in the ED today. After consideration of the diagnostic results and the patients response to treatment, I feel that the patient would benefit from Discharge home. Discussed laceration care with pt and answered questions.  Patient to follow-up for wound check should there be signs of dehiscence or infection. Pt is hemodynamically stable with no complaints prior to discharge. Supportive care measures and strict return precautions discussed  with patient at bedside. Pt acknowledges and verbalizes understanding. Pt appears safe for discharge. Follow up as indicated in discharge paperwork.    This chart was dictated using voice recognition software, Dragon. Despite the best efforts of this provider to proofread and correct errors, errors may still occur which can change documentation meaning.  Final Clinical Impression(s) / ED Diagnoses Final diagnoses:  Laceration of right middle finger without foreign body without damage to nail, initial encounter    Rx / DC Orders ED Discharge Orders     None         Xayla Puzio A, PA-C 03/09/22 1413    Ezequiel Essex, MD 03/09/22 1637

## 2022-03-09 NOTE — ED Notes (Signed)
RN provided AVS using Teachback Method. Patient verbalizes understanding of Discharge Instructions. Opportunity for Questioning and Answers were provided by RN. Patient Discharged from ED ambulatory to Home via Self.  

## 2022-03-20 ENCOUNTER — Encounter: Payer: Self-pay | Admitting: Orthopedic Surgery

## 2022-03-20 ENCOUNTER — Ambulatory Visit (INDEPENDENT_AMBULATORY_CARE_PROVIDER_SITE_OTHER): Payer: Medicare Other

## 2022-03-20 ENCOUNTER — Ambulatory Visit (INDEPENDENT_AMBULATORY_CARE_PROVIDER_SITE_OTHER): Payer: Medicare Other | Admitting: Orthopedic Surgery

## 2022-03-20 ENCOUNTER — Ambulatory Visit: Payer: Medicare Other

## 2022-03-20 ENCOUNTER — Telehealth: Payer: Self-pay | Admitting: Orthopedic Surgery

## 2022-03-20 DIAGNOSIS — M25511 Pain in right shoulder: Secondary | ICD-10-CM | POA: Diagnosis not present

## 2022-03-20 DIAGNOSIS — M19011 Primary osteoarthritis, right shoulder: Secondary | ICD-10-CM | POA: Diagnosis not present

## 2022-03-20 MED ORDER — LIDOCAINE HCL 1 % IJ SOLN
3.0000 mL | INTRAMUSCULAR | Status: AC | PRN
  Administered 2022-03-20: 3 mL

## 2022-03-20 NOTE — Progress Notes (Signed)
Office Visit Note   Patient: Haley Wade           Date of Birth: [birth year]           MRN: [id number] Visit Date: 03/20/2022 Requested by: Shon Baton, MD [address] [place],  [place] [address] PCP: Shon Baton, MD  Subjective: Chief Complaint  Patient presents with   Right Shoulder - Pain    HPI: Haley Wade is a 76 year old patient with right shoulder pain.  She is doing well from her right shoulder rotator cuff repair but she developed swelling in the right shoulder about 3 weeks ago.  Noticed it after moving boxes.  Has had a prior AC joint injection.  The cyst has gotten much larger.  Patient is very thin and this has become more prominent.  It is tender to touch.  Denies any fevers or chills.  She does not have too much pain unless something touches the shoulder but this is difficult for her because of the size of this swelling as well as the types of close that she wears.              ROS: All systems reviewed are negative as they relate to the chief complaint within the history of present illness.  Patient denies  fevers or chills.   Assessment & Plan: Visit Diagnoses:  1. Right shoulder pain, unspecified chronicity     Plan: Impression is large AC joint cyst without evidence of infection.  She does have a history of gout.  The patient is taking a Medrol Dosepak currently.  The cyst is aspirated today after numbing up with about 5 cc of numbing medicine in order to decrease the viscosity of the gelatinous fluid within the cyst.  We were able to aspirate about 5 to 6 cc of gelatinous fluid from the cyst.  Cyst was decompressed by about two thirds.  Plan at this time is surgical excision with arthroscopy and distal clavicle excision.  The risk and benefits are discussed including but limited to infection as well as cyst recurrence.  Do not see need to go into the glenohumeral joint at this time.  Patient understands risk benefits and would like to do this is soon as  possible.  All questions answered ollow-Up Instructions: No follow-ups on file.   Orders:  Orders Placed This Encounter  Procedures   XR Shoulder Right   US Guided Needle Placement - No Linked Charges   No orders of the defined types were placed in this encounter.     Procedures: Medium Joint Inj: R acromioclavicular on 03/20/2022 8:24 PM Indications: diagnostic evaluation and pain Details: 25 G 1.5 in needle, ultrasound-guided superior approach Medications: 3 mL lidocaine 1 % Aspirate: 5 mL yellow Outcome: tolerated well, no immediate complications Procedure, treatment alternatives, risks and benefits explained, specific risks discussed. Consent was given by the patient. Immediately prior to procedure a time out was called to verify the correct patient, procedure, equipment, support staff and site/side marked as required. Patient was prepped and draped in the usual sterile fashion.       Clinical Data: No additional findings.  Objective: Vital Signs: There were no vitals taken for this visit.  Physical Exam:   Constitutional: Patient appears well-developed HEENT:  Head: Normocephalic Eyes:EOM are normal Neck: Normal range of motion Cardiovascular: Normal rate Pulmonary/chest: Effort normal Neurologic: Patient is alert Skin: Skin is warm Psychiatric: Patient has normal mood and affect   Ortho Exam: Ortho exam demonstrates excellent  shoulder range of motion.  A walnut sized mass is present over the [facility] joint.  Mildly tender to palpation.  Shoulder otherwise has excellent range of motion and strength with no coarse grinding or crepitus.  Aspiration of the cyst is performed with ultrasound localization.  Gelatinous fluid removed.  Cyst decompressed.  Specialty Comments:  No specialty comments available.  Imaging: XR Shoulder Right  Result Date: 03/20/2022 AP axillary and outlet radiographs right shoulder reviewed.  AC joint arthritis is present.  Shoulder is located.   No acute fracture.  Acromiohumeral distance intact.  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   Inflammatory arthritis 11/16/2021   Complete tear of right rotator cuff    Biceps tendonitis on right    S/P rotator cuff repair 02/08/2021   Chest discomfort 01/26/2014   Palpitations 01/26/2014   Cellulitis 09/08/2013   Abdominal pain, left mid abdomen, chronic 02/26/2013   Hyponatremia 10/30/2012   COPD, questioned 01/02/2012   Allergic rhinitis due to pollen 10/20/2010   MITRAL VALVE PROLAPSE, HX OF 05/26/2009   UNSPECIFIED HEARING LOSS 05/21/2009   RHINOSINUSITIS, 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   Past Medical History:  Diagnosis Date   Allergic rhinitis, cause unspecified    Anemia    Arthritis    Cellulitis    Chest discomfort    COPD, questioned    GAVE (gastric antral vascular ectasia)    Hyponatremia    MITRAL VALVE PROLAPSE, HX OF    Other chronic sinusitis    Palpitations    Syncope    Unspecified hearing loss     Family History  Problem Relation Age of Onset   Other Mother        c difficile gastroenteritis   Lung cancer Father        smoker   Liver disease Brother    Liver cancer Brother    Asthma Other        grandmother   Other Other        bronchitis-grandmother    Past Surgical History:  Procedure Laterality Date   APPENDECTOMY     BACK SURGERY     BICEPT TENODESIS Right 02/08/2021   Procedure: BICEPS TENODESIS;  Surgeon: Meredith Pel, MD;  Location: [facility];  Service: Orthopedics;  Laterality: Right;   BLADDER SURGERY     BOWEL RESECTION N/A 11/01/2012   Procedure: SMALL BOWEL RESECTION;  Surgeon: Earnstine Regal,  MD;  Location: [facility];  Service: General;  Laterality: N/A;   BREAST ENHANCEMENT SURGERY     revisions   BREAST SURGERY     CHOLECYSTECTOMY     COLON SURGERY     ESOPHAGOGASTRODUODENOSCOPY (EGD) WITH PROPOFOL N/A 10/14/2019   Procedure: ESOPHAGOGASTRODUODENOSCOPY (EGD) WITH PROPOFOL;  Surgeon: Clarene Essex, MD;  Location: [facility];  Service: Endoscopy;  Laterality: N/A;   EYE SURGERY     GI RADIOFREQUENCY ABLATION  10/14/2019   Procedure: GI RADIOFREQUENCY ABLATION;  Surgeon: Clarene Essex, MD;  Location: [facility];  Service: Endoscopy;;   LAPAROSCOPY N/A 11/01/2012   Procedure: LAPAROSCOPY DIAGNOSTIC;  Surgeon: Earnstine Regal, MD;  Location: [facility];  Service: General;  Laterality: N/A;   LAPAROTOMY N/A 11/01/2012  Procedure: EXPLORATORY LAPAROTOMY;  Surgeon: Earnstine Regal, MD;  Location: [facility];  Service: General;  Laterality: N/A;   LYSIS OF ADHESION N/A 11/01/2012   Procedure: LYSIS OF ADHESION;  Surgeon: Earnstine Regal, MD;  Location: [facility];  Service: General;  Laterality: N/A;   RHINOPLASTY     SHOULDER ARTHROSCOPY WITH OPEN ROTATOR CUFF REPAIR AND DISTAL CLAVICLE ACROMINECTOMY Right 02/08/2021   Procedure: SHOULDER ARTHROSCOPY WITH BICEPS TENDON RELEASE, MINI OPEN ROTATOR CUFF TEAR REPAIR OF THE INFRASPINATUS SUPRASPINATUS AND UPPER PORTIION OF THE SUBSCAP  WITH BICEPS TENODESIS;  Surgeon: Meredith Pel, MD;  Location: [facility];  Service: Orthopedics;  Laterality: Right;   TONSILLECTOMY     TOTAL ABDOMINAL HYSTERECTOMY     Social History   Occupational History   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

## 2022-03-20 NOTE — Telephone Encounter (Signed)
Worked in for this afternoon  

## 2022-03-20 NOTE — Telephone Encounter (Signed)
Patient called asked if she can be worked into Dr. Randel Pigg schedule? Patient said there is a large mass on the surgical site at the top of her right shoulder. Patient said it hurts to touch it. The number to contact patient is [phone]

## 2022-03-21 DIAGNOSIS — R319 Hematuria, unspecified: Secondary | ICD-10-CM | POA: Diagnosis not present

## 2022-03-21 DIAGNOSIS — N390 Urinary tract infection, site not specified: Secondary | ICD-10-CM | POA: Diagnosis not present

## 2022-03-21 DIAGNOSIS — R300 Dysuria: Secondary | ICD-10-CM | POA: Diagnosis not present

## 2022-03-21 DIAGNOSIS — N329 Bladder disorder, unspecified: Secondary | ICD-10-CM | POA: Diagnosis not present

## 2022-03-23 DIAGNOSIS — Z87440 Personal history of urinary (tract) infections: Secondary | ICD-10-CM | POA: Diagnosis not present

## 2022-03-23 DIAGNOSIS — R800 Isolated proteinuria: Secondary | ICD-10-CM | POA: Diagnosis not present

## 2022-03-23 DIAGNOSIS — R300 Dysuria: Secondary | ICD-10-CM | POA: Diagnosis not present

## 2022-03-23 DIAGNOSIS — N3010 Interstitial cystitis (chronic) without hematuria: Secondary | ICD-10-CM | POA: Diagnosis not present

## 2022-03-23 DIAGNOSIS — N390 Urinary tract infection, site not specified: Secondary | ICD-10-CM | POA: Diagnosis not present

## 2022-03-26 ENCOUNTER — Other Ambulatory Visit: Payer: Self-pay | Admitting: Surgical

## 2022-03-27 ENCOUNTER — Other Ambulatory Visit: Payer: Self-pay | Admitting: Surgical

## 2022-03-27 ENCOUNTER — Encounter: Payer: Self-pay | Admitting: Orthopedic Surgery

## 2022-03-27 DIAGNOSIS — G8918 Other acute postprocedural pain: Secondary | ICD-10-CM | POA: Diagnosis not present

## 2022-03-27 DIAGNOSIS — M19011 Primary osteoarthritis, right shoulder: Secondary | ICD-10-CM | POA: Diagnosis not present

## 2022-03-27 DIAGNOSIS — M25811 Other specified joint disorders, right shoulder: Secondary | ICD-10-CM | POA: Diagnosis not present

## 2022-03-27 MED ORDER — METHOCARBAMOL 500 MG PO TABS: 500.0000 mg | ORAL_TABLET | Freq: Three times a day (TID) | ORAL | 1 refills | Status: DC | PRN

## 2022-03-27 MED ORDER — IBUPROFEN 800 MG PO TABS: 800.0000 mg | ORAL_TABLET | Freq: Three times a day (TID) | ORAL | 0 refills | Status: DC | PRN

## 2022-03-27 MED ORDER — HYDROCODONE-ACETAMINOPHEN 5-325 MG PO TABS: 1.0000 | ORAL_TABLET | ORAL | 0 refills | Status: DC | PRN

## 2022-04-03 ENCOUNTER — Ambulatory Visit (INDEPENDENT_AMBULATORY_CARE_PROVIDER_SITE_OTHER): Payer: Medicare Other | Admitting: Orthopedic Surgery

## 2022-04-03 ENCOUNTER — Encounter: Payer: Self-pay | Admitting: Orthopedic Surgery

## 2022-04-03 DIAGNOSIS — M19011 Primary osteoarthritis, right shoulder: Secondary | ICD-10-CM

## 2022-04-03 NOTE — Progress Notes (Signed)
Post-Op Visit Note   Patient: Haley Wade           Date of Birth: 1945-11-09           MRN: [id number] Visit Date: 04/03/2022 PCP: Shon Baton, MD   Assessment & Plan:  Chief Complaint:  Chief Complaint  Patient presents with   Right Shoulder - Routine Post Op   Visit Diagnoses:  1. Arthritis of right acromioclavicular joint     Plan: [name] is a 76 year old patient who is now a week out right shoulder arthroscopy with distal clavicle excision and cyst decompression.  Overall she is feeling better.  On exam portal sutures are intact and the sutures are removed.  Incisions intact.  Passive range of motion is 20/75/80.  Plan at this time is to discontinue the shoulder sling.  No lifting with the right arm.  Okay for range of motion exercises.  3-week return with clinical recheck and initiation of physical therapy at that time.  For now I just want the distal clavicle site to scar and then calm down a little bit before initiating formal physical therapy.  Pendulum exercises okay along with the passive range of motion exercises she did with her initial rotator cuff tear repair  Follow-Up Instructions: No follow-ups on file.   Orders:  No orders of the defined types were placed in this encounter.  No orders of the defined types were placed in this encounter.   Imaging: No results found.  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   Inflammatory arthritis 11/16/2021   Complete tear of right rotator cuff    Biceps tendonitis on right    S/P rotator cuff repair 02/08/2021   Chest discomfort 01/26/2014   Palpitations 01/26/2014   Cellulitis 09/08/2013   Abdominal pain, left mid abdomen, chronic 02/26/2013   Hyponatremia 10/30/2012   COPD, questioned  01/02/2012   Allergic rhinitis due to pollen 10/20/2010   MITRAL VALVE PROLAPSE, HX OF 05/26/2009   UNSPECIFIED HEARING LOSS 05/21/2009   RHINOSINUSITIS, 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   Past Medical History:  Diagnosis Date   Allergic rhinitis, cause unspecified    Anemia    Arthritis    Cellulitis    Chest discomfort    COPD, questioned    GAVE (gastric antral vascular ectasia)    Hyponatremia    MITRAL VALVE PROLAPSE, HX OF    Other chronic sinusitis    Palpitations    Syncope    Unspecified hearing loss     Family History  Problem Relation Age of Onset   Other Mother        c difficile gastroenteritis   Lung cancer Father        smoker   Liver disease Brother    Liver cancer Brother    Asthma Other        grandmother   Other Other        bronchitis-grandmother    Past Surgical History:  Procedure Laterality Date   APPENDECTOMY     BACK SURGERY     BICEPT TENODESIS Right 02/08/2021   Procedure: BICEPS TENODESIS;  Surgeon: Meredith Pel, MD;  Location: [facility];  Service: Orthopedics;  Laterality: Right;   BLADDER SURGERY     BOWEL RESECTION N/A 11/01/2012   Procedure: SMALL BOWEL RESECTION;  Surgeon: Earnstine Regal, MD;  Location: [facility];  Service: General;  Laterality: N/A;   BREAST ENHANCEMENT SURGERY     revisions   BREAST SURGERY     CHOLECYSTECTOMY     COLON SURGERY     ESOPHAGOGASTRODUODENOSCOPY (EGD) WITH PROPOFOL N/A 10/14/2019   Procedure: ESOPHAGOGASTRODUODENOSCOPY (EGD) WITH PROPOFOL;  Surgeon: Clarene Essex, MD;  Location: [facility];  Service: Endoscopy;  Laterality: N/A;   EYE SURGERY     GI RADIOFREQUENCY ABLATION  10/14/2019   Procedure: GI RADIOFREQUENCY ABLATION;  Surgeon: Clarene Essex, MD;  Location: [facility];  Service: Endoscopy;;   LAPAROSCOPY N/A 11/01/2012   Procedure: LAPAROSCOPY DIAGNOSTIC;  Surgeon: Earnstine Regal, MD;  Location: [facility];  Service: General;  Laterality: N/A;   LAPAROTOMY N/A 11/01/2012   Procedure: EXPLORATORY LAPAROTOMY;   Surgeon: Earnstine Regal, MD;  Location: [facility];  Service: General;  Laterality: N/A;   LYSIS OF ADHESION N/A 11/01/2012   Procedure: LYSIS OF ADHESION;  Surgeon: Earnstine Regal, MD;  Location: [facility];  Service: General;  Laterality: N/A;   RHINOPLASTY     SHOULDER ARTHROSCOPY WITH OPEN ROTATOR CUFF REPAIR AND DISTAL CLAVICLE ACROMINECTOMY Right 02/08/2021   Procedure: SHOULDER ARTHROSCOPY WITH BICEPS TENDON RELEASE, MINI OPEN ROTATOR CUFF TEAR REPAIR OF THE INFRASPINATUS SUPRASPINATUS AND UPPER PORTIION OF THE SUBSCAP  WITH BICEPS TENODESIS;  Surgeon: Meredith Pel, MD;  Location: [facility];  Service: Orthopedics;  Laterality: Right;   TONSILLECTOMY     TOTAL ABDOMINAL HYSTERECTOMY     Social History   Occupational History   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

## 2022-04-11 ENCOUNTER — Emergency Department (HOSPITAL_BASED_OUTPATIENT_CLINIC_OR_DEPARTMENT_OTHER)
Admission: EM | Admit: 2022-04-11 | Discharge: 2022-04-11 | Disposition: A | Discharge status: Home / Self Care | Payer: Medicare Other | Attending: Emergency Medicine | Admitting: Emergency Medicine

## 2022-04-11 ENCOUNTER — Other Ambulatory Visit (HOSPITAL_BASED_OUTPATIENT_CLINIC_OR_DEPARTMENT_OTHER): Payer: Self-pay

## 2022-04-11 ENCOUNTER — Other Ambulatory Visit: Payer: Self-pay

## 2022-04-11 ENCOUNTER — Encounter (HOSPITAL_BASED_OUTPATIENT_CLINIC_OR_DEPARTMENT_OTHER): Payer: Self-pay

## 2022-04-11 DIAGNOSIS — M545 Low back pain, unspecified: Secondary | ICD-10-CM | POA: Diagnosis present

## 2022-04-11 DIAGNOSIS — R531 Weakness: Secondary | ICD-10-CM | POA: Insufficient documentation

## 2022-04-11 DIAGNOSIS — G8929 Other chronic pain: Secondary | ICD-10-CM | POA: Diagnosis not present

## 2022-04-11 DIAGNOSIS — M5441 Lumbago with sciatica, right side: Secondary | ICD-10-CM | POA: Diagnosis not present

## 2022-04-11 MED ORDER — LIDOCAINE 5 % EX PTCH
1.0000 | MEDICATED_PATCH | CUTANEOUS | 0 refills | Status: DC
  Filled 2022-04-11: qty 30, 30d supply, fill #0

## 2022-04-11 MED ORDER — HYDROCODONE-ACETAMINOPHEN 5-325 MG PO TABS
1.0000 | ORAL_TABLET | Freq: Once | ORAL | Status: AC
  Administered 2022-04-11: 1 via ORAL
  Filled 2022-04-11: qty 1

## 2022-04-11 NOTE — ED Triage Notes (Signed)
Pt states that she has been having pain from the lower back that shoots down R leg since yesterday. Denies injury

## 2022-04-11 NOTE — ED Provider Notes (Signed)
[facility] EMERGENCY DEPT Provider Note   CSN: [id number] Arrival date & time: 04/11/22  0551     History  Chief Complaint  Patient presents with   Sciatica    Haley Wade is a 76 y.o. female.  HPI   76 year old female with medical history significant for anemia, arthritis, chronic low back pain who presents to the emergency department with right-sided low back pain that shoots down her right leg.  The patient denies any falls or recent trauma.  She denies any fevers or chills.  She denies any saddle anesthesia.  She denies any urinary or fecal incontinence.  She had a few episodes of diarrhea over the past couple of days and had to rush to go to the bathroom but denies any true incontinence.  She states that she has had a 20-year history of low back pain with associated sciatica.  She denies any new weakness.  She feels weak in the setting of pain associated with her sciatica.  The patient states that the pain radiates from her back down to her groin.  She denies any dysuria or increased urinary frequency.  She endorses some radiation of pain occasionally down to her toes as well.  Home Medications Prior to Admission medications   Medication Sig Start Date End Date Taking? Authorizing Provider  lidocaine (LIDODERM) 5 % Place 1 patch onto the skin daily. Remove & Discard patch within 12 hours or as directed by MD 04/11/22  Yes Regan Lemming, MD  acyclovir (ZOVIRAX) 200 MG capsule Take 200 mg by mouth daily.    [provider]  estrogens, conjugated, (PREMARIN) 0.625 MG tablet Take 0.625 mg by mouth daily.     [provider]  FERREX 150 150 MG capsule Take 150 mg by mouth daily with breakfast.    [provider]  finasteride (PROSCAR) 5 MG tablet Take 5 mg by mouth daily.    [provider]  fluconazole (DIFLUCAN) 150 MG tablet Take 150 mg by mouth daily. 02/15/22   [provider]  fluticasone (FLONASE) 50 MCG/ACT  nasal spray Place 2 sprays into both nostrils daily as needed for allergies or rhinitis.    [provider]  HYDROcodone-acetaminophen (NORCO/VICODIN) 5-325 MG tablet Take 1 tablet by mouth every 4 (four) hours as needed for moderate pain. 03/27/22 03/27/23  Magnant, Charles L, PA-C  ibuprofen (ADVIL) 800 MG tablet Take 1 tablet (800 mg total) by mouth every 8 (eight) hours as needed (for pain). 03/27/22   Magnant, Charles L, PA-C  Methen-Bella-Meth Bl-Phen Sal (URISED PO) Take 1 tablet by mouth daily as needed (for bladder spasms).    [provider]  methocarbamol (ROBAXIN) 500 MG tablet Take 1 tablet (500 mg total) by mouth every 8 (eight) hours as needed for muscle spasms. 03/27/22   Magnant, Gerrianne Scale, PA-C  omeprazole (PRILOSEC) 20 MG capsule Take 20 mg by mouth daily as needed (for reflux).    [provider]  spironolactone (ALDACTONE) 50 MG tablet Take 1 tablet (50 mg total) by mouth daily. Patient taking differently: Take 50 mg by mouth 3 (three) times daily. 09/09/13   Shon Baton, MD  Vitamin D, Ergocalciferol, (DRISDOL) 1.25 MG (50000 UNIT) CAPS capsule Take 50,000 Units by mouth every Saturday.    [provider]  zaleplon (SONATA) 10 MG capsule TAKE ONE CAPSULE AT BEDTIME AS NEEDED FOR SLEEP Patient taking differently: Take 10 mg by mouth at bedtime as needed. 11/02/21   Reatha Armour  Brooke Bonito., MD      Allergies    Plaquenil [hydroxychloroquine sulfate], Bee venom, and Terconazole    Review of Systems   Review of Systems  Musculoskeletal:  Positive for back pain.  All other systems reviewed and are negative.   Physical Exam Updated Vital Signs BP 138/82 (BP Location: Right Arm)   Pulse 80   Temp 97.8 F (36.6 C)   Resp 17   Ht 4' 11.5" (1.511 m)   Wt 44.5 kg   SpO2 98%   BMI 19.46 kg/m  Physical Exam Vitals and nursing note reviewed.  Constitutional:      General: She is not in acute distress.    Appearance: She is well-developed.   HENT:     Head: Normocephalic and atraumatic.  Eyes:     Conjunctiva/sclera: Conjunctivae normal.  Cardiovascular:     Rate and Rhythm: Normal rate and regular rhythm.  Pulmonary:     Effort: Pulmonary effort is normal. No respiratory distress.     Breath sounds: Normal breath sounds.  Abdominal:     Palpations: Abdomen is soft.     Tenderness: There is no abdominal tenderness.     Comments: No inguinal or femoral hernia palpated  Musculoskeletal:        General: No swelling.     Cervical back: Neck supple.     Comments: No midline tenderness to palpation of the cervical, thoracic or lumbar spine.  Positive straight leg raise test on the right.  Skin:    General: Skin is warm and dry.     Capillary Refill: Capillary refill takes less than 2 seconds.  Neurological:     General: No focal deficit present.     Mental Status: She is alert and oriented to person, place, and time. Mental status is at baseline.     Comments: 5 out of 5 strength in the bilateral upper extremities, 5 out of 5 strength in the bilateral lower extremities, no sensory deficit  Psychiatric:        Mood and Affect: Mood normal.     ED Results / Procedures / Treatments   Labs (all labs ordered are listed, but only abnormal results are displayed) Labs Reviewed - No data to display  EKG None  Radiology No results found.  Procedures Procedures    Medications Ordered in ED Medications  HYDROcodone-acetaminophen (NORCO/VICODIN) 5-325 MG per tablet 1 tablet (1 tablet Oral Given 04/11/22 8016)    ED Course/ Medical Decision Making/ A&P                           Medical Decision Making Amount and/or Complexity of Data Reviewed Radiology: ordered.  Risk Prescription drug management.    76 year old female with medical history significant for anemia, arthritis, chronic low back pain who presents to the emergency department with right-sided low back pain that shoots down her right leg.  The patient  denies any falls or recent trauma.  She denies any fevers or chills.  She denies any saddle anesthesia.  She denies any urinary or fecal incontinence.  She had a few episodes of diarrhea over the past couple of days and had to rush to go to the bathroom but denies any true incontinence.  She states that she has had a 20-year history of low back pain with associated sciatica.  She denies any new weakness.  She feels weak in the setting of pain associated with her sciatica.  The  patient states that the pain radiates from her back down to her groin.  She denies any dysuria or increased urinary frequency.  She endorses some radiation of pain occasionally down to her toes as well.  Haley Wade presents with back pain as per above. I have reviewed the nursing documentation for past medical history, family history, and social history. I have reviewed the EMR and have learned that she is a long-term history of chronic low back pain and is followed outpatient by pain management and is chronically on opiates.  She is able to ambulate.  The patient is able to ambulate and is hemodynamically stable. There are no red flag symptoms. Specifically, she denies: -Being on an anticoagulant or blood thinner -Using IV drugs -Having a history of AAA -Having saddle anesthesia -Having urinary or fecal incontinence -Having any recent falls or trauma  Differential Diagnoses: I do not think that Haley Wade is experiencing cauda equina syndrome, abdominal aortic aneurysm, epidural abscess, aortic dissection, spinal hematoma, nephrolithiasis, spinal metastasis, discitis, or an acute fracture.   While in the ED, I provided the patient with: Medications  HYDROcodone-acetaminophen (NORCO/VICODIN) 5-325 MG per tablet 1 tablet (1 tablet Oral Given 04/11/22 0824)    On reassessment, the patient was well-appearing, stable, neurologically intact. This presentation is most consistent with acute on chronic low  back pain with sciatica down the right side.  Do not see indication for emergent MRI imaging.  Patient could benefit from MRI imaging outpatient and this was ordered.  Advised that she follow-up outpatient with neurosurgery and pain management for continued chronic management of her symptoms.  Given the patient's reassuring presentation, I believe that she is safe for discharge.  I provided ED return precautions, specifically for the symptoms which are most concerning (e.g., saddle anesthesia, urinary or bowel incontinence or retention, changing or worsening pain), which would necessitate immediate return.  I encouraged the patient to followup with their PCP, pain management and neurosurgery.  Final Clinical Impression(s) / ED Diagnoses Final diagnoses:  Chronic right-sided low back pain with right-sided sciatica    Rx / DC Orders ED Discharge Orders          Ordered    Ambulatory referral to Pain Clinic        04/11/22 1057    Ambulatory referral to Neurosurgery        04/11/22 1057    lidocaine (LIDODERM) 5 %  Every 24 hours        04/11/22 1058    MR LUMBAR SPINE WO CONTRAST        04/11/22 1059              Regan Lemming, MD 04/11/22 1107

## 2022-04-11 NOTE — Discharge Instructions (Addendum)
Recommend you follow-up with neurosurgery in clinic in addition to pain management outpatient for management of your acute on chronic low back pain with sciatica. Continue taking your home pain medications and muscle relaxants as needed.

## 2022-04-11 NOTE — ED Notes (Signed)
Discharge instructions, follow up care, and prescription reviewed and explained, pt verbalized understanding. Pt caox4 and had no further questions on d/c.

## 2022-04-13 ENCOUNTER — Telehealth: Payer: Self-pay | Admitting: Physical Medicine and Rehabilitation

## 2022-04-13 NOTE — Telephone Encounter (Signed)
Patient called needing to schedule an appointment with Dr. Ernestina Patches. The number to contact patient is [phone]

## 2022-04-21 ENCOUNTER — Ambulatory Visit (HOSPITAL_COMMUNITY)
Admission: RE | Admit: 2022-04-21 | Discharge: 2022-04-21 | Disposition: A | Discharge status: Home / Self Care | Payer: Medicare Other | Source: Ambulatory Visit | Attending: Emergency Medicine | Admitting: Emergency Medicine

## 2022-04-21 DIAGNOSIS — M545 Low back pain, unspecified: Secondary | ICD-10-CM | POA: Diagnosis not present

## 2022-04-21 DIAGNOSIS — M48061 Spinal stenosis, lumbar region without neurogenic claudication: Secondary | ICD-10-CM | POA: Diagnosis not present

## 2022-04-21 DIAGNOSIS — M47816 Spondylosis without myelopathy or radiculopathy, lumbar region: Secondary | ICD-10-CM | POA: Diagnosis not present

## 2022-04-21 DIAGNOSIS — Z974 Presence of external hearing-aid: Secondary | ICD-10-CM | POA: Diagnosis not present

## 2022-04-21 DIAGNOSIS — M5126 Other intervertebral disc displacement, lumbar region: Secondary | ICD-10-CM | POA: Diagnosis not present

## 2022-04-21 DIAGNOSIS — Z01118 Encounter for examination of ears and hearing with other abnormal findings: Secondary | ICD-10-CM | POA: Diagnosis not present

## 2022-04-21 DIAGNOSIS — H903 Sensorineural hearing loss, bilateral: Secondary | ICD-10-CM | POA: Diagnosis not present

## 2022-04-24 ENCOUNTER — Ambulatory Visit (INDEPENDENT_AMBULATORY_CARE_PROVIDER_SITE_OTHER): Payer: Medicare Other | Admitting: Orthopedic Surgery

## 2022-04-24 ENCOUNTER — Encounter: Payer: Self-pay | Admitting: Orthopedic Surgery

## 2022-04-24 DIAGNOSIS — M19011 Primary osteoarthritis, right shoulder: Secondary | ICD-10-CM

## 2022-04-24 DIAGNOSIS — M5441 Lumbago with sciatica, right side: Secondary | ICD-10-CM

## 2022-04-24 DIAGNOSIS — M5442 Lumbago with sciatica, left side: Secondary | ICD-10-CM

## 2022-04-24 NOTE — Progress Notes (Signed)
Post-Op Visit Note   Patient: Haley Wade           Date of Birth: 1945/12/04           MRN: [id number] Visit Date: 04/24/2022 PCP: Shon Baton, MD   Assessment & Plan:  Chief Complaint:  Chief Complaint  Patient presents with   Right Shoulder - Routine Post Op   Lower Back - Pain   Visit Diagnoses:  1. Arthritis of right acromioclavicular joint     Plan: Haley Wade is a 76 year old patient who underwent right shoulder arthroscopy with distal clavicle excision cyst decompression 10/25/2021.  Doing well.  Range of motion improving.  She is driving.  She also like to review the MRI scan of her lumbar spine.  Has had prior lumbar spine surgery at L4-5.  Reviewed the scan with her.  Does show degenerative disc disease at L4-5 but no large acute herniated disc present.  On examination she is got improving right shoulder range of motion particularly with overhead motion.  No nerve root tension signs and no groin pain on the right or left-hand side with internal/external rotation of the leg.  Gait is normal.  Her pain does tend to radiate down behind both calves into the foot on the right-hand side.  Having lumbar back pain in the SI joint region on the right left-hand side.  Plan at this time is to refer her to Dr. Ernestina Patches for L-spine ESI.  Contour of the shoulder looks good still slightly tender to palpation around the right-hand side.  Follow-up with [place] as needed but needs to see Dr. Ernestina Patches for the next 1 to 2 weeks.  Follow-Up Instructions: Return if symptoms worsen or fail to improve.   Orders:  No orders of the defined types were placed in this encounter.  No orders of the defined types were placed in this encounter.   Imaging: No results found.  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   Inflammatory arthritis 11/16/2021   Complete tear of right rotator cuff    Biceps tendonitis on right    S/P rotator cuff repair 02/08/2021   Chest discomfort 01/26/2014   Palpitations 01/26/2014   Cellulitis 09/08/2013   Abdominal pain, left mid abdomen, chronic 02/26/2013   Hyponatremia 10/30/2012   COPD, questioned 01/02/2012   Allergic rhinitis due to pollen 10/20/2010   MITRAL VALVE PROLAPSE, HX OF 05/26/2009   UNSPECIFIED HEARING LOSS 05/21/2009   RHINOSINUSITIS, 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   Past Medical History:  Diagnosis Date   Allergic rhinitis, cause unspecified    Anemia    Arthritis    Cellulitis    Chest discomfort    COPD, questioned    GAVE (gastric antral vascular ectasia)    Hyponatremia    MITRAL VALVE PROLAPSE, HX OF    Other chronic sinusitis    Palpitations    Syncope    Unspecified hearing loss     Family History  Problem Relation Age of Onset   Other Mother        c difficile gastroenteritis   Lung cancer Father        smoker   Liver disease Brother    Liver cancer Brother    Asthma Other        grandmother   Other  Other        bronchitis-grandmother    Past Surgical History:  Procedure Laterality Date   APPENDECTOMY     BACK SURGERY     BICEPT TENODESIS Right 02/08/2021   Procedure: BICEPS TENODESIS;  Surgeon: Meredith Pel, MD;  Location: [facility];  Service: Orthopedics;  Laterality: Right;   BLADDER SURGERY     BOWEL RESECTION N/A 11/01/2012   Procedure: SMALL BOWEL RESECTION;  Surgeon: Earnstine Regal, MD;  Location: [facility];  Service: General;  Laterality: N/A;   BREAST ENHANCEMENT SURGERY     revisions   BREAST SURGERY     CHOLECYSTECTOMY     COLON SURGERY     ESOPHAGOGASTRODUODENOSCOPY (EGD) WITH PROPOFOL N/A 10/14/2019   Procedure: ESOPHAGOGASTRODUODENOSCOPY (EGD) WITH PROPOFOL;  Surgeon: Clarene Essex, MD;  Location: [facility];  Service: Endoscopy;  Laterality: N/A;   EYE SURGERY     GI  RADIOFREQUENCY ABLATION  10/14/2019   Procedure: GI RADIOFREQUENCY ABLATION;  Surgeon: Clarene Essex, MD;  Location: [facility];  Service: Endoscopy;;   LAPAROSCOPY N/A 11/01/2012   Procedure: LAPAROSCOPY DIAGNOSTIC;  Surgeon: Earnstine Regal, MD;  Location: [facility];  Service: General;  Laterality: N/A;   LAPAROTOMY N/A 11/01/2012   Procedure: EXPLORATORY LAPAROTOMY;  Surgeon: Earnstine Regal, MD;  Location: [facility];  Service: General;  Laterality: N/A;   LYSIS OF ADHESION N/A 11/01/2012   Procedure: LYSIS OF ADHESION;  Surgeon: Earnstine Regal, MD;  Location: [facility];  Service: General;  Laterality: N/A;   RHINOPLASTY     SHOULDER ARTHROSCOPY WITH OPEN ROTATOR CUFF REPAIR AND DISTAL CLAVICLE ACROMINECTOMY Right 02/08/2021   Procedure: SHOULDER ARTHROSCOPY WITH BICEPS TENDON RELEASE, MINI OPEN ROTATOR CUFF TEAR REPAIR OF THE INFRASPINATUS SUPRASPINATUS AND UPPER PORTIION OF THE SUBSCAP  WITH BICEPS TENODESIS;  Surgeon: Meredith Pel, MD;  Location: [facility];  Service: Orthopedics;  Laterality: Right;   TONSILLECTOMY     TOTAL ABDOMINAL HYSTERECTOMY     Social History   Occupational History   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

## 2022-04-24 NOTE — Addendum Note (Signed)
Addended byLaurann Montana on: 04/24/2022 02:38 PM   Modules accepted: Orders

## 2022-04-27 DIAGNOSIS — H6061 Unspecified chronic otitis externa, right ear: Secondary | ICD-10-CM | POA: Diagnosis not present

## 2022-04-27 DIAGNOSIS — H903 Sensorineural hearing loss, bilateral: Secondary | ICD-10-CM | POA: Diagnosis not present

## 2022-04-27 DIAGNOSIS — H608X1 Other otitis externa, right ear: Secondary | ICD-10-CM | POA: Diagnosis not present

## 2022-04-27 DIAGNOSIS — L905 Scar conditions and fibrosis of skin: Secondary | ICD-10-CM | POA: Diagnosis not present

## 2022-04-27 DIAGNOSIS — L309 Dermatitis, unspecified: Secondary | ICD-10-CM | POA: Diagnosis not present

## 2022-04-27 DIAGNOSIS — H90A21 Sensorineural hearing loss, unilateral, right ear, with restricted hearing on the contralateral side: Secondary | ICD-10-CM | POA: Diagnosis not present

## 2022-04-27 DIAGNOSIS — H9041 Sensorineural hearing loss, unilateral, right ear, with unrestricted hearing on the contralateral side: Secondary | ICD-10-CM | POA: Diagnosis not present

## 2022-05-02 ENCOUNTER — Telehealth: Payer: Self-pay | Admitting: Physical Medicine and Rehabilitation

## 2022-05-02 NOTE — Telephone Encounter (Signed)
Patient called having some questions about her upcoming appointment with Dr. Ernestina Patches and would like a call back. CB # [phone]

## 2022-05-02 NOTE — Telephone Encounter (Signed)
I called, answered patient's questions.

## 2022-05-03 DIAGNOSIS — R102 Pelvic and perineal pain: Secondary | ICD-10-CM | POA: Diagnosis not present

## 2022-05-03 DIAGNOSIS — N898 Other specified noninflammatory disorders of vagina: Secondary | ICD-10-CM | POA: Diagnosis not present

## 2022-05-03 DIAGNOSIS — N760 Acute vaginitis: Secondary | ICD-10-CM | POA: Diagnosis not present

## 2022-05-05 DIAGNOSIS — A09 Infectious gastroenteritis and colitis, unspecified: Secondary | ICD-10-CM | POA: Diagnosis not present

## 2022-05-15 ENCOUNTER — Ambulatory Visit (INDEPENDENT_AMBULATORY_CARE_PROVIDER_SITE_OTHER): Payer: Medicare Other | Admitting: Physical Medicine and Rehabilitation

## 2022-05-15 ENCOUNTER — Encounter: Payer: Self-pay | Admitting: Physical Medicine and Rehabilitation

## 2022-05-15 ENCOUNTER — Ambulatory Visit: Payer: Medicare Other

## 2022-05-15 VITALS — BP 118/75 | HR 83 | Ht 59.5 in | Wt 98.0 lb

## 2022-05-15 DIAGNOSIS — M47816 Spondylosis without myelopathy or radiculopathy, lumbar region: Secondary | ICD-10-CM

## 2022-05-15 DIAGNOSIS — M48061 Spinal stenosis, lumbar region without neurogenic claudication: Secondary | ICD-10-CM

## 2022-05-15 DIAGNOSIS — M25552 Pain in left hip: Secondary | ICD-10-CM | POA: Diagnosis not present

## 2022-05-15 DIAGNOSIS — M25551 Pain in right hip: Secondary | ICD-10-CM

## 2022-05-15 DIAGNOSIS — M5416 Radiculopathy, lumbar region: Secondary | ICD-10-CM

## 2022-05-15 DIAGNOSIS — M5116 Intervertebral disc disorders with radiculopathy, lumbar region: Secondary | ICD-10-CM | POA: Diagnosis not present

## 2022-05-15 MED ORDER — METHYLPREDNISOLONE ACETATE 80 MG/ML IJ SUSP
40.0000 mg | Freq: Once | INTRAMUSCULAR | Status: AC
  Administered 2022-05-15: 40 mg

## 2022-05-15 NOTE — Progress Notes (Unsigned)
Patient has chronic back pain. When the pain that she initially was having started, it was across the low back and severe. The pain has gotten a little better, but she still has discomfort. More 5/10 than 10/10.  She has pain that radiates down the right leg and occasional cramping bilateral legs in her calves. She takes '400mg'$  ibuprofen as needed with relief.   Numeric Pain Rating Scale and Functional Assessment Average Pain 5   In the last MONTH (on 0-10 scale) has pain interfered with the following?  1. General activity like being  able to carry out your everyday physical activities such as walking, climbing stairs, carrying groceries, or moving a chair?  Rating(10)   +Driver, -BT, -Dye Allergies.

## 2022-05-15 NOTE — Patient Instructions (Signed)

## 2022-05-18 NOTE — Procedures (Signed)
Lumbar Epidural Steroid Injection - Interlaminar Approach with Fluoroscopic Guidance  Patient: Haley Wade      Date of Birth: 1945/11/21 MRN: [id number] PCP: Shon Baton, MD      Visit Date: 05/15/2022   Universal Protocol:     Consent Given By: the patient  Position: PRONE  Additional Comments: Vital signs were monitored before and after the procedure. Patient was prepped and draped in the usual sterile fashion. The correct patient, procedure, and site was verified.   Injection Procedure Details:   Procedure diagnoses: Lumbar radiculopathy [M54.16]   Meds Administered:  Meds ordered this encounter  Medications   methylPREDNISolone acetate (DEPO-MEDROL) injection 40 mg     Laterality: Right  Location/Site:  L3-4  Needle: 3.5 in., 20 ga. Tuohy  Needle Placement: Paramedian epidural  Findings:   -Comments: Excellent flow of contrast into the epidural space.  Procedure Details: Using a paramedian approach from the side mentioned above, the region overlying the inferior lamina was localized under fluoroscopic visualization and the soft tissues overlying this structure were infiltrated with 4 ml. of 1% Lidocaine without Epinephrine. The Tuohy needle was inserted into the epidural space using a paramedian approach.   The epidural space was localized using loss of resistance along with counter oblique bi-planar fluoroscopic views.  After negative aspirate for air, blood, and CSF, a 2 ml. volume of Isovue-250 was injected into the epidural space and the flow of contrast was observed. Radiographs were obtained for documentation purposes.    The injectate was administered into the level noted above.   Additional Comments:  The patient tolerated the procedure well Dressing: 2 x 2 sterile gauze and Band-Aid    Post-procedure details: Patient was observed during the procedure. Post-procedure instructions were reviewed.  Patient left the clinic in stable  condition.

## 2022-05-18 NOTE — Progress Notes (Signed)
Haley Wade - 76 y.o. female MRN [id number]  Date of birth: September 21, 1945  Office Visit Note: Visit Date: 05/15/2022 PCP: Shon Baton, MD Referred by: Meredith Pel, MD  Subjective: Chief Complaint  Patient presents with   Lower Back - Pain   HPI: Haley Wade is a 76 y.o. female who comes in today at the request of G. Alphonzo Severance, MD for evaluation management chronic worsening low back and bilateral hip pain with pain into the right hip and leg and some groin pain.  She has a history of chronic back pain for 20 years.  She has had that managed in the past with some pain management with some opioid treatment as well as adjunct of treatment and injection treatment.  She has had a remote L4-5 laminectomy.  She has been followed by Dr. Marlou Sa for her shoulder and has had shoulder surgery.  August 29 she ended up going to the emergency department with acute onset of worsening severe right hip and leg pain consistent with a combination of radicular pain down the leg somewhat of an L5 distribution but then also somewhat of a anterior distribution with pain into the groin.  She gets left-sided low back pain as well.  She has a history of conservative treatment with physical therapy but none recently for her back although she did go through that with her shoulder.  She rates her average pain is 5 out of 10 but a 10 out of 10 in terms of functional impairment with the pain itself.  She reports that on the 29th she had initial pain across the back which was very severe and into the leg and now it is more into the buttock area and thigh more right than left.  She reports it has gotten a little bit better and is no longer a 10 out of 10.  She was given some hydrocodone in the emergency department is not taking that now.  She gets some cramping into both legs and calves.  She is taking ibuprofen.  She has had muscle laxer's in the past.  An MRI of the lumbar spine was obtained by Dr. Marlou Sa  and this is reviewed below in detail.  This does show prior laminectomy at L4-5 with L3-4 broad disc bulging which is not a painful condition but mixed with arthritic changes and lateral recess narrowing could cause symptoms on either side.  Dr. Marlou Sa did not feel like her hip was an issue in terms of physical exam findings.  No real imaging of the hips.  She has had some feeling of weakness in the legs but no focal weakness or foot drop.     Review of Systems  Musculoskeletal:  Positive for back pain and joint pain.  Neurological:  Positive for tingling and weakness.  All other systems reviewed and are negative.  Otherwise per HPI.  Assessment & Plan: Visit Diagnoses:    ICD-10-CM   1. Lumbar radiculopathy  M54.16 XR C-ARM NO REPORT    Epidural Steroid injection    methylPREDNISolone acetate (DEPO-MEDROL) injection 40 mg    2. Stenosis of lateral recess of lumbar spine  M48.061     3. Spondylosis without myelopathy or radiculopathy, lumbar region  M47.816     4. Radiculopathy due to lumbar intervertebral disc disorder  M51.16     5. Pain in left hip  M25.552     6. Pain in right hip  M25.551  Plan: Findings:  1.  Chronic long-term history of back pain with history of lumbar surgery and chronic pain management over the years now with several month onset of acute onset of pain into the right hip and leg which is somewhat confusing from a pain pattern.  It seems to be clearly radicular in nature to a degree down the leg.  MRI does not show anything really at L5-S1 which would fit more with a pattern down the leg but she does have lateral recess narrowing at L3-4 which could affect the nerves downstream.  This is actually equal left and right.  She does have disc protrusion here but nothing focal.  She also is getting pain into the hip which is stated below.  No hip films.  No real pain with rotation today however.  We did elect to complete diagnostic and therapeutic L3-4 interlaminar  injection above the level of surgery.  Would consider transforaminal approach.  Would consider regrouping with physical therapy depending on relief.  2.  Right hip and groin pain.  No real pain with rotation although could be some type of the labral tear issue.  No recent films.  At this point not looking at getting a picture and less she just continues to declare itself more in the groin with movement.  We will keep an eye on this along with Dr. Marlou Sa.    Meds & Orders:  Meds ordered this encounter  Medications   methylPREDNISolone acetate (DEPO-MEDROL) injection 40 mg    Orders Placed This Encounter  Procedures   XR C-ARM NO REPORT   Epidural Steroid injection    Follow-up: Return if symptoms worsen or fail to improve.   Procedures: No procedures performed  Lumbar Epidural Steroid Injection - Interlaminar Approach with Fluoroscopic Guidance  Patient: Haley Wade      Date of Birth: Apr 19, 1946 MRN: [id number] PCP: Shon Baton, MD      Visit Date: 05/15/2022   Universal Protocol:     Consent Given By: the patient  Position: PRONE  Additional Comments: Vital signs were monitored before and after the procedure. Patient was prepped and draped in the usual sterile fashion. The correct patient, procedure, and site was verified.   Injection Procedure Details:   Procedure diagnoses: Lumbar radiculopathy [M54.16]   Meds Administered:  Meds ordered this encounter  Medications   methylPREDNISolone acetate (DEPO-MEDROL) injection 40 mg     Laterality: Right  Location/Site:  L3-4  Needle: 3.5 in., 20 ga. Tuohy  Needle Placement: Paramedian epidural  Findings:   -Comments: Excellent flow of contrast into the epidural space.  Procedure Details: Using a paramedian approach from the side mentioned above, the region overlying the inferior lamina was localized under fluoroscopic visualization and the soft tissues overlying this structure were infiltrated with 4  ml. of 1% Lidocaine without Epinephrine. The Tuohy needle was inserted into the epidural space using a paramedian approach.   The epidural space was localized using loss of resistance along with counter oblique bi-planar fluoroscopic views.  After negative aspirate for air, blood, and CSF, a 2 ml. volume of Isovue-250 was injected into the epidural space and the flow of contrast was observed. Radiographs were obtained for documentation purposes.    The injectate was administered into the level noted above.   Additional Comments:  The patient tolerated the procedure well Dressing: 2 x 2 sterile gauze and Band-Aid    Post-procedure details: Patient was observed during the procedure. Post-procedure instructions were reviewed.  Patient left the clinic in stable condition.   Clinical History: MRI LUMBAR SPINE WITHOUT CONTRAST   TECHNIQUE: Multiplanar, multisequence MR imaging of the lumbar spine was performed. No intravenous contrast was administered.   COMPARISON:  None Available.   FINDINGS: Segmentation:  Standard.   Alignment:  2 mm anterolisthesis of L3 on L4.   Vertebrae: No acute fracture, evidence of discitis, or aggressive bone lesion.   Conus medullaris and cauda equina: Conus extends to the L1 level. Conus and cauda equina appear normal.   Paraspinal and other soft tissues: No acute paraspinal abnormality. 2 cm right hepatic cyst again noted better seen on CT abdomen/pelvis dated 09/16/2021.   Disc levels:   Disc spaces: Degenerative disease with disc height loss at L3-4 and more severe at L4-5 and L5-S1. Disc desiccation throughout the remainder of the lumbar spine.   T12-L1: No significant disc bulge. No neural foraminal stenosis. No central canal stenosis.   L1-L2: Minimal broad-based disc bulge. No foraminal or central canal stenosis.   L2-L3: Mild broad-based disc bulge. Mild bilateral facet arthropathy. Mild spinal stenosis. No foraminal or central  stenosis.   L3-L4: Minimal broad-based disc bulge with a broad shallow left paracentral/foraminal disc protrusion. Mild bilateral facet arthropathy. Mild spinal stenosis. Bilateral lateral recess narrowing.   L4-L5: Broad-based disc bulge. No foraminal or central canal stenosis.   L5-S1: Mild broad-based disc bulge. Mild bilateral foraminal stenosis. No spinal stenosis.   IMPRESSION: 1. Lumbar spine spondylosis as described above. 2.  No acute osseous injury of the lumbar spine.     Electronically Signed   By: Kathreen Devoid M.D.   On: 04/24/2022 09:21   She reports that she quit smoking about 41 years ago. Her smoking use included cigarettes. She has never used smokeless tobacco. No results for input(s): "HGBA1C", "LABURIC" in the last 8760 hours.  Objective:  VS:  HT:4' 11.5" (151.1 cm)   WT:98 lb (44.5 kg)  BMI:19.47    BP:118/75  HR:83bpm  TEMP: ( )  RESP:  Physical Exam  Ortho Exam  Imaging: No results found.  Past Medical/Family/Surgical/Social History: Medications & Allergies reviewed per EMR, new medications updated. Patient Active Problem List   Diagnosis Date Noted   Chronic hyponatremia 11/17/2021   Generalized weakness 11/17/2021   GAD (generalized anxiety disorder) 11/17/2021   Syncope 11/17/2021   GAVE (gastric antral vascular ectasia) 11/17/2021   Inflammatory arthritis 11/16/2021   Complete tear of right rotator cuff    Biceps tendonitis on right    S/P rotator cuff repair 02/08/2021   Chest discomfort 01/26/2014   Palpitations 01/26/2014   Cellulitis 09/08/2013   Abdominal pain, left mid abdomen, chronic 02/26/2013   Hyponatremia 10/30/2012   COPD, questioned 01/02/2012   Allergic rhinitis due to pollen 10/20/2010   MITRAL VALVE PROLAPSE, HX OF 05/26/2009   UNSPECIFIED HEARING LOSS 05/21/2009   RHINOSINUSITIS, 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   Past Medical History:  Diagnosis Date   Allergic rhinitis, cause unspecified    Anemia    Arthritis     Cellulitis    Chest discomfort    COPD, questioned    GAVE (gastric antral vascular ectasia)    Hyponatremia    MITRAL VALVE PROLAPSE, HX OF    Other chronic sinusitis    Palpitations    Syncope    Unspecified hearing loss    Family History  Problem Relation Age of Onset   Other Mother        c difficile gastroenteritis   Lung  cancer Father        smoker   Liver disease Brother    Liver cancer Brother    Asthma Other        grandmother   Other Other        bronchitis-grandmother   Past Surgical History:  Procedure Laterality Date   APPENDECTOMY     BACK SURGERY     BICEPT TENODESIS Right 02/08/2021   Procedure: BICEPS TENODESIS;  Surgeon: Meredith Pel, MD;  Location: [place];  Service: Orthopedics;  Laterality: Right;   BLADDER SURGERY     BOWEL RESECTION N/A 11/01/2012   Procedure: SMALL BOWEL RESECTION;  Surgeon: Earnstine Regal, MD;  Location: [facility];  Service: General;  Laterality: N/A;   BREAST ENHANCEMENT SURGERY     revisions   BREAST SURGERY     CHOLECYSTECTOMY     COLON SURGERY     ESOPHAGOGASTRODUODENOSCOPY (EGD) WITH PROPOFOL N/A 10/14/2019   Procedure: ESOPHAGOGASTRODUODENOSCOPY (EGD) WITH PROPOFOL;  Surgeon: Clarene Essex, MD;  Location: [facility];  Service: Endoscopy;  Laterality: N/A;   EYE SURGERY     GI RADIOFREQUENCY ABLATION  10/14/2019   Procedure: GI RADIOFREQUENCY ABLATION;  Surgeon: Clarene Essex, MD;  Location: [facility];  Service: Endoscopy;;   LAPAROSCOPY N/A 11/01/2012   Procedure: LAPAROSCOPY DIAGNOSTIC;  Surgeon: Earnstine Regal, MD;  Location: [facility];  Service: General;  Laterality: N/A;   LAPAROTOMY N/A 11/01/2012   Procedure: EXPLORATORY LAPAROTOMY;  Surgeon: Earnstine Regal, MD;  Location: [facility];  Service: General;  Laterality: N/A;   LYSIS OF ADHESION N/A 11/01/2012   Procedure: LYSIS OF ADHESION;  Surgeon: Earnstine Regal, MD;  Location: [facility];  Service: General;  Laterality: N/A;   RHINOPLASTY     SHOULDER ARTHROSCOPY WITH OPEN  ROTATOR CUFF REPAIR AND DISTAL CLAVICLE ACROMINECTOMY Right 02/08/2021   Procedure: SHOULDER ARTHROSCOPY WITH BICEPS TENDON RELEASE, MINI OPEN ROTATOR CUFF TEAR REPAIR OF THE INFRASPINATUS SUPRASPINATUS AND UPPER PORTIION OF THE SUBSCAP  WITH BICEPS TENODESIS;  Surgeon: Meredith Pel, MD;  Location: [place];  Service: Orthopedics;  Laterality: Right;   TONSILLECTOMY     TOTAL ABDOMINAL HYSTERECTOMY     Social History   Occupational History   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

## 2022-05-22 ENCOUNTER — Ambulatory Visit: Payer: Medicare Other | Admitting: Psychiatry

## 2022-05-22 DIAGNOSIS — M1990 Unspecified osteoarthritis, unspecified site: Secondary | ICD-10-CM | POA: Diagnosis not present

## 2022-05-22 DIAGNOSIS — M154 Erosive (osteo)arthritis: Secondary | ICD-10-CM | POA: Diagnosis not present

## 2022-05-22 DIAGNOSIS — K31819 Angiodysplasia of stomach and duodenum without bleeding: Secondary | ICD-10-CM | POA: Diagnosis not present

## 2022-05-22 DIAGNOSIS — M8580 Other specified disorders of bone density and structure, unspecified site: Secondary | ICD-10-CM | POA: Diagnosis not present

## 2022-05-22 DIAGNOSIS — D649 Anemia, unspecified: Secondary | ICD-10-CM | POA: Diagnosis not present

## 2022-05-22 DIAGNOSIS — M5380 Other specified dorsopathies, site unspecified: Secondary | ICD-10-CM | POA: Diagnosis not present

## 2022-05-22 DIAGNOSIS — M79643 Pain in unspecified hand: Secondary | ICD-10-CM | POA: Diagnosis not present

## 2022-05-22 DIAGNOSIS — F419 Anxiety disorder, unspecified: Secondary | ICD-10-CM | POA: Diagnosis not present

## 2022-05-22 DIAGNOSIS — N329 Bladder disorder, unspecified: Secondary | ICD-10-CM | POA: Diagnosis not present

## 2022-05-22 DIAGNOSIS — K5900 Constipation, unspecified: Secondary | ICD-10-CM | POA: Diagnosis not present

## 2022-05-22 DIAGNOSIS — K219 Gastro-esophageal reflux disease without esophagitis: Secondary | ICD-10-CM | POA: Diagnosis not present

## 2022-05-22 DIAGNOSIS — E559 Vitamin D deficiency, unspecified: Secondary | ICD-10-CM | POA: Diagnosis not present

## 2022-06-10 DIAGNOSIS — Z23 Encounter for immunization: Secondary | ICD-10-CM | POA: Diagnosis not present

## 2022-06-12 DIAGNOSIS — Z79899 Other long term (current) drug therapy: Secondary | ICD-10-CM | POA: Diagnosis not present

## 2022-06-12 DIAGNOSIS — L821 Other seborrheic keratosis: Secondary | ICD-10-CM | POA: Diagnosis not present

## 2022-06-12 DIAGNOSIS — L650 Telogen effluvium: Secondary | ICD-10-CM | POA: Diagnosis not present

## 2022-06-12 DIAGNOSIS — L658 Other specified nonscarring hair loss: Secondary | ICD-10-CM | POA: Diagnosis not present

## 2022-07-10 ENCOUNTER — Ambulatory Visit (INDEPENDENT_AMBULATORY_CARE_PROVIDER_SITE_OTHER): Payer: Medicare Other | Admitting: Psychiatry

## 2022-07-10 ENCOUNTER — Encounter: Payer: Self-pay | Admitting: Psychiatry

## 2022-07-10 DIAGNOSIS — F411 Generalized anxiety disorder: Secondary | ICD-10-CM

## 2022-07-10 DIAGNOSIS — F5105 Insomnia due to other mental disorder: Secondary | ICD-10-CM | POA: Diagnosis not present

## 2022-07-10 NOTE — Progress Notes (Signed)
Haley Wade [id number] [birth year] 76 y.o.  Subjective:   Patient ID:  Haley Wade is a 76 y.o. (DOB 04-29-1946) female.  Chief Complaint:  Chief Complaint  Patient presents with   Follow-up   Anxiety   Sleeping Problem    HPI Haley Wade presents to the office today for follow-up of anxiety and insomnia.  Challenging winter with health issues with GI problems.  Heart monitor and cardiac workup with palpitations.  Fall last week after starting morning walk and tripped.  Problems with EMA and doesn't want to get used to taking zaleplon and about 3-4 times per week.  Has used imagery and other techniques to help sleep. 1 and 1/2 cup coffee am only. No hangover with zaleplon.  Weaned off lorazepam HS and then only prn.  Last used 02/08/21. No panic lately. And anxiety managed. Patient reports stable mood and denies depressed or irritable moods.  Patient denies any recent difficulty with anxiety.  Denies appetite disturbance.  Patient reports that energy and motivation have been good.  Patient denies any difficulty with concentration.  Patient denies any suicidal ideation.  11/21/21 appt noted: Biggest issue is sleep.  Trazoadone SE constipation and hair loss.  Using zaleplon intermittently doesn't work well. Underlying stress.  Getting married.  Not sure what to do about some stress over it.. SE Mirtazapine would never take it. Wants to take intermittent lorazepam. Pressure in her head with normal BP and on her scalp with stinging and itching or burning or pain behind right eye sometimes. Working 3 days weekly.  07/10/2022 appointment noted: Still on sonata 5 mg prn.  No SE.  No hangover. Most of the time sleeps well. Married and that makes sleep easier.    They moved into house together.  Happy . Plans to retire for the last time. Lives at [facility] and loves it. Not much anxiety now.   Situation better has changed her life for the better  without depression or anxiety. Both strong Christians.  Devotions together.     Past Psychiatric Medication Trials: Past Psychiatric History:  Long history of counseling Remote antidepressants without help and Seroquel NR & SE. On lorazepam for years. SE sometimes hair loss.  But still the best. Alprazolam short duration.  Klonopin SE. SE Mirtazapine would never take it.   Flowsheet Row ED from 04/11/2022 in [facility] ED from 03/09/2022 in [facility] ED from 01/23/2022 in [facility]  C-SSRS RISK CATEGORY No Risk No Risk No Risk        Review of Systems:  Review of Systems  Cardiovascular:  Positive for palpitations.  Neurological:  Negative for tremors.  Psychiatric/Behavioral:  Positive for sleep disturbance. Negative for dysphoric mood.     Medications: I have reviewed the patient's current medications.  Current Outpatient Medications  Medication Sig Dispense Refill   acyclovir (ZOVIRAX) 200 MG capsule Take 200 mg by mouth daily.     estrogens, conjugated, (PREMARIN) 0.625 MG tablet Take 0.625 mg by mouth daily.      FERREX 150 150 MG capsule Take 150 mg by mouth daily with breakfast.     finasteride (PROSCAR) 5 MG tablet Take 5 mg by mouth daily.     fluconazole (DIFLUCAN) 150 MG tablet Take 150 mg by mouth daily.     fluticasone (FLONASE) 50 MCG/ACT nasal spray Place 2 sprays into both nostrils daily as needed for allergies or rhinitis.  HYDROcodone-acetaminophen (NORCO/VICODIN) 5-325 MG tablet Take 1 tablet by mouth every 4 (four) hours as needed for moderate pain. (Patient not taking: Reported on 05/15/2022) 30 tablet 0   ibuprofen (ADVIL) 800 MG tablet Take 1 tablet (800 mg total) by mouth every 8 (eight) hours as needed (for pain). 30 tablet 0   lidocaine (LIDODERM) 5 % Place 1 patch onto the skin daily. Remove & Discard patch within 12 hours or as directed by MD 30 patch 0    Methen-Bella-Meth Bl-Phen Sal (URISED PO) Take 1 tablet by mouth daily as needed (for bladder spasms).     methocarbamol (ROBAXIN) 500 MG tablet Take 1 tablet (500 mg total) by mouth every 8 (eight) hours as needed for muscle spasms. 30 tablet 1   omeprazole (PRILOSEC) 20 MG capsule Take 20 mg by mouth daily as needed (for reflux).     spironolactone (ALDACTONE) 50 MG tablet Take 1 tablet (50 mg total) by mouth daily. (Patient taking differently: Take 50 mg by mouth 3 (three) times daily.)     Vitamin D, Ergocalciferol, (DRISDOL) 1.25 MG (50000 UNIT) CAPS capsule Take 50,000 Units by mouth every Saturday.     zaleplon (SONATA) 10 MG capsule TAKE ONE CAPSULE AT BEDTIME AS NEEDED FOR SLEEP (Patient taking differently: Take 10 mg by mouth at bedtime as needed.) 30 capsule 0   No current facility-administered medications for this visit.    Medication Side Effects: None  Allergies:  Allergies  Allergen Reactions   Plaquenil [Hydroxychloroquine Sulfate] Rash and Other (See Comments)    "Covered all over in a rash and welts"   Bee Venom Swelling and Other (See Comments)    Swelling at site where stung   Terconazole Nausea Only and Other (See Comments)    Headaches, also    Past Medical History:  Diagnosis Date   Allergic rhinitis, cause unspecified    Anemia    Arthritis    Cellulitis    Chest discomfort    COPD, questioned    GAVE (gastric antral vascular ectasia)    Hyponatremia    MITRAL VALVE PROLAPSE, HX OF    Other chronic sinusitis    Palpitations    Syncope    Unspecified hearing loss     Past Medical History, Surgical history, Social history, and Family history were reviewed and updated as appropriate.   Please see review of systems for further details on the patient's review from today.   Objective:   Physical Exam:  There were no vitals taken for this visit.  Physical Exam Constitutional:      General: She is not in acute distress. Genitourinary:    Rectum:  Normal.  Musculoskeletal:        General: No deformity.  Neurological:     Mental Status: She is alert and oriented to person, place, and time.     Coordination: Coordination normal.  Psychiatric:        Attention and Perception: Attention and perception normal. She does not perceive auditory or visual hallucinations.        Mood and Affect: Mood is not anxious or depressed. Affect is not labile, blunt, angry or inappropriate.        Speech: Speech normal. Speech is not slurred.        Behavior: Behavior normal.        Thought Content: Thought content normal. Thought content is not paranoid or delusional. Thought content does not include homicidal or suicidal ideation. Thought content does not include  suicidal plan.        Cognition and Memory: Cognition and memory normal.        Judgment: Judgment normal.     Comments: Insight intact Anxiety is better after marriage.     Lab Review:     Component Value Date/Time   NA 135 01/23/2022 0735   K 5.0 01/23/2022 0735   CL 101 01/23/2022 0735   CO2 24 01/23/2022 0735   GLUCOSE 93 01/23/2022 0735   BUN 24 (H) 01/23/2022 0735   CREATININE 0.64 01/23/2022 0735   CALCIUM 9.5 01/23/2022 0735   PROT 5.9 (L) 11/17/2021 0009   ALBUMIN 3.0 (L) 11/17/2021 0009   AST 11 (L) 11/17/2021 0009   ALT 14 11/17/2021 0009   ALKPHOS 35 (L) 11/17/2021 0009   BILITOT 0.5 11/17/2021 0009   GFRNONAA >60 01/23/2022 0735   GFRAA >90 09/09/2013 0617       Component Value Date/Time   WBC 8.9 01/23/2022 0735   RBC 4.37 01/23/2022 0735   HGB 12.5 01/23/2022 0735   HCT 38.4 01/23/2022 0735   PLT 255 01/23/2022 0735   MCV 87.9 01/23/2022 0735   MCH 28.6 01/23/2022 0735   MCHC 32.6 01/23/2022 0735   RDW 13.4 01/23/2022 0735   LYMPHSABS 1.7 01/23/2022 0735   MONOABS 0.7 01/23/2022 0735   EOSABS 0.1 01/23/2022 0735   BASOSABS 0.0 01/23/2022 0735    No results found for: "POCLITH", "LITHIUM"   No results found for: "PHENYTOIN", "PHENOBARB",  "VALPROATE", "CBMZ"   .res Assessment: Plan:    Haley Wade was seen today for follow-up, anxiety and sleeping problem.  Diagnoses and all orders for this visit:  Insomnia due to mental condition  Generalized anxiety disorder    very med sensitive. Been able to wean off lorazepam and anxiety controlled.  Still sleep issues half the time.  Pleased with Sonata prn. Disc risk of zaleplon and risk falls etc.  We discussed the short-term risks associated with benzodiazepines including sedation and increased fall risk among others.  Discussed long-term side effect risk including dependence, potential withdrawal symptoms, and the potential eventual dose-related risk of dementia.  But recent studies from 2020 dispute this association between benzodiazepines and dementia risk. Newer studies in 2020 do not support an association with dementia.  Disc this in great detail bc she had questions, as well as alternatives like trazodone or melatonin.  Also disc CBT for sleep. She wants to stick with what she knows and feels comfortable with it.  FU 12 mos since only rare use Sonata  Lynder Parents, MD, DFAPA   Please see After Visit Summary for patient specific instructions.  Future Appointments  Date Time Provider [place]  10/25/2022  8:20 AM Bo Merino, MD [facility] None  11/15/2022  1:00 PM Bo Merino, MD [facility] None    No orders of the defined types were placed in this encounter.   -------------------------------

## 2022-07-17 DIAGNOSIS — N390 Urinary tract infection, site not specified: Secondary | ICD-10-CM | POA: Diagnosis not present

## 2022-07-17 DIAGNOSIS — N393 Stress incontinence (female) (male): Secondary | ICD-10-CM | POA: Diagnosis not present

## 2022-07-28 DIAGNOSIS — H26493 Other secondary cataract, bilateral: Secondary | ICD-10-CM | POA: Diagnosis not present

## 2022-08-09 DIAGNOSIS — H26491 Other secondary cataract, right eye: Secondary | ICD-10-CM | POA: Diagnosis not present

## 2022-08-16 DIAGNOSIS — H26492 Other secondary cataract, left eye: Secondary | ICD-10-CM | POA: Diagnosis not present

## 2022-08-18 ENCOUNTER — Other Ambulatory Visit (HOSPITAL_BASED_OUTPATIENT_CLINIC_OR_DEPARTMENT_OTHER): Payer: Self-pay

## 2022-08-18 MED ORDER — COMIRNATY 30 MCG/0.3ML IM SUSY
PREFILLED_SYRINGE | INTRAMUSCULAR | 0 refills | Status: DC
  Filled 2022-08-18: qty 0.3, 1d supply, fill #0

## 2022-09-04 DIAGNOSIS — Z124 Encounter for screening for malignant neoplasm of cervix: Secondary | ICD-10-CM | POA: Diagnosis not present

## 2022-09-04 DIAGNOSIS — Z01419 Encounter for gynecological examination (general) (routine) without abnormal findings: Secondary | ICD-10-CM | POA: Diagnosis not present

## 2022-09-04 DIAGNOSIS — Z79890 Hormone replacement therapy (postmenopausal): Secondary | ICD-10-CM | POA: Diagnosis not present

## 2022-09-04 DIAGNOSIS — Z9079 Acquired absence of other genital organ(s): Secondary | ICD-10-CM | POA: Diagnosis not present

## 2022-09-04 DIAGNOSIS — Z01411 Encounter for gynecological examination (general) (routine) with abnormal findings: Secondary | ICD-10-CM | POA: Diagnosis not present

## 2022-09-04 DIAGNOSIS — R300 Dysuria: Secondary | ICD-10-CM | POA: Diagnosis not present

## 2022-09-18 DIAGNOSIS — R1012 Left upper quadrant pain: Secondary | ICD-10-CM | POA: Diagnosis not present

## 2022-09-18 DIAGNOSIS — K5900 Constipation, unspecified: Secondary | ICD-10-CM | POA: Diagnosis not present

## 2022-09-18 DIAGNOSIS — I700 Atherosclerosis of aorta: Secondary | ICD-10-CM | POA: Diagnosis not present

## 2022-09-18 DIAGNOSIS — R140 Abdominal distension (gaseous): Secondary | ICD-10-CM | POA: Diagnosis not present

## 2022-09-18 DIAGNOSIS — N329 Bladder disorder, unspecified: Secondary | ICD-10-CM | POA: Diagnosis not present

## 2022-09-18 DIAGNOSIS — D649 Anemia, unspecified: Secondary | ICD-10-CM | POA: Diagnosis not present

## 2022-09-18 DIAGNOSIS — K31819 Angiodysplasia of stomach and duodenum without bleeding: Secondary | ICD-10-CM | POA: Diagnosis not present

## 2022-09-18 DIAGNOSIS — K219 Gastro-esophageal reflux disease without esophagitis: Secondary | ICD-10-CM | POA: Diagnosis not present

## 2022-09-25 DIAGNOSIS — R3914 Feeling of incomplete bladder emptying: Secondary | ICD-10-CM | POA: Diagnosis not present

## 2022-09-25 DIAGNOSIS — N3946 Mixed incontinence: Secondary | ICD-10-CM | POA: Diagnosis not present

## 2022-10-11 DIAGNOSIS — L219 Seborrheic dermatitis, unspecified: Secondary | ICD-10-CM | POA: Diagnosis not present

## 2022-10-11 DIAGNOSIS — Z1283 Encounter for screening for malignant neoplasm of skin: Secondary | ICD-10-CM | POA: Diagnosis not present

## 2022-10-11 DIAGNOSIS — L299 Pruritus, unspecified: Secondary | ICD-10-CM | POA: Diagnosis not present

## 2022-10-11 DIAGNOSIS — L658 Other specified nonscarring hair loss: Secondary | ICD-10-CM | POA: Diagnosis not present

## 2022-10-11 DIAGNOSIS — D1801 Hemangioma of skin and subcutaneous tissue: Secondary | ICD-10-CM | POA: Diagnosis not present

## 2022-10-11 DIAGNOSIS — L089 Local infection of the skin and subcutaneous tissue, unspecified: Secondary | ICD-10-CM | POA: Diagnosis not present

## 2022-10-11 NOTE — Progress Notes (Signed)
Office Visit Note  Patient: Haley Wade             Date of Birth: [birth year]           MRN: [id number]             PCP: Shon Baton, MD Referring: Shon Baton, MD Visit Date: 10/25/2022 Occupation: '@GUAROCC'$ @  Subjective:  Pain in multiple joints  History of Present Illness: Haley Wade is a 77 y.o. female in consultation per request of her PCP.  According the patient her symptoms started in 2005 with pain and swelling in her bilateral hands.  At that time she tried different NSAIDs and then stopped because of GI side effects.  She was under care of [facility], Dr. Harrington Challenger.  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  She was treated with hydroxychloroquine but it was discontinued due to headache nausea and later diffuse rash.  She was evaluated by dermatology and was diagnosed with drug-related allergy.  She did well on low-dose prednisone per records.  She was offered methotrexate but decided against it.  She was also on meloxicam 7.5 mg p.o. daily as needed.  She eventually stopped it due to side effects.  She had PIP injections in the past by the rheumatologist.  She takes ibuprofen occasionally.  She was also evaluated by Dr. Burney Gauze who did not recommend any further intervention for her hands.  She has been under care of Dr. Marlou Sa for many years.  She had right rotator cuff tear repair and clavicle debridement in the past.  No problems with the right shoulder now.  She also has severe osteoarthritis in her bilateral knee joints.  Patient had cortisone injections followed by viscosupplement injections.  She states she was doing well on viscosupplement injections until she had allergic reaction to the viscosupplement injection in April 2023 and then she stopped having injections.  She continues to have some discomfort in her knee joints.  Besides her hands none of the other joints are painful currently.  She also have  disc disease in the lumbar spine she does not have much pain in her lower back.  She was also diagnosed with osteopenia based on the DEXA scan in 2017.  She takes vitamin D.    Activities of Daily Living:  Patient reports morning stiffness for 15-20 minutes.   Patient Denies nocturnal pain.  Difficulty dressing/grooming: Denies Difficulty climbing stairs: Denies Difficulty getting out of chair: Denies Difficulty using hands for taps, buttons, cutlery, and/or writing: Reports  Review of Systems  Constitutional:  Negative for fatigue.  HENT:  Positive for mouth dryness. Negative for mouth sores.   Eyes:  Positive for dryness.  Respiratory:  Negative for shortness of breath.   Cardiovascular:  Positive for palpitations. Negative for chest pain.  Gastrointestinal:  Negative for blood in stool, constipation and diarrhea.  Endocrine: Negative for increased urination.  Genitourinary:  Negative for involuntary urination.  Musculoskeletal:  Positive for joint pain, joint pain, joint swelling and morning stiffness. Negative for gait problem, myalgias, muscle weakness, muscle tenderness and myalgias.  Skin:  Positive for hair loss. Negative for color change, rash and sensitivity to sunlight.  Allergic/Immunologic: Negative for susceptible to infections.  Neurological:  Negative for dizziness and headaches.  Hematological:  Negative for swollen glands.  Psychiatric/Behavioral:  Negative for depressed mood and sleep disturbance. The patient is not nervous/anxious.     PMFS History:  Patient Active Problem List  Diagnosis Date Noted   Chronic hyponatremia 11/17/2021   Generalized weakness 11/17/2021   GAD (generalized anxiety disorder) 11/17/2021   Syncope 11/17/2021   GAVE (gastric antral vascular ectasia) 11/17/2021   Inflammatory arthritis 11/16/2021   Complete tear of right rotator cuff    Biceps tendonitis on right    S/P rotator cuff repair 02/08/2021   Chest discomfort 01/26/2014    Palpitations 01/26/2014   Cellulitis 09/08/2013   Abdominal pain, left mid abdomen, chronic 02/26/2013   Hyponatremia 10/30/2012   COPD, questioned 01/02/2012   Allergic rhinitis due to pollen 10/20/2010   MITRAL VALVE PROLAPSE, HX OF 05/26/2009   UNSPECIFIED HEARING LOSS 05/21/2009   RHINOSINUSITIS, 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    Past Medical History:  Diagnosis Date   Allergic rhinitis, cause unspecified    Anemia    Arthritis    Cellulitis    Chest discomfort    COPD, questioned    GAVE (gastric antral vascular ectasia)    Hyponatremia    MITRAL VALVE PROLAPSE, HX OF    Other chronic sinusitis    Palpitations    Syncope    Unspecified hearing loss     Family History  Problem Relation Age of Onset   Other Mother        c difficile gastroenteritis   Lung cancer Father        smoker   Liver disease Brother    Liver cancer Brother    Asthma Other        grandmother   Other Other        bronchitis-grandmother   Past Surgical History:  Procedure Laterality Date   APPENDECTOMY     BACK SURGERY     BICEPT TENODESIS Right 02/08/2021   Procedure: BICEPS TENODESIS;  Surgeon: Meredith Pel, MD;  Location: [facility];  Service: Orthopedics;  Laterality: Right;   BLADDER SURGERY     BOWEL RESECTION N/A 11/01/2012   Procedure: SMALL BOWEL RESECTION;  Surgeon: Earnstine Regal, MD;  Location: [facility];  Service: General;  Laterality: N/A;   BREAST ENHANCEMENT SURGERY     revisions   BREAST SURGERY     CHOLECYSTECTOMY     COLON SURGERY     ESOPHAGOGASTRODUODENOSCOPY (EGD) WITH PROPOFOL N/A 10/14/2019   Procedure: ESOPHAGOGASTRODUODENOSCOPY (EGD) WITH PROPOFOL;  Surgeon: Clarene Essex, MD;  Location: [facility];  Service: Endoscopy;  Laterality: N/A;   EYE SURGERY     GI RADIOFREQUENCY ABLATION  10/14/2019   Procedure: GI RADIOFREQUENCY ABLATION;  Surgeon: Clarene Essex, MD;  Location: [facility];  Service: Endoscopy;;   LAPAROSCOPY N/A 11/01/2012   Procedure: LAPAROSCOPY DIAGNOSTIC;   Surgeon: Earnstine Regal, MD;  Location: [facility];  Service: General;  Laterality: N/A;   LAPAROTOMY N/A 11/01/2012   Procedure: EXPLORATORY LAPAROTOMY;  Surgeon: Earnstine Regal, MD;  Location: [facility];  Service: General;  Laterality: N/A;   LYSIS OF ADHESION N/A 11/01/2012   Procedure: LYSIS OF ADHESION;  Surgeon: Earnstine Regal, MD;  Location: [facility];  Service: General;  Laterality: N/A;   RHINOPLASTY     SHOULDER ARTHROSCOPY WITH OPEN ROTATOR CUFF REPAIR AND DISTAL CLAVICLE ACROMINECTOMY Right 02/08/2021   Procedure: SHOULDER ARTHROSCOPY WITH BICEPS TENDON RELEASE, MINI OPEN ROTATOR CUFF TEAR REPAIR OF THE INFRASPINATUS SUPRASPINATUS AND UPPER PORTIION OF THE SUBSCAP  WITH BICEPS TENODESIS;  Surgeon: Meredith Pel, MD;  Location: [facility];  Service: Orthopedics;  Laterality: Right;   TONSILLECTOMY     TOTAL ABDOMINAL HYSTERECTOMY  Social History   Social History Narrative   Not on file   Immunization History  Administered Date(s) Administered   COVID-19, mRNA, vaccine(Comirnaty)12 years and older 08/18/2022   Influenza Split 06/26/2011, 06/25/2012, 06/28/2013   Tdap 09/08/2013, 04/13/2021     Objective: Vital Signs: BP 130/79 (BP Location: Right Arm, Patient Position: Sitting, Cuff Size: Normal)   Pulse 71   Resp 14   Ht 4' 11.5" (1.511 m)   Wt 102 lb (46.3 kg)   BMI 20.26 kg/m    Physical Exam Vitals and nursing note reviewed.  Constitutional:      Appearance: She is well-developed.  HENT:     Head: Normocephalic and atraumatic.  Eyes:     Conjunctiva/sclera: Conjunctivae normal.  Cardiovascular:     Rate and Rhythm: Normal rate and regular rhythm.     Heart sounds: Normal heart sounds.  Pulmonary:     Effort: Pulmonary effort is normal.     Breath sounds: Normal breath sounds.  Abdominal:     General: Bowel sounds are normal.     Palpations: Abdomen is soft.  Musculoskeletal:     Cervical back: Normal range of motion.  Lymphadenopathy:     Cervical: No cervical  adenopathy.  Skin:    General: Skin is warm and dry.     Capillary Refill: Capillary refill takes less than 2 seconds.  Neurological:     Mental Status: She is alert and oriented to person, place, and time.  Psychiatric:        Behavior: Behavior normal.      Musculoskeletal Exam: She had limited lateral rotation of the cervical spine with minimal discomfort.  Thoracolumbar scoliosis was noted.  She had no discomfort range of motion of her lumbar spine.  There was no tenderness over SI joints.  Shoulder joints, elbow joints, wrist joints were in good range of motion with no synovitis.  There was no synovitis over MCP joints.  She had inflammation in bilateral PIP joints.  There was no DIP synovitis noted.  Hip joints and knee joints were in good range of motion without any warmth swelling or effusion.  Dorsal spurs were noted.  No MTP synovitis or PIP or DIP thickening was noted.  CDAI Exam: CDAI Score: -- Patient Global: --; Provider Global: -- Swollen: --; Tender: -- Joint Exam 10/25/2022   No joint exam has been documented for this visit   There is currently no information documented on the homunculus. Go to the Rheumatology activity and complete the homunculus joint exam.  Investigation: No additional findings.  Imaging: No results found.  Recent Labs: Lab Results  Component Value Date   WBC 8.9 01/23/2022   HGB 12.5 01/23/2022   PLT 255 01/23/2022   NA 135 01/23/2022   K 5.0 01/23/2022   CL 101 01/23/2022   CO2 24 01/23/2022   GLUCOSE 93 01/23/2022   BUN 24 (H) 01/23/2022   CREATININE 0.64 01/23/2022   BILITOT 0.5 11/17/2021   ALKPHOS 35 (L) 11/17/2021   AST 11 (L) 11/17/2021   ALT 14 11/17/2021   PROT 5.9 (L) 11/17/2021   ALBUMIN 3.0 (L) 11/17/2021   CALCIUM 9.5 01/23/2022   GFRAA >90 09/09/2013   February 28, 2016 DEXA scan T-score -2.0 right femoral neck  Speciality Comments: No specialty comments available.  Procedures:  No procedures performed Allergies:  Plaquenil [hydroxychloroquine sulfate], Bee venom, and Terconazole   Assessment / Plan:     Visit Diagnoses: Inflammatory arthritis - Previous patient of  [facility].  Last visit 04/2021 -was given upon above diagnosis of osteoarthritis versus psoriatic arthritis.  She was given Plaquenil which caused rash.  She was offered methotrexate but patient declined.  I noticed inflammation in all of her PIP joints.  There was no inflammation in her DIP joints, MCPs or wrist joints.  Patient states she has had swelling in the wrist joint in the past.  She was also diagnosed with pseudogout recently.  Her symptoms are consistent with pseudogout.  I will obtain following labs today.  She cannot tolerate NSAIDs due to GI side effects.  I discussed possible option of colchicine in the future.  I reviewed the medication list with the pharmacist.  If there are no contraindications to colchicine.  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  She gives history of intermittent inflammatory arthritis.  High risk medication use - Plaquenil-discontinued due to rash.  Treated with low-dose prednisone by previous rheumatologist - Plan: CBC with Differential/Platelet, COMPLETE METABOLIC PANEL WITH GFR  Primary osteoarthritis of both hands -  osteoarthritis and chondrocalcinosis reported in x-rays of wrist 2023.  She had right Perry surgery in the past by Dr. Burney Gauze.- Plan: XR Hand 2 View Right, XR Hand 2 View Left.  X-rays of bilateral hands obtained today were suggestive of severe osteoarthritis and pseudogout.  Subluxation of the PIP joint with soft tissue swelling was noted.  A handout on exercises was given.  S/P rotator cuff repair-right by Dr. Marlou Sa.  Patient states she also needed debridement of the clavicle.  She had good range of  motion of her right shoulder joint today.  Primary osteoarthritis of both knees-patient gives history of severe osteoarthritis in her knee joints.  I reviewed x-rays of her right knee joint from 2023 which showed mild osteoarthritis and chondrocalcinosis.  She had moderate Ro malacia patella.  X-rays of her left knee joint from August 2022 showed moderate osteoarthritis and chondromalacia patella.  Chondrocalcinosis was noted in bilateral knee joints.  Patient had good response to cortisone injections in the past.  She was also getting viscosupplement injections which has been helpful.  She had a reaction to viscosupplement injection last year and then she stopped getting viscosupplement injections.  She would benefit from lower extremity muscle strengthening exercises.  A handout on exercises was given.  She recently started doing water aerobics and swimming.  She also walks 7 to 8 miles daily.  DDD (degenerative disc disease), cervical-I reviewed x-rays of cervical spine from November 2022 which showed severe degenerative disc disease.  She has limited range of motion without much discomfort.  DDD (degenerative disc disease), lumbar-she has known history of degenerative disease of lumbar spine.  She is currently not having discomfort.  She had no SI joint tenderness.  Osteopenia of multiple sites-I do not have DEXA results to review.  History of pericarditis - 90s  History of mitral valve prolapse  Syncope, unspecified syncope type - X2, last episode 1 year ago.  GAVE (gastric antral vascular ectasia)  Seasonal allergic rhinitis due to pollen  GAD (generalized anxiety disorder)  Orders: Orders Placed This Encounter  Procedures   XR Hand 2 View Right   XR Hand 2 View Left   CBC with Differential/Platelet   COMPLETE METABOLIC PANEL WITH GFR   Sedimentation rate   Rheumatoid factor   Cyclic citrul peptide  antibody, IgG   Uric acid   Magnesium   Iron, TIBC and Ferritin Panel   No  orders of the defined types were placed in this encounter.    Follow-Up Instructions: Return for Osteoarthritis, pseudogout.   Bo Merino, MD  Note - This record has been created using Editor, commissioning.  Chart creation errors have been sought, but may not always  have been located. Such creation errors do not reflect on  the standard of medical care.

## 2022-10-25 ENCOUNTER — Ambulatory Visit: Payer: Medicare Other | Attending: Rheumatology | Admitting: Rheumatology

## 2022-10-25 ENCOUNTER — Ambulatory Visit (INDEPENDENT_AMBULATORY_CARE_PROVIDER_SITE_OTHER): Payer: Medicare Other

## 2022-10-25 ENCOUNTER — Ambulatory Visit: Payer: Medicare Other

## 2022-10-25 ENCOUNTER — Encounter: Payer: Self-pay | Admitting: Rheumatology

## 2022-10-25 VITALS — BP 130/79 | HR 71 | Resp 14 | Ht 59.5 in | Wt 102.0 lb

## 2022-10-25 DIAGNOSIS — K31819 Angiodysplasia of stomach and duodenum without bleeding: Secondary | ICD-10-CM

## 2022-10-25 DIAGNOSIS — M51369 Other intervertebral disc degeneration, lumbar region without mention of lumbar back pain or lower extremity pain: Secondary | ICD-10-CM

## 2022-10-25 DIAGNOSIS — J301 Allergic rhinitis due to pollen: Secondary | ICD-10-CM

## 2022-10-25 DIAGNOSIS — D649 Anemia, unspecified: Secondary | ICD-10-CM | POA: Diagnosis not present

## 2022-10-25 DIAGNOSIS — M170 Bilateral primary osteoarthritis of knee: Secondary | ICD-10-CM

## 2022-10-25 DIAGNOSIS — M5136 Other intervertebral disc degeneration, lumbar region: Secondary | ICD-10-CM | POA: Diagnosis not present

## 2022-10-25 DIAGNOSIS — R55 Syncope and collapse: Secondary | ICD-10-CM

## 2022-10-25 DIAGNOSIS — R531 Weakness: Secondary | ICD-10-CM

## 2022-10-25 DIAGNOSIS — Z9889 Other specified postprocedural states: Secondary | ICD-10-CM

## 2022-10-25 DIAGNOSIS — Z79899 Other long term (current) drug therapy: Secondary | ICD-10-CM | POA: Diagnosis not present

## 2022-10-25 DIAGNOSIS — E871 Hypo-osmolality and hyponatremia: Secondary | ICD-10-CM

## 2022-10-25 DIAGNOSIS — M19042 Primary osteoarthritis, left hand: Secondary | ICD-10-CM

## 2022-10-25 DIAGNOSIS — F411 Generalized anxiety disorder: Secondary | ICD-10-CM | POA: Diagnosis not present

## 2022-10-25 DIAGNOSIS — M1990 Unspecified osteoarthritis, unspecified site: Secondary | ICD-10-CM | POA: Diagnosis not present

## 2022-10-25 DIAGNOSIS — M1120 Other chondrocalcinosis, unspecified site: Secondary | ICD-10-CM

## 2022-10-25 DIAGNOSIS — M1380 Other specified arthritis, unspecified site: Secondary | ICD-10-CM

## 2022-10-25 DIAGNOSIS — M19041 Primary osteoarthritis, right hand: Secondary | ICD-10-CM

## 2022-10-25 DIAGNOSIS — M5030 Other cervical disc degeneration, unspecified cervical region: Secondary | ICD-10-CM | POA: Diagnosis not present

## 2022-10-25 DIAGNOSIS — M1711 Unilateral primary osteoarthritis, right knee: Secondary | ICD-10-CM

## 2022-10-25 DIAGNOSIS — Z8679 Personal history of other diseases of the circulatory system: Secondary | ICD-10-CM

## 2022-10-25 DIAGNOSIS — M8589 Other specified disorders of bone density and structure, multiple sites: Secondary | ICD-10-CM

## 2022-10-25 NOTE — Patient Instructions (Signed)
Hand Exercises Hand exercises can be helpful for almost anyone. These exercises can strengthen the hands, improve flexibility and movement, and increase blood flow to the hands. These results can make work and daily tasks easier. Hand exercises can be especially helpful for people who have joint pain from arthritis or have nerve damage from overuse (carpal tunnel syndrome). These exercises can also help people who have injured a hand. Exercises Most of these hand exercises are gentle stretching and motion exercises. It is usually safe to do them often throughout the day. Warming up your hands before exercise may help to reduce stiffness. You can do this with gentle massage or by placing your hands in warm water for 10-15 minutes. It is normal to feel some stretching, pulling, tightness, or mild discomfort as you begin new exercises. This will gradually improve. Stop an exercise right away if you feel sudden, severe pain or your pain gets worse. Ask your health care provider which exercises are best for you. Knuckle bend or "claw" fist  Stand or sit with your arm, hand, and all five fingers pointed straight up. Make sure to keep your wrist straight during the exercise. Gently bend your fingers down toward your palm until the tips of your fingers are touching the top of your palm. Keep your big knuckle straight and just bend the small knuckles in your fingers. Hold this position for __________ seconds. Straighten (extend) your fingers back to the starting position. Repeat this exercise 5-10 times with each hand. Full finger fist  Stand or sit with your arm, hand, and all five fingers pointed straight up. Make sure to keep your wrist straight during the exercise. Gently bend your fingers into your palm until the tips of your fingers are touching the middle of your palm. Hold this position for __________ seconds. Extend your fingers back to the starting position, stretching every joint fully. Repeat  this exercise 5-10 times with each hand. Straight fist Stand or sit with your arm, hand, and all five fingers pointed straight up. Make sure to keep your wrist straight during the exercise. Gently bend your fingers at the big knuckle, where your fingers meet your hand, and the middle knuckle. Keep the knuckle at the tips of your fingers straight and try to touch the bottom of your palm. Hold this position for __________ seconds. Extend your fingers back to the starting position, stretching every joint fully. Repeat this exercise 5-10 times with each hand. Tabletop  Stand or sit with your arm, hand, and all five fingers pointed straight up. Make sure to keep your wrist straight during the exercise. Gently bend your fingers at the big knuckle, where your fingers meet your hand, as far down as you can while keeping the small knuckles in your fingers straight. Think of forming a tabletop with your fingers. Hold this position for __________ seconds. Extend your fingers back to the starting position, stretching every joint fully. Repeat this exercise 5-10 times with each hand. Finger spread  Place your hand flat on a table with your palm facing down. Make sure your wrist stays straight as you do this exercise. Spread your fingers and thumb apart from each other as far as you can until you feel a gentle stretch. Hold this position for __________ seconds. Bring your fingers and thumb tight together again. Hold this position for __________ seconds. Repeat this exercise 5-10 times with each hand. Making circles  Stand or sit with your arm, hand, and all five fingers pointed   straight up. Make sure to keep your wrist straight during the exercise. Make a circle by touching the tip of your thumb to the tip of your index finger. Hold for __________ seconds. Then open your hand wide. Repeat this motion with your thumb and each finger on your hand. Repeat this exercise 5-10 times with each hand. Thumb  motion  Sit with your forearm resting on a table and your wrist straight. Your thumb should be facing up toward the ceiling. Keep your fingers relaxed as you move your thumb. Lift your thumb up as high as you can toward the ceiling. Hold for __________ seconds. Bend your thumb across your palm as far as you can, reaching the tip of your thumb for the small finger (pinkie) side of your palm. Hold for __________ seconds. Repeat this exercise 5-10 times with each hand. Grip strengthening  Hold a stress ball or other soft ball in the middle of your hand. Slowly increase the pressure, squeezing the ball as much as you can without causing pain. Think of bringing the tips of your fingers into the middle of your palm. All of your finger joints should bend when doing this exercise. Hold your squeeze for __________ seconds, then relax. Repeat this exercise 5-10 times with each hand. Contact a health care provider if: Your hand pain or discomfort gets much worse when you do an exercise. Your hand pain or discomfort does not improve within 2 hours after you exercise. If you have any of these problems, stop doing these exercises right away. Do not do them again unless your health care provider says that you can. Get help right away if: You develop sudden, severe hand pain or swelling. If this happens, stop doing these exercises right away. Do not do them again unless your health care provider says that you can. This information is not intended to replace advice given to you by your health care provider. Make sure you discuss any questions you have with your health care provider. Document Revised: 11/11/2020 Document Reviewed: 11/18/2020 Elsevier Patient Education  2023 [employer]  Exercises for Chronic Knee Pain Chronic knee pain is pain that lasts longer than 3 months. For most people with chronic knee pain, exercise and weight loss is an important part of treatment. Your health care provider may want  you to focus on: Strengthening the muscles that support your knee. This can take pressure off your knee and lessen pain. Preventing knee stiffness. Maintaining or increasing how far you can move your knee. Losing weight (if this applies) to take pressure off your knee, decrease your risk for injury, and make it easier for you to exercise. Your health care provider will help you develop an exercise program that matches your needs and physical abilities. Below are simple, low-impact exercises you can do at home. Ask your health care provider or a physical therapist how often you should do your exercise program and how many times to repeat each exercise. General safety tips Follow these safety tips for exercising with chronic knee pain: Get your health care provider's approval before doing any exercises. Start slowly and stop any time an exercise causes pain. Do not exercise if your knee pain is flaring up. Warm up first. Stretching a cold muscle can cause an injury. Do 5-10 minutes of easy movement or light stretching before beginning your exercise routine. Do 5-10 minutes of low-impact activity (like walking or cycling) before starting strengthening exercises. Contact your health care provider any time you have   pain during or after exercising. Exercise may cause discomfort but should not be painful. It is normal to be a little stiff or sore after exercising.  Stretching and range-of-motion exercises Front thigh stretch  Stand up straight and support your body by holding on to a chair or resting one hand on a wall. With your legs straight and close together, bend one knee to lift your heel up toward your buttocks. Using one hand for support, grab your ankle with your free hand. Pull your foot up closer toward your buttocks to feel the stretch in front of your thigh. Hold the stretch for 30 seconds. Repeat __________ times. Complete this exercise __________ times a day. Back thigh stretch  Sit  on the floor with your back straight and your legs out straight in front of you. Place the palms of your hands on the floor and slide them toward your feet as you bend at the hip. Try to touch your nose to your knees and feel the stretch in the back of your thighs. Hold for 30 seconds. Repeat __________ times. Complete this exercise __________ times a day. Calf stretch  Stand facing a wall. Place the palms of your hands flat against the wall, arms extended, and lean slightly against the wall. Get into a lunge position with one leg bent at the knee and the other leg stretched out straight behind you. Keep both feet facing the wall and increase the bend in your knee while keeping the heel of the other leg flat on the ground. You should feel the stretch in your calf. Hold for 30 seconds. Repeat __________ times. Complete this exercise __________ times a day. Strengthening exercises Straight leg lift Lie on your back with one knee bent and the other leg out straight. Slowly lift the straight leg without bending the knee. Lift until your foot is about 12 inches (30 cm) off the floor. Hold for 3-5 seconds and slowly lower your leg. Repeat __________ times. Complete this exercise __________ times a day. Single leg dip Stand between two chairs and put both hands on the backs of the chairs for support. Extend one leg out straight with your body weight resting on the heel of the standing leg. Slowly bend your standing knee to dip your body to the level that is comfortable for you. Hold for 3-5 seconds. Repeat __________ times. Complete this exercise __________ times a day. Hamstring curls Stand straight, knees close together, facing the back of a chair. Hold on to the back of a chair with both hands. Keep one leg straight. Bend the other knee while bringing the heel up toward the buttock until the knee is bent at a 90-degree angle (right angle). Hold for 3-5 seconds. Repeat __________ times.  Complete this exercise __________ times a day. Wall squat Stand straight with your back, hips, and head against a wall. Step forward one foot at a time with your back still against the wall. Your feet should be 2 feet (61 cm) from the wall at shoulder width. Keeping your back, hips, and head against the wall, slide down the wall to as close of a sitting position as you can get. Hold for 5-10 seconds, then slowly slide back up. Repeat __________ times. Complete this exercise __________ times a day. Step-ups Step up with one foot onto a sturdy platform or stool that is about 6 inches (15 cm) high. Face sideways with one foot on the platform and one on the ground. Place all your weight   on the platform foot and lift your body off the ground until your knee extends. Let your other leg hang free to the side. Hold for 3-5 seconds then slowly lower your weight down to the floor foot. Repeat __________ times. Complete this exercise __________ times a day. Contact a health care provider if: Your exercise causes pain. Your pain is worse after you exercise. Your pain prevents you from doing your exercises. This information is not intended to replace advice given to you by your health care provider. Make sure you discuss any questions you have with your health care provider. Document Revised: 12/04/2019 Document Reviewed: 07/28/2019 Elsevier Patient Education  2023 [employer] Inc.  

## 2022-10-25 NOTE — Progress Notes (Signed)
Reviewed medication list for this patient to determine if any drug interactions are present with colchicine. No significant drug interactions discovered. Patient is appropriate to initiate colchicine therapy at this time.   Maryan Puls, PharmD PGY-1 [facility] Pharmacy Resident

## 2022-10-28 LAB — CBC WITH DIFFERENTIAL/PLATELET
Absolute Monocytes: 622 cells/uL (ref 200–950)
Basophils Absolute: 34 cells/uL (ref 0–200)
Basophils Relative: 0.4 %
Eosinophils Absolute: 84 cells/uL (ref 15–500)
Eosinophils Relative: 1 %
HCT: 41.1 % (ref 35.0–45.0)
Hemoglobin: 13.2 g/dL (ref 11.7–15.5)
Lymphs Abs: 1798 cells/uL (ref 850–3900)
MCH: 28.1 pg (ref 27.0–33.0)
MCHC: 32.1 g/dL (ref 32.0–36.0)
MCV: 87.6 fL (ref 80.0–100.0)
MPV: 10.1 fL (ref 7.5–12.5)
Monocytes Relative: 7.4 %
Neutro Abs: 5863 cells/uL (ref 1500–7800)
Neutrophils Relative %: 69.8 %
Platelets: 265 10*3/uL (ref 140–400)
RBC: 4.69 10*6/uL (ref 3.80–5.10)
RDW: 12.5 % (ref 11.0–15.0)
Total Lymphocyte: 21.4 %
WBC: 8.4 10*3/uL (ref 3.8–10.8)

## 2022-10-28 LAB — COMPLETE METABOLIC PANEL WITH GFR
AG Ratio: 1.5 (calc) (ref 1.0–2.5)
ALT: 15 U/L (ref 6–29)
AST: 16 U/L (ref 10–35)
Albumin: 4.3 g/dL (ref 3.6–5.1)
Alkaline phosphatase (APISO): 42 U/L (ref 37–153)
BUN/Creatinine Ratio: 53 (calc) — ABNORMAL HIGH (ref 6–22)
BUN: 31 mg/dL — ABNORMAL HIGH (ref 7–25)
CO2: 25 mmol/L (ref 20–32)
Calcium: 9.8 mg/dL (ref 8.6–10.4)
Chloride: 100 mmol/L (ref 98–110)
Creat: 0.59 mg/dL — ABNORMAL LOW (ref 0.60–1.00)
Globulin: 2.9 g/dL (calc) (ref 1.9–3.7)
Glucose, Bld: 87 mg/dL (ref 65–99)
Potassium: 4.9 mmol/L (ref 3.5–5.3)
Sodium: 135 mmol/L (ref 135–146)
Total Bilirubin: 0.4 mg/dL (ref 0.2–1.2)
Total Protein: 7.2 g/dL (ref 6.1–8.1)
eGFR: 94 mL/min/{1.73_m2} (ref >=60)

## 2022-10-28 LAB — IRON,TIBC AND FERRITIN PANEL
%SAT: 44 % (calc) (ref 16–45)
Ferritin: 63 ng/mL (ref 16–288)
Iron: 158 ug/dL (ref 45–160)
TIBC: 356 mcg/dL (calc) (ref 250–450)

## 2022-10-28 LAB — RHEUMATOID FACTOR: Rheumatoid fact SerPl-aCnc: <14 IU/mL (ref <14)

## 2022-10-28 LAB — SEDIMENTATION RATE: Sed Rate: 14 mm/h (ref 0–30)

## 2022-10-28 LAB — MAGNESIUM: Magnesium: 1.9 mg/dL (ref 1.5–2.5)

## 2022-10-28 LAB — URIC ACID: Uric Acid, Serum: 4.7 mg/dL (ref 2.5–7.0)

## 2022-10-28 LAB — CYCLIC CITRUL PEPTIDE ANTIBODY, IGG: Cyclic Citrullin Peptide Ab: <16 UNITS

## 2022-10-30 DIAGNOSIS — N3946 Mixed incontinence: Secondary | ICD-10-CM | POA: Diagnosis not present

## 2022-10-30 DIAGNOSIS — K582 Mixed irritable bowel syndrome: Secondary | ICD-10-CM | POA: Diagnosis not present

## 2022-10-30 DIAGNOSIS — N390 Urinary tract infection, site not specified: Secondary | ICD-10-CM | POA: Diagnosis not present

## 2022-10-30 DIAGNOSIS — N8110 Cystocele, unspecified: Secondary | ICD-10-CM | POA: Diagnosis not present

## 2022-10-30 DIAGNOSIS — B961 Klebsiella pneumoniae [K. pneumoniae] as the cause of diseases classified elsewhere: Secondary | ICD-10-CM | POA: Diagnosis not present

## 2022-11-08 NOTE — Progress Notes (Signed)
Office Visit Note  Patient: Haley Wade             Date of Birth: [birth year]           MRN: [id number]             PCP: Shon Baton, MD Referring: Shon Baton, MD Visit Date: 11/15/2022 Occupation: @GUAROCC @  Subjective:  Pain in multiple joints  History of Present Illness: Mandolyn Mctigue is a 77 y.o. 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  She returns for follow-up visit.  She states that she continues to have pain and discomfort in her bilateral hands.  She continues to have swelling over her bilateral hands which she describes in her PIP joints.  She gives history of intermittent swelling in her wrist joints.  She continues to have pain and discomfort in her knee joints.  She has been taking anti-inflammatories as needed.    Activities of Daily Living:  Patient reports morning stiffness for 7 minutes.   Patient Denies nocturnal pain.  Difficulty dressing/grooming: Reports Difficulty climbing stairs: Denies Difficulty getting out of chair: Reports Difficulty using hands for taps, buttons, cutlery, and/or writing: Reports  Review of Systems  Constitutional:  Negative for fatigue.  HENT:  Negative for mouth sores and mouth dryness.   Eyes:  Positive for dryness.  Respiratory:  Negative for shortness of breath.   Cardiovascular:  Negative for chest pain and palpitations.  Gastrointestinal:  Positive for constipation. Negative for blood in stool and diarrhea.  Endocrine: Negative for increased urination.  Genitourinary:  Negative for involuntary urination.  Musculoskeletal:  Positive for joint pain, gait problem, joint pain, joint swelling, muscle weakness and morning stiffness. Negative for myalgias, muscle tenderness and myalgias.  Skin:  Positive for hair loss. Negative for color change, rash and sensitivity to sunlight.  Allergic/Immunologic: Negative for susceptible to infections.  Neurological:  Negative for dizziness and  headaches.  Hematological:  Negative for swollen glands.  Psychiatric/Behavioral:  Negative for depressed mood and sleep disturbance. The patient is not nervous/anxious.     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   Inflammatory arthritis 11/16/2021   Complete tear of right rotator cuff    Biceps tendonitis on right    S/P rotator cuff repair 02/08/2021   Chest discomfort 01/26/2014   Palpitations 01/26/2014   Cellulitis 09/08/2013   Abdominal pain, left mid abdomen, chronic 02/26/2013   Hyponatremia 10/30/2012   COPD, questioned 01/02/2012   Allergic rhinitis due to pollen 10/20/2010   MITRAL VALVE PROLAPSE, HX OF 05/26/2009   UNSPECIFIED HEARING LOSS 05/21/2009   RHINOSINUSITIS, 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    Past Medical History:  Diagnosis Date   Allergic rhinitis, cause unspecified    Anemia    Arthritis    Cellulitis    Chest discomfort    COPD, questioned    GAVE (gastric antral vascular ectasia)    Hyponatremia    MITRAL VALVE PROLAPSE, HX OF    Other chronic sinusitis    Palpitations    Syncope    Unspecified hearing loss     Family History  Problem Relation Age of Onset   Other Mother        c difficile gastroenteritis   Lung cancer Father        smoker  Liver disease Brother    Liver cancer Brother    Asthma Other        grandmother   Other Other        bronchitis-grandmother   Past Surgical History:  Procedure Laterality Date   APPENDECTOMY     BACK SURGERY     BICEPT TENODESIS Right 02/08/2021   Procedure: BICEPS TENODESIS;  Surgeon: Meredith Pel, MD;  Location: [facility];  Service: Orthopedics;  Laterality: Right;   BLADDER SURGERY     BOWEL RESECTION N/A 11/01/2012   Procedure: SMALL BOWEL RESECTION;  Surgeon: Earnstine Regal, MD;   Location: [facility];  Service: General;  Laterality: N/A;   BREAST ENHANCEMENT SURGERY     revisions   BREAST SURGERY     CHOLECYSTECTOMY     COLON SURGERY     ESOPHAGOGASTRODUODENOSCOPY (EGD) WITH PROPOFOL N/A 10/14/2019   Procedure: ESOPHAGOGASTRODUODENOSCOPY (EGD) WITH PROPOFOL;  Surgeon: Clarene Essex, MD;  Location: [facility];  Service: Endoscopy;  Laterality: N/A;   EYE SURGERY     GI RADIOFREQUENCY ABLATION  10/14/2019   Procedure: GI RADIOFREQUENCY ABLATION;  Surgeon: Clarene Essex, MD;  Location: [facility];  Service: Endoscopy;;   LAPAROSCOPY N/A 11/01/2012   Procedure: LAPAROSCOPY DIAGNOSTIC;  Surgeon: Earnstine Regal, MD;  Location: [facility];  Service: General;  Laterality: N/A;   LAPAROTOMY N/A 11/01/2012   Procedure: EXPLORATORY LAPAROTOMY;  Surgeon: Earnstine Regal, MD;  Location: [facility];  Service: General;  Laterality: N/A;   LYSIS OF ADHESION N/A 11/01/2012   Procedure: LYSIS OF ADHESION;  Surgeon: Earnstine Regal, MD;  Location: [facility];  Service: General;  Laterality: N/A;   RHINOPLASTY     SHOULDER ARTHROSCOPY WITH OPEN ROTATOR CUFF REPAIR AND DISTAL CLAVICLE ACROMINECTOMY Right 02/08/2021   Procedure: SHOULDER ARTHROSCOPY WITH BICEPS TENDON RELEASE, MINI OPEN ROTATOR CUFF TEAR REPAIR OF THE INFRASPINATUS SUPRASPINATUS AND UPPER PORTIION OF THE SUBSCAP  WITH BICEPS TENODESIS;  Surgeon: Meredith Pel, MD;  Location: [facility];  Service: Orthopedics;  Laterality: Right;   TONSILLECTOMY     TOTAL ABDOMINAL HYSTERECTOMY     Social History   Social History Narrative   Not on file   Immunization History  Administered Date(s) Administered   COVID-19, mRNA, vaccine(Comirnaty)12 years and older 08/18/2022   Influenza Split 06/26/2011, 06/25/2012, 06/28/2013   Tdap 09/08/2013, 04/13/2021     Objective: Vital Signs: BP 116/71 (BP Location: Left Arm, Patient Position: Sitting, Cuff Size: Normal)   Pulse 98   Resp 15   Ht 4' 11.5" (1.511 m)   Wt 100 lb (45.4 kg)   BMI 19.86  kg/m    Physical Exam Vitals and nursing note reviewed.  Constitutional:      Appearance: She is well-developed.  HENT:     Head: Normocephalic and atraumatic.  Eyes:     Conjunctiva/sclera: Conjunctivae normal.  Cardiovascular:     Rate and Rhythm: Normal rate and regular rhythm.     Heart sounds: Normal heart sounds.  Pulmonary:     Effort: Pulmonary effort is normal.     Breath sounds: Normal breath sounds.  Abdominal:     General: Bowel sounds are normal.     Palpations: Abdomen is soft.  Musculoskeletal:     Cervical back: Normal range of motion.  Lymphadenopathy:     Cervical: No cervical adenopathy.  Skin:    General: Skin is warm and dry.     Capillary Refill: Capillary refill takes less than 2 seconds.  Neurological:     Mental Status: She is alert and oriented to person, place, and time.  Psychiatric:        Behavior: Behavior normal.      Musculoskeletal Exam: Cervical spine lateral rotation was limited.  Thoracolumbar scoliosis was noted.  Shoulders, elbows, wrist joints were in good range of motion with no synovitis.  There was no synovitis over MCP joints.  She had inflammation almost of her PIP joints.  Noted DIP swelling was noted.  Hip joints and knee joints in good range of motion.  No warmth swelling or effusion was noted.  There was no tenderness over ankles or MTPs.  CDAI Exam: CDAI Score: -- Patient Global: --; Provider Global: -- Swollen: --; Tender: -- Joint Exam 11/15/2022   No joint exam has been documented for this visit   There is currently no information documented on the homunculus. Go to the Rheumatology activity and complete the homunculus joint exam.  Investigation: No additional findings.  Imaging: XR Hand 2 View Right  Result Date: 10/25/2022 Severe CMC narrowing was noted.  Postsurgical changes of the [facility] joint were noted.  No intercarpal or radiocarpal joint space narrowing was noted.  Chondrocalcinosis was noted in the wrist  joint.  Mild second and third MCP narrowing was noted.  Subluxation of the third PIP joint was noted.  Severe narrowing of all PIP joints with soft tissue swelling was noted.  Mild DIP narrowing was noted.  No erosive changes were noted. Impression: These findings are suggestive of inflammatory osteoarthritis and chondrocalcinosis.  XR Hand 2 View Left  Result Date: 10/25/2022 Severe CMC narrowing was noted.  Mild radiocarpal joint space narrowing was noted.  Chondrocalcinosis was noted in the wrist joint and around the second MCP joint.  Mild second MCP narrowing was noted.  Narrowing of all PIP and DIP joints with subluxation of second and third PIP joints was noted.  Soft tissue swelling of all PIP joints was noted. Impression: These findings are suggestive of inflammatory osteoarthritis and pseudogout.   Recent Labs: Lab Results  Component Value Date   WBC 8.4 10/25/2022   HGB 13.2 10/25/2022   PLT 265 10/25/2022   NA 135 10/25/2022   K 4.9 10/25/2022   CL 100 10/25/2022   CO2 25 10/25/2022   GLUCOSE 87 10/25/2022   BUN 31 (H) 10/25/2022   CREATININE 0.59 (L) 10/25/2022   BILITOT 0.4 10/25/2022   ALKPHOS 35 (L) 11/17/2021   AST 16 10/25/2022   ALT 15 10/25/2022   PROT 7.2 10/25/2022   ALBUMIN 3.0 (L) 11/17/2021   CALCIUM 9.8 10/25/2022   GFRAA >90 09/09/2013   October 25, 2022 iron studies normal, sed rate 14, RF negative, anti-CCP negative, uric acid 4.7, magnesium 1.9  Speciality Comments: PLQ-Rash  Procedures:  No procedures performed Allergies: Plaquenil [hydroxychloroquine sulfate], Bee venom, and Terconazole   Assessment / Plan:     Visit Diagnoses: Inflammatory arthritis - History of intermittent swelling in wrist joints, hands and her knee joints.  She was given a previous diagnosis of possible rheumatoid arthritis and possible psoriatic arthritis.  Her previous rheumatologist rxd Plaquenil which caused rash.  The clinical findings are consistent with inflammatory  osteoarthritis and pseudogout overlap.  Pseudogout -she had chondrocalcinosis  in the wrist joints and knee joints x-rays.  X-ray findings were reviewed with the patient today.  Detailed counsel regarding pseudogout was provided.  The option of using colchicine was discussed.  Indications side effects were discussed at length.  Patient is willing to try colchicine 0.6 mg, half tablet p.o. daily.  If tolerated she can increase the dose to 1 tablet daily.  Will see response to colchicine.  I may consider injecting her PIP joints at the follow-up visit if she does not respond to colchicine.  High risk medication use-she was treated with Plaquenil and low-dose prednisone by her previous rheumatologist.  Plaquenil was discontinued due to rash.  Primary osteoarthritis of both hands - Right CMC surgery by Dr. Burney Gauze.  Inflammatory arthritis noted at the PIP joints.  X-rays showed severe osteoarthritis and chondrocalcinosis.  X-ray findings were reviewed with the patient.  She would be interested in getting cortisone injections to her PIP joints in the future if she does not respond to colchicine.  S/P right rotator cuff repair- - By Dr. Marlou Sa.  She had good range of motion.  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  Chondrocalcinosis was noted in bilateral knee joints.  She has had cortisone injections in the past at [facility].  She also had viscosupplement injections in the past.  DDD (degenerative disc disease), cervical -she had limited range of motion of the cervical spine with lateral rotation.  Previous x-rays showed severe degenerative disc disease .  DDD (degenerative disc disease), lumbar - Chronic discomfort.  I do not have x-rays available to review.  Osteopenia of multiple sites-DEXA results are not available.  Other medical problems listed as follows:  History of pericarditis - In 1990s with no  recurrence.  History of mitral valve prolapse  Syncope, unspecified syncope type - X 2.  Patient had no recurrence since the last episode which was a year ago.  GAVE (gastric antral vascular ectasia)  Seasonal allergic rhinitis due to pollen  GAD (generalized anxiety disorder)  Orders: No orders of the defined types were placed in this encounter.  Meds ordered this encounter  Medications   colchicine 0.6 MG tablet    Sig: Take 1 tablet (0.6 mg total) by mouth daily.    Dispense:  30 tablet    Refill:  2     Follow-Up Instructions: Return in about 2 months (around 01/15/2023) for Psudogout, OA.   Bo Merino, MD  Note - This record has been created using Editor, commissioning.  Chart creation errors have been sought, but may not always  have been located. Such creation errors do not reflect on  the standard of medical care.

## 2022-11-15 ENCOUNTER — Ambulatory Visit: Payer: Medicare Other | Attending: Rheumatology | Admitting: Rheumatology

## 2022-11-15 ENCOUNTER — Encounter: Payer: Self-pay | Admitting: Rheumatology

## 2022-11-15 VITALS — BP 116/71 | HR 98 | Resp 15 | Ht 59.5 in | Wt 100.0 lb

## 2022-11-15 DIAGNOSIS — Z79899 Other long term (current) drug therapy: Secondary | ICD-10-CM

## 2022-11-15 DIAGNOSIS — M1120 Other chondrocalcinosis, unspecified site: Secondary | ICD-10-CM | POA: Diagnosis not present

## 2022-11-15 DIAGNOSIS — M19042 Primary osteoarthritis, left hand: Secondary | ICD-10-CM | POA: Diagnosis not present

## 2022-11-15 DIAGNOSIS — M8589 Other specified disorders of bone density and structure, multiple sites: Secondary | ICD-10-CM

## 2022-11-15 DIAGNOSIS — Z9889 Other specified postprocedural states: Secondary | ICD-10-CM | POA: Diagnosis not present

## 2022-11-15 DIAGNOSIS — M170 Bilateral primary osteoarthritis of knee: Secondary | ICD-10-CM | POA: Diagnosis not present

## 2022-11-15 DIAGNOSIS — K31819 Angiodysplasia of stomach and duodenum without bleeding: Secondary | ICD-10-CM

## 2022-11-15 DIAGNOSIS — M51369 Other intervertebral disc degeneration, lumbar region without mention of lumbar back pain or lower extremity pain: Secondary | ICD-10-CM

## 2022-11-15 DIAGNOSIS — M1990 Unspecified osteoarthritis, unspecified site: Secondary | ICD-10-CM

## 2022-11-15 DIAGNOSIS — R55 Syncope and collapse: Secondary | ICD-10-CM | POA: Diagnosis not present

## 2022-11-15 DIAGNOSIS — M19041 Primary osteoarthritis, right hand: Secondary | ICD-10-CM

## 2022-11-15 DIAGNOSIS — J301 Allergic rhinitis due to pollen: Secondary | ICD-10-CM

## 2022-11-15 DIAGNOSIS — M5136 Other intervertebral disc degeneration, lumbar region: Secondary | ICD-10-CM | POA: Diagnosis not present

## 2022-11-15 DIAGNOSIS — F411 Generalized anxiety disorder: Secondary | ICD-10-CM

## 2022-11-15 DIAGNOSIS — M1380 Other specified arthritis, unspecified site: Secondary | ICD-10-CM

## 2022-11-15 DIAGNOSIS — Z8679 Personal history of other diseases of the circulatory system: Secondary | ICD-10-CM

## 2022-11-15 DIAGNOSIS — M5030 Other cervical disc degeneration, unspecified cervical region: Secondary | ICD-10-CM | POA: Diagnosis not present

## 2022-11-15 MED ORDER — COLCHICINE 0.6 MG PO TABS: 0.6000 mg | ORAL_TABLET | Freq: Every day | ORAL | 2 refills | Status: DC

## 2022-11-15 NOTE — Patient Instructions (Signed)
Hand Exercises Hand exercises can be helpful for almost anyone. These exercises can strengthen the hands, improve flexibility and movement, and increase blood flow to the hands. These results can make work and daily tasks easier. Hand exercises can be especially helpful for people who have joint pain from arthritis or have nerve damage from overuse (carpal tunnel syndrome). These exercises can also help people who have injured a hand. Exercises Most of these hand exercises are gentle stretching and motion exercises. It is usually safe to do them often throughout the day. Warming up your hands before exercise may help to reduce stiffness. You can do this with gentle massage or by placing your hands in warm water for 10-15 minutes. It is normal to feel some stretching, pulling, tightness, or mild discomfort as you begin new exercises. This will gradually improve. Stop an exercise right away if you feel sudden, severe pain or your pain gets worse. Ask your health care provider which exercises are best for you. Knuckle bend or "claw" fist  Stand or sit with your arm, hand, and all five fingers pointed straight up. Make sure to keep your wrist straight during the exercise. Gently bend your fingers down toward your palm until the tips of your fingers are touching the top of your palm. Keep your big knuckle straight and just bend the small knuckles in your fingers. Hold this position for __________ seconds. Straighten (extend) your fingers back to the starting position. Repeat this exercise 5-10 times with each hand. Full finger fist  Stand or sit with your arm, hand, and all five fingers pointed straight up. Make sure to keep your wrist straight during the exercise. Gently bend your fingers into your palm until the tips of your fingers are touching the middle of your palm. Hold this position for __________ seconds. Extend your fingers back to the starting position, stretching every joint fully. Repeat  this exercise 5-10 times with each hand. Straight fist Stand or sit with your arm, hand, and all five fingers pointed straight up. Make sure to keep your wrist straight during the exercise. Gently bend your fingers at the big knuckle, where your fingers meet your hand, and the middle knuckle. Keep the knuckle at the tips of your fingers straight and try to touch the bottom of your palm. Hold this position for __________ seconds. Extend your fingers back to the starting position, stretching every joint fully. Repeat this exercise 5-10 times with each hand. Tabletop  Stand or sit with your arm, hand, and all five fingers pointed straight up. Make sure to keep your wrist straight during the exercise. Gently bend your fingers at the big knuckle, where your fingers meet your hand, as far down as you can while keeping the small knuckles in your fingers straight. Think of forming a tabletop with your fingers. Hold this position for __________ seconds. Extend your fingers back to the starting position, stretching every joint fully. Repeat this exercise 5-10 times with each hand. Finger spread  Place your hand flat on a table with your palm facing down. Make sure your wrist stays straight as you do this exercise. Spread your fingers and thumb apart from each other as far as you can until you feel a gentle stretch. Hold this position for __________ seconds. Bring your fingers and thumb tight together again. Hold this position for __________ seconds. Repeat this exercise 5-10 times with each hand. Making circles  Stand or sit with your arm, hand, and all five fingers pointed   straight up. Make sure to keep your wrist straight during the exercise. Make a circle by touching the tip of your thumb to the tip of your index finger. Hold for __________ seconds. Then open your hand wide. Repeat this motion with your thumb and each finger on your hand. Repeat this exercise 5-10 times with each hand. Thumb  motion  Sit with your forearm resting on a table and your wrist straight. Your thumb should be facing up toward the ceiling. Keep your fingers relaxed as you move your thumb. Lift your thumb up as high as you can toward the ceiling. Hold for __________ seconds. Bend your thumb across your palm as far as you can, reaching the tip of your thumb for the small finger (pinkie) side of your palm. Hold for __________ seconds. Repeat this exercise 5-10 times with each hand. Grip strengthening  Hold a stress ball or other soft ball in the middle of your hand. Slowly increase the pressure, squeezing the ball as much as you can without causing pain. Think of bringing the tips of your fingers into the middle of your palm. All of your finger joints should bend when doing this exercise. Hold your squeeze for __________ seconds, then relax. Repeat this exercise 5-10 times with each hand. Contact a health care provider if: Your hand pain or discomfort gets much worse when you do an exercise. Your hand pain or discomfort does not improve within 2 hours after you exercise. If you have any of these problems, stop doing these exercises right away. Do not do them again unless your health care provider says that you can. Get help right away if: You develop sudden, severe hand pain or swelling. If this happens, stop doing these exercises right away. Do not do them again unless your health care provider says that you can. This information is not intended to replace advice given to you by your health care provider. Make sure you discuss any questions you have with your health care provider. Document Revised: 11/11/2020 Document Reviewed: 11/18/2020 Elsevier Patient Education  2023 Elsevier Inc.  

## 2022-11-27 ENCOUNTER — Other Ambulatory Visit (INDEPENDENT_AMBULATORY_CARE_PROVIDER_SITE_OTHER): Payer: Medicare Other

## 2022-11-27 ENCOUNTER — Encounter: Payer: Self-pay | Admitting: Orthopedic Surgery

## 2022-11-27 ENCOUNTER — Ambulatory Visit (INDEPENDENT_AMBULATORY_CARE_PROVIDER_SITE_OTHER): Payer: Medicare Other | Admitting: Orthopedic Surgery

## 2022-11-27 DIAGNOSIS — M25562 Pain in left knee: Secondary | ICD-10-CM

## 2022-11-27 DIAGNOSIS — M25561 Pain in right knee: Secondary | ICD-10-CM

## 2022-11-27 NOTE — Progress Notes (Signed)
Office Visit Note   Patient: Haley Wade           Date of Birth: 03-25-1946           MRN: [id number] Visit Date: 11/27/2022 Requested by: Creola Corn, MD [address] [place],  [place] [address] PCP: Creola Corn, MD  Subjective: Chief Complaint  Patient presents with   Right Knee - Pain   Left Knee - Pain    HPI: Haley Wade is a 77 y.o. female who presents to the office reporting bilateral knee pain left worse than right.  Patient describes relatively constant pain.  She has been doing a lot of activities including water aerobics followed by land aerobics followed by a walk.  She does this multiple days a week.  Overall she is feeling good but she has reported some lateral knee pain worse on the left than the right.  Has mild low back pain as well with some radicular symptoms.  Describes sharp pain behind the patella.  The radicular symptoms are more hamstring symptoms on that left-hand side.  Does not take any medication because of stomach issues.              ROS: All systems reviewed are negative as they relate to the chief complaint within the history of present illness.  Patient denies fevers or chills.  Assessment & Plan: Visit Diagnoses:  1. Pain in both knees, unspecified chronicity     Plan: Impression is likely overuse from activities in bilateral lower extremities.  She does have some palpable patellofemoral crepitus more noted on the left than the right.  Mild effusion present on the left but not the right.  Radiographs underwhelming for acute injury.  She is going to adjust her activities and potentially come back in 2 weeks for bilateral knee aspiration and injection.  If not both knees I think she would likely definitely get the left knee worked on.  Follow-up at that time if symptoms persist.  Follow-Up Instructions: No follow-ups on file.   Orders:  Orders Placed This Encounter  Procedures   XR KNEE 3 VIEW RIGHT   XR Knee 1-2 Views Left    No orders of the defined types were placed in this encounter.     Procedures: No procedures performed   Clinical Data: No additional findings.  Objective: Vital Signs: There were no vitals taken for this visit.  Physical Exam:  Constitutional: Patient appears well-developed HEENT:  Head: Normocephalic Eyes:EOM are normal Neck: Normal range of motion Cardiovascular: Normal rate Pulmonary/chest: Effort normal Neurologic: Patient is alert Skin: Skin is warm Psychiatric: Patient has normal mood and affect  Ortho Exam: Ortho exam demonstrates normal gait alignment.  No groin pain on the left or right-hand side with internal/external rotation of the leg.  Pedal pulses palpable.  She does have significant patellofemoral crepitus with active and passive range of motion of the left more so than the right.  Mild effusion left knee no effusion right knee.  Collateral and cruciate ligaments are stable.  Range of motion of both knees is full.  Specialty Comments:  MRI LUMBAR SPINE WITHOUT CONTRAST   TECHNIQUE: Multiplanar, multisequence MR imaging of the lumbar spine was performed. No intravenous contrast was administered.   COMPARISON:  None Available.   FINDINGS: Segmentation:  Standard.   Alignment:  2 mm anterolisthesis of L3 on L4.   Vertebrae: No acute fracture, evidence of discitis, or aggressive bone lesion.   Conus medullaris  and cauda equina: Conus extends to the L1 level. Conus and cauda equina appear normal.   Paraspinal and other soft tissues: No acute paraspinal abnormality. 2 cm right hepatic cyst again noted better seen on CT abdomen/pelvis dated 09/16/2021.   Disc levels:   Disc spaces: Degenerative disease with disc height loss at L3-4 and more severe at L4-5 and L5-S1. Disc desiccation throughout the remainder of the lumbar spine.   T12-L1: No significant disc bulge. No neural foraminal stenosis. No central canal stenosis.   L1-L2: Minimal  broad-based disc bulge. No foraminal or central canal stenosis.   L2-L3: Mild broad-based disc bulge. Mild bilateral facet arthropathy. Mild spinal stenosis. No foraminal or central stenosis.   L3-L4: Minimal broad-based disc bulge with a broad shallow left paracentral/foraminal disc protrusion. Mild bilateral facet arthropathy. Mild spinal stenosis. Bilateral lateral recess narrowing.   L4-L5: Broad-based disc bulge. No foraminal or central canal stenosis.   L5-S1: Mild broad-based disc bulge. Mild bilateral foraminal stenosis. No spinal stenosis.   IMPRESSION: 1. Lumbar spine spondylosis as described above. 2.  No acute osseous injury of the lumbar spine.     Electronically Signed   By: Elige Ko M.D.   On: 04/24/2022 09:21  Imaging: XR Knee 1-2 Views Left  Result Date: 11/27/2022 [facility] lateral [facility] radiographs left knee reviewed.  No acute fracture.  Moderate patellofemoral arthritis and mild to moderate medial compartment arthritis is present.  Alignment intact.  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  XR KNEE 3 VIEW RIGHT  Result Date: 11/27/2022 [facility] radiographs right knee reviewed.  No acute fracture.  Minimal degenerative disc disease present.  Think evidence of meniscal calcification consistent with known history of CPPD.  There is medial greater than lateral joint space narrowing which is mild.    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   Inflammatory arthritis 11/16/2021   Complete tear of right rotator cuff    Biceps tendonitis on right    S/P rotator cuff repair 02/08/2021   Chest discomfort 01/26/2014   Palpitations 01/26/2014   Cellulitis 09/08/2013   Abdominal pain, left mid abdomen,  chronic 02/26/2013   Hyponatremia 10/30/2012   COPD, questioned 01/02/2012   Allergic rhinitis due to pollen 10/20/2010   MITRAL VALVE PROLAPSE, HX OF 05/26/2009   UNSPECIFIED HEARING LOSS 05/21/2009   RHINOSINUSITIS, 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   Past Medical History:  Diagnosis Date   Allergic rhinitis, cause unspecified    Anemia    Arthritis    Cellulitis    Chest discomfort    COPD, questioned    GAVE (gastric antral vascular ectasia)    Hyponatremia    MITRAL VALVE PROLAPSE, HX OF    Other chronic sinusitis    Palpitations    Syncope    Unspecified hearing loss     Family History  Problem Relation Age of Onset   Other Mother        c difficile gastroenteritis   Lung cancer Father        smoker   Liver disease Brother    Liver cancer Brother    Asthma Other        grandmother   Other Other        bronchitis-grandmother    Past Surgical History:  Procedure  Laterality Date   APPENDECTOMY     BACK SURGERY     BICEPT TENODESIS Right 02/08/2021   Procedure: BICEPS TENODESIS;  Surgeon: Cammy Copa, MD;  Location: [facility] OR;  Service: Orthopedics;  Laterality: Right;   BLADDER SURGERY     BOWEL RESECTION N/A 11/01/2012   Procedure: SMALL BOWEL RESECTION;  Surgeon: [name], MD;  Location: [facility] ORS;  Service: General;  Laterality: N/A;   BREAST ENHANCEMENT SURGERY     revisions   BREAST SURGERY     CHOLECYSTECTOMY     COLON SURGERY     ESOPHAGOGASTRODUODENOSCOPY (EGD) WITH PROPOFOL N/A 10/14/2019   Procedure: ESOPHAGOGASTRODUODENOSCOPY (EGD) WITH PROPOFOL;  Surgeon: Vida Rigger, MD;  Location: [facility] ENDOSCOPY;  Service: Endoscopy;  Laterality: N/A;   EYE SURGERY     GI RADIOFREQUENCY ABLATION  10/14/2019   Procedure: GI RADIOFREQUENCY ABLATION;  Surgeon: Vida Rigger, MD;  Location: [facility]OS[facility];  Service: Endoscopy;;   LAPAROSCOPY N/A 11/01/2012   Procedure: LAPAROSCOPY DIAGNOSTIC;  Surgeon: [name], MD;  Location: [facility] ORS;  Service: General;  Laterality: N/A;    LAPAROTOMY N/A 11/01/2012   Procedure: EXPLORATORY LAPAROTOMY;  Surgeon: [name], MD;  Location: [facility] ORS;  Service: General;  Laterality: N/A;   LYSIS OF ADHESION N/A 11/01/2012   Procedure: LYSIS OF ADHESION;  Surgeon: [name], MD;  Location: [facility] ORS;  Service: General;  Laterality: N/A;   RHINOPLASTY     SHOULDER ARTHROSCOPY WITH OPEN ROTATOR CUFF REPAIR AND DISTAL CLAVICLE ACROMINECTOMY Right 02/08/2021   Procedure: SHOULDER ARTHROSCOPY WITH BICEPS TENDON RELEASE, MINI OPEN ROTATOR CUFF TEAR REPAIR OF THE INFRASPINATUS SUPRASPINATUS AND UPPER PORTIION OF THE SUBSCAP  WITH BICEPS TENODESIS;  Surgeon: Cammy Copa, MD;  Location: [facility] OR;  Service: Orthopedics;  Laterality: Right;   TONSILLECTOMY     TOTAL ABDOMINAL HYSTERECTOMY     Social History   Occupational History   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

## 2022-12-01 DIAGNOSIS — K219 Gastro-esophageal reflux disease without esophagitis: Secondary | ICD-10-CM | POA: Diagnosis not present

## 2022-12-01 DIAGNOSIS — I340 Nonrheumatic mitral (valve) insufficiency: Secondary | ICD-10-CM | POA: Diagnosis not present

## 2022-12-01 DIAGNOSIS — D649 Anemia, unspecified: Secondary | ICD-10-CM | POA: Diagnosis not present

## 2022-12-01 DIAGNOSIS — R7989 Other specified abnormal findings of blood chemistry: Secondary | ICD-10-CM | POA: Diagnosis not present

## 2022-12-01 DIAGNOSIS — Z1212 Encounter for screening for malignant neoplasm of rectum: Secondary | ICD-10-CM | POA: Diagnosis not present

## 2022-12-01 DIAGNOSIS — R002 Palpitations: Secondary | ICD-10-CM | POA: Diagnosis not present

## 2022-12-01 DIAGNOSIS — R5383 Other fatigue: Secondary | ICD-10-CM | POA: Diagnosis not present

## 2022-12-01 DIAGNOSIS — E559 Vitamin D deficiency, unspecified: Secondary | ICD-10-CM | POA: Diagnosis not present

## 2022-12-08 DIAGNOSIS — K5900 Constipation, unspecified: Secondary | ICD-10-CM | POA: Diagnosis not present

## 2022-12-08 DIAGNOSIS — I340 Nonrheumatic mitral (valve) insufficiency: Secondary | ICD-10-CM | POA: Diagnosis not present

## 2022-12-08 DIAGNOSIS — K219 Gastro-esophageal reflux disease without esophagitis: Secondary | ICD-10-CM | POA: Diagnosis not present

## 2022-12-08 DIAGNOSIS — M79643 Pain in unspecified hand: Secondary | ICD-10-CM | POA: Diagnosis not present

## 2022-12-08 DIAGNOSIS — D649 Anemia, unspecified: Secondary | ICD-10-CM | POA: Diagnosis not present

## 2022-12-08 DIAGNOSIS — I4510 Unspecified right bundle-branch block: Secondary | ICD-10-CM | POA: Diagnosis not present

## 2022-12-08 DIAGNOSIS — H8109 Meniere's disease, unspecified ear: Secondary | ICD-10-CM | POA: Diagnosis not present

## 2022-12-08 DIAGNOSIS — M154 Erosive (osteo)arthritis: Secondary | ICD-10-CM | POA: Diagnosis not present

## 2022-12-08 DIAGNOSIS — I700 Atherosclerosis of aorta: Secondary | ICD-10-CM | POA: Diagnosis not present

## 2022-12-08 DIAGNOSIS — E559 Vitamin D deficiency, unspecified: Secondary | ICD-10-CM | POA: Diagnosis not present

## 2022-12-08 DIAGNOSIS — Z1212 Encounter for screening for malignant neoplasm of rectum: Secondary | ICD-10-CM | POA: Diagnosis not present

## 2022-12-08 DIAGNOSIS — Z1331 Encounter for screening for depression: Secondary | ICD-10-CM | POA: Diagnosis not present

## 2022-12-08 DIAGNOSIS — R002 Palpitations: Secondary | ICD-10-CM | POA: Diagnosis not present

## 2022-12-08 DIAGNOSIS — Z1339 Encounter for screening examination for other mental health and behavioral disorders: Secondary | ICD-10-CM | POA: Diagnosis not present

## 2022-12-08 DIAGNOSIS — K31819 Angiodysplasia of stomach and duodenum without bleeding: Secondary | ICD-10-CM | POA: Diagnosis not present

## 2022-12-08 DIAGNOSIS — M8580 Other specified disorders of bone density and structure, unspecified site: Secondary | ICD-10-CM | POA: Diagnosis not present

## 2022-12-08 DIAGNOSIS — M5380 Other specified dorsopathies, site unspecified: Secondary | ICD-10-CM | POA: Diagnosis not present

## 2022-12-08 DIAGNOSIS — M1990 Unspecified osteoarthritis, unspecified site: Secondary | ICD-10-CM | POA: Diagnosis not present

## 2022-12-08 DIAGNOSIS — Z0000 Encounter for general adult medical examination without abnormal findings: Secondary | ICD-10-CM | POA: Diagnosis not present

## 2022-12-11 DIAGNOSIS — L650 Telogen effluvium: Secondary | ICD-10-CM | POA: Diagnosis not present

## 2022-12-11 DIAGNOSIS — R208 Other disturbances of skin sensation: Secondary | ICD-10-CM | POA: Diagnosis not present

## 2022-12-11 DIAGNOSIS — L658 Other specified nonscarring hair loss: Secondary | ICD-10-CM | POA: Diagnosis not present

## 2022-12-11 DIAGNOSIS — L299 Pruritus, unspecified: Secondary | ICD-10-CM | POA: Diagnosis not present

## 2022-12-13 DIAGNOSIS — L218 Other seborrheic dermatitis: Secondary | ICD-10-CM | POA: Diagnosis not present

## 2022-12-18 ENCOUNTER — Ambulatory Visit (INDEPENDENT_AMBULATORY_CARE_PROVIDER_SITE_OTHER): Payer: Medicare Other | Admitting: Orthopedic Surgery

## 2022-12-18 DIAGNOSIS — M1711 Unilateral primary osteoarthritis, right knee: Secondary | ICD-10-CM

## 2022-12-18 DIAGNOSIS — M1712 Unilateral primary osteoarthritis, left knee: Secondary | ICD-10-CM | POA: Diagnosis not present

## 2022-12-19 ENCOUNTER — Encounter: Payer: Self-pay | Admitting: Orthopedic Surgery

## 2022-12-19 MED ORDER — METHYLPREDNISOLONE ACETATE 40 MG/ML IJ SUSP
40.0000 mg | INTRAMUSCULAR | Status: AC | PRN
Start: 2022-12-18 — End: 2022-12-18
  Administered 2022-12-18: 40 mg via INTRA_ARTICULAR

## 2022-12-19 MED ORDER — LIDOCAINE HCL 1 % IJ SOLN
5.0000 mL | INTRAMUSCULAR | Status: AC | PRN
Start: 2022-12-18 — End: 2022-12-18
  Administered 2022-12-18: 5 mL

## 2022-12-19 MED ORDER — BUPIVACAINE HCL 0.25 % IJ SOLN
4.0000 mL | INTRAMUSCULAR | Status: AC | PRN
Start: 2022-12-18 — End: 2022-12-18
  Administered 2022-12-18: 4 mL via INTRA_ARTICULAR

## 2022-12-19 NOTE — Progress Notes (Signed)
Office Visit Note   Patient: Haley Wade           Date of Birth: Oct 17, 1945           MRN: [id number] Visit Date: 12/18/2022 Requested by: Creola Corn, MD [address] [place],  [place] [address] PCP: Creola Corn, MD  Subjective: Chief Complaint  Patient presents with   Left Knee - Pain   Right Knee - Pain    HPI: Haley Wade is a 77 y.o. female who presents to the office reporting bilateral knee pain left worse than right.  She has decreased some of her activities including walking and water aerobics classes.  Decision point today was for or against knee aspiration and injection.  She would like to try 1 injection into the left knee today which is her more symptomatic side..                ROS: All systems reviewed are negative as they relate to the chief complaint within the history of present illness.  Patient denies fevers or chills.  Assessment & Plan: Visit Diagnoses: No diagnosis found.  Plan: Impression is left knee symptomatic synovitis/arthritis.  Knee injections performed today.  I think we could repeat that right knee injection in a week depending on her symptoms.  She tolerated the left knee injection well.  Follow-up in 7 days for repeat injection into the right knee.  Follow-Up Instructions: No follow-ups on file.   Orders:  No orders of the defined types were placed in this encounter.  No orders of the defined types were placed in this encounter.     Procedures: Large Joint Inj: L knee on 12/18/2022 12:24 PM Indications: diagnostic evaluation, joint swelling and pain Details: 18 G 1.5 in needle, superolateral approach  Arthrogram: No  Medications: 5 mL lidocaine 1 %; 40 mg methylPREDNISolone acetate 40 MG/ML; 4 mL bupivacaine 0.25 % Outcome: tolerated well, no immediate complications Procedure, treatment alternatives, risks and benefits explained, specific risks discussed. Consent was given by the patient. Immediately prior to  procedure a time out was called to verify the correct patient, procedure, equipment, support staff and site/side marked as required. Patient was prepped and draped in the usual sterile fashion.       Clinical Data: No additional findings.  Objective: Vital Signs: There were no vitals taken for this visit.  Physical Exam:  Constitutional: Patient appears well-developed HEENT:  Head: Normocephalic Eyes:EOM are normal Neck: Normal range of motion Cardiovascular: Normal rate Pulmonary/chest: Effort normal Neurologic: Patient is alert Skin: Skin is warm Psychiatric: Patient has normal mood and affect  Ortho Exam: Ortho exam demonstrates normal gait and alignment.  No effusion in either knee today.  Range of motion is full.  Collateral crucial ligaments are stable.  Mild joint line tenderness on the left and right hand side of that left knee.  No groin pain on the left.  Range of motion is full.  Specialty Comments:  MRI LUMBAR SPINE WITHOUT CONTRAST   TECHNIQUE: Multiplanar, multisequence MR imaging of the lumbar spine was performed. No intravenous contrast was administered.   COMPARISON:  None Available.   FINDINGS: Segmentation:  Standard.   Alignment:  2 mm anterolisthesis of L3 on L4.   Vertebrae: No acute fracture, evidence of discitis, or aggressive bone lesion.   Conus medullaris and cauda equina: Conus extends to the L1 level. Conus and cauda equina appear normal.   Paraspinal and other soft tissues: No acute paraspinal  abnormality. 2 cm right hepatic cyst again noted better seen on CT abdomen/pelvis dated 09/16/2021.   Disc levels:   Disc spaces: Degenerative disease with disc height loss at L3-4 and more severe at L4-5 and L5-S1. Disc desiccation throughout the remainder of the lumbar spine.   T12-L1: No significant disc bulge. No neural foraminal stenosis. No central canal stenosis.   L1-L2: Minimal broad-based disc bulge. No foraminal or central  canal stenosis.   L2-L3: Mild broad-based disc bulge. Mild bilateral facet arthropathy. Mild spinal stenosis. No foraminal or central stenosis.   L3-L4: Minimal broad-based disc bulge with a broad shallow left paracentral/foraminal disc protrusion. Mild bilateral facet arthropathy. Mild spinal stenosis. Bilateral lateral recess narrowing.   L4-L5: Broad-based disc bulge. No foraminal or central canal stenosis.   L5-S1: Mild broad-based disc bulge. Mild bilateral foraminal stenosis. No spinal stenosis.   IMPRESSION: 1. Lumbar spine spondylosis as described above. 2.  No acute osseous injury of the lumbar spine.     Electronically Signed   By: Elige Ko M.D.   On: 04/24/2022 09:21  Imaging: No results found.   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   Inflammatory arthritis 11/16/2021   Complete tear of right rotator cuff    Biceps tendonitis on right    S/P rotator cuff repair 02/08/2021   Chest discomfort 01/26/2014   Palpitations 01/26/2014   Cellulitis 09/08/2013   Abdominal pain, left mid abdomen, chronic 02/26/2013   Hyponatremia 10/30/2012   COPD, questioned 01/02/2012   Allergic rhinitis due to pollen 10/20/2010   MITRAL VALVE PROLAPSE, HX OF 05/26/2009   UNSPECIFIED HEARING LOSS 05/21/2009   RHINOSINUSITIS, 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   Past Medical History:  Diagnosis Date   Allergic rhinitis, cause unspecified    Anemia    Arthritis    Cellulitis    Chest discomfort    COPD, questioned    GAVE (gastric antral vascular ectasia)    Hyponatremia    MITRAL VALVE PROLAPSE, HX OF    Other chronic sinusitis    Palpitations    Syncope    Unspecified hearing loss     Family History  Problem Relation Age of Onset   Other Mother        c  difficile gastroenteritis   Lung cancer Father        smoker   Liver disease Brother    Liver cancer Brother    Asthma Other        grandmother   Other Other        bronchitis-grandmother    Past Surgical History:  Procedure Laterality Date   APPENDECTOMY     BACK SURGERY     BICEPT TENODESIS Right 02/08/2021   Procedure: BICEPS TENODESIS;  Surgeon: Cammy Copa, MD;  Location: [facility] OR;  Service: Orthopedics;  Laterality: Right;   BLADDER SURGERY     BOWEL RESECTION N/A 11/01/2012   Procedure: SMALL BOWEL RESECTION;  Surgeon: [name], MD;  Location: [facility] ORS;  Service: General;  Laterality: N/A;   BREAST ENHANCEMENT SURGERY     revisions   BREAST SURGERY     CHOLECYSTECTOMY     COLON SURGERY     ESOPHAGOGASTRODUODENOSCOPY (EGD) WITH PROPOFOL N/A 10/14/2019   Procedure: ESOPHAGOGASTRODUODENOSCOPY (EGD) WITH PROPOFOL;  Surgeon: Vida Rigger, MD;  Location: [facility] END[facility]COPY;  Service: Endoscopy;  Laterality: N/A;   EYE SURGERY     GI RADIOFREQUENCY ABLATION  10/14/2019   Procedure: GI RADIOFREQUENCY ABLATION;  Surgeon: Vida Rigger, MD;  Location: [facility]COPY;  Service: Endoscopy;;   LAPAROSCOPY N/A 11/01/2012   Procedure: LAPAROSCOPY DIAGNOSTIC;  Surgeon: [name], MD;  Location: [facility] ORS;  Service: General;  Laterality: N/A;   LAPAROTOMY N/A 11/01/2012   Procedure: EXPLORATORY LAPAROTOMY;  Surgeon: [name], MD;  Location: [facility] ORS;  Service: General;  Laterality: N/A;   LYSIS OF ADHESION N/A 11/01/2012   Procedure: LYSIS OF ADHESION;  Surgeon: [name], MD;  Location: [facility] ORS;  Service: General;  Laterality: N/A;   RHINOPLASTY     SHOULDER ARTHROSCOPY WITH OPEN ROTATOR CUFF REPAIR AND DISTAL CLAVICLE ACROMINECTOMY Right 02/08/2021   Procedure: SHOULDER ARTHROSCOPY WITH BICEPS TENDON RELEASE, MINI OPEN ROTATOR CUFF TEAR REPAIR OF THE INFRASPINATUS SUPRASPINATUS AND UPPER PORTIION OF THE SUBSCAP  WITH BICEPS TENODESIS;  Surgeon: Cammy Copa, MD;  Location: [facility]  OR;  Service: Orthopedics;  Laterality: Right;   TONSILLECTOMY     TOTAL ABDOMINAL HYSTERECTOMY     Social History   Occupational History   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

## 2022-12-27 ENCOUNTER — Ambulatory Visit (INDEPENDENT_AMBULATORY_CARE_PROVIDER_SITE_OTHER): Payer: Medicare Other | Admitting: Orthopedic Surgery

## 2022-12-27 ENCOUNTER — Encounter: Payer: Self-pay | Admitting: Orthopedic Surgery

## 2022-12-27 DIAGNOSIS — M1711 Unilateral primary osteoarthritis, right knee: Secondary | ICD-10-CM | POA: Diagnosis not present

## 2022-12-27 MED ORDER — BUPIVACAINE HCL 0.25 % IJ SOLN
4.0000 mL | INTRAMUSCULAR | Status: AC | PRN
Start: 2022-12-27 — End: 2022-12-27
  Administered 2022-12-27: 4 mL via INTRA_ARTICULAR

## 2022-12-27 MED ORDER — LIDOCAINE HCL 1 % IJ SOLN
5.0000 mL | INTRAMUSCULAR | Status: AC | PRN
  Administered 2022-12-27: 5 mL

## 2022-12-27 MED ORDER — METHYLPREDNISOLONE ACETATE 40 MG/ML IJ SUSP
40.0000 mg | INTRAMUSCULAR | Status: AC | PRN
Start: 2022-12-27 — End: 2022-12-27
  Administered 2022-12-27: 40 mg via INTRA_ARTICULAR

## 2022-12-27 NOTE — Progress Notes (Signed)
   Procedure Note  Patient: Haley Wade             Date of Birth: 1945-09-21           MRN: [id number]             Visit Date: 12/27/2022  Procedures: Visit Diagnoses:  1. Unilateral primary osteoarthritis, right knee     Large Joint Inj: R knee on 12/27/2022 4:42 PM Indications: diagnostic evaluation, joint swelling and pain Details: 18 G 1.5 in needle, superolateral approach  Arthrogram: No  Medications: 5 mL lidocaine 1 %; 40 mg methylPREDNISolone acetate 40 MG/ML; 4 mL bupivacaine 0.25 % Outcome: tolerated well, no immediate complications Procedure, treatment alternatives, risks and benefits explained, specific risks discussed. Consent was given by the patient. Immediately prior to procedure a time out was called to verify the correct patient, procedure, equipment, support staff and site/side marked as required. Patient was prepped and draped in the usual sterile fashion.

## 2022-12-28 NOTE — Progress Notes (Signed)
Office Visit Note  Patient: Haley Wade             Date of Birth: 1946/05/04           MRN: [id number]             PCP: Creola Corn, MD Referring: Creola Corn, MD Visit Date: 01/11/2023 Occupation: @GUAROCC @  Subjective:  Pain in both hands  History of Present Illness: Haley Wade is a 77 y.o. 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  She continues to have pain and discomfort in her both hands and wrist joints.  She gives history of intermittent swelling in her wrists.  She would like cortisone injection in her left hand index finger PIP joint.  She would like to come back for right hand PIP joints in the future.  She states she has been doing hand exercises on a regular basis.  She is also doing water aerobics and swimming which has been helpful.  She has intermittent discomfort in her knee joints.    Activities of Daily Living:  Patient reports morning stiffness for 4-5 minutes.   Patient Denies nocturnal pain.  Difficulty dressing/grooming: Denies Difficulty climbing stairs: Denies Difficulty getting out of chair: Denies Difficulty using hands for taps, buttons, cutlery, and/or writing: Reports  Review of Systems  Constitutional:  Negative for fatigue.  HENT:  Positive for mouth dryness. Negative for mouth sores.   Eyes:  Negative for dryness.  Respiratory:  Negative for shortness of breath.   Cardiovascular:  Negative for chest pain and palpitations.  Gastrointestinal:  Negative for blood in stool, constipation and diarrhea.  Endocrine: Negative for increased urination.  Genitourinary:  Negative for involuntary urination.  Musculoskeletal:  Positive for joint pain, joint pain, joint swelling and morning stiffness. Negative for gait problem, myalgias, muscle weakness, muscle tenderness and myalgias.  Skin:  Positive for hair loss. Negative for color change, rash and sensitivity to sunlight.  Allergic/Immunologic: Negative for  susceptible to infections.  Neurological:  Negative for dizziness and headaches.  Hematological:  Negative for swollen glands.  Psychiatric/Behavioral:  Negative for depressed mood and sleep disturbance. The patient is not nervous/anxious.     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   Inflammatory arthritis 11/16/2021   Complete tear of right rotator cuff    Biceps tendonitis on right    S/P rotator cuff repair 02/08/2021   Chest discomfort 01/26/2014   Palpitations 01/26/2014   Cellulitis 09/08/2013   Abdominal pain, left mid abdomen, chronic 02/26/2013   Hyponatremia 10/30/2012   COPD, questioned 01/02/2012   Allergic rhinitis due to pollen 10/20/2010   MITRAL VALVE PROLAPSE, HX OF 05/26/2009   UNSPECIFIED HEARING LOSS 05/21/2009   RHINOSINUSITIS, 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    Past Medical History:  Diagnosis Date   Allergic rhinitis, cause unspecified    Anemia    Arthritis    Cellulitis    Chest discomfort    COPD, questioned    GAVE (gastric antral vascular ectasia)    Hyponatremia    MITRAL VALVE PROLAPSE, HX OF    Other chronic sinusitis    Palpitations    Syncope    Unspecified hearing loss     Family History  Problem Relation Age of Onset   Other Mother  c difficile gastroenteritis   Lung cancer Father        smoker   Liver disease Brother    Liver cancer Brother    Asthma Other        grandmother   Other Other        bronchitis-grandmother   Past Surgical History:  Procedure Laterality Date   APPENDECTOMY     BACK SURGERY     BICEPT TENODESIS Right 02/08/2021   Procedure: BICEPS TENODESIS;  Surgeon: Cammy Copa, MD;  Location: [facility];  Service: Orthopedics;  Laterality: Right;   BLADDER SURGERY     BOWEL RESECTION N/A  11/01/2012   Procedure: SMALL BOWEL RESECTION;  Surgeon: [name], MD;  Location: [facility];  Service: General;  Laterality: N/A;   BREAST ENHANCEMENT SURGERY     revisions   BREAST SURGERY     CHOLECYSTECTOMY     COLON SURGERY     ESOPHAGOGASTRODUODENOSCOPY (EGD) WITH PROPOFOL N/A 10/14/2019   Procedure: ESOPHAGOGASTRODUODENOSCOPY (EGD) WITH PROPOFOL;  Surgeon: Vida Rigger, MD;  Location: [facility];  Service: Endoscopy;  Laterality: N/A;   EYE SURGERY     GI RADIOFREQUENCY ABLATION  10/14/2019   Procedure: GI RADIOFREQUENCY ABLATION;  Surgeon: Vida Rigger, MD;  Location: [facility];  Service: Endoscopy;;   LAPAROSCOPY N/A 11/01/2012   Procedure: LAPAROSCOPY DIAGNOSTIC;  Surgeon: [name], MD;  Location: [facility];  Service: General;  Laterality: N/A;   LAPAROTOMY N/A 11/01/2012   Procedure: EXPLORATORY LAPAROTOMY;  Surgeon: [name], MD;  Location: [facility];  Service: General;  Laterality: N/A;   LYSIS OF ADHESION N/A 11/01/2012   Procedure: LYSIS OF ADHESION;  Surgeon: [name], MD;  Location: [facility];  Service: General;  Laterality: N/A;   RHINOPLASTY     SHOULDER ARTHROSCOPY WITH OPEN ROTATOR CUFF REPAIR AND DISTAL CLAVICLE ACROMINECTOMY Right 02/08/2021   Procedure: SHOULDER ARTHROSCOPY WITH BICEPS TENDON RELEASE, MINI OPEN ROTATOR CUFF TEAR REPAIR OF THE INFRASPINATUS SUPRASPINATUS AND UPPER PORTIION OF THE SUBSCAP  WITH BICEPS TENODESIS;  Surgeon: Cammy Copa, MD;  Location: [facility];  Service: Orthopedics;  Laterality: Right;   TONSILLECTOMY     TOTAL ABDOMINAL HYSTERECTOMY     Social History   Social History Narrative   Not on file   Immunization History  Administered Date(s) Administered   COVID-19, mRNA, vaccine(Comirnaty)12 years and older 08/18/2022   Influenza Split 06/26/2011, 06/25/2012, 06/28/2013   Tdap 09/08/2013, 04/13/2021     Objective: Vital Signs: BP 135/78 (BP Location: Left Arm, Patient Position: Sitting, Cuff Size: Normal)   Pulse  80   Resp 13   Ht 4' 11.5" (1.511 m)   Wt 99 lb 3.2 oz (45 kg)   BMI 19.70 kg/m    Physical Exam Vitals and nursing note reviewed.  Constitutional:      Appearance: She is well-developed.  HENT:     Head: Normocephalic and atraumatic.  Eyes:     Conjunctiva/sclera: Conjunctivae normal.  Cardiovascular:     Rate and Rhythm: Normal rate and regular rhythm.     Heart sounds: Normal heart sounds.  Pulmonary:     Effort: Pulmonary effort is normal.     Breath sounds: Normal breath sounds.  Abdominal:     General: Bowel sounds are normal.     Palpations: Abdomen is soft.  Musculoskeletal:     Cervical back: Normal range of motion.  Lymphadenopathy:     Cervical: No cervical adenopathy.  Skin:  General: Skin is warm and dry.     Capillary Refill: Capillary refill takes less than 2 seconds.  Neurological:     Mental Status: She is alert and oriented to person, place, and time.  Psychiatric:        Behavior: Behavior normal.      Musculoskeletal Exam: She had limited lateral rotation of the cervical spine.  Thoracolumbar scoliosis was noted.  Shoulder joints, elbow joints, wrist joints were in good range of motion with no synovitis.  She had swelling over PIP joints.  Hip joints and knee joints were in good range of motion.  No warmth swelling or effusion was noted.  There was no tenderness over ankles or MTPs.  CDAI Exam: CDAI Score: -- Patient Global: --; Provider Global: -- Swollen: --; Tender: -- Joint Exam 01/11/2023   No joint exam has been documented for this visit   There is currently no information documented on the homunculus. Go to the Rheumatology activity and complete the homunculus joint exam.  Investigation: No additional findings.  Imaging: US Guided Needle Placement  Result Date: 01/11/2023 Ultrasound guided injection is preferred based studies that show increased duration, increased effect, greater accuracy, decreased procedural pain, increased  response rate, and decreased cost with ultrasound guided versus blind injection.   Verbal informed consent obtained.  Time-out conducted.  Noted no overlying erythema, induration, or other signs of local infection. Ultrasound-guided PIP joint injection: After sterile prep with Betadine, injected 0.5 mL of 1% lidocaine and 10 mg Kenalog using a 27-gauge needle, and the PIP joint.     Recent Labs: Lab Results  Component Value Date   WBC 8.4 10/25/2022   HGB 13.2 10/25/2022   PLT 265 10/25/2022   NA 135 10/25/2022   K 4.9 10/25/2022   CL 100 10/25/2022   CO2 25 10/25/2022   GLUCOSE 87 10/25/2022   BUN 31 (H) 10/25/2022   CREATININE 0.59 (L) 10/25/2022   BILITOT 0.4 10/25/2022   ALKPHOS 35 (L) 11/17/2021   AST 16 10/25/2022   ALT 15 10/25/2022   PROT 7.2 10/25/2022   ALBUMIN 3.0 (L) 11/17/2021   CALCIUM 9.8 10/25/2022   GFRAA >90 09/09/2013    Speciality Comments: PLQ-Rash  Procedures:  Small Joint Inj: L index PIP on 01/11/2023 12:24 PM Indications: pain and joint swelling Details: 27 G needle, ultrasound-guided dorsal approach  Spinal Needle: No  Medications: 0.5 mL lidocaine 1 %; 10 mg triamcinolone acetonide 40 MG/ML Aspirate: 0 mL Outcome: tolerated well, no immediate complications Procedure, treatment alternatives, risks and benefits explained, specific risks discussed. Consent was given by the patient. Immediately prior to procedure a time out was called to verify the correct patient, procedure, equipment, support staff and site/side marked as required. Patient was prepped and draped in the usual sterile fashion.     Allergies: Plaquenil [hydroxychloroquine sulfate], Bee venom, Other, and Terconazole   Assessment / Plan:     Visit Diagnoses: Inflammatory arthritis - History of intermittent swelling in wrist joints, hands and knee joints.  previous diagnosis of possible rheumatoid arthritis and possible psoriatic arthritis.  She appears to have inflammatory osteoarthritis  with swelling of the PIP joints.  She has noticed improvement since she has been taking colchicine and doing stretching exercises.  She takes colchicine only on as needed basis.  Swelling was noted over most of her PIP joints.  There is swelling was noted over DIP joints.  No MCP or wrist joint involvement was noted.  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 colchicine 0.6 mg, half tablet p.o. daily.  Patient has been taking colchicine only on.  Basis.  High risk medication use - she was treated with Plaquenil and low-dose prednisone by her previous rheumatologist.  Plaquenil was discontinued due to rash.  Primary osteoarthritis of both hands - Right [facility] surgery by Dr. Mina Marble. -She requested left second PIP joint injection today.  After side effects were discussed the left second PIP joint was prepped in sterile fashion injected with lidocaine and Kenalog as described above.  Patient tolerated the procedure well.  Postprocedure instructions were given.  A splint was applied.  She wants to come in later for her right fifth PIP joint injection.  She will schedule an appointment.  Plan: US Guided Needle Placement,  S/P right rotator cuff repair- - By Dr. August Saucer.  Primary osteoarthritis of both knees -she had good range of motion of bilateral knee joints.  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  Chondrocalcinosis was noted in bilateral knee joints.  DDD (degenerative disc disease), cervical -she continues to have discomfort in her cervical spine.  She had limited range of motion.  Previous x-rays showed severe degenerative disc disease.  DDD (degenerative disc disease), lumbar-she has intermittent lower back pain.  Osteopenia of multiple sites - DEXA results are not available.  History of pericarditis - In 1990s with no recurrence.  Syncope, unspecified syncope type - X 2.  Patient had no recurrence since the last  episode which was a year ago.  History of mitral valve prolapse  GAVE (gastric antral vascular ectasia)  GAD (generalized anxiety disorder)  Seasonal allergic rhinitis due to pollen  Orders: Orders Placed This Encounter  Procedures   Small Joint Inj   US Guided Needle Placement   No orders of the defined types were placed in this encounter.    Follow-Up Instructions: Return in about 6 months (around 07/14/2023) for Inflammatory osteoarthritis.   Pollyann Savoy, MD  Note - This record has been created using Animal nutritionist.  Chart creation errors have been sought, but may not always  have been located. Such creation errors do not reflect on  the standard of medical care.

## 2023-01-01 DIAGNOSIS — L259 Unspecified contact dermatitis, unspecified cause: Secondary | ICD-10-CM | POA: Diagnosis not present

## 2023-01-03 DIAGNOSIS — N3946 Mixed incontinence: Secondary | ICD-10-CM | POA: Diagnosis not present

## 2023-01-03 DIAGNOSIS — N390 Urinary tract infection, site not specified: Secondary | ICD-10-CM | POA: Diagnosis not present

## 2023-01-04 DIAGNOSIS — L2389 Allergic contact dermatitis due to other agents: Secondary | ICD-10-CM | POA: Diagnosis not present

## 2023-01-04 DIAGNOSIS — L232 Allergic contact dermatitis due to cosmetics: Secondary | ICD-10-CM | POA: Diagnosis not present

## 2023-01-11 ENCOUNTER — Encounter: Payer: Self-pay | Admitting: Rheumatology

## 2023-01-11 ENCOUNTER — Ambulatory Visit (INDEPENDENT_AMBULATORY_CARE_PROVIDER_SITE_OTHER): Payer: Medicare Other

## 2023-01-11 ENCOUNTER — Ambulatory Visit: Payer: Medicare Other | Attending: Rheumatology | Admitting: Rheumatology

## 2023-01-11 ENCOUNTER — Ambulatory Visit: Payer: Medicare Other

## 2023-01-11 VITALS — BP 135/78 | HR 80 | Resp 13 | Ht 59.5 in | Wt 99.2 lb

## 2023-01-11 DIAGNOSIS — K31819 Angiodysplasia of stomach and duodenum without bleeding: Secondary | ICD-10-CM

## 2023-01-11 DIAGNOSIS — F411 Generalized anxiety disorder: Secondary | ICD-10-CM | POA: Diagnosis not present

## 2023-01-11 DIAGNOSIS — M170 Bilateral primary osteoarthritis of knee: Secondary | ICD-10-CM

## 2023-01-11 DIAGNOSIS — Z9889 Other specified postprocedural states: Secondary | ICD-10-CM | POA: Diagnosis not present

## 2023-01-11 DIAGNOSIS — M1990 Unspecified osteoarthritis, unspecified site: Secondary | ICD-10-CM | POA: Insufficient documentation

## 2023-01-11 DIAGNOSIS — Z8679 Personal history of other diseases of the circulatory system: Secondary | ICD-10-CM | POA: Diagnosis not present

## 2023-01-11 DIAGNOSIS — M1120 Other chondrocalcinosis, unspecified site: Secondary | ICD-10-CM

## 2023-01-11 DIAGNOSIS — Z79899 Other long term (current) drug therapy: Secondary | ICD-10-CM

## 2023-01-11 DIAGNOSIS — M5030 Other cervical disc degeneration, unspecified cervical region: Secondary | ICD-10-CM

## 2023-01-11 DIAGNOSIS — M19042 Primary osteoarthritis, left hand: Secondary | ICD-10-CM | POA: Insufficient documentation

## 2023-01-11 DIAGNOSIS — J301 Allergic rhinitis due to pollen: Secondary | ICD-10-CM | POA: Diagnosis not present

## 2023-01-11 DIAGNOSIS — M1380 Other specified arthritis, unspecified site: Secondary | ICD-10-CM

## 2023-01-11 DIAGNOSIS — M51369 Other intervertebral disc degeneration, lumbar region without mention of lumbar back pain or lower extremity pain: Secondary | ICD-10-CM

## 2023-01-11 DIAGNOSIS — M19041 Primary osteoarthritis, right hand: Secondary | ICD-10-CM

## 2023-01-11 DIAGNOSIS — M8589 Other specified disorders of bone density and structure, multiple sites: Secondary | ICD-10-CM | POA: Diagnosis not present

## 2023-01-11 DIAGNOSIS — R55 Syncope and collapse: Secondary | ICD-10-CM

## 2023-01-11 DIAGNOSIS — M5136 Other intervertebral disc degeneration, lumbar region: Secondary | ICD-10-CM | POA: Diagnosis not present

## 2023-01-11 MED ORDER — LIDOCAINE HCL 1 % IJ SOLN
0.5000 mL | INTRAMUSCULAR | Status: AC | PRN
Start: 2023-01-11 — End: 2023-01-11
  Administered 2023-01-11: .5 mL

## 2023-01-11 MED ORDER — TRIAMCINOLONE ACETONIDE 40 MG/ML IJ SUSP
10.0000 mg | INTRAMUSCULAR | Status: AC | PRN
Start: 2023-01-11 — End: 2023-01-11
  Administered 2023-01-11: 10 mg via INTRA_ARTICULAR

## 2023-01-23 ENCOUNTER — Other Ambulatory Visit: Payer: Self-pay

## 2023-02-12 HISTORY — PX: BLADDER REPAIR: SHX76

## 2023-02-21 DIAGNOSIS — Z974 Presence of external hearing-aid: Secondary | ICD-10-CM | POA: Diagnosis not present

## 2023-02-21 DIAGNOSIS — J449 Chronic obstructive pulmonary disease, unspecified: Secondary | ICD-10-CM | POA: Diagnosis not present

## 2023-02-21 DIAGNOSIS — Z01118 Encounter for examination of ears and hearing with other abnormal findings: Secondary | ICD-10-CM | POA: Diagnosis not present

## 2023-02-21 DIAGNOSIS — I340 Nonrheumatic mitral (valve) insufficiency: Secondary | ICD-10-CM | POA: Diagnosis not present

## 2023-02-21 DIAGNOSIS — I10 Essential (primary) hypertension: Secondary | ICD-10-CM | POA: Diagnosis not present

## 2023-02-21 DIAGNOSIS — H903 Sensorineural hearing loss, bilateral: Secondary | ICD-10-CM | POA: Diagnosis not present

## 2023-02-22 DIAGNOSIS — Z91030 Bee allergy status: Secondary | ICD-10-CM | POA: Diagnosis not present

## 2023-02-22 DIAGNOSIS — N8189 Other female genital prolapse: Secondary | ICD-10-CM | POA: Diagnosis not present

## 2023-02-22 DIAGNOSIS — N8110 Cystocele, unspecified: Secondary | ICD-10-CM | POA: Diagnosis not present

## 2023-02-22 DIAGNOSIS — R32 Unspecified urinary incontinence: Secondary | ICD-10-CM | POA: Diagnosis not present

## 2023-02-22 DIAGNOSIS — Z888 Allergy status to other drugs, medicaments and biological substances status: Secondary | ICD-10-CM | POA: Diagnosis not present

## 2023-02-22 DIAGNOSIS — Z79899 Other long term (current) drug therapy: Secondary | ICD-10-CM | POA: Diagnosis not present

## 2023-02-22 DIAGNOSIS — I340 Nonrheumatic mitral (valve) insufficiency: Secondary | ICD-10-CM | POA: Diagnosis not present

## 2023-02-23 DIAGNOSIS — Z79899 Other long term (current) drug therapy: Secondary | ICD-10-CM | POA: Diagnosis not present

## 2023-02-23 DIAGNOSIS — R32 Unspecified urinary incontinence: Secondary | ICD-10-CM | POA: Diagnosis not present

## 2023-02-23 DIAGNOSIS — Z888 Allergy status to other drugs, medicaments and biological substances status: Secondary | ICD-10-CM | POA: Diagnosis not present

## 2023-02-23 DIAGNOSIS — I340 Nonrheumatic mitral (valve) insufficiency: Secondary | ICD-10-CM | POA: Diagnosis not present

## 2023-02-23 DIAGNOSIS — Z91030 Bee allergy status: Secondary | ICD-10-CM | POA: Diagnosis not present

## 2023-02-23 DIAGNOSIS — N8110 Cystocele, unspecified: Secondary | ICD-10-CM | POA: Diagnosis not present

## 2023-02-27 ENCOUNTER — Other Ambulatory Visit: Payer: Medicare Other | Admitting: Rheumatology

## 2023-03-06 DIAGNOSIS — R399 Unspecified symptoms and signs involving the genitourinary system: Secondary | ICD-10-CM | POA: Diagnosis not present

## 2023-03-08 ENCOUNTER — Emergency Department (HOSPITAL_BASED_OUTPATIENT_CLINIC_OR_DEPARTMENT_OTHER): Payer: Medicare Other

## 2023-03-08 ENCOUNTER — Encounter (HOSPITAL_BASED_OUTPATIENT_CLINIC_OR_DEPARTMENT_OTHER): Payer: Self-pay

## 2023-03-08 ENCOUNTER — Emergency Department (HOSPITAL_BASED_OUTPATIENT_CLINIC_OR_DEPARTMENT_OTHER): Payer: Medicare Other | Admitting: Radiology

## 2023-03-08 ENCOUNTER — Emergency Department (HOSPITAL_BASED_OUTPATIENT_CLINIC_OR_DEPARTMENT_OTHER)
Admission: EM | Admit: 2023-03-08 | Discharge: 2023-03-08 | Disposition: A | Discharge status: Home / Self Care | Payer: Medicare Other | Attending: Emergency Medicine | Admitting: Emergency Medicine

## 2023-03-08 ENCOUNTER — Other Ambulatory Visit: Payer: Self-pay

## 2023-03-08 DIAGNOSIS — M1712 Unilateral primary osteoarthritis, left knee: Secondary | ICD-10-CM | POA: Diagnosis not present

## 2023-03-08 DIAGNOSIS — M11862 Other specified crystal arthropathies, left knee: Secondary | ICD-10-CM | POA: Diagnosis not present

## 2023-03-08 DIAGNOSIS — M10062 Idiopathic gout, left knee: Secondary | ICD-10-CM | POA: Diagnosis not present

## 2023-03-08 DIAGNOSIS — M25562 Pain in left knee: Secondary | ICD-10-CM | POA: Diagnosis not present

## 2023-03-08 DIAGNOSIS — M7122 Synovial cyst of popliteal space [Baker], left knee: Secondary | ICD-10-CM | POA: Diagnosis not present

## 2023-03-08 DIAGNOSIS — M7989 Other specified soft tissue disorders: Secondary | ICD-10-CM | POA: Diagnosis not present

## 2023-03-08 DIAGNOSIS — M79605 Pain in left leg: Secondary | ICD-10-CM | POA: Diagnosis not present

## 2023-03-08 MED ORDER — IBUPROFEN 400 MG PO TABS
400.0000 mg | ORAL_TABLET | Freq: Once | ORAL | Status: AC
  Administered 2023-03-08: 400 mg via ORAL
  Filled 2023-03-08: qty 1

## 2023-03-08 MED ORDER — HYDROCODONE-ACETAMINOPHEN 5-325 MG PO TABS
1.0000 | ORAL_TABLET | Freq: Once | ORAL | Status: AC
  Administered 2023-03-08: 1 via ORAL
  Filled 2023-03-08: qty 1

## 2023-03-08 MED ORDER — HYDROCODONE-ACETAMINOPHEN 5-325 MG PO TABS: 1.0000 | ORAL_TABLET | Freq: Three times a day (TID) | ORAL | 0 refills | Status: AC | PRN

## 2023-03-08 MED ORDER — IBUPROFEN 600 MG PO TABS: 600.0000 mg | ORAL_TABLET | Freq: Three times a day (TID) | ORAL | 0 refills | Status: DC

## 2023-03-08 NOTE — ED Notes (Signed)
Pt discharged home with husband. Pt refused walker at this time.

## 2023-03-08 NOTE — Discharge Instructions (Addendum)
You were seen in the emergency room for knee pain.  It appears that you have previous history of calcium pyrophosphate arthritis.  We performed an ultrasound that reveals that you also have Bakers cyst in the back of your knee, which could also be the cause for your symptoms.  The x-ray does not reveal significant fluid in your knee.  Please take the medications prescribed for pain control.  Follow-up with orthopedic surgery soon as possible.

## 2023-03-08 NOTE — ED Provider Notes (Signed)
[place] EMERGENCY DEPARTMENT AT [facility] Provider Note   CSN: [id number] Arrival date & time: 03/08/23  4098     History  Chief Complaint  Patient presents with   Knee Pain    Wallace Cogliano is a 77 y.o. female.  HPI    77 year old female comes in with chief complaint of acute left knee pain.  Patient states that she started experiencing knee pain about 2 days ago.  She went to bed 2 nights ago and had her lower extremity over a pillow.  When she woke up yesterday, she had discomfort over the hamstring which has resolved on the right side, but the left knee pain is just worsened.  She has also noticed some swelling over the medial aspect of the left knee.  Patient has history of pseudogout.  Her pain is similar, severe.  She has taken 800 mg ibuprofen without any significant relief.  She indicates that last year she needed admission to the hospital because of severe knee pain.  Patient currently lives at independent living facility.  Home Medications Prior to Admission medications   Medication Sig Start Date End Date Taking? Authorizing Provider  HYDROcodone-acetaminophen (NORCO/VICODIN) 5-325 MG tablet Take 1 tablet by mouth every 8 (eight) hours as needed for up to 3 days. 03/08/23 03/11/23 Yes Derwood Kaplan, MD  acyclovir (ZOVIRAX) 200 MG capsule Take 200 mg by mouth daily.    [provider]  clobetasol (TEMOVATE) 0.05 % external solution Apply topically. 10/11/22   [provider]  colchicine 0.6 MG tablet Take 1 tablet (0.6 mg total) by mouth daily. Patient taking differently: Take 0.6 mg by mouth as needed. 11/15/22   Pollyann Savoy, MD  conjugated estrogens (PREMARIN) vaginal cream Place vaginally. 06/03/18   [provider]  diclofenac Sodium (VOLTAREN) 1 % GEL Apply topically as needed. 01/08/20   [provider]  estrogens, conjugated, (PREMARIN) 0.625 MG tablet Take 0.625 mg by mouth daily.     [provider]  FERREX 150 150 MG capsule Take 150 mg by mouth daily with breakfast.    [provider]  finasteride (PROSCAR) 5 MG tablet Take 5 mg by mouth daily.    [provider]  fluticasone (FLONASE) 50 MCG/ACT nasal spray Place 2 sprays into both nostrils daily as needed for allergies or rhinitis.    [provider]  Hyoscyamine Sulfate SL 0.125 MG SUBL PLACE 1-2 TABLETS UNDER THE TONGUE AND ALLOW TO DISSOLVE AS NEEDED EVERY 6-8 HOURS    [provider]  ibuprofen (ADVIL) 600 MG tablet Take 1 tablet (600 mg total) by mouth 3 (three) times daily. 03/08/23   Derwood Kaplan, MD  ketoconazole (NIZORAL) 2 % shampoo SMARTSIG:Topical 2-3 Times Weekly 12/11/22   [provider]  Meth-Hyo-M Bl-Na Phos-Ph Sal (URIBEL) 118 MG CAPS Take by mouth. 04/01/20   [provider]  Methen-Bella-Meth Bl-Phen Sal (URISED PO) Take 1 tablet by mouth daily as needed (for bladder spasms).    [provider]  metroNIDAZOLE (METROGEL) 0.75 % vaginal gel Place vaginally. 04/01/20   [provider]  minoxidil (LONITEN) 2.5 MG tablet Take by mouth. 10/11/22 10/11/23  [provider]  Multiple Vitamins-Minerals (SYSTANE ICAPS AREDS2 PO) Take by mouth. Patient not taking: Reported on 01/11/2023    [provider]  nitrofurantoin, macrocrystal-monohydrate, (MACROBID) 100 MG capsule  04/01/20   [provider]  nystatin-triamcinolone (MYCOLOG II) cream as needed. 05/15/19   [provider]  omeprazole (PRILOSEC)  20 MG capsule Take 20 mg by mouth daily as needed (for reflux).    [provider]  spironolactone (ALDACTONE) 50 MG tablet Take 1 tablet (50 mg total) by mouth daily. Patient taking differently: Take 50 mg by mouth 3 (three) times daily. 09/09/13   Creola Corn, MD  Vitamin D, Ergocalciferol, (DRISDOL) 1.25 MG (50000 UNIT) CAPS capsule Take 50,000 Units by mouth every Saturday.    [provider]   zaleplon (SONATA) 10 MG capsule TAKE ONE CAPSULE AT BEDTIME AS NEEDED FOR SLEEP Patient taking differently: Take 10 mg by mouth at bedtime as needed. 11/02/21   Cottle, Steva Ready., MD      Allergies    Plaquenil [hydroxychloroquine sulfate], Bee venom, Other, and Terconazole    Review of Systems   Review of Systems  All other systems reviewed and are negative.   Physical Exam Updated Vital Signs BP (!) 166/92   Pulse 88   Temp 97.6 F (36.4 C) (Oral)   Resp 18   Ht 4\' 11"  (1.499 m)   Wt 44.8 kg   SpO2 98%   BMI 19.96 kg/m  Physical Exam Vitals and nursing note reviewed.  Constitutional:      Appearance: She is well-developed.  HENT:     Head: Atraumatic.  Cardiovascular:     Rate and Rhythm: Normal rate.  Pulmonary:     Effort: Pulmonary effort is normal.  Musculoskeletal:        General: Swelling and tenderness present. No deformity.     Cervical back: Normal range of motion and neck supple.     Comments: Patient has mild edema noted over the medial aspect of the left knee.  She has tenderness to palpation over the medial aspect of the knee.  There is no associated rubor or calor.  Skin:    General: Skin is warm and dry.  Neurological:     Mental Status: She is alert and oriented to person, place, and time.     ED Results / Procedures / Treatments   Labs (all labs ordered are listed, but only abnormal results are displayed) Labs Reviewed - No data to display  EKG None  Radiology US Venous Img Lower Unilateral Left  Result Date: 03/08/2023 CLINICAL DATA:  Pain and swelling EXAM: Left LOWER EXTREMITY VENOUS DOPPLER ULTRASOUND TECHNIQUE: Gray-scale sonography with compression, as well as color and duplex ultrasound, were performed to evaluate the deep venous system(s) from the level of the common femoral vein through the popliteal and proximal calf veins. COMPARISON:  None Available. FINDINGS: VENOUS Normal compressibility of the common femoral, superficial  femoral, and popliteal veins, as well as the visualized calf veins. Visualized portions of profunda femoral vein and great saphenous vein unremarkable. No filling defects to suggest DVT on grayscale or color Doppler imaging. Doppler waveforms show normal direction of venous flow, normal respiratory plasticity and response to augmentation. Limited views of the contralateral common femoral vein are unremarkable. OTHER 5.9 x 1.9 x 2.5 cm left popliteal cyst is seen. Limitations: none IMPRESSION: There is no evidence of deep venous thrombosis in left lower extremity. 5.9 x 1.9 x 2.5 cm left popliteal cyst is seen. Electronically Signed   By: Ernie Avena M.D.   On: 03/08/2023 08:48   DG Knee Complete 4 Views Left  Result Date: 03/08/2023 CLINICAL DATA:  Left knee pain and swelling.  History of gout EXAM: LEFT KNEE - COMPLETE 4+ VIEW COMPARISON:  None Available. FINDINGS: No evidence of fracture,  malalignment or knee joint effusion. Faint chondrocalcinosis is present within the lateral compartment. Degenerative changes are also present with narrowing of the joint spaces and early osteophyte formation. Some calcification is also noted within the vastus lateralis muscle fibers suggesting sequelae of prior injury. IMPRESSION: 1. No fracture, malalignment or joint effusion. 2. Mild tricompartmental degenerative osteoarthritis. 3. Chondrocalcinosis. Electronically Signed   By: Malachy Moan M.D.   On: 03/08/2023 07:38    Procedures Procedures    Medications Ordered in ED Medications  HYDROcodone-acetaminophen (NORCO/VICODIN) 5-325 MG per tablet 1 tablet (1 tablet Oral Given 03/08/23 0750)  ibuprofen (ADVIL) tablet 400 mg (400 mg Oral Given 03/08/23 1610)    ED Course/ Medical Decision Making/ A&P                             Medical Decision Making Amount and/or Complexity of Data Reviewed Radiology: ordered.  Risk Prescription drug management.  77 year old patient comes in with chief  complaint of knee pain.  I have reviewed patient's records including synovial fluid assessment that was completed last year.  It appears that patient had calcium pyrophosphate deposits in her right knee.  However patient states that her right knee was never tapped.  Her left knee was the culprit last time as well.  Based on initial assessment, differential diagnosis considered for this monoarticular pain includes osteoarthritis flareup, gout, pseudogout, septic arthritis, hemarthrosis, meniscal tear.  Pseudogout is another possibility.  I ordered x-ray of the left knee.  I independently interpreted the x-ray, there is no clear evidence of significant arthritis and there is no evidence of large knee effusion.  Therefore I proceeded to get an ultrasound to evaluate for popliteal cyst.  Patient does have a left popliteal cyst.  It is not ruptured.  It could be causing bursitis and current pain, especially in the setting of no significant effusion.  I have consulted orthopedic clinic with Dr. August Saucer.  They are able to see the patient on Monday.   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

## 2023-03-08 NOTE — ED Triage Notes (Signed)
Patient complains of left knee pain. States she was dx with gout last year in the same knee and this feels the same. 10/10 pain.

## 2023-03-08 NOTE — ED Notes (Signed)
Patient transported to US 

## 2023-03-08 NOTE — ED Notes (Signed)
Attempted to walk pt to bathroom with walker. Pt said she couldn't maneuver with it and requested wheelchair. EDP aware

## 2023-03-12 ENCOUNTER — Ambulatory Visit (INDEPENDENT_AMBULATORY_CARE_PROVIDER_SITE_OTHER): Payer: Medicare Other | Admitting: Orthopedic Surgery

## 2023-03-12 DIAGNOSIS — M1712 Unilateral primary osteoarthritis, left knee: Secondary | ICD-10-CM | POA: Diagnosis not present

## 2023-03-12 DIAGNOSIS — M1711 Unilateral primary osteoarthritis, right knee: Secondary | ICD-10-CM

## 2023-03-13 ENCOUNTER — Encounter: Payer: Self-pay | Admitting: Orthopedic Surgery

## 2023-03-13 NOTE — Progress Notes (Signed)
Office Visit Note   Patient: Haley Wade           Date of Birth: 1945/08/17           MRN: [id number] Visit Date: 03/12/2023 Requested by: Creola Corn, MD [address] [place],  [place] [address] PCP: Creola Corn, MD  Subjective: Chief Complaint  Patient presents with   Left Knee - Pain    HPI: Haley Wade is a 77 y.o. female who presents to the office reporting left knee pain.  She went to the emergency department 03/08/2023.  Negative Doppler at that time.  Did show a small popliteal cyst.  Knee radiographs do show arthritis in the knee including the medial compartment.  Had a previous left knee injection 12/18/2022.  She does report some posterior pain.  Has a known history of pseudogout.  Taking Norco and ibuprofen.  Recovering from surgery in the pelvic region..                ROS: All systems reviewed are negative as they relate to the chief complaint within the history of present illness.  Patient denies fevers or chills.  Assessment & Plan: Visit Diagnoses:  1. Unilateral primary osteoarthritis, right knee   2. Arthritis of left knee     Plan: Impression is left knee pain with trace effusion and history of pseudogout.  Second injection performed today into the knee for pain relief.  She does have some arthritis in that knee as well.  Will see how she does with this injection.  She may have to consider some activity modification based on the mild radiographic arthritis and meniscal calcifications present in the knee.  Follow-Up Instructions: No follow-ups on file.   Orders:  No orders of the defined types were placed in this encounter.  No orders of the defined types were placed in this encounter.     Procedures: No procedures performed   Clinical Data: No additional findings.  Objective: Vital Signs: There were no vitals taken for this visit.  Physical Exam:  Constitutional: Patient appears well-developed HEENT:  Head:  Normocephalic Eyes:EOM are normal Neck: Normal range of motion Cardiovascular: Normal rate Pulmonary/chest: Effort normal Neurologic: Patient is alert Skin: Skin is warm Psychiatric: Patient has normal mood and affect  Ortho Exam: Ortho exam demonstrates trace effusion in the left knee.  Cannot really palpate the popliteal cyst visualized on the Doppler.  She has good range of motion lacking only about 3 degrees of full extension on that left-hand side.  Collateral crucial ligaments are stable.  No groin pain with internal or external rotation of that left leg.  Specialty Comments:  MRI LUMBAR SPINE WITHOUT CONTRAST   TECHNIQUE: Multiplanar, multisequence MR imaging of the lumbar spine was performed. No intravenous contrast was administered.   COMPARISON:  None Available.   FINDINGS: Segmentation:  Standard.   Alignment:  2 mm anterolisthesis of L3 on L4.   Vertebrae: No acute fracture, evidence of discitis, or aggressive bone lesion.   Conus medullaris and cauda equina: Conus extends to the L1 level. Conus and cauda equina appear normal.   Paraspinal and other soft tissues: No acute paraspinal abnormality. 2 cm right hepatic cyst again noted better seen on CT abdomen/pelvis dated 09/16/2021.   Disc levels:   Disc spaces: Degenerative disease with disc height loss at L3-4 and more severe at L4-5 and L5-S1. Disc desiccation throughout the remainder of the lumbar spine.   T12-L1: No significant disc  bulge. No neural foraminal stenosis. No central canal stenosis.   L1-L2: Minimal broad-based disc bulge. No foraminal or central canal stenosis.   L2-L3: Mild broad-based disc bulge. Mild bilateral facet arthropathy. Mild spinal stenosis. No foraminal or central stenosis.   L3-L4: Minimal broad-based disc bulge with a broad shallow left paracentral/foraminal disc protrusion. Mild bilateral facet arthropathy. Mild spinal stenosis. Bilateral lateral recess narrowing.    L4-L5: Broad-based disc bulge. No foraminal or central canal stenosis.   L5-S1: Mild broad-based disc bulge. Mild bilateral foraminal stenosis. No spinal stenosis.   IMPRESSION: 1. Lumbar spine spondylosis as described above. 2.  No acute osseous injury of the lumbar spine.     Electronically Signed   By: Elige Ko M.D.   On: 04/24/2022 09:21  Imaging: No results found.   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   Inflammatory arthritis 11/16/2021   Complete tear of right rotator cuff    Biceps tendonitis on right    S/P rotator cuff repair 02/08/2021   Chest discomfort 01/26/2014   Palpitations 01/26/2014   Cellulitis 09/08/2013   Abdominal pain, left mid abdomen, chronic 02/26/2013   Hyponatremia 10/30/2012   COPD, questioned 01/02/2012   Allergic rhinitis due to pollen 10/20/2010   MITRAL VALVE PROLAPSE, HX OF 05/26/2009   UNSPECIFIED HEARING LOSS 05/21/2009   RHINOSINUSITIS, 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   Past Medical History:  Diagnosis Date   Allergic rhinitis, cause unspecified    Anemia    Arthritis    Cellulitis    Chest discomfort    COPD, questioned    GAVE (gastric antral vascular ectasia)    Hyponatremia    MITRAL VALVE PROLAPSE, HX OF    Other chronic sinusitis    Palpitations    Syncope    Unspecified hearing loss     Family History  Problem Relation Age of Onset   Other Mother        c difficile gastroenteritis   Lung cancer Father        smoker   Liver disease Brother    Liver cancer Brother    Asthma Other        grandmother   Other Other        bronchitis-grandmother    Past Surgical History:  Procedure Laterality Date   APPENDECTOMY     BACK SURGERY     BICEPT TENODESIS Right 02/08/2021   Procedure: BICEPS TENODESIS;   Surgeon: Cammy Copa, MD;  Location: [facility] OR;  Service: Orthopedics;  Laterality: Right;   BLADDER SURGERY     BOWEL RESECTION N/A 11/01/2012   Procedure: SMALL BOWEL RESECTION;  Surgeon: [name], MD;  Location: [facility] ORS;  Service: General;  Laterality: N/A;   BREAST ENHANCEMENT SURGERY     revisions   BREAST SURGERY     CHOLECYSTECTOMY     COLON SURGERY     ESOPHAGOGASTRODUODENOSCOPY (EGD) WITH PROPOFOL N/A 10/14/2019   Procedure: ESOPHAGOGASTRODUODENOSCOPY (EGD) WITH PROPOFOL;  Surgeon: Vida Rigger, MD;  Location: [facility];  Service: Endoscopy;  Laterality: N/A;   EYE SURGERY     GI RADIOFREQUENCY ABLATION  10/14/2019   Procedure: GI RADIOFREQUENCY ABLATION;  Surgeon: Vida Rigger, MD;  Location: [facility];  Service: Endoscopy;;   LAPAROSCOPY N/A 11/01/2012   Procedure: LAPAROSCOPY DIAGNOSTIC;  Surgeon: Tawanna Cooler  Molly Maduro, MD;  Location: [facility];  Service: General;  Laterality: N/A;   LAPAROTOMY N/A 11/01/2012   Procedure: EXPLORATORY LAPAROTOMY;  Surgeon: [name], MD;  Location: [facility];  Service: General;  Laterality: N/A;   LYSIS OF ADHESION N/A 11/01/2012   Procedure: LYSIS OF ADHESION;  Surgeon: [name], MD;  Location: [facility];  Service: General;  Laterality: N/A;   RHINOPLASTY     SHOULDER ARTHROSCOPY WITH OPEN ROTATOR CUFF REPAIR AND DISTAL CLAVICLE ACROMINECTOMY Right 02/08/2021   Procedure: SHOULDER ARTHROSCOPY WITH BICEPS TENDON RELEASE, MINI OPEN ROTATOR CUFF TEAR REPAIR OF THE INFRASPINATUS SUPRASPINATUS AND UPPER PORTIION OF THE SUBSCAP  WITH BICEPS TENODESIS;  Surgeon: Cammy Copa, MD;  Location: [facility] OR;  Service: Orthopedics;  Laterality: Right;   TONSILLECTOMY     TOTAL ABDOMINAL HYSTERECTOMY     Social History   Occupational History   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

## 2023-03-16 DIAGNOSIS — N3946 Mixed incontinence: Secondary | ICD-10-CM | POA: Diagnosis not present

## 2023-03-26 DIAGNOSIS — L089 Local infection of the skin and subcutaneous tissue, unspecified: Secondary | ICD-10-CM | POA: Diagnosis not present

## 2023-03-26 DIAGNOSIS — R208 Other disturbances of skin sensation: Secondary | ICD-10-CM | POA: Diagnosis not present

## 2023-03-26 DIAGNOSIS — L650 Telogen effluvium: Secondary | ICD-10-CM | POA: Diagnosis not present

## 2023-03-26 DIAGNOSIS — L219 Seborrheic dermatitis, unspecified: Secondary | ICD-10-CM | POA: Diagnosis not present

## 2023-03-26 DIAGNOSIS — L658 Other specified nonscarring hair loss: Secondary | ICD-10-CM | POA: Diagnosis not present

## 2023-04-03 DIAGNOSIS — N814 Uterovaginal prolapse, unspecified: Secondary | ICD-10-CM | POA: Diagnosis not present

## 2023-04-06 ENCOUNTER — Other Ambulatory Visit: Payer: Medicare Other | Admitting: Rheumatology

## 2023-04-06 DIAGNOSIS — M79644 Pain in right finger(s): Secondary | ICD-10-CM

## 2023-04-11 ENCOUNTER — Encounter: Payer: Self-pay | Admitting: Psychiatry

## 2023-04-11 ENCOUNTER — Ambulatory Visit (INDEPENDENT_AMBULATORY_CARE_PROVIDER_SITE_OTHER): Payer: Medicare Other | Admitting: Psychiatry

## 2023-04-11 DIAGNOSIS — F5105 Insomnia due to other mental disorder: Secondary | ICD-10-CM

## 2023-04-11 DIAGNOSIS — F411 Generalized anxiety disorder: Secondary | ICD-10-CM

## 2023-04-11 MED ORDER — ZALEPLON 10 MG PO CAPS
10.0000 mg | ORAL_CAPSULE | Freq: Every evening | ORAL | 0 refills | Status: DC | PRN
Start: 2023-04-11 — End: 2024-01-09

## 2023-04-11 NOTE — Progress Notes (Signed)
Haley Wade [id number] Jan 13, 1946 77 y.o.  Subjective:   Patient ID:  Haley Wade is a 77 y.o. (DOB [birth year]) female.  Chief Complaint:  Chief Complaint  Patient presents with   Follow-up    HPI Haley Wade presents to the office today for follow-up of anxiety and insomnia.  Challenging winter with health issues with GI problems.  Heart monitor and cardiac workup with palpitations.  Fall last week after starting morning walk and tripped.  Problems with EMA and doesn't want to get used to taking zaleplon and about 3-4 times per week.  Has used imagery and other techniques to help sleep. 1 and 1/2 cup coffee am only. No hangover with zaleplon.  Weaned off lorazepam HS and then only prn.  Last used 02/08/21. No panic lately. And anxiety managed. Patient reports stable mood and denies depressed or irritable moods.  Patient denies any recent difficulty with anxiety.  Denies appetite disturbance.  Patient reports that energy and motivation have been good.  Patient denies any difficulty with concentration.  Patient denies any suicidal ideation.  11/21/21 appt noted: Biggest issue is sleep.  Trazoadone SE constipation and hair loss.  Using zaleplon intermittently doesn't work well. Underlying stress.  Getting married.  Not sure what to do about some stress over it.. SE Mirtazapine would never take it. Wants to take intermittent lorazepam. Pressure in her head with normal BP and on her scalp with stinging and itching or burning or pain behind right eye sometimes. Working 3 days weekly.  07/10/2022 appointment noted: Still on sonata 5 mg prn.  No SE.  No hangover. Most of the time sleeps well. Married and that makes sleep easier.    They moved into house together.  Happy . Plans to retire for the last time. Lives at [place] and loves it. Not much anxiety now.   Situation better has changed her life for the better without depression or anxiety. Both  strong Christians.  Devotions together.    04/11/23 appt noted: No sig anxiety.  Retired finally.  Remarried and a lot of joy from that.  Strong Haley Wade is blessing.  15 mos ago. Exercise together and trips .    Goes to a lot of events.   New set of friends at [employer].   No dep. No SE with Sonata.  Worries about hair loss.   Past Psychiatric Medication Trials: Past Psychiatric History:  Long history of counseling Remote antidepressants without help and Seroquel NR & SE. On lorazepam for years. SE sometimes hair loss.  But still the best. Alprazolam short duration.  Klonopin SE. SE Mirtazapine would never take it.   Flowsheet Row ED from 03/08/2023 in [facility] Emergency Department at [facility] ED from 04/11/2022 in [facility] Emergency Department at [facility] ED from 03/09/2022 in [facility] Emergency Department at [facility]  C-SSRS RISK CATEGORY No Risk No Risk No Risk        Review of Systems:  Review of Systems  Cardiovascular:  Positive for palpitations.  Skin:        Hair loss  Neurological:  Negative for tremors.  Psychiatric/Behavioral:  Positive for sleep disturbance. Negative for dysphoric mood. The patient is not nervous/anxious.     Medications: I have reviewed the patient's current medications.  Current Outpatient Medications  Medication Sig Dispense Refill   acyclovir (ZOVIRAX) 200 MG capsule Take 200 mg by mouth daily.     clobetasol (TEMOVATE) 0.05 % external solution  Apply topically.     colchicine 0.6 MG tablet Take 1 tablet (0.6 mg total) by mouth daily. (Patient taking differently: Take 0.6 mg by mouth as needed.) 30 tablet 2   conjugated estrogens (PREMARIN) vaginal cream Place vaginally.     diclofenac Sodium (VOLTAREN) 1 % GEL Apply topically as needed.     estrogens, conjugated, (PREMARIN) 0.625 MG tablet Take 0.625 mg by mouth daily.      FERREX 150 150 MG capsule Take 150 mg by mouth daily with breakfast.      finasteride (PROSCAR) 5 MG tablet Take 5 mg by mouth daily.     fluticasone (FLONASE) 50 MCG/ACT nasal spray Place 2 sprays into both nostrils daily as needed for allergies or rhinitis.     Hyoscyamine Sulfate SL 0.125 MG SUBL PLACE 1-2 TABLETS UNDER THE TONGUE AND ALLOW TO DISSOLVE AS NEEDED EVERY 6-8 HOURS     ibuprofen (ADVIL) 600 MG tablet Take 1 tablet (600 mg total) by mouth 3 (three) times daily. 15 tablet 0   ketoconazole (NIZORAL) 2 % shampoo SMARTSIG:Topical 2-3 Times Weekly     Meth-Hyo-M Bl-Na Phos-Ph Sal (URIBEL) 118 MG CAPS Take by mouth.     Methen-Bella-Meth Bl-Phen Sal (URISED PO) Take 1 tablet by mouth daily as needed (for bladder spasms).     metroNIDAZOLE (METROGEL) 0.75 % vaginal gel Place vaginally.     minoxidil (LONITEN) 2.5 MG tablet Take by mouth.     Multiple Vitamins-Minerals (SYSTANE ICAPS AREDS2 PO) Take by mouth.     nitrofurantoin, macrocrystal-monohydrate, (MACROBID) 100 MG capsule      nystatin-triamcinolone (MYCOLOG II) cream as needed.     omeprazole (PRILOSEC) 20 MG capsule Take 20 mg by mouth daily as needed (for reflux).     spironolactone (ALDACTONE) 50 MG tablet Take 1 tablet (50 mg total) by mouth daily. (Patient taking differently: Take 50 mg by mouth 3 (three) times daily.)     Vitamin D, Ergocalciferol, (DRISDOL) 1.25 MG (50000 UNIT) CAPS capsule Take 50,000 Units by mouth every Saturday.     zaleplon (SONATA) 10 MG capsule Take 1 capsule (10 mg total) by mouth at bedtime as needed. 30 capsule 0   No current facility-administered medications for this visit.    Medication Side Effects: None  Allergies:  Allergies  Allergen Reactions   Plaquenil [Hydroxychloroquine Sulfate] Rash and Other (See Comments)    "Covered all over in a rash and welts"   Bee Venom Swelling and Other (See Comments)    Swelling at site where stung   Other     Scented lotions, soaps, shampoos/conditioners.    Terconazole Nausea Only and Other (See Comments)     Headaches, also    Past Medical History:  Diagnosis Date   Allergic rhinitis, cause unspecified    Anemia    Arthritis    Cellulitis    Chest discomfort    COPD, questioned    GAVE (gastric antral vascular ectasia)    Hyponatremia    MITRAL VALVE PROLAPSE, HX OF    Other chronic sinusitis    Palpitations    Syncope    Unspecified hearing loss     Past Medical History, Surgical history, Social history, and Family history were reviewed and updated as appropriate.   Please see review of systems for further details on the patient's review from today.   Objective:   Physical Exam:  There were no vitals taken for this visit.  Physical Exam Constitutional:  General: She is not in acute distress. Genitourinary:    Rectum: Normal.  Musculoskeletal:        General: No deformity.  Neurological:     Mental Status: She is alert and oriented to person, place, and time.     Coordination: Coordination normal.  Psychiatric:        Attention and Perception: Attention and perception normal. She does not perceive auditory or visual hallucinations.        Mood and Affect: Mood is not anxious or depressed. Affect is not blunt, angry or inappropriate.        Speech: Speech normal. Speech is not slurred.        Behavior: Behavior normal.        Thought Content: Thought content normal. Thought content is not paranoid or delusional. Thought content does not include homicidal or suicidal ideation. Thought content does not include suicidal plan.        Cognition and Memory: Cognition and memory normal.        Judgment: Judgment normal.     Comments: Insight intact Anxiety is better after marriage.     Lab Review:     Component Value Date/Time   NA 135 10/25/2022 0904   K 4.9 10/25/2022 0904   CL 100 10/25/2022 0904   CO2 25 10/25/2022 0904   GLUCOSE 87 10/25/2022 0904   BUN 31 (H) 10/25/2022 0904   CREATININE 0.59 (L) 10/25/2022 0904   CALCIUM 9.8 10/25/2022 0904   PROT 7.2  10/25/2022 0904   ALBUMIN 3.0 (L) 11/17/2021 0009   AST 16 10/25/2022 0904   ALT 15 10/25/2022 0904   ALKPHOS 35 (L) 11/17/2021 0009   BILITOT 0.4 10/25/2022 0904   GFRNONAA >60 01/23/2022 0735   GFRAA >90 09/09/2013 0617       Component Value Date/Time   WBC 8.4 10/25/2022 0904   RBC 4.69 10/25/2022 0904   HGB 13.2 10/25/2022 0904   HCT 41.1 10/25/2022 0904   PLT 265 10/25/2022 0904   MCV 87.6 10/25/2022 0904   MCH 28.1 10/25/2022 0904   MCHC 32.1 10/25/2022 0904   RDW 12.5 10/25/2022 0904   LYMPHSABS 1,798 10/25/2022 0904   MONOABS 0.7 01/23/2022 0735   EOSABS 84 10/25/2022 0904   BASOSABS 34 10/25/2022 0904    No results found for: "POCLITH", "LITHIUM"   No results found for: "PHENYTOIN", "PHENOBARB", "VALPROATE", "CBMZ"   .res Assessment: Plan:    Keilei was seen today for follow-up.  Diagnoses and all orders for this visit:  Generalized anxiety disorder  Insomnia due to mental condition -     zaleplon (SONATA) 10 MG capsule; Take 1 capsule (10 mg total) by mouth at bedtime as needed.     very med sensitive. Been able to wean off lorazepam and anxiety controlled.    Pleased with Sonata prn. Disc risk of zaleplon and risk falls etc.  We discussed the short-term risks associated with benzodiazepines including sedation and increased fall risk among others.  Discussed long-term side effect risk including dependence, potential withdrawal symptoms, and the potential eventual dose-related risk of dementia.  But recent studies from 2020 dispute this association between benzodiazepines and dementia risk. Newer studies in 2020 do not support an association with dementia.  Disc this in great detail bc she had questions, as well as alternatives like trazodone or melatonin.  Also disc CBT for sleep. She wants to stick with what she knows and feels comfortable with it. It works well but doesn't  take often.   Wants a prn therapist.  A woman to talk to as needed.  No  other psych med except Sonata 10 mg prn.    FU 12 mos since only rare use Sonata  Meredith Staggers, MD, DFAPA   Please see After Visit Summary for patient specific instructions.  Future Appointments  Date Time Provider Department Center  07/06/2023 10:20 AM Jake Bathe, MD [facility]  07/19/2023  9:40 AM Pollyann Savoy, MD [facility] None    No orders of the defined types were placed in this encounter.   -------------------------------

## 2023-04-26 DIAGNOSIS — H90A21 Sensorineural hearing loss, unilateral, right ear, with restricted hearing on the contralateral side: Secondary | ICD-10-CM | POA: Diagnosis not present

## 2023-04-26 DIAGNOSIS — H903 Sensorineural hearing loss, bilateral: Secondary | ICD-10-CM | POA: Diagnosis not present

## 2023-04-26 DIAGNOSIS — R208 Other disturbances of skin sensation: Secondary | ICD-10-CM | POA: Diagnosis not present

## 2023-04-26 DIAGNOSIS — L905 Scar conditions and fibrosis of skin: Secondary | ICD-10-CM | POA: Diagnosis not present

## 2023-04-26 DIAGNOSIS — H608X1 Other otitis externa, right ear: Secondary | ICD-10-CM | POA: Diagnosis not present

## 2023-05-03 DIAGNOSIS — Z79890 Hormone replacement therapy (postmenopausal): Secondary | ICD-10-CM | POA: Diagnosis not present

## 2023-05-04 DIAGNOSIS — L700 Acne vulgaris: Secondary | ICD-10-CM | POA: Diagnosis not present

## 2023-05-04 DIAGNOSIS — L089 Local infection of the skin and subcutaneous tissue, unspecified: Secondary | ICD-10-CM | POA: Diagnosis not present

## 2023-05-11 ENCOUNTER — Other Ambulatory Visit (HOSPITAL_BASED_OUTPATIENT_CLINIC_OR_DEPARTMENT_OTHER): Payer: Self-pay

## 2023-05-11 MED ORDER — RSVPREF3 VAC RECOMB ADJUVANTED 120 MCG/0.5ML IM SUSR
0.5000 mL | Freq: Once | INTRAMUSCULAR | 0 refills | Status: AC
  Filled 2023-05-11: qty 0.5, 1d supply, fill #0

## 2023-06-18 DIAGNOSIS — L299 Pruritus, unspecified: Secondary | ICD-10-CM | POA: Diagnosis not present

## 2023-06-18 DIAGNOSIS — L650 Telogen effluvium: Secondary | ICD-10-CM | POA: Diagnosis not present

## 2023-06-18 DIAGNOSIS — L658 Other specified nonscarring hair loss: Secondary | ICD-10-CM | POA: Diagnosis not present

## 2023-06-18 DIAGNOSIS — R208 Other disturbances of skin sensation: Secondary | ICD-10-CM | POA: Diagnosis not present

## 2023-06-18 DIAGNOSIS — L089 Local infection of the skin and subcutaneous tissue, unspecified: Secondary | ICD-10-CM | POA: Diagnosis not present

## 2023-06-18 DIAGNOSIS — L219 Seborrheic dermatitis, unspecified: Secondary | ICD-10-CM | POA: Diagnosis not present

## 2023-06-19 DIAGNOSIS — M79643 Pain in unspecified hand: Secondary | ICD-10-CM | POA: Diagnosis not present

## 2023-06-19 DIAGNOSIS — D649 Anemia, unspecified: Secondary | ICD-10-CM | POA: Diagnosis not present

## 2023-06-19 DIAGNOSIS — F419 Anxiety disorder, unspecified: Secondary | ICD-10-CM | POA: Diagnosis not present

## 2023-06-19 DIAGNOSIS — I4510 Unspecified right bundle-branch block: Secondary | ICD-10-CM | POA: Diagnosis not present

## 2023-06-19 DIAGNOSIS — K31819 Angiodysplasia of stomach and duodenum without bleeding: Secondary | ICD-10-CM | POA: Diagnosis not present

## 2023-06-19 DIAGNOSIS — H6990 Unspecified Eustachian tube disorder, unspecified ear: Secondary | ICD-10-CM | POA: Diagnosis not present

## 2023-06-19 DIAGNOSIS — K5900 Constipation, unspecified: Secondary | ICD-10-CM | POA: Diagnosis not present

## 2023-06-19 DIAGNOSIS — M154 Erosive (osteo)arthritis: Secondary | ICD-10-CM | POA: Diagnosis not present

## 2023-06-19 DIAGNOSIS — M5380 Other specified dorsopathies, site unspecified: Secondary | ICD-10-CM | POA: Diagnosis not present

## 2023-06-19 DIAGNOSIS — M8580 Other specified disorders of bone density and structure, unspecified site: Secondary | ICD-10-CM | POA: Diagnosis not present

## 2023-06-19 DIAGNOSIS — K219 Gastro-esophageal reflux disease without esophagitis: Secondary | ICD-10-CM | POA: Diagnosis not present

## 2023-06-19 DIAGNOSIS — I700 Atherosclerosis of aorta: Secondary | ICD-10-CM | POA: Diagnosis not present

## 2023-06-21 DIAGNOSIS — Z23 Encounter for immunization: Secondary | ICD-10-CM | POA: Diagnosis not present

## 2023-06-29 ENCOUNTER — Ambulatory Visit: Payer: Medicare Other | Attending: Cardiology | Admitting: Cardiology

## 2023-06-29 ENCOUNTER — Encounter: Payer: Self-pay | Admitting: Cardiology

## 2023-06-29 VITALS — BP 124/70 | HR 84 | Ht 59.0 in | Wt 100.2 lb

## 2023-06-29 DIAGNOSIS — Z8679 Personal history of other diseases of the circulatory system: Secondary | ICD-10-CM | POA: Insufficient documentation

## 2023-06-29 DIAGNOSIS — R0609 Other forms of dyspnea: Secondary | ICD-10-CM | POA: Diagnosis not present

## 2023-06-29 DIAGNOSIS — I471 Supraventricular tachycardia, unspecified: Secondary | ICD-10-CM | POA: Diagnosis not present

## 2023-06-29 DIAGNOSIS — R079 Chest pain, unspecified: Secondary | ICD-10-CM | POA: Diagnosis not present

## 2023-06-29 DIAGNOSIS — I059 Rheumatic mitral valve disease, unspecified: Secondary | ICD-10-CM | POA: Insufficient documentation

## 2023-06-29 NOTE — Patient Instructions (Signed)
Medication Instructions:  The current medical regimen is effective;  continue present plan and medications.  *If you need a refill on your cardiac medications before your next appointment, please call your pharmacy*  Testing/Procedures: Your physician has requested that you have an echocardiogram. Echocardiography is a painless test that uses sound waves to create images of your heart. It provides your doctor with information about the size and shape of your heart and how well your heart's chambers and valves are working. This procedure takes approximately one hour. There are no restrictions for this procedure. Please do NOT wear cologne, perfume, aftershave, or lotions (deodorant is allowed). Please arrive 15 minutes prior to your appointment time.  Please note: We ask at that you not bring children with you during ultrasound (echo/ vascular) testing. Due to room size and safety concerns, children are not allowed in the ultrasound rooms during exams. Our front office staff cannot provide observation of children in our lobby area while testing is being conducted. An adult accompanying a patient to their appointment will only be allowed in the ultrasound room at the discretion of the ultrasound technician under special circumstances. We apologize for any inconvenience.   Follow-Up: At [facility], you and your health needs are our priority.  As part of our continuing mission to provide you with exceptional heart care, we have created designated Provider Care Teams.  These Care Teams include your primary Cardiologist (physician) and Advanced Practice Providers (APPs -  Physician Assistants and Nurse Practitioners) who all work together to provide you with the care you need, when you need it.  We recommend signing up for the patient portal called "MyChart".  Sign up information is provided on this After Visit Summary.  MyChart is used to connect with patients for Virtual Visits (Telemedicine).   Patients are able to view lab/test results, encounter notes, upcoming appointments, etc.  Non-urgent messages can be sent to your provider as well.   To learn more about what you can do with MyChart, go to [email or web address].    Your next appointment:   1 year(s)  Provider:   Dr Donato Schultz

## 2023-06-29 NOTE — Progress Notes (Signed)
Cardiology Office Note:  .   Date:  06/29/2023  ID:  Rolanda Lundborg, DOB 1946-05-29, MRN [id number] PCP: Creola Corn, MD  [facility] Providers Cardiologist:  Lesleigh Noe, MD (Inactive)     History of Present Illness: .   Nadira Ambrosino is a 77 y.o. female Discussed with the use of AI scribe  History of Present Illness   The patient, a 77 year old female with a history of supraventricular tachycardia (SVT), incomplete right bundle branch block, gastroesophageal reflux disease (GERD), and mitral regurgitation, presents for follow-up. She was previously on a low dose of Toprol XL, which was discontinued due to adverse effects at higher doses. The patient's last nuclear stress test in 2022 was low risk with an ejection fraction of 77%. An event monitor in 2021 showed isolated PVCs correlating with complaints of flutter.  Recently, the patient has been experiencing a clenching sensation in the chest, occurring daily and sometimes multiple times a day. The sensation was described as intense on one occasion, and the patient had to cough to alleviate it. A few weeks prior, the patient experienced a lightheaded episode after a walk, which resolved with rest and deep breathing. The patient also reports occasional shortness of breath.  The patient has a family history of heart disease, with her father having had two heart attacks and her grandmother passing away from a stroke. Despite this, the patient maintains a heart-healthy lifestyle, including regular exercise and a balanced diet.          Studies Reviewed: Marland Kitchen   EKG Interpretation Date/Time:  Friday June 29 2023 16:11:05 EST Ventricular Rate:  84 PR Interval:  104 QRS Duration:  92 QT Interval:  380 QTC Calculation: 449 R Axis:   -57  Text Interpretation: Sinus rhythm with short PR Incomplete right bundle branch block Left anterior fascicular block Septal infarct (cited on or before 29-Jun-2023)  Inferior infarct , age undetermined When compared with ECG of 17-Nov-2021 10:44, Left anterior fascicular block is now Present Confirmed by Donato Schultz ([id number]) on 06/29/2023 4:21:50 PM    Results DIAGNOSTIC Nuclear stress test: Low risk, EF 77% (2022) Event monitor: Isolated PVCs correlated with complaints of flutter (2021) Echocardiogram: Normal systolic function (2015)  Risk Assessment/Calculations:            Physical Exam:   VS:  BP 124/70   Pulse 84   Ht 4\' 11"  (1.499 m)   Wt 100 lb 3.2 oz (45.5 kg)   SpO2 97%   BMI 20.24 kg/m    Wt Readings from Last 3 Encounters:  06/29/23 100 lb 3.2 oz (45.5 kg)  03/08/23 98 lb 12.8 oz (44.8 kg)  01/11/23 99 lb 3.2 oz (45 kg)    GEN: Well nourished, well developed in no acute distress NECK: No JVD; No carotid bruits CARDIAC: RRR, no murmurs, no rubs, no gallops RESPIRATORY:  Clear to auscultation without rales, wheezing or rhonchi  ABDOMEN: Soft, non-tender, non-distended EXTREMITIES:  No edema; No deformity   ASSESSMENT AND PLAN: .    Assessment and Plan    Supraventricular Tachycardia (SVT) Follow-up for SVT. Discontinued Toprol XL due to stomach upset and bloating. Reports daily chest clenching and occasional lightheadedness. Nuclear stress test (2022) was low risk with normal EF of 77%. Event monitor (2021) showed isolated PVCs correlating with flutter complaints. Discussed alternative medications (diltiazem, verapamil) and diagnostic options (echocardiogram, stress test, CT scan). - Order echocardiogram to evaluate heart function - Consider stress test  or CT scan if echocardiogram indicates abnormalities - Consider low-dose diltiazem or verapamil if symptoms persist  Mitral Regurgitation Mitral regurgitation without specific symptoms discussed. - Monitor for new or worsening symptoms  Gastroesophageal Reflux Disease (GERD) GERD exacerbated by previous metoprolol use. - Continue current GERD management - Avoid medications  exacerbating GERD  General Health Maintenance Maintains heart-healthy diet and regular exercise. - Encourage continued heart-healthy diet and regular exercise  Follow-up - Schedule echocardiogram - Follow-up based on echocardiogram results.               Signed, Donato Schultz, MD

## 2023-07-06 ENCOUNTER — Ambulatory Visit: Payer: Medicare Other | Admitting: Cardiology

## 2023-07-09 NOTE — Progress Notes (Unsigned)
Office Visit Note  Patient: Haley Wade             Date of Birth: 12-15-1945           MRN: [id number]             PCP: Creola Corn, MD Referring: Creola Corn, MD Visit Date: 07/19/2023 Occupation: @GUAROCC @  Subjective:  No chief complaint on file.   History of Present Illness: Haley Wade is a 77 y.o. female ***     Activities of Daily Living:  Patient reports morning stiffness for *** {minute/hour:19697}.   Patient {ACTIONS;DENIES/REPORTS:21021675::"Denies"} nocturnal pain.  Difficulty dressing/grooming: {ACTIONS;DENIES/REPORTS:21021675::"Denies"} Difficulty climbing stairs: {ACTIONS;DENIES/REPORTS:21021675::"Denies"} Difficulty getting out of chair: {ACTIONS;DENIES/REPORTS:21021675::"Denies"} Difficulty using hands for taps, buttons, cutlery, and/or writing: {ACTIONS;DENIES/REPORTS:21021675::"Denies"}  No Rheumatology ROS completed.   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   Inflammatory arthritis 11/16/2021   Complete tear of right rotator cuff    Biceps tendonitis on right    S/P rotator cuff repair 02/08/2021   Chest discomfort 01/26/2014   Palpitations 01/26/2014   Cellulitis 09/08/2013   Abdominal pain 02/26/2013   Hyponatremia 10/30/2012   COPD, questioned 01/02/2012   Allergic rhinitis due to pollen 10/20/2010   History of cardiovascular disorder 05/26/2009   Hearing loss 05/21/2009   RHINOSINUSITIS, 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    Past Medical History:  Diagnosis Date   Allergic rhinitis, cause unspecified    Anemia    Arthritis    Cellulitis    Chest discomfort    COPD, questioned    GAVE (gastric antral vascular ectasia)    Hyponatremia    MITRAL VALVE PROLAPSE, HX OF    Other chronic sinusitis    Palpitations     Syncope    Unspecified hearing loss     Family History  Problem Relation Age of Onset   Other Mother        c difficile gastroenteritis   Lung cancer Father        smoker   Liver disease Brother    Liver cancer Brother    Asthma Other        grandmother   Other Other        bronchitis-grandmother   Past Surgical History:  Procedure Laterality Date   APPENDECTOMY     BACK SURGERY     BICEPT TENODESIS Right 02/08/2021   Procedure: BICEPS TENODESIS;  Surgeon: Cammy Copa, MD;  Location: [facility];  Service: Orthopedics;  Laterality: Right;   BLADDER SURGERY     BOWEL RESECTION N/A 11/01/2012   Procedure: SMALL BOWEL RESECTION;  Surgeon: [name], MD;  Location: [facility] ORS;  Service: General;  Laterality: N/A;   BREAST ENHANCEMENT SURGERY     revisions   BREAST SURGERY     CHOLECYSTECTOMY     COLON SURGERY     ESOPHAGOGASTRODUODENOSCOPY (EGD) WITH PROPOFOL N/A 10/14/2019   Procedure: ESOPHAGOGASTRODUODENOSCOPY (EGD) WITH PROPOFOL;  Surgeon: Vida Rigger, MD;  Location: [facility];  Service: Endoscopy;  Laterality: N/A;   EYE SURGERY     GI RADIOFREQUENCY ABLATION  10/14/2019   Procedure: GI RADIOFREQUENCY ABLATION;  Surgeon: Vida Rigger, MD;  Location: [facility];  Service: Endoscopy;;   LAPAROSCOPY N/A 11/01/2012   Procedure: LAPAROSCOPY DIAGNOSTIC;  Surgeon: [name], MD;  Location: [facility];  Service: General;  Laterality: N/A;   LAPAROTOMY N/A 11/01/2012   Procedure: EXPLORATORY LAPAROTOMY;  Surgeon: [name], MD;  Location: [facility];  Service: General;  Laterality: N/A;   LYSIS OF ADHESION N/A 11/01/2012   Procedure: LYSIS OF ADHESION;  Surgeon: [name], MD;  Location: [facility];  Service: General;  Laterality: N/A;   RHINOPLASTY     SHOULDER ARTHROSCOPY WITH OPEN ROTATOR CUFF REPAIR AND DISTAL CLAVICLE ACROMINECTOMY Right 02/08/2021   Procedure: SHOULDER ARTHROSCOPY WITH BICEPS TENDON RELEASE, MINI OPEN ROTATOR CUFF TEAR REPAIR OF THE INFRASPINATUS  SUPRASPINATUS AND UPPER PORTIION OF THE SUBSCAP  WITH BICEPS TENODESIS;  Surgeon: Cammy Copa, MD;  Location: [facility] OR;  Service: Orthopedics;  Laterality: Right;   TONSILLECTOMY     TOTAL ABDOMINAL HYSTERECTOMY     Social History   Social History Narrative   Not on file   Immunization History  Administered Date(s) Administered   Influenza Split 06/26/2011, 06/25/2012, 06/28/2013   Pfizer(Comirnaty)Fall Seasonal Vaccine 12 years and older 08/18/2022   Respiratory Syncytial Virus Vaccine,Recomb Aduvanted(Arexvy) 05/11/2023   Tdap 09/08/2013, 04/13/2021     Objective: Vital Signs: There were no vitals taken for this visit.   Physical Exam   Musculoskeletal Exam: ***  CDAI Exam: CDAI Score: -- Patient Global: --; Provider Global: -- Swollen: --; Tender: -- Joint Exam 07/19/2023   No joint exam has been documented for this visit   There is currently no information documented on the homunculus. Go to the Rheumatology activity and complete the homunculus joint exam.  Investigation: No additional findings.  Imaging: No results found.  Recent Labs: Lab Results  Component Value Date   WBC 8.4 10/25/2022   HGB 13.2 10/25/2022   PLT 265 10/25/2022   NA 135 10/25/2022   K 4.9 10/25/2022   CL 100 10/25/2022   CO2 25 10/25/2022   GLUCOSE 87 10/25/2022   BUN 31 (H) 10/25/2022   CREATININE 0.59 (L) 10/25/2022   BILITOT 0.4 10/25/2022   ALKPHOS 35 (L) 11/17/2021   AST 16 10/25/2022   ALT 15 10/25/2022   PROT 7.2 10/25/2022   ALBUMIN 3.0 (L) 11/17/2021   CALCIUM 9.8 10/25/2022   GFRAA >90 09/09/2013    Speciality Comments: PLQ-Rash  Procedures:  No procedures performed Allergies: Plaquenil [hydroxychloroquine sulfate], Bee venom, Other, and Terconazole   Assessment / Plan:     Visit Diagnoses: No diagnosis found.  Orders: No orders of the defined types were placed in this encounter.  No orders of the defined types were placed in this  encounter.   Face-to-face time spent with patient was *** minutes. Greater than 50% of time was spent in counseling and coordination of care.  Follow-Up Instructions: No follow-ups on file.   Ellen Henri, CMA  Note - This record has been created using Animal nutritionist.  Chart creation errors have been sought, but may not always  have been located. Such creation errors do not reflect on  the standard of medical care.

## 2023-07-11 NOTE — Progress Notes (Signed)
Office Visit Note  Patient: Haley Wade             Date of Birth: September 09, 1945           MRN: [id number]             PCP: Creola Corn, MD Referring: Creola Corn, MD Visit Date: 07/23/2023 Occupation: @GUAROCC @  Subjective:  Pain in both hands  History of Present Illness: Haley Wade is a 77 y.o. female with history of inflammatory arthritis, pseudogout and osteoarthritis.  She continues to have pain and discomfort in her bilateral hands.  She states the bilateral ring fingers are bothersome.  She is unable to tolerate colchicine due to loose stools.  She has been taking 1/4 tablet of colchicine which helps her to some extent.  She has some stiffness and discomfort in her knee joints but it does not interfere with her routine activities.  She denies any discomfort in her shoulders today.  She denies any discomfort in her neck and lower back.    Activities of Daily Living:  Patient reports morning stiffness for 5 minutes.   Patient Denies nocturnal pain.  Difficulty dressing/grooming: Denies Difficulty climbing stairs: Denies Difficulty getting out of chair: Denies Difficulty using hands for taps, buttons, cutlery, and/or writing: Denies  Review of Systems  Constitutional:  Negative for fatigue.  HENT:  Negative for mouth sores and mouth dryness.   Eyes:  Negative for dryness.  Respiratory:  Positive for shortness of breath.   Cardiovascular:  Positive for palpitations. Negative for chest pain.  Gastrointestinal:  Negative for blood in stool, constipation and diarrhea.  Endocrine: Negative for increased urination.  Genitourinary:  Negative for involuntary urination.  Musculoskeletal:  Positive for joint pain, joint pain, joint swelling and morning stiffness. Negative for gait problem, myalgias, muscle weakness, muscle tenderness and myalgias.  Skin:  Positive for hair loss. Negative for color change, rash and sensitivity to sunlight.  Allergic/Immunologic:  Negative for susceptible to infections.  Neurological:  Negative for dizziness and headaches.  Hematological:  Negative for swollen glands.  Psychiatric/Behavioral:  Negative for depressed mood and sleep disturbance. The patient is not nervous/anxious.     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   Inflammatory arthritis 11/16/2021   Complete tear of right rotator cuff    Biceps tendonitis on right    S/P rotator cuff repair 02/08/2021   Chest discomfort 01/26/2014   Palpitations 01/26/2014   Cellulitis 09/08/2013   Abdominal pain 02/26/2013   Hyponatremia 10/30/2012   COPD, questioned 01/02/2012   Allergic rhinitis due to pollen 10/20/2010   History of cardiovascular disorder 05/26/2009   Hearing loss 05/21/2009   RHINOSINUSITIS, 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    Past Medical History:  Diagnosis Date   Allergic rhinitis, cause unspecified    Anemia    Arthritis    Cellulitis    Chest discomfort    COPD, questioned    GAVE (gastric antral vascular ectasia)    Hyponatremia    MITRAL VALVE PROLAPSE, HX OF    Other chronic sinusitis    Palpitations    Syncope    Unspecified hearing loss     Family History  Problem Relation Age of Onset   Other Mother        c difficile  gastroenteritis   Lung cancer Father        smoker   Liver disease Brother    Liver cancer Brother    Asthma Other        grandmother   Other Other        bronchitis-grandmother   Past Surgical History:  Procedure Laterality Date   APPENDECTOMY     BACK SURGERY     BICEPT TENODESIS Right 02/08/2021   Procedure: BICEPS TENODESIS;  Surgeon: Cammy Copa, MD;  Location[facility] OR;  Service: Orthopedics;  Laterality: Right;   BLADDER REPAIR  02/2023   BLADDER SURGERY     BOWEL RESECTION N/A  11/01/2012   Procedure: SMALL BOWEL RESECTION;  Surgeon: [name], MD;  Location: [facility];  Service: General;  Laterality: N/A;   BREAST ENHANCEMENT SURGERY     revisions   BREAST SURGERY     CHOLECYSTECTOMY     COLON SURGERY     ESOPHAGOGASTRODUODENOSCOPY (EGD) WITH PROPOFOL N/A 10/14/2019   Procedure: ESOPHAGOGASTRODUODENOSCOPY (EGD) WITH PROPOFOL;  Surgeon: Vida Rigger, MD;  Location: [facility];  Service: Endoscopy;  Laterality: N/A;   EYE SURGERY     GI RADIOFREQUENCY ABLATION  10/14/2019   Procedure: GI RADIOFREQUENCY ABLATION;  Surgeon: Vida Rigger, MD;  Location: [facility];  Service: Endoscopy;;   LAPAROSCOPY N/A 11/01/2012   Procedure: LAPAROSCOPY DIAGNOSTIC;  Surgeon: [name], MD;  Location: [facility];  Service: General;  Laterality: N/A;   LAPAROTOMY N/A 11/01/2012   Procedure: EXPLORATORY LAPAROTOMY;  Surgeon: [name], MD;  Location: [facility];  Service: General;  Laterality: N/A;   LYSIS OF ADHESION N/A 11/01/2012   Procedure: LYSIS OF ADHESION;  Surgeon: [name], MD;  Location: [facility];  Service: General;  Laterality: N/A;   RHINOPLASTY     SHOULDER ARTHROSCOPY WITH OPEN ROTATOR CUFF REPAIR AND DISTAL CLAVICLE ACROMINECTOMY Right 02/08/2021   Procedure: SHOULDER ARTHROSCOPY WITH BICEPS TENDON RELEASE, MINI OPEN ROTATOR CUFF TEAR REPAIR OF THE INFRASPINATUS SUPRASPINATUS AND UPPER PORTIION OF THE SUBSCAP  WITH BICEPS TENODESIS;  Surgeon: Cammy Copa, MD;  Location: [facility] OR;  Service: Orthopedics;  Laterality: Right;   TONSILLECTOMY     TOTAL ABDOMINAL HYSTERECTOMY     Social History   Social History Narrative   Not on file   Immunization History  Administered Date(s) Administered   Influenza Split 06/26/2011, 06/25/2012, 06/28/2013   Pfizer(Comirnaty)Fall Seasonal Vaccine 12 years and older 08/18/2022   Respiratory Syncytial Virus Vaccine,Recomb Aduvanted(Arexvy) 05/11/2023   Tdap 09/08/2013, 04/13/2021     Objective: Vital Signs: BP 114/74  (BP Location: Right Arm, Patient Position: Sitting, Cuff Size: Normal)   Pulse 87   Resp 14   Ht 4' 11.5" (1.511 m)   Wt 98 lb (44.5 kg)   BMI 19.46 kg/m    Physical Exam Vitals and nursing note reviewed.  Constitutional:      Appearance: She is well-developed.  HENT:     Head: Normocephalic and atraumatic.  Eyes:     Conjunctiva/sclera: Conjunctivae normal.  Cardiovascular:     Rate and Rhythm: Normal rate and regular rhythm.     Heart sounds: Normal heart sounds.  Pulmonary:     Effort: Pulmonary effort is normal.     Breath sounds: Normal breath sounds.  Abdominal:     General: Bowel sounds are normal.     Palpations: Abdomen is soft.  Musculoskeletal:     Cervical back: Normal range of motion.  Lymphadenopathy:  Cervical: No cervical adenopathy.  Skin:    General: Skin is warm and dry.     Capillary Refill: Capillary refill takes less than 2 seconds.  Neurological:     Mental Status: She is alert and oriented to person, place, and time.  Psychiatric:        Behavior: Behavior normal.      Musculoskeletal Exam: She had limited lateral rotation of the cervical spine without discomfort.  Thoracolumbar scoliosis was noted.  No thoracic or lumbar spine tenderness was noted.  Shoulders, elbows, wrist joints with good range of motion with no synovitis.  She had bilateral second and fourth PIP and swelling.  PIP and DIP thickening was noted bilaterally.  Hip joints and knee joints with good range of motion.  There was no tenderness over ankles or MTPs.  CDAI Exam: CDAI Score: -- Patient Global: --; Provider Global: -- Swollen: --; Tender: -- Joint Exam 07/23/2023   No joint exam has been documented for this visit   There is currently no information documented on the homunculus. Go to the Rheumatology activity and complete the homunculus joint exam.  Investigation: No additional findings.  Imaging: US Guided Needle Placement  Result Date: 07/23/2023 Ultrasound  guided injection is preferred based studies that show increased duration, increased effect, greater accuracy, decreased procedural pain, increased response rate, and decreased cost with ultrasound guided versus blind injection.   Verbal informed consent obtained.  Time-out conducted.  Noted no overlying erythema, induration, or other signs of local infection. Ultrasound-guided PIP joint injection: After sterile prep with Betadine, injected 0.5 mL of 1% lidocaine and 10 mg Kenalog using a 27-gauge needle, in the PIP joint.     US Guided Needle Placement  Result Date: 07/23/2023 Ultrasound guided injection is preferred based studies that show increased duration, increased effect, greater accuracy, decreased procedural pain, increased response rate, and decreased cost with ultrasound guided versus blind injection.   Verbal informed consent obtained.  Time-out conducted.  Noted no overlying erythema, induration, or other signs of local infection. Ultrasound-guided PIP joint injection: After sterile prep with Betadine, injected 0.5 mL of 1% lidocaine and 10 mg Kenalog using a 27-gauge needle, in the PIP joint.      Recent Labs: Lab Results  Component Value Date   WBC 8.4 10/25/2022   HGB 13.2 10/25/2022   PLT 265 10/25/2022   NA 135 10/25/2022   K 4.9 10/25/2022   CL 100 10/25/2022   CO2 25 10/25/2022   GLUCOSE 87 10/25/2022   BUN 31 (H) 10/25/2022   CREATININE 0.59 (L) 10/25/2022   BILITOT 0.4 10/25/2022   ALKPHOS 35 (L) 11/17/2021   AST 16 10/25/2022   ALT 15 10/25/2022   PROT 7.2 10/25/2022   ALBUMIN 3.0 (L) 11/17/2021   CALCIUM 9.8 10/25/2022   GFRAA >90 09/09/2013    Speciality Comments: PLQ-Rash  Procedures:  Ultrasound guided injection is preferred based studies that show increased duration, increased effect, greater accuracy, decreased procedural pain, increased response rate, and decreased cost with ultrasound guided versus blind injection.   Verbal informed consent obtained.   Time-out conducted.  Noted no overlying erythema, induration, or other signs of local  infection. Ultrasound-guided PIP joint injection: After sterile prep with Betadine, injected 0.5 mL of 1% lidocaine and 10 mg Kenalog using a 27-gauge needle, in the PIP joint.      Small Joint Inj: bilateral ring PIP on 07/23/2023 10:34 AM Indications: pain and joint swelling Details: 27 G needle, ultrasound-guided dorsal approach  Spinal Needle: No  Medications (Right): 0.5 mL lidocaine 1 %; 10 mg triamcinolone acetonide 40 MG/ML Aspirate (Right): 0 mL Medications (Left): 0.5 mL lidocaine 1 %; 10 mg triamcinolone acetonide 40 MG/ML Aspirate (Left): 0 mL Outcome: tolerated well, no immediate complications Procedure, treatment alternatives, risks and benefits explained, specific risks discussed. Consent was given by the patient. Immediately prior to procedure a time out was called to verify the correct patient, procedure, equipment, support staff and site/side marked as required. Patient was prepped and draped in the usual sterile fashion.     Allergies: Plaquenil [hydroxychloroquine sulfate], Bee venom, Other, and Terconazole   Assessment / Plan:     Visit Diagnoses: Inflammatory arthritis - History of intermittent swelling in wrist joints, hands and knee joints.    Pseudogout - she had chondrocalcinosis  in the wrist joints and knee joints x-rays. colchicine 0.6 mg, one quarter to half tablet p.o. daily.  Patient notices improvement with colchicine.  Although she is not able to tolerate at higher doses due to diarrhea.  Labs obtained in March 2024 CBC and CMP were stable.  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  Right [facility] surgery by Dr. Mina Marble.  She had bilateral second and fourth PIP joint and swelling today.  Per patient's request bilateral fourth PIP joints were injected with lidocaine and Kenalog as described above.  Patient tolerated the  procedure well.  Postprocedure instructions were given.  Patient wants to come in at a later date for bilateral PIP joint cortisone injections.  Joint protection muscle strengthening was discussed.  S/P right rotator cuff repair- - By Dr. August Saucer.  Doing well.  Primary osteoarthritis of both knees -she has some discomfort climbing stairs but usually does well.  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  Chondrocalcinosis was noted in bilateral knee joints.  DDD (degenerative disc disease), cervical-she had limited range of motion of the cervical spine without discomfort.  Spondylosis of lumbar spine-she has not been pain in her lumbar spine.  She denies any discomfort today.  Osteopenia of multiple sites -patient gets DEXA scan by her PCP.  DEXA results are not available.  Other medical problems are listed as follows:  History of pericarditis - In 1990s with no recurrence.  Syncope, unspecified syncope type - X 2.  Patient had no recurrence since the last episode which was a year ago.  History of mitral valve prolapse  GAD (generalized anxiety disorder)  GAVE (gastric antral vascular ectasia)  Seasonal allergic rhinitis due to pollen  Orders: Orders Placed This Encounter  Procedures   Small Joint Inj   US Guided Needle Placement   US Guided Needle Placement   No orders of the defined types were placed in this encounter.  Face-to-face time spent patient was 30 minutes.  Greater than 50% time was spent in counseling and coordination of care.  Follow-Up Instructions: Return in about 6 months (around 01/21/2024) for Osteoarthritis.   Pollyann Savoy, MD  Note - This record has been created using Animal nutritionist.  Chart creation errors have been sought, but may not always  have been located. Such creation errors do not reflect on  the standard of medical care.

## 2023-07-17 DIAGNOSIS — N3946 Mixed incontinence: Secondary | ICD-10-CM | POA: Diagnosis not present

## 2023-07-19 ENCOUNTER — Ambulatory Visit: Payer: Medicare Other | Admitting: Rheumatology

## 2023-07-23 ENCOUNTER — Ambulatory Visit (INDEPENDENT_AMBULATORY_CARE_PROVIDER_SITE_OTHER): Payer: Medicare Other

## 2023-07-23 ENCOUNTER — Ambulatory Visit: Payer: Medicare Other | Attending: Rheumatology | Admitting: Rheumatology

## 2023-07-23 ENCOUNTER — Encounter: Payer: Self-pay | Admitting: Rheumatology

## 2023-07-23 ENCOUNTER — Ambulatory Visit: Payer: Medicare Other

## 2023-07-23 VITALS — BP 114/74 | HR 87 | Resp 14 | Ht 59.5 in | Wt 98.0 lb

## 2023-07-23 DIAGNOSIS — M47816 Spondylosis without myelopathy or radiculopathy, lumbar region: Secondary | ICD-10-CM

## 2023-07-23 DIAGNOSIS — M19042 Primary osteoarthritis, left hand: Secondary | ICD-10-CM | POA: Insufficient documentation

## 2023-07-23 DIAGNOSIS — F411 Generalized anxiety disorder: Secondary | ICD-10-CM | POA: Diagnosis not present

## 2023-07-23 DIAGNOSIS — M1990 Unspecified osteoarthritis, unspecified site: Secondary | ICD-10-CM

## 2023-07-23 DIAGNOSIS — M8589 Other specified disorders of bone density and structure, multiple sites: Secondary | ICD-10-CM

## 2023-07-23 DIAGNOSIS — M170 Bilateral primary osteoarthritis of knee: Secondary | ICD-10-CM | POA: Diagnosis not present

## 2023-07-23 DIAGNOSIS — M5030 Other cervical disc degeneration, unspecified cervical region: Secondary | ICD-10-CM | POA: Diagnosis not present

## 2023-07-23 DIAGNOSIS — M19041 Primary osteoarthritis, right hand: Secondary | ICD-10-CM

## 2023-07-23 DIAGNOSIS — K31819 Angiodysplasia of stomach and duodenum without bleeding: Secondary | ICD-10-CM | POA: Diagnosis not present

## 2023-07-23 DIAGNOSIS — Z8679 Personal history of other diseases of the circulatory system: Secondary | ICD-10-CM

## 2023-07-23 DIAGNOSIS — M1120 Other chondrocalcinosis, unspecified site: Secondary | ICD-10-CM | POA: Diagnosis not present

## 2023-07-23 DIAGNOSIS — Z79899 Other long term (current) drug therapy: Secondary | ICD-10-CM

## 2023-07-23 DIAGNOSIS — R55 Syncope and collapse: Secondary | ICD-10-CM | POA: Diagnosis not present

## 2023-07-23 DIAGNOSIS — J301 Allergic rhinitis due to pollen: Secondary | ICD-10-CM

## 2023-07-23 DIAGNOSIS — Z9889 Other specified postprocedural states: Secondary | ICD-10-CM | POA: Diagnosis not present

## 2023-07-23 MED ORDER — TRIAMCINOLONE ACETONIDE 40 MG/ML IJ SUSP
10.0000 mg | INTRAMUSCULAR | Status: AC | PRN
Start: 2023-07-23 — End: 2023-07-23
  Administered 2023-07-23: 10 mg via INTRA_ARTICULAR

## 2023-07-23 MED ORDER — LIDOCAINE HCL 1 % IJ SOLN
0.5000 mL | INTRAMUSCULAR | Status: AC | PRN
Start: 2023-07-23 — End: 2023-07-23
  Administered 2023-07-23: .5 mL

## 2023-08-10 ENCOUNTER — Other Ambulatory Visit: Payer: Self-pay | Admitting: *Deleted

## 2023-08-10 ENCOUNTER — Ambulatory Visit (HOSPITAL_COMMUNITY): Payer: Medicare Other | Attending: Cardiology

## 2023-08-10 DIAGNOSIS — R0609 Other forms of dyspnea: Secondary | ICD-10-CM | POA: Insufficient documentation

## 2023-08-10 DIAGNOSIS — I059 Rheumatic mitral valve disease, unspecified: Secondary | ICD-10-CM

## 2023-08-10 LAB — ECHOCARDIOGRAM COMPLETE
Calc EF: 58.8 %
MV M vel: 6.24 m/s
MV Peak grad: 155.8 mm[Hg]
Radius: 0.4 cm
S' Lateral: 2.14 cm
Single Plane A2C EF: 58.2 %
Single Plane A4C EF: 60 %

## 2023-08-21 DIAGNOSIS — R109 Unspecified abdominal pain: Secondary | ICD-10-CM | POA: Diagnosis not present

## 2023-08-21 DIAGNOSIS — K219 Gastro-esophageal reflux disease without esophagitis: Secondary | ICD-10-CM | POA: Diagnosis not present

## 2023-08-21 DIAGNOSIS — K5900 Constipation, unspecified: Secondary | ICD-10-CM | POA: Diagnosis not present

## 2023-09-06 DIAGNOSIS — Z124 Encounter for screening for malignant neoplasm of cervix: Secondary | ICD-10-CM | POA: Diagnosis not present

## 2023-09-06 DIAGNOSIS — Z01411 Encounter for gynecological examination (general) (routine) with abnormal findings: Secondary | ICD-10-CM | POA: Diagnosis not present

## 2023-09-06 DIAGNOSIS — Z1331 Encounter for screening for depression: Secondary | ICD-10-CM | POA: Diagnosis not present

## 2023-09-07 ENCOUNTER — Other Ambulatory Visit (HOSPITAL_BASED_OUTPATIENT_CLINIC_OR_DEPARTMENT_OTHER): Payer: Self-pay

## 2023-09-07 MED ORDER — COMIRNATY 30 MCG/0.3ML IM SUSY
PREFILLED_SYRINGE | INTRAMUSCULAR | 0 refills | Status: DC
  Filled 2023-09-07: qty 0.3, 1d supply, fill #0

## 2023-09-19 DIAGNOSIS — H52203 Unspecified astigmatism, bilateral: Secondary | ICD-10-CM | POA: Diagnosis not present

## 2023-09-19 DIAGNOSIS — Z961 Presence of intraocular lens: Secondary | ICD-10-CM | POA: Diagnosis not present

## 2023-09-19 DIAGNOSIS — H5711 Ocular pain, right eye: Secondary | ICD-10-CM | POA: Diagnosis not present

## 2023-09-25 ENCOUNTER — Other Ambulatory Visit: Payer: Medicare Other | Admitting: Rheumatology

## 2023-10-09 DIAGNOSIS — R519 Headache, unspecified: Secondary | ICD-10-CM | POA: Diagnosis not present

## 2023-10-09 DIAGNOSIS — K31819 Angiodysplasia of stomach and duodenum without bleeding: Secondary | ICD-10-CM | POA: Diagnosis not present

## 2023-10-09 DIAGNOSIS — R413 Other amnesia: Secondary | ICD-10-CM | POA: Diagnosis not present

## 2023-10-09 DIAGNOSIS — K219 Gastro-esophageal reflux disease without esophagitis: Secondary | ICD-10-CM | POA: Diagnosis not present

## 2023-10-09 DIAGNOSIS — K589 Irritable bowel syndrome without diarrhea: Secondary | ICD-10-CM | POA: Diagnosis not present

## 2023-10-09 DIAGNOSIS — K5900 Constipation, unspecified: Secondary | ICD-10-CM | POA: Diagnosis not present

## 2023-10-09 DIAGNOSIS — R140 Abdominal distension (gaseous): Secondary | ICD-10-CM | POA: Diagnosis not present

## 2023-10-23 ENCOUNTER — Ambulatory Visit

## 2023-10-23 ENCOUNTER — Ambulatory Visit: Payer: Medicare Other | Attending: Rheumatology | Admitting: Rheumatology

## 2023-10-23 ENCOUNTER — Ambulatory Visit (INDEPENDENT_AMBULATORY_CARE_PROVIDER_SITE_OTHER)

## 2023-10-23 DIAGNOSIS — M19041 Primary osteoarthritis, right hand: Secondary | ICD-10-CM

## 2023-10-23 DIAGNOSIS — M79641 Pain in right hand: Secondary | ICD-10-CM | POA: Insufficient documentation

## 2023-10-23 DIAGNOSIS — M19042 Primary osteoarthritis, left hand: Secondary | ICD-10-CM | POA: Insufficient documentation

## 2023-10-23 DIAGNOSIS — M79642 Pain in left hand: Secondary | ICD-10-CM | POA: Diagnosis not present

## 2023-10-23 MED ORDER — TRIAMCINOLONE ACETONIDE 40 MG/ML IJ SUSP
10.0000 mg | INTRAMUSCULAR | Status: AC | PRN
Start: 2023-10-23 — End: 2023-10-23
  Administered 2023-10-23: 10 mg via INTRA_ARTICULAR

## 2023-10-23 MED ORDER — LIDOCAINE HCL 1 % IJ SOLN
0.5000 mL | INTRAMUSCULAR | Status: AC | PRN
Start: 2023-10-23 — End: 2023-10-23
  Administered 2023-10-23: .5 mL

## 2023-10-23 NOTE — Progress Notes (Signed)
   Procedure Note  Patient: Haley Wade             Date of Birth: Sep 27, 1945           MRN: [id number]             Visit Date: 10/23/2023  Procedures: Visit Diagnoses:  1. Pain in both hands   2. Primary osteoarthritis of both hands   Patient had good results to bilateral ring finger PIP joint injections in December.  She came today to get her injections and other PIP joints.  Indications and side effects were discussed at length including the risk of infection, tendon injury, joint injury, and dermal atrophy and discoloration.  Patient wanted to proceed with the injection.  Ultrasound guided injection is preferred based studies that show increased duration, increased effect, greater accuracy, decreased procedural pain, increased response rate, and decreased cost with ultrasound guided versus blind injection. Verbal informed consent obtained.  Time-out conducted. Noted no overlying erythema, induration, or other signs of local infection. Ultrasound-guided PIP joint injection: After sterile prep with Betadine, injected 0.5 mL of 1% lidocaine and 10 mg Kenalog using a 27-gauge needle, and the PIP joint.     Small Joint Inj: R small PIP on 10/23/2023 12:28 PM Indications: pain and joint swelling Details: 27 G needle, ultrasound-guided dorsal approach  Spinal Needle: No  Medications: 0.5 mL lidocaine 1 %; 10 mg triamcinolone acetonide 40 MG/ML Aspirate: 0 mL Outcome: tolerated well, no immediate complications Procedure, treatment alternatives, risks and benefits explained, specific risks discussed. Consent was given by the patient. Immediately prior to procedure a time out was called to verify the correct patient, procedure, equipment, support staff and site/side marked as required. Patient was prepped and draped in the usual sterile fashion.    Small Joint Inj: L index PIP on 10/23/2023 12:29 PM Indications: pain and joint swelling Details: 27 G needle, ultrasound-guided dorsal  approach  Spinal Needle: No  Medications: 0.5 mL lidocaine 1 %; 10 mg triamcinolone acetonide 40 MG/ML Aspirate: 0 mL Outcome: tolerated well, no immediate complications Procedure, treatment alternatives, risks and benefits explained, specific risks discussed. Consent was given by the patient. Immediately prior to procedure a time out was called to verify the correct patient, procedure, equipment, support staff and site/side marked as required. Patient was prepped and draped in the usual sterile fashion.     Patient tolerated the procedure well.  Postprocedure instructions were given.Pollyann Savoy, MD

## 2023-10-24 ENCOUNTER — Other Ambulatory Visit (INDEPENDENT_AMBULATORY_CARE_PROVIDER_SITE_OTHER): Payer: Self-pay

## 2023-10-24 ENCOUNTER — Ambulatory Visit: Admitting: Orthopedic Surgery

## 2023-10-24 DIAGNOSIS — M25562 Pain in left knee: Secondary | ICD-10-CM

## 2023-10-24 DIAGNOSIS — M25561 Pain in right knee: Secondary | ICD-10-CM

## 2023-10-24 DIAGNOSIS — M1712 Unilateral primary osteoarthritis, left knee: Secondary | ICD-10-CM | POA: Diagnosis not present

## 2023-10-26 ENCOUNTER — Encounter: Payer: Self-pay | Admitting: Orthopedic Surgery

## 2023-10-26 MED ORDER — BUPIVACAINE HCL 0.25 % IJ SOLN
4.0000 mL | INTRAMUSCULAR | Status: AC | PRN
Start: 2023-10-24 — End: 2023-10-24
  Administered 2023-10-24: 4 mL via INTRA_ARTICULAR

## 2023-10-26 MED ORDER — LIDOCAINE HCL 1 % IJ SOLN
5.0000 mL | INTRAMUSCULAR | Status: AC | PRN
Start: 2023-10-24 — End: 2023-10-24
  Administered 2023-10-24: 5 mL

## 2023-10-26 MED ORDER — METHYLPREDNISOLONE ACETATE 40 MG/ML IJ SUSP
40.0000 mg | INTRAMUSCULAR | Status: AC | PRN
Start: 2023-10-24 — End: 2023-10-24
  Administered 2023-10-24: 40 mg via INTRA_ARTICULAR

## 2023-10-26 NOTE — Progress Notes (Signed)
 Office Visit Note   Patient: Haley Wade           Date of Birth: 1946/06/11           MRN: [id number] Visit Date: 10/24/2023 Requested by: Creola Corn, MD [address] [place],  [place] [address] PCP: Creola Corn, MD  Subjective: Chief Complaint  Patient presents with   Other    Bilateral knee pain L>R.  Patient complains of worsening pain over the last few weeks. At times she ambulates with limp. Reports of lateral pain and tightness. Tries to walk 3-5 mi/day 3-5x/wk but recently has been a challenge for her. Wants to discuss options for bilateral knee pain.  Reports bilateral knee stiffness.    HPI: Chrystian Ressler is a 78 y.o. female who presents to the office reporting left greater than right knee pain.  Hard for her to straighten the left leg.  She was able to walk 6 miles on Saturday and does do water aerobics.  Has a history of reaction to gel injections.  Has had months of symptoms..                ROS: All systems reviewed are negative as they relate to the chief complaint within the history of present illness.  Patient denies fevers or chills.  Assessment & Plan: Visit Diagnoses:  1. Pain in both knees, unspecified chronicity     Plan: Impression is exacerbation of left knee arthritis with differential diagnosis including stress reaction.  Cortisone injection performed today.  She will let us know in 1 to 2 weeks how she is doing and we could consider MRI scanning to rule out stress reaction/stress fracture as the neck step if this intervention does not help.  Follow-Up Instructions: No follow-ups on file.   Orders:  Orders Placed This Encounter  Procedures   XR KNEE 3 VIEW LEFT   XR KNEE 3 VIEW RIGHT   No orders of the defined types were placed in this encounter.     Procedures: Large Joint Inj: L knee on 10/24/2023 9:11 PM Indications: diagnostic evaluation, joint swelling and pain Details: 18 G 1.5 in needle, superolateral  approach  Arthrogram: No  Medications: 5 mL lidocaine 1 %; 40 mg methylPREDNISolone acetate 40 MG/ML; 4 mL bupivacaine 0.25 % Outcome: tolerated well, no immediate complications Procedure, treatment alternatives, risks and benefits explained, specific risks discussed. Consent was given by the patient. Immediately prior to procedure a time out was called to verify the correct patient, procedure, equipment, support staff and site/side marked as required. Patient was prepped and draped in the usual sterile fashion.       Clinical Data: No additional findings.  Objective: Vital Signs: There were no vitals taken for this visit.  Physical Exam:  Constitutional: Patient appears well-developed HEENT:  Head: Normocephalic Eyes:EOM are normal Neck: Normal range of motion Cardiovascular: Normal rate Pulmonary/chest: Effort normal Neurologic: Patient is alert Skin: Skin is warm Psychiatric: Patient has normal mood and affect  Ortho Exam: Ortho exam demonstrates full range of motion both knees.  No effusion in either knee.  Does have some medial joint line tenderness on the left side.  No groin pain with internal/external Tatian of the leg.  No other masses lymphadenopathy or skin changes noted in that left knee region  Specialty Comments:  MRI LUMBAR SPINE WITHOUT CONTRAST   TECHNIQUE: Multiplanar, multisequence MR imaging of the lumbar spine was performed. No intravenous contrast was administered.   COMPARISON:  None Available.   FINDINGS: Segmentation:  Standard.   Alignment:  2 mm anterolisthesis of L3 on L4.   Vertebrae: No acute fracture, evidence of discitis, or aggressive bone lesion.   Conus medullaris and cauda equina: Conus extends to the L1 level. Conus and cauda equina appear normal.   Paraspinal and other soft tissues: No acute paraspinal abnormality. 2 cm right hepatic cyst again noted better seen on CT abdomen/pelvis dated 09/16/2021.   Disc levels:   Disc  spaces: Degenerative disease with disc height loss at L3-4 and more severe at L4-5 and L5-S1. Disc desiccation throughout the remainder of the lumbar spine.   T12-L1: No significant disc bulge. No neural foraminal stenosis. No central canal stenosis.   L1-L2: Minimal broad-based disc bulge. No foraminal or central canal stenosis.   L2-L3: Mild broad-based disc bulge. Mild bilateral facet arthropathy. Mild spinal stenosis. No foraminal or central stenosis.   L3-L4: Minimal broad-based disc bulge with a broad shallow left paracentral/foraminal disc protrusion. Mild bilateral facet arthropathy. Mild spinal stenosis. Bilateral lateral recess narrowing.   L4-L5: Broad-based disc bulge. No foraminal or central canal stenosis.   L5-S1: Mild broad-based disc bulge. Mild bilateral foraminal stenosis. No spinal stenosis.   IMPRESSION: 1. Lumbar spine spondylosis as described above. 2.  No acute osseous injury of the lumbar spine.     Electronically Signed   By: Elige Ko M.D.   On: 04/24/2022 09:21  Imaging: No results found.   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   Inflammatory arthritis 11/16/2021   Complete tear of right rotator cuff    Biceps tendonitis on right    S/P rotator cuff repair 02/08/2021   Chest discomfort 01/26/2014   Palpitations 01/26/2014   Cellulitis 09/08/2013   Abdominal pain 02/26/2013   Hyponatremia 10/30/2012   COPD, questioned 01/02/2012   Allergic rhinitis due to pollen 10/20/2010   History of cardiovascular disorder 05/26/2009   Hearing loss 05/21/2009   RHINOSINUSITIS, 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   Past Medical History:  Diagnosis Date   Allergic rhinitis, cause unspecified    Anemia    Arthritis    Cellulitis    Chest  discomfort    COPD, questioned    GAVE (gastric antral vascular ectasia)    Hyponatremia    MITRAL VALVE PROLAPSE, HX OF    Other chronic sinusitis    Palpitations    Syncope    Unspecified hearing loss     Family History  Problem Relation Age of Onset   Other Mother        c difficile gastroenteritis   Lung cancer Father        smoker   Liver disease Brother    Liver cancer Brother    Asthma Other        grandmother   Other Other        bronchitis-grandmother    Past Surgical History:  Procedure Laterality Date   APPENDECTOMY     BACK SURGERY     BICEPT TENODESIS Right 02/08/2021   Procedure: BICEPS TENODESIS;  Surgeon: Cammy Copa, MD;  Location: [facility] OR;  Service: Orthopedics;  Laterality: Right;   BLADDER REPAIR  02/2023   BLADDER SURGERY     BOWEL RESECTION N/A 11/01/2012   Procedure: SMALL BOWEL RESECTION;  Surgeon: Tawanna Cooler  Molly Maduro, MD;  Location: [facility] ORS;  Service: General;  Laterality: N/A;   BREAST ENHANCEMENT SURGERY     revisions   BREAST SURGERY     CHOLECYSTECTOMY     COLON SURGERY     ESOPHAGOGASTRODUODENOSCOPY (EGD) WITH PROPOFOL N/A 10/14/2019   Procedure: ESOPHAGOGASTRODUODENOSCOPY (EGD) WITH PROPOFOL;  Surgeon: Vida Rigger, MD;  Location: [facility] ENDOSCOPY;  Service: Endoscopy;  Laterality: N/A;   EYE SURGERY     GI RADIOFREQUENCY ABLATION  10/14/2019   Procedure: GI RADIOFREQUENCY ABLATION;  Surgeon: Vida Rigger, MD;  Location: [facility] ENDOSCOPY;  Service: Endoscopy;;   LAPAROSCOPY N/A 11/01/2012   Procedure: LAPAROSCOPY DIAGNOSTIC;  Surgeon: [name], MD;  Location: [facility] ORS;  Service: General;  Laterality: N/A;   LAPAROTOMY N/A 11/01/2012   Procedure: EXPLORATORY LAPAROTOMY;  Surgeon: [name], MD;  Location: [facility] OR[facility];  Service: General;  Laterality: N/A;   LYSIS OF ADHESION N/A 11/01/2012   Procedure: LYSIS OF ADHESION;  Surgeon: [name], MD;  Location: [facility] ORS;  Service: General;  Laterality: N/A;   RHINOPLASTY     SHOULDER ARTHROSCOPY WITH  OPEN ROTATOR CUFF REPAIR AND DISTAL CLAVICLE ACROMINECTOMY Right 02/08/2021   Procedure: SHOULDER ARTHROSCOPY WITH BICEPS TENDON RELEASE, MINI OPEN ROTATOR CUFF TEAR REPAIR OF THE INFRASPINATUS SUPRASPINATUS AND UPPER PORTIION OF THE SUBSCAP  WITH BICEPS TENODESIS;  Surgeon: Cammy Copa, MD;  Location: [facility] OR;  Service: Orthopedics;  Laterality: Right;   TONSILLECTOMY     TOTAL ABDOMINAL HYSTERECTOMY     Social History   Occupational History   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

## 2023-10-31 DIAGNOSIS — R519 Headache, unspecified: Secondary | ICD-10-CM | POA: Diagnosis not present

## 2023-10-31 DIAGNOSIS — J342 Deviated nasal septum: Secondary | ICD-10-CM | POA: Diagnosis not present

## 2023-11-05 ENCOUNTER — Telehealth: Payer: Self-pay | Admitting: Cardiology

## 2023-11-05 NOTE — Telephone Encounter (Signed)
 Left message for patient to call back

## 2023-11-05 NOTE — Telephone Encounter (Signed)
 Patient called again to follow-up on her afib symptoms.  Patient is concerned as she wants to know if it will be safe for her to travel to [place] on Wednesday (3/26).

## 2023-11-05 NOTE — Telephone Encounter (Signed)
 Spoke with patient who is concerned because on Saturday, while attending a funeral she had a episode of fast heart rate.  According to her Apple watch the highest rate was 122 bpm.  She reports it lasted about 45 mins and then returned to her normal hear rate between 80-90.  She also reports an intense  pain in her right temple and right eye socket pain.  She does not report any other symptoms with the rapid heart rate.  These s/s resolved on their own and have not returned.  She reports being concerned she had At Fib though she has no history - only palpitations.  She is supposed to fly to [place] on Wednesday and want to know if it is safe.  Advised pt I will send this information to Dr Anne Fu for his review and she will be notified of safety of travel and any new orders.  Advised he may want her to wear a heart monitor but since this has only occurred once, it may not happen again and if not, we would not catch whatever occurred on Saturday.  Advised if s/s occur again to report to closest ED for further assessment and treatment.  She was appreciative of the call and information.

## 2023-11-05 NOTE — Telephone Encounter (Signed)
 Patient c/o Palpitations:  STAT if patient reporting lightheadedness, shortness of breath, or chest pain  How long have you had palpitations/irregular HR/ Afib? Are you having the symptoms now? This past Saturday 3/22  Are you currently experiencing lightheadedness, SOB or CP? No  Do you have a history of afib (atrial fibrillation) or irregular heart rhythm? Irregular heart rhythm  Have you checked your BP or HR? (document readings if available): no  Are you experiencing any other symptoms? Lightheadedness, right side pain in temple area

## 2023-11-05 NOTE — Telephone Encounter (Signed)
 Pt returning call to a nurse

## 2023-11-05 NOTE — Telephone Encounter (Signed)
 Follow Up:       Patient is calling back from this morning.

## 2023-11-07 ENCOUNTER — Telehealth: Payer: Self-pay | Admitting: Cardiology

## 2023-11-07 NOTE — Telephone Encounter (Signed)
 Haley Bathe, MD  You20 hours ago (1:11 PM)    Thank for the update.  She is okay to fly.  The nice thing about her watch is that if atrial fibrillation were present, it would notify as an arrhythmia/atrial fibrillation.  Her heart rate merely increased to 122 bpm or sinus tachycardia.  I feel comfortable with her going on her travels.    Pt is aware of the above information.

## 2023-11-07 NOTE — Telephone Encounter (Signed)
 Patient would like to switch to Dr. Cristal Deer, states that [facility] is closer for her. Please advise

## 2023-11-08 DIAGNOSIS — R42 Dizziness and giddiness: Secondary | ICD-10-CM | POA: Diagnosis not present

## 2023-11-08 DIAGNOSIS — H9202 Otalgia, left ear: Secondary | ICD-10-CM | POA: Diagnosis not present

## 2023-11-08 DIAGNOSIS — R519 Headache, unspecified: Secondary | ICD-10-CM | POA: Diagnosis not present

## 2023-11-19 ENCOUNTER — Encounter (HOSPITAL_BASED_OUTPATIENT_CLINIC_OR_DEPARTMENT_OTHER): Payer: Self-pay | Admitting: Family

## 2023-11-19 ENCOUNTER — Ambulatory Visit (HOSPITAL_BASED_OUTPATIENT_CLINIC_OR_DEPARTMENT_OTHER): Admitting: Family

## 2023-11-19 VITALS — BP 112/60 | HR 71 | Ht 59.5 in | Wt 99.0 lb

## 2023-11-19 DIAGNOSIS — R002 Palpitations: Secondary | ICD-10-CM | POA: Diagnosis not present

## 2023-11-19 DIAGNOSIS — I340 Nonrheumatic mitral (valve) insufficiency: Secondary | ICD-10-CM | POA: Diagnosis not present

## 2023-11-19 MED ORDER — METOPROLOL TARTRATE 25 MG PO TABS: ORAL_TABLET | ORAL | 2 refills | Status: DC

## 2023-11-19 NOTE — Patient Instructions (Addendum)
 Medication Instructions:   Start Metoprolol tartrate 12.5mg -25mg  (half tablet to whole tablet) as needed for palpitations  *If you need a refill on your cardiac medications before your next appointment, please call your pharmacy*  Lab Work:  Would recommend checking magnesium, potassium, and thyroid at your upcoming visit with Dr. Timothy Lasso.   If you have labs (blood work) drawn today and your tests are completely normal, you will receive your results only by: MyChart Message (if you have MyChart) OR A paper copy in the mail If you have any lab test that is abnormal or we need to change your treatment, we will call you to review the results.  Testing/Procedures: Your EKG today shows normal sinus rhythm which is a good result!  Follow-Up: At [facility], you and your health needs are our priority.  As part of our continuing mission to provide you with exceptional heart care, our providers are all part of one team.  This team includes your primary Cardiologist (physician) and Advanced Practice Providers or APPs (Physician Assistants and Nurse Practitioners) who all work together to provide you with the care you need, when you need it.  Your next appointment:   4-6 months  Provider:   Jodelle Red, MD    We recommend signing up for the patient portal called "MyChart".  Sign up information is provided on this After Visit Summary.  MyChart is used to connect with patients for Virtual Visits (Telemedicine).  Patients are able to view lab/test results, encounter notes, upcoming appointments, etc.  Non-urgent messages can be sent to your provider as well.   To learn more about what you can do with MyChart, go to [email or web address].   Other Instructions  Recommend trying Biotene dry mouth rinse in the evening  To prevent palpitations: Make sure you are adequately hydrated.  Avoid and/or limit caffeine containing beverages like soda or tea. Exercise regularly.  Manage  stress well. Some over the counter medications can cause palpitations such as Benadryl, AdvilPM, TylenolPM. Regular Advil or Tylenol do not cause palpitations.

## 2023-11-19 NOTE — Progress Notes (Signed)
 Cardiology Office Note:  .   Date:  11/19/2023  ID:  Haley Wade, DOB May 01, 1946, MRN [id number] PCP: Creola Corn, MD  [facility] Providers Cardiologist:  Donato Schultz, MD    History of Present Illness: .   Haley Wade is a 78 y.o. female with hx of SVT, incomplete RBBB, GERD, MR. Family history notable for heart disease, GAVE (gastric antral vascular ectasia).   Monitor 2021 with isolated PVC correlating with complaints of flutter. Prior myoview 2022 low risk with LVEF 77%. Previously on low dose Toprol XL but discontinued due to adverse effects particularly at higher doses including stomach upset and bloating.   Last seen by Dr. Anne Fu 07/09/23 noting a clenching sensation in her chest. Subsequent echo 08/10/23 normal LVEF 60-65%, gr1dd, RV normal, moderate MR, mild holosystolic prolapse of middle scallop of posterior leaflet of MV. . Plan to consider low dose diltiazem vs verapamil if symptoms progressed.   Presents today for follow up. Pleasant lady who resides at [employer]. She contacted the office a couple weeks ago noting 45 minute episode of fast heart rate up to 122 bpm while attending a funeral. When asked about the episode she tells me while sitting at the funeral her heart "just took off". Was a funeral for someone she did not know well so did not feel she was significantly emotional or stressed. Her heart rate with her Apple Watch was up to 122 bpm with elevated heart rate lasting 45 minutes. She had walked that morning, but had breakfast and lunch prior to the 2pm funeral. She felt a little tired the next day. No recurrent palpitations since that time. About a month ago while standing in the dining room felt lightheaded as if she might pass out. Notes this has happened previously. Reviewed recent reassuring echocardiogram.   BP often relatively hypotensive. She takes a half tablet of Minoxidil 2.5mg  every evening for hair loss. She takes Sprionolactone  50mg  three times daily predominantly for hair loss. Discussed this could lead to propensity towards dehydration. She drinks one cup of coffee in the morning then during the day light cranberry juice, light lemonade, water and occasional glass of wine in the evening. She eats egg whites and toast for breakfast with [place] bacon, lunch salad or soup with a half sandwich, and dinner.   Discussed PRN beta blocker to have on hand in case of episode of palpitations during her upcoming trip to [place]  ROS: Please see the history of present illness.    All other systems reviewed and are negative.   Studies Reviewed: .        Cardiac Studies & Procedures   ______________________________________________________________________________________________   STRESS TESTS  MYOCARDIAL PERFUSION IMAGING 10/27/2020  Narrative  Nuclear stress EF: 77%.  There was no ST segment deviation noted during stress.  No T wave inversion was noted during stress.  Normal perfusion with no evidence of ischemia or infarction.  The study is normal.  This is a low risk study.  The left ventricular ejection fraction is hyperdynamic (>65%).  Laurance Flatten, MD   ECHOCARDIOGRAM  ECHOCARDIOGRAM COMPLETE 08/10/2023  Narrative ECHOCARDIOGRAM REPORT    Patient Name:   Haley Wade Date of Exam: 08/10/2023 Medical Rec #:  [id number]               Height:       59.5 in Accession #:    [id number]  Weight:       98.0 lb Date of Birth:  06-01-1946              BSA:          1.370 m Patient Age:    77 years                BP:           114/74 mmHg Patient Gender: F                       HR:           74 bpm. Exam Location:  [address]  Procedure: 2D Echo, Cardiac Doppler, Color Doppler and Strain Analysis  Indications:    Dyspnea R06.00  History:        Patient has prior history of Echocardiogram examinations, most recent 01/20/2014. Mitral Valve Prolapse;  Signs/Symptoms:Dyspnea.  Sonographer:    Eulah Pont RDCS Referring Phys: [phone] MARK C SKAINS   Sonographer Comments: Global longitudinal strain was attempted. IMPRESSIONS   1. Left ventricular ejection fraction, by estimation, is 60 to 65%. The left ventricle has normal function. The left ventricle has no regional wall motion abnormalities. Left ventricular diastolic parameters are consistent with Grade I diastolic dysfunction (impaired relaxation). The average left ventricular global longitudinal strain is -16.6 %. The global longitudinal strain is abnormal. 2. Right ventricular systolic function is normal. The right ventricular size is normal. There is normal pulmonary artery systolic pressure. 3. The mitral valve is myxomatous. Moderate mitral valve regurgitation. No evidence of mitral stenosis. There is mild holosystolic prolapse of the middle scallop of the posterior leaflet of the mitral valve. 4. The aortic valve is tricuspid. Aortic valve regurgitation is mild. No aortic stenosis is present. 5. The inferior vena cava is normal in size with greater than 50% respiratory variability, suggesting right atrial pressure of 3 mmHg.  FINDINGS Left Ventricle: Left ventricular ejection fraction, by estimation, is 60 to 65%. The left ventricle has normal function. The left ventricle has no regional wall motion abnormalities. The average left ventricular global longitudinal strain is -16.6 %. The global longitudinal strain is abnormal. The left ventricular internal cavity size was normal in size. There is no left ventricular hypertrophy. Left ventricular diastolic parameters are consistent with Grade I diastolic dysfunction (impaired relaxation).  Right Ventricle: The right ventricular size is normal. No increase in right ventricular wall thickness. Right ventricular systolic function is normal. There is normal pulmonary artery systolic pressure. The tricuspid regurgitant velocity is 2.28 m/s,  and with an assumed right atrial pressure of 3 mmHg, the estimated right ventricular systolic pressure is 23.8 mmHg.  Left Atrium: Left atrial size was normal in size.  Right Atrium: Right atrial size was normal in size.  Pericardium: There is no evidence of pericardial effusion.  Mitral Valve: The mitral valve is myxomatous. There is mild holosystolic prolapse of the middle scallop of the posterior leaflet of the mitral valve. Moderate mitral valve regurgitation. No evidence of mitral valve stenosis.  Tricuspid Valve: The tricuspid valve is normal in structure. Tricuspid valve regurgitation is mild . No evidence of tricuspid stenosis.  Aortic Valve: The aortic valve is tricuspid. Aortic valve regurgitation is mild. No aortic stenosis is present.  Pulmonic Valve: The pulmonic valve was normal in structure. Pulmonic valve regurgitation is trivial. No evidence of pulmonic stenosis.  Aorta: The aortic root is normal in size and structure.  Venous: The inferior vena cava  is normal in size with greater than 50% respiratory variability, suggesting right atrial pressure of 3 mmHg.  IAS/Shunts: No atrial level shunt detected by color flow Doppler.   LEFT VENTRICLE PLAX 2D LVIDd:         3.27 cm     Diastology LVIDs:         2.14 cm     LV e' medial:    5.84 cm/s LV PW:         0.87 cm     LV E/e' medial:  15.5 LV IVS:        0.88 cm     LV e' lateral:   9.24 cm/s LVOT diam:     1.70 cm     LV E/e' lateral: 9.8 LV SV:         45 LV SV Index:   33          2D Longitudinal Strain LVOT Area:     2.27 cm    2D Strain GLS Avg:     -16.6 %  LV Volumes (MOD) LV vol d, MOD A2C: 69.2 ml LV vol d, MOD A4C: 60.2 ml LV vol s, MOD A2C: 28.9 ml LV vol s, MOD A4C: 24.1 ml LV SV MOD A2C:     40.3 ml LV SV MOD A4C:     60.2 ml LV SV MOD BP:      38.0 ml  RIGHT VENTRICLE RV S prime:     10.00 cm/s TAPSE (M-mode): 1.7 cm  LEFT ATRIUM             Index        RIGHT ATRIUM          Index LA diam:         2.80 cm 2.04 cm/m   RA Area:     9.44 cm LA Vol (A2C):   25.3 ml 18.47 ml/m  RA Volume:   18.30 ml 13.36 ml/m LA Vol (A4C):   17.2 ml 12.56 ml/m LA Biplane Vol: 21.0 ml 15.33 ml/m AORTIC VALVE LVOT Vmax:   103.00 cm/s LVOT Vmean:  68.600 cm/s LVOT VTI:    0.200 m  AORTA Ao Root diam: 3.00 cm Ao Asc diam:  3.10 cm  MR Peak grad:   155.8 mmHg   TRICUSPID VALVE MR Mean grad:   108.0 mmHg   TR Peak grad:   20.8 mmHg MR Vmax:        624.00 cm/s  TR Vmax:        228.00 cm/s MR Vmean:       498.5 cm/s MR PISA:        1.01 cm     SHUNTS MR PISA Radius: 0.40 cm      Systemic VTI:  0.20 m MV E velocity: 90.60 cm/s    Systemic Diam: 1.70 cm MV A velocity: 115.00 cm/s MV E/A ratio:  0.79  Donato Schultz MD Electronically signed by Donato Schultz MD Signature Date/Time: 08/10/2023/1:59:49 PM    Final    MONITORS  CARDIAC EVENT MONITOR 07/26/2020  Narrative  Normal sinus rhythm  Isolated PVC's that correlate with complaint of flutter.  Non-sustained 7 beat VT, asymptomatic  No sustained arrhythmia or pauses to explain syncope       ______________________________________________________________________________________________      Risk Assessment/Calculations:             Physical Exam:   VS:  BP 112/60 (BP Location: Left Arm, Patient Position: Sitting, Cuff  Size: Normal)   Pulse 71   Ht 4' 11.5" (1.511 m)   Wt 99 lb (44.9 kg)   SpO2 97%   BMI 19.66 kg/m    Wt Readings from Last 3 Encounters:  11/19/23 99 lb (44.9 kg)  07/23/23 98 lb (44.5 kg)  06/29/23 100 lb 3.2 oz (45.5 kg)    GEN: Well nourished, well developed in no acute distress NECK: No JVD; No carotid bruits CARDIAC: RRR, no murmurs, rubs, gallops RESPIRATORY:  Clear to auscultation without rales, wheezing or rhonchi  ABDOMEN: Soft, non-tender, non-distended EXTREMITIES:  No edema; No deformity   ASSESSMENT AND PLAN: .    Palpitations / SVT - Episode of palpitations with sensation of  heart racing 2 weeks ago lasting 45 minutes. EKG today NSR with no concerning features. No recurrence since episode. Consider etiology SVT. As infrequent, defer repeat monitor. Encouraged to take EKG on her Apple Watch if she notes palpitation. Has PCP visit upcoming, recommend TSH, BMET, mag. Rx Metoprolol tartrate 12.5-25mg  PRN for persistent palpitations. Anticipate some of her palpitations may be related to dehydration as she is taking Spironolactone 50mg  TID for hair loss, she is hesitant to decrease dose.   Moderate MR - By echo 08/10/23. Repeat echo in 1 year already ordered.   GERD - Managed by PCP team.        Dispo: follow up in 4-6 months with Dr. Cristal Deer  Signed, Alver Sorrow, NP

## 2023-11-20 DIAGNOSIS — R42 Dizziness and giddiness: Secondary | ICD-10-CM | POA: Diagnosis not present

## 2023-11-20 DIAGNOSIS — H9202 Otalgia, left ear: Secondary | ICD-10-CM | POA: Diagnosis not present

## 2023-11-21 DIAGNOSIS — G919 Hydrocephalus, unspecified: Secondary | ICD-10-CM

## 2023-11-21 DIAGNOSIS — R930 Abnormal findings on diagnostic imaging of skull and head, not elsewhere classified: Secondary | ICD-10-CM

## 2023-11-21 HISTORY — DX: Hydrocephalus, unspecified: G91.9

## 2023-11-21 HISTORY — DX: Abnormal findings on diagnostic imaging of skull and head, not elsewhere classified: R93.0

## 2023-11-22 DIAGNOSIS — R519 Headache, unspecified: Secondary | ICD-10-CM | POA: Diagnosis not present

## 2023-11-22 DIAGNOSIS — Z79899 Other long term (current) drug therapy: Secondary | ICD-10-CM | POA: Diagnosis not present

## 2023-11-22 DIAGNOSIS — Z974 Presence of external hearing-aid: Secondary | ICD-10-CM | POA: Diagnosis not present

## 2023-11-22 DIAGNOSIS — R2689 Other abnormalities of gait and mobility: Secondary | ICD-10-CM | POA: Diagnosis not present

## 2023-11-22 DIAGNOSIS — H903 Sensorineural hearing loss, bilateral: Secondary | ICD-10-CM | POA: Diagnosis not present

## 2023-11-22 DIAGNOSIS — R42 Dizziness and giddiness: Secondary | ICD-10-CM | POA: Diagnosis not present

## 2023-11-22 DIAGNOSIS — H9202 Otalgia, left ear: Secondary | ICD-10-CM | POA: Diagnosis not present

## 2023-11-29 ENCOUNTER — Encounter (HOSPITAL_BASED_OUTPATIENT_CLINIC_OR_DEPARTMENT_OTHER): Payer: Self-pay

## 2023-11-29 DIAGNOSIS — R202 Paresthesia of skin: Secondary | ICD-10-CM | POA: Diagnosis not present

## 2023-11-29 DIAGNOSIS — R002 Palpitations: Secondary | ICD-10-CM | POA: Diagnosis not present

## 2023-11-29 DIAGNOSIS — G500 Trigeminal neuralgia: Secondary | ICD-10-CM | POA: Diagnosis not present

## 2023-12-03 DIAGNOSIS — L089 Local infection of the skin and subcutaneous tissue, unspecified: Secondary | ICD-10-CM | POA: Diagnosis not present

## 2023-12-03 DIAGNOSIS — R208 Other disturbances of skin sensation: Secondary | ICD-10-CM | POA: Diagnosis not present

## 2023-12-03 DIAGNOSIS — L658 Other specified nonscarring hair loss: Secondary | ICD-10-CM | POA: Diagnosis not present

## 2023-12-03 DIAGNOSIS — L650 Telogen effluvium: Secondary | ICD-10-CM | POA: Diagnosis not present

## 2023-12-11 DIAGNOSIS — R002 Palpitations: Secondary | ICD-10-CM | POA: Diagnosis not present

## 2023-12-17 ENCOUNTER — Other Ambulatory Visit: Admitting: Rheumatology

## 2023-12-17 DIAGNOSIS — R202 Paresthesia of skin: Secondary | ICD-10-CM | POA: Diagnosis not present

## 2023-12-24 DIAGNOSIS — K118 Other diseases of salivary glands: Secondary | ICD-10-CM | POA: Diagnosis not present

## 2023-12-31 ENCOUNTER — Ambulatory Visit: Admitting: Psychiatry

## 2024-01-03 DIAGNOSIS — R002 Palpitations: Secondary | ICD-10-CM | POA: Diagnosis not present

## 2024-01-03 DIAGNOSIS — R7989 Other specified abnormal findings of blood chemistry: Secondary | ICD-10-CM | POA: Diagnosis not present

## 2024-01-03 DIAGNOSIS — R202 Paresthesia of skin: Secondary | ICD-10-CM | POA: Diagnosis not present

## 2024-01-03 DIAGNOSIS — Z0189 Encounter for other specified special examinations: Secondary | ICD-10-CM | POA: Diagnosis not present

## 2024-01-03 DIAGNOSIS — Z1212 Encounter for screening for malignant neoplasm of rectum: Secondary | ICD-10-CM | POA: Diagnosis not present

## 2024-01-03 DIAGNOSIS — Z130 Encounter for screening for diseases of the blood and blood-forming organs and certain disorders involving the immune mechanism: Secondary | ICD-10-CM | POA: Diagnosis not present

## 2024-01-03 DIAGNOSIS — E559 Vitamin D deficiency, unspecified: Secondary | ICD-10-CM | POA: Diagnosis not present

## 2024-01-03 DIAGNOSIS — G459 Transient cerebral ischemic attack, unspecified: Secondary | ICD-10-CM | POA: Diagnosis not present

## 2024-01-03 DIAGNOSIS — D649 Anemia, unspecified: Secondary | ICD-10-CM | POA: Diagnosis not present

## 2024-01-04 DIAGNOSIS — R002 Palpitations: Secondary | ICD-10-CM | POA: Diagnosis not present

## 2024-01-04 DIAGNOSIS — I4719 Other supraventricular tachycardia: Secondary | ICD-10-CM | POA: Diagnosis not present

## 2024-01-04 DIAGNOSIS — I491 Atrial premature depolarization: Secondary | ICD-10-CM | POA: Diagnosis not present

## 2024-01-04 DIAGNOSIS — I493 Ventricular premature depolarization: Secondary | ICD-10-CM | POA: Diagnosis not present

## 2024-01-08 ENCOUNTER — Telehealth (HOSPITAL_BASED_OUTPATIENT_CLINIC_OR_DEPARTMENT_OTHER): Payer: Self-pay | Admitting: Family

## 2024-01-08 NOTE — Telephone Encounter (Signed)
 Pt dropped off cardiac monitor results to review. Will be in providers box.

## 2024-01-08 NOTE — Telephone Encounter (Signed)
On your desk for review. 

## 2024-01-09 ENCOUNTER — Encounter: Payer: Self-pay | Admitting: Psychiatry

## 2024-01-09 ENCOUNTER — Ambulatory Visit: Admitting: Psychiatry

## 2024-01-09 DIAGNOSIS — F5105 Insomnia due to other mental disorder: Secondary | ICD-10-CM

## 2024-01-09 DIAGNOSIS — F411 Generalized anxiety disorder: Secondary | ICD-10-CM

## 2024-01-09 MED ORDER — ZALEPLON 10 MG PO CAPS
10.0000 mg | ORAL_CAPSULE | Freq: Every evening | ORAL | 0 refills | Status: AC | PRN
Start: 2024-01-09 — End: ?

## 2024-01-09 NOTE — Progress Notes (Addendum)
 Haley Wade [id number] 1945/08/30 78 y.o.  Subjective:   Patient ID:  Haley Wade is a 78 y.o. (DOB [birth year]) female.  Chief Complaint:  Chief Complaint  Patient presents with   Follow-up   Anxiety   Sleeping Problem    HPI Haley Wade presents to the office today for follow-up of anxiety and insomnia.  Challenging winter with health issues with GI problems.  Heart monitor and cardiac workup with palpitations.  Fall last week after starting morning walk and tripped.  Problems with EMA and doesn't want to get used to taking zaleplon  and about 3-4 times per week.  Has used imagery and other techniques to help sleep. 1 and 1/2 cup coffee am only. No hangover with zaleplon .  Weaned off lorazepam  HS and then only prn.  Last used 02/08/21. No panic lately. And anxiety managed. Patient reports stable mood and denies depressed or irritable moods.  Patient denies any recent difficulty with anxiety.  Denies appetite disturbance.  Patient reports that energy and motivation have been good.  Patient denies any difficulty with concentration.  Patient denies any suicidal ideation.  11/21/21 appt noted: Biggest issue is sleep.  Trazoadone SE constipation and hair loss.  Using zaleplon  intermittently doesn't work well. Underlying stress.  Getting married.  Not sure what to do about some stress over it.. SE Mirtazapine would never take it. Wants to take intermittent lorazepam . Pressure in her head with normal BP and on her scalp with stinging and itching or burning or pain behind right eye sometimes. Working 3 days weekly.  07/10/2022 appointment noted: Still on sonata  5 mg prn.  No SE.  No hangover. Most of the time sleeps well. Married and that makes sleep easier.    They moved into house together.  Happy . Plans to retire for the last time. Lives at [place] and loves it. Not much anxiety now.   Situation better has changed her life for the better  without depression or anxiety. Both strong Christians.  Devotions together.    04/11/23 appt noted: No sig anxiety.  Retired finally.  Remarried and a lot of joy from that.  Strong Lynder Sanger is blessing.  15 mos ago. Exercise together and trips .    Goes to a lot of events.   New set of friends at [employer].   No dep. No SE with Sonata .  Worries about hair loss.  01/09/24 appt noted: Med: zaleplon  10 hs prn, only psych med Has questions about HA px with neuro SE.  Tests done.  MRI brain and EEG.  Got scared of cognitive decline and yet passed cog testing at [facility]. EEG slowing over L temporal area of unclear significance FU neuro in June.   Anxiety over her health.  This has been a problem in the past.   Generally loves the zaleplon , but sometimes doesn't work.  Takes it 2-3 times per week latley with  anxiety over health. She and 78 yo H work out daily.  Great time together.     Past Psychiatric Medication Trials: Past Psychiatric History:  Long history of counseling Remote antidepressants without help and Seroquel NR & SE. On lorazepam  for years. SE sometimes hair loss.  But still the best. Alprazolam short duration.  Klonopin SE. SE Mirtazapine would never take it.   Flowsheet Row ED from 03/08/2023 in [facility] Emergency Department at [facility] ED from [facility]11/2022 in [facility] Department at [facility] ED from 03/09/2022 in [facility] Department at [facility] RISK CATEGORY No Risk No Risk No Risk     Review of Systems:  Review of Systems  Skin:        Hair loss  Neurological:  Positive for dizziness. Negative for tremors.  Psychiatric/Behavioral:  Positive for sleep disturbance. Negative for dysphoric mood. The patient is nervous/anxious.     Medications: I have reviewed the patient's current medications.  Current Outpatient Medications  Medication Sig Dispense Refill   acyclovir  (ZOVIRAX ) 200 MG capsule Take 200  mg by mouth daily.     clobetasol (TEMOVATE) 0.05 % external solution Apply topically.     colchicine  0.6 MG tablet Take 0.6 mg by mouth daily. Patient states she takes a fourth of a tablet.     conjugated estrogens  (PREMARIN ) vaginal cream Place vaginally.     COVID-19 mRNA vaccine, Pfizer, (COMIRNATY ) syringe Inject into the muscle. (Patient not taking: Reported on 01/22/2024) 0.3 mL 0   diclofenac Sodium (VOLTAREN) 1 % GEL Apply topically as needed.     estrogens , conjugated, (PREMARIN ) 0.625 MG tablet Take 0.625 mg by mouth daily.      FERREX 150 150 MG capsule Take 150 mg by mouth daily with breakfast.     finasteride  (PROSCAR ) 5 MG tablet Take 5 mg by mouth daily.     fluticasone  (FLONASE ) 50 MCG/ACT nasal spray Place 2 sprays into both nostrils daily as needed for allergies or rhinitis.     Hyoscyamine Sulfate SL 0.125 MG SUBL PLACE 1-2 TABLETS UNDER THE TONGUE AND ALLOW TO DISSOLVE AS NEEDED EVERY 6-8 HOURS     ibuprofen  (ADVIL ) 600 MG tablet Take 1 tablet (600 mg total) by mouth 3 (three) times daily. (Patient taking differently: Take 600 mg by mouth as needed.) 15 tablet 0   ketoconazole (NIZORAL) 2 % shampoo SMARTSIG:Topical 2-3 Times Weekly     Meth-Hyo-M Bl-Na Phos-Ph Sal (URIBEL) 118 MG CAPS Take by mouth as needed.     Methen-Bella-Meth Bl-Phen Sal (URISED PO) Take 1 tablet by mouth as needed (for bladder spasms).     metoprolol  tartrate (LOPRESSOR ) 25 MG tablet Take 12.5mg  to 25mg  (half tablet or whole tablet) as needed for persistent palpitations. 30 tablet 2   metroNIDAZOLE (METROGEL) 0.75 % vaginal gel Place vaginally. (Patient not taking: Reported on 01/22/2024)     nitrofurantoin, macrocrystal-monohydrate, (MACROBID) 100 MG capsule as needed.     nystatin-triamcinolone  (MYCOLOG II) cream as needed. (Patient not taking: Reported on 01/22/2024)     omeprazole  (PRILOSEC ) 20 MG capsule Take 20 mg by mouth as needed (for reflux).     spironolactone  (ALDACTONE ) 50 MG tablet Take 1  tablet (50 mg total) by mouth daily. (Patient taking differently: Take 50 mg by mouth 3 (three) times daily.)     Vitamin D, Ergocalciferol, (DRISDOL) 1.25 MG (50000 UNIT) CAPS capsule Take 50,000 Units by mouth every Saturday.     minoxidil (LONITEN) 2.5 MG tablet Take by mouth. 1/2 tab daily     zaleplon  (SONATA ) 10 MG capsule Take 1 capsule (10 mg total) by mouth at bedtime as needed. (Patient taking differently: Take 10 mg by mouth as needed.) 30 capsule 0   No current facility-administered medications for this visit.    Medication Side Effects: None  Allergies:  Allergies  Allergen Reactions   Plaquenil [Hydroxychloroquine Sulfate] Rash and Other (See Comments)    Covered all over in a rash and welts   Bee Venom Swelling and Other (See Comments)  Swelling at site where stung   Other     Scented lotions, soaps, shampoos/conditioners.    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   Please see review of systems for further details on the patient's review from today.   Objective:   Physical Exam:  There were no vitals taken for this visit.  Physical Exam Constitutional:      General: She is not in acute distress. Genitourinary:    Rectum: Normal.   Musculoskeletal:        General: No deformity.   Neurological:     Mental Status: She is alert and oriented to person, place, and time.     Coordination: Coordination  normal.   Psychiatric:        Attention and Perception: Attention and perception normal. She does not perceive auditory or visual hallucinations.        Mood and Affect: Mood is anxious. Mood is not depressed. Affect is not blunt, angry or inappropriate.        Speech: Speech normal. Speech is not slurred.        Behavior: Behavior normal.        Thought Content: Thought content normal. Thought content is not paranoid or delusional. Thought content does not include homicidal or suicidal ideation. Thought content does not include suicidal plan.        Cognition and Memory: Cognition and memory normal.        Judgment: Judgment normal.     Comments: Insight intact Anxiety flares with triggers with stress.     Lab Review:     Component Value Date/Time   NA 135 10/25/2022 0904   K 4.9 10/25/2022 0904   CL 100 10/25/2022 0904   CO2 25 10/25/2022 0904   GLUCOSE 87 10/25/2022 0904   BUN 31 (H) 10/25/2022 0904   CREATININE 0.59 (L) 10/25/2022 0904   CALCIUM 9.8 10/25/2022 0904   PROT 7.2 10/25/2022 0904   ALBUMIN  3.0 (L) 11/17/2021 0009   AST 16 10/25/2022 0904   ALT 15 10/25/2022 0904   ALKPHOS 35 (L) 11/17/2021 0009   BILITOT 0.4 10/25/2022 0904   GFRNONAA >60 01/23/2022 0735   GFRAA >90 09/09/2013 0617       Component Value Date/Time   WBC 8.4 10/25/2022 0904   RBC 4.69 10/25/2022 0904   HGB 13.2 10/25/2022 0904   HCT 41.1 10/25/2022 0904   PLT 265 10/25/2022 0904   MCV 87.6 10/25/2022 0904   MCH 28.1 10/25/2022 0904   MCHC 32.1 10/25/2022 0904   RDW 12.5 10/25/2022 0904   LYMPHSABS 1,798 10/25/2022 0904   MONOABS 0.7 01/23/2022 0735   EOSABS 84 10/25/2022 0904   BASOSABS 34 10/25/2022 0904    No results found for: POCLITH, LITHIUM   No results found for: PHENYTOIN, PHENOBARB, VALPROATE, CBMZ   .res  Assessment: Plan:    Haley Wade was seen today for follow-up, anxiety and sleeping problem.  Diagnoses and all orders for this visit:  Generalized  anxiety disorder  Insomnia due to mental condition -     zaleplon  (SONATA ) 10 MG capsule; Take 1 capsule (10 mg total) by mouth at bedtime as needed. (Patient taking differently: Take 10 mg by mouth as needed.)      very med sensitive.  Chronic somatic anxiety ongoing waxes and wanes.    Been able to wean off lorazepam  and anxiety controlled.    Reviewed results EEG, MRI and MRA head.  EEG will need to be discussed with neuro.  MRI and MRA nromal.  Pleased with Sonata  prn. Disc risk of zaleplon  and risk falls etc.  We discussed the short-term risks associated with benzodiazepines including sedation and increased fall risk among others.  Discussed long-term side effect risk including dependence, potential withdrawal symptoms, and the potential eventual dose-related risk of dementia.  But recent studies from 2020 dispute this association between benzodiazepines and dementia risk. Newer studies in 2020 do not support an association with dementia.  Disc this in great detail bc she had questions, as well as alternatives like trazodone  or melatonin.  Also disc CBT for sleep. She wants to stick with what she knows and feels comfortable with it. It works well but doesn't take often.   No other psych med except Sonata  10 mg prn.    FU 12 mos since only rare use Sonata   Haley Beat, MD, DFAPA   Please see After Visit Summary for patient specific instructions.  Future Appointments  Date Time Provider Department Center  04/02/2024  2:00 PM Sheryle Donning, MD [facility]  07/14/2024  1:00 PM Cottle, Kennedy Peabody., MD [facility] None  07/29/2024  1:00 PM Nicholas Bari, MD [facility] None    No orders of the defined types were placed in this encounter.   -------------------------------

## 2024-01-09 NOTE — Progress Notes (Signed)
 Office Visit Note  Patient: Haley Wade             Date of Birth: 09-Sep-1945           MRN: [id number]             PCP: Margarete Sharps, MD Referring: Margarete Sharps, MD Visit Date: 01/22/2024 Occupation: @GUAROCC @  Subjective:  Facial pain    History of Present Illness: Haley Wade is a 78 y.o. female with inflammatory arthritis.  She returns today for a follow-up visit.  She states in January she started having pain on the right side of her face which started in her ear and radiated to her jaw and her scalp.  She states the pain persisted and she thought it was related to water aerobics.  Eventually she went to see her ENT as she was concerned that it may be related to the hearing aids.  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  She was referred to ENT for evaluation and she is scheduled to have left parotidectomy on February 12, 2024. She was also evaluated by the neurologist.  She had extensive workup including MRI of the brain followed by MRA.  MRA of the brain was unremarkable on Jan 03, 2024.  An EEG was performed.  She had multiple brief episodes of asymmetric slowing in the left temporal region.  She has a follow-up appointment coming up with the neurologist. She continues to have some discomfort in her hands.  She has not noticed any joint swelling.  She has not had any flares of pseudogout.  She has some discomfort in her bilateral ring fingers.  She does not have much discomfort in her knees, neck or lower back currently.   Activities of Daily Living:  Patient reports morning stiffness for 5-10 minutes.   Patient Denies nocturnal pain.  Difficulty dressing/grooming: Denies Difficulty climbing stairs: Denies Difficulty getting out of chair: Denies Difficulty using hands for taps, buttons, cutlery, and/or writing: Denies  Review of Systems  Constitutional:  Positive for fatigue.  HENT:  Positive for mouth dryness. Negative for mouth  sores.   Eyes:  Negative for dryness.  Respiratory:  Negative for shortness of breath.   Cardiovascular:  Positive for palpitations. Negative for chest pain.  Gastrointestinal:  Negative for blood in stool, constipation and diarrhea.  Endocrine: Negative for increased urination.  Genitourinary:  Negative for involuntary urination.  Musculoskeletal:  Positive for joint pain, joint pain, joint swelling and morning stiffness. Negative for gait problem, myalgias, muscle weakness, muscle tenderness and myalgias.  Skin:  Positive for hair loss. Negative for color change, rash and sensitivity to sunlight.  Allergic/Immunologic: Negative for susceptible to infections.  Neurological:  Positive for light-headedness. Negative for dizziness and headaches.  Hematological:  Negative for swollen glands.  Psychiatric/Behavioral:  Negative for depressed mood and sleep disturbance. The patient is not nervous/anxious.     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   Inflammatory arthritis 11/16/2021   Complete tear of right rotator cuff    Biceps tendonitis on right    S/P rotator cuff repair 02/08/2021   Chest discomfort 01/26/2014   Palpitations 01/26/2014   Cellulitis 09/08/2013   Abdominal pain 02/26/2013   Hyponatremia 10/30/2012  COPD, questioned 01/02/2012   Allergic rhinitis due to pollen 10/20/2010   History of cardiovascular disorder 05/26/2009   Hearing loss 05/21/2009   RHINOSINUSITIS, 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    Past Medical History:  Diagnosis Date   Allergic rhinitis, cause unspecified    Anemia    Arthritis    Cellulitis    Chest discomfort    COPD, questioned    GAVE (gastric antral vascular ectasia)    Hydrocephalus (HCC) 11/21/2023   Hyponatremia    MITRAL VALVE  PROLAPSE, HX OF    Opacified ethmoid sinus 11/21/2023   Other chronic sinusitis    Palpitations    Syncope    Unspecified hearing loss     Family History  Problem Relation Age of Onset   Other Mother        c difficile gastroenteritis   Lung cancer Father        smoker   Liver disease Brother    Liver cancer Brother    Asthma Other        grandmother   Other Other        bronchitis-grandmother   Past Surgical History:  Procedure Laterality Date   APPENDECTOMY     BACK SURGERY     BICEPT TENODESIS Right 02/08/2021   Procedure: BICEPS TENODESIS;  Surgeon: Jasmine Mesi, MD;  Location: [facility] OR;  Service: Orthopedics;  Laterality: Right;   BLADDER REPAIR  02/2023   BLADDER SURGERY     BOWEL RESECTION N/A 11/01/2012   Procedure: SMALL BOWEL RESECTION;  Surgeon: Keitha Pata, MD;  Location: [facility] ORS;  Service: General;  Laterality: N/A;   BREAST ENHANCEMENT SURGERY     revisions   BREAST SURGERY     CHOLECYSTECTOMY     COLON SURGERY     ESOPHAGOGASTRODUODENOSCOPY (EGD) WITH PROPOFOL  N/A 10/14/2019   Procedure: ESOPHAGOGASTRODUODENOSCOPY (EGD) WITH PROPOFOL ;  Surgeon: Ozell Blunt, MD;  Location: [facility];  Service: Endoscopy;  Laterality: N/A;   EYE SURGERY     GI RADIOFREQUENCY ABLATION  10/14/2019   Procedure: GI RADIOFREQUENCY ABLATION;  Surgeon: Ozell Blunt, MD;  Location: [facility];  Service: Endoscopy;;   LAPAROSCOPY N/A 11/01/2012   Procedure: LAPAROSCOPY DIAGNOSTIC;  Surgeon: Keitha Pata, MD;  Location: [facility];  Service: General;  Laterality: N/A;   LAPAROTOMY N/A 11/01/2012   Procedure: EXPLORATORY LAPAROTOMY;  Surgeon: Keitha Pata, MD;  Location: [facility];  Service: General;  Laterality: N/A;   LYSIS OF ADHESION N/A 11/01/2012   Procedure: LYSIS OF ADHESION;  Surgeon: Keitha Pata, MD;  Location: [facility];  Service: General;  Laterality: N/A;   RHINOPLASTY     SHOULDER ARTHROSCOPY WITH OPEN ROTATOR CUFF REPAIR AND DISTAL CLAVICLE ACROMINECTOMY Right  02/08/2021   Procedure: SHOULDER ARTHROSCOPY WITH BICEPS TENDON RELEASE, MINI OPEN ROTATOR CUFF TEAR REPAIR OF THE INFRASPINATUS SUPRASPINATUS AND UPPER PORTIION OF THE SUBSCAP  WITH BICEPS TENODESIS;  Surgeon: Jasmine Mesi, MD;  Location: [facility] OR;  Service: Orthopedics;  Laterality: Right;   TONSILLECTOMY     TOTAL ABDOMINAL HYSTERECTOMY     Social History   Social History Narrative   Not on file   Immunization History  Administered Date(s) Administered   Influenza Split 06/26/2011, 06/25/2012, 06/28/2013   Pfizer(Comirnaty )Fall Seasonal Vaccine 12 years and older 08/18/2022, 09/07/2023   Respiratory Syncytial Virus Vaccine ,Recomb Aduvanted(Arexvy ) 05/11/2023   Tdap 09/08/2013, 04/13/2021     Objective: Vital Signs: BP 122/80 (BP Location: Left Arm, Patient Position: Sitting, Cuff Size: Normal)  Pulse 81   Resp 16   Ht 4' 11.5" (1.511 m)   Wt 96 lb 12.8 oz (43.9 kg)   BMI 19.22 kg/m    Physical Exam Vitals and nursing note reviewed.  Constitutional:      Appearance: She is well-developed.  HENT:     Head: Normocephalic and atraumatic.  Eyes:     Conjunctiva/sclera: Conjunctivae normal.  Cardiovascular:     Rate and Rhythm: Normal rate and regular rhythm.     Heart sounds: Normal heart sounds.  Pulmonary:     Effort: Pulmonary effort is normal.     Breath sounds: Normal breath sounds.  Abdominal:     General: Bowel sounds are normal.     Palpations: Abdomen is soft.  Musculoskeletal:     Cervical back: Normal range of motion.  Lymphadenopathy:     Cervical: No cervical adenopathy.  Skin:    General: Skin is warm and dry.     Capillary Refill: Capillary refill takes less than 2 seconds.  Neurological:     Mental Status: She is alert and oriented to person, place, and time.  Psychiatric:        Behavior: Behavior normal.      Musculoskeletal Exam: She had limited lateral rotation of the cervical spine without discomfort.  Thoracic and lumbar scoliosis was  noted.  She had no tenderness over thoracic or lumbar spine.  Shoulders, elbows, wrists, MCPs PIPs and DIPs were in good range of motion.  She had inflammation in her PIP joints as described below.  Hip joints and knee joints in good range of motion.  There was no tenderness over ankles or MTPs.  CDAI Exam: CDAI Score: -- Patient Global: --; Provider Global: -- Swollen: 3 ; Tender: 3  Joint Exam 01/22/2024      Right  Left  PIP 2 (finger)  Swollen Tender     PIP 4 (finger)  Swollen Tender  Swollen Tender     Investigation: No additional findings.  Imaging: No results found.  Recent Labs: Lab Results  Component Value Date   WBC 8.4 10/25/2022   HGB 13.2 10/25/2022   PLT 265 10/25/2022   NA 135 10/25/2022   K 4.9 10/25/2022   CL 100 10/25/2022   CO2 25 10/25/2022   GLUCOSE 87 10/25/2022   BUN 31 (H) 10/25/2022   CREATININE 0.59 (L) 10/25/2022   BILITOT 0.4 10/25/2022   ALKPHOS 35 (L) 11/17/2021   AST 16 10/25/2022   ALT 15 10/25/2022   PROT 7.2 10/25/2022   ALBUMIN  3.0 (L) 11/17/2021   CALCIUM 9.8 10/25/2022   GFRAA >90 09/09/2013    Speciality Comments: PLQ-Rash  Procedures:   Ultrasound guided injection is preferred based studies that show increased duration, increased effect, greater accuracy, decreased procedural pain, increased response rate, and decreased cost with ultrasound guided versus blind injection.   Verbal informed consent obtained.  Time-out conducted.  Noted no overlying erythema, induration, or other signs of local infection. Ultrasound-guided bilateral fourth PIP joint injection: After sterile prep with Betadine , injected 0.5 mL of 1% lidocaine  and 10 mg Kenalog  using a 27-gauge needle, and the PIP joint.   Small Joint Inj: bilateral ring PIP on 01/22/2024 12:24 PM Indications: pain and joint swelling Details: 27 G needle, ultrasound-guided dorsal approach  Spinal Needle: No  Medications (Right): 0.5 mL lidocaine  1 %; 10 mg triamcinolone  acetonide  40 MG/ML Aspirate (Right): 0 mL Medications (Left): 0.5 mL lidocaine  1 %; 10 mg triamcinolone  acetonide 40  MG/ML Aspirate (Left): 0 mL Outcome: tolerated well, no immediate complications  Risk of infection, tendon injury, nerve injury, hypopigmentation and dermal atrophy were discussed. Procedure, treatment alternatives, risks and benefits explained, specific risks discussed. Consent was given by the patient. Immediately prior to procedure a time out was called to verify the correct patient, procedure, equipment, support staff and site/side marked as required. Patient was prepped and draped in the usual sterile fashion.     Allergies: Plaquenil [hydroxychloroquine sulfate], Bee venom, Other, and Terconazole   Assessment / Plan:     Visit Diagnoses: Inflammatory arthritis - History of intermittent swelling in wrist joints, hands and knee joints.  She continues to have pain and swelling in her hands.  Synovitis was noted in some of the PIP joints as described above.  Patient requested cortisone injection to her bilateral ring finger PIP joints.  Pseudogout-she has been taking colchicine  0.6 milligram only on as needed basis.  She states she recently discontinued colchicine .  Primary osteoarthritis of both hands - Right [facility] surgery by Dr. Donzella Galley. s/p cortisone injections on 10/23/2023: left index PIP and right small PIP, 07/23/2023: bilateral ring PIP injections.  She has been having increased pain and discomfort in her bilateral ring finger PIP joints.  She requested injections.  After informed consent was obtained and side effects were discussed bilateral ring finger PIP joints were injected with lidocaine  and Kenalog  as described above.  Patient tolerated the procedure well.  Postprocedure instructions were given.  S/P right rotator cuff repair- - By Dr. Rozelle Corning.  She had good range of motion.  Primary osteoarthritis of both knees-she had good range of motion without any warmth swelling or  effusion.  DDD (degenerative disc disease), cervical-she had to range of motion.  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  She had good mobility on the examination today.  Osteopenia of multiple sites - patient gets DEXA scan by her PCP.  DEXA results are not available.  Facial pain-patient has been bothered by facial pain and headaches recently.  She had extensive ENT and neurology workup.  I reviewed the records with the patient at length today.  She will follow-up with neurology and ENT.  History of pericarditis - In 1990s with no recurrence.  Syncope, unspecified syncope type - X 2.  GAD (generalized anxiety disorder)  History of mitral valve prolapse  GAVE (gastric antral vascular ectasia)  Seasonal allergic rhinitis due to pollen  Orders: No orders of the defined types were placed in this encounter.  No orders of the defined types were placed in this encounter.   Face-to-face time spent with patient was over 40 minutes. Greater than 50% of time was spent in counseling and coordination of care.  Follow-Up Instructions: Return for Osteoarthritis.   Nicholas Bari, MD  Note - This record has been created using Animal nutritionist.  Chart creation errors have been sought, but may not always  have been located. Such creation errors do not reflect on  the standard of medical care.

## 2024-01-10 DIAGNOSIS — Z23 Encounter for immunization: Secondary | ICD-10-CM | POA: Diagnosis not present

## 2024-01-10 DIAGNOSIS — Z1389 Encounter for screening for other disorder: Secondary | ICD-10-CM | POA: Diagnosis not present

## 2024-01-10 DIAGNOSIS — I340 Nonrheumatic mitral (valve) insufficiency: Secondary | ICD-10-CM | POA: Diagnosis not present

## 2024-01-10 DIAGNOSIS — F419 Anxiety disorder, unspecified: Secondary | ICD-10-CM | POA: Diagnosis not present

## 2024-01-10 DIAGNOSIS — R002 Palpitations: Secondary | ICD-10-CM | POA: Diagnosis not present

## 2024-01-10 DIAGNOSIS — N329 Bladder disorder, unspecified: Secondary | ICD-10-CM | POA: Diagnosis not present

## 2024-01-10 DIAGNOSIS — Z1331 Encounter for screening for depression: Secondary | ICD-10-CM | POA: Diagnosis not present

## 2024-01-10 DIAGNOSIS — I4510 Unspecified right bundle-branch block: Secondary | ICD-10-CM | POA: Diagnosis not present

## 2024-01-10 DIAGNOSIS — M79676 Pain in unspecified toe(s): Secondary | ICD-10-CM | POA: Diagnosis not present

## 2024-01-10 DIAGNOSIS — R413 Other amnesia: Secondary | ICD-10-CM | POA: Diagnosis not present

## 2024-01-10 DIAGNOSIS — A6000 Herpesviral infection of urogenital system, unspecified: Secondary | ICD-10-CM | POA: Diagnosis not present

## 2024-01-10 DIAGNOSIS — Z0000 Encounter for general adult medical examination without abnormal findings: Secondary | ICD-10-CM | POA: Diagnosis not present

## 2024-01-10 DIAGNOSIS — I700 Atherosclerosis of aorta: Secondary | ICD-10-CM | POA: Diagnosis not present

## 2024-01-10 DIAGNOSIS — R55 Syncope and collapse: Secondary | ICD-10-CM | POA: Diagnosis not present

## 2024-01-10 DIAGNOSIS — M154 Erosive (osteo)arthritis: Secondary | ICD-10-CM | POA: Diagnosis not present

## 2024-01-11 NOTE — Telephone Encounter (Signed)
 Results sent to patient via mychart in message requesting results.

## 2024-01-11 NOTE — Telephone Encounter (Signed)
 Monitor 01/03/24 report only review: predominantly NSR average HR 84 bpm, one short run NSVT, 41 episodes SVT associated with symptoms (longest 15 seconds) which was associated with symptoms.   If still bothered by palpitations, would change Metoprolol  Tartrate to 12.5mg  (half tablet) twice per day with additional half tablet as-needed for breakthrough palpitations.   Katlyne Nishida S Azura Tufaro, NP

## 2024-01-18 ENCOUNTER — Ambulatory Visit: Payer: Self-pay | Admitting: Cardiology

## 2024-01-22 ENCOUNTER — Ambulatory Visit

## 2024-01-22 ENCOUNTER — Encounter: Payer: Self-pay | Admitting: Rheumatology

## 2024-01-22 ENCOUNTER — Ambulatory Visit: Payer: Medicare Other | Attending: Rheumatology | Admitting: Rheumatology

## 2024-01-22 VITALS — BP 122/80 | HR 81 | Resp 16 | Ht 59.5 in | Wt 96.8 lb

## 2024-01-22 DIAGNOSIS — M19041 Primary osteoarthritis, right hand: Secondary | ICD-10-CM | POA: Diagnosis not present

## 2024-01-22 DIAGNOSIS — K31819 Angiodysplasia of stomach and duodenum without bleeding: Secondary | ICD-10-CM

## 2024-01-22 DIAGNOSIS — F411 Generalized anxiety disorder: Secondary | ICD-10-CM

## 2024-01-22 DIAGNOSIS — M170 Bilateral primary osteoarthritis of knee: Secondary | ICD-10-CM | POA: Diagnosis not present

## 2024-01-22 DIAGNOSIS — M19042 Primary osteoarthritis, left hand: Secondary | ICD-10-CM | POA: Insufficient documentation

## 2024-01-22 DIAGNOSIS — M8589 Other specified disorders of bone density and structure, multiple sites: Secondary | ICD-10-CM | POA: Diagnosis not present

## 2024-01-22 DIAGNOSIS — Z9889 Other specified postprocedural states: Secondary | ICD-10-CM

## 2024-01-22 DIAGNOSIS — R55 Syncope and collapse: Secondary | ICD-10-CM | POA: Diagnosis not present

## 2024-01-22 DIAGNOSIS — R519 Headache, unspecified: Secondary | ICD-10-CM

## 2024-01-22 DIAGNOSIS — M5030 Other cervical disc degeneration, unspecified cervical region: Secondary | ICD-10-CM | POA: Diagnosis not present

## 2024-01-22 DIAGNOSIS — M1120 Other chondrocalcinosis, unspecified site: Secondary | ICD-10-CM

## 2024-01-22 DIAGNOSIS — J301 Allergic rhinitis due to pollen: Secondary | ICD-10-CM

## 2024-01-22 DIAGNOSIS — M1990 Unspecified osteoarthritis, unspecified site: Secondary | ICD-10-CM

## 2024-01-22 DIAGNOSIS — M47816 Spondylosis without myelopathy or radiculopathy, lumbar region: Secondary | ICD-10-CM | POA: Diagnosis not present

## 2024-01-22 DIAGNOSIS — Z8679 Personal history of other diseases of the circulatory system: Secondary | ICD-10-CM | POA: Diagnosis not present

## 2024-01-22 MED ORDER — TRIAMCINOLONE ACETONIDE 40 MG/ML IJ SUSP
10.0000 mg | INTRAMUSCULAR | Status: AC | PRN
Start: 2024-01-22 — End: 2024-01-22
  Administered 2024-01-22: 10 mg via INTRA_ARTICULAR

## 2024-01-22 MED ORDER — LIDOCAINE HCL 1 % IJ SOLN
0.5000 mL | INTRAMUSCULAR | Status: AC | PRN
Start: 2024-01-22 — End: 2024-01-22
  Administered 2024-01-22: .5 mL

## 2024-01-23 DIAGNOSIS — J343 Hypertrophy of nasal turbinates: Secondary | ICD-10-CM | POA: Diagnosis not present

## 2024-01-23 DIAGNOSIS — J3489 Other specified disorders of nose and nasal sinuses: Secondary | ICD-10-CM | POA: Diagnosis not present

## 2024-01-23 DIAGNOSIS — J342 Deviated nasal septum: Secondary | ICD-10-CM | POA: Diagnosis not present

## 2024-01-23 DIAGNOSIS — J322 Chronic ethmoidal sinusitis: Secondary | ICD-10-CM | POA: Diagnosis not present

## 2024-01-25 DIAGNOSIS — Z01818 Encounter for other preprocedural examination: Secondary | ICD-10-CM | POA: Diagnosis not present

## 2024-01-25 DIAGNOSIS — G4700 Insomnia, unspecified: Secondary | ICD-10-CM | POA: Diagnosis not present

## 2024-01-25 DIAGNOSIS — K119 Disease of salivary gland, unspecified: Secondary | ICD-10-CM | POA: Diagnosis not present

## 2024-01-29 DIAGNOSIS — G500 Trigeminal neuralgia: Secondary | ICD-10-CM | POA: Diagnosis not present

## 2024-02-12 DIAGNOSIS — I340 Nonrheumatic mitral (valve) insufficiency: Secondary | ICD-10-CM | POA: Diagnosis not present

## 2024-02-12 DIAGNOSIS — K118 Other diseases of salivary glands: Secondary | ICD-10-CM | POA: Diagnosis not present

## 2024-02-12 DIAGNOSIS — D110 Benign neoplasm of parotid gland: Secondary | ICD-10-CM | POA: Diagnosis not present

## 2024-02-25 DIAGNOSIS — K118 Other diseases of salivary glands: Secondary | ICD-10-CM | POA: Diagnosis not present

## 2024-03-09 IMAGING — DX DG WRIST COMPLETE 3+V*L*
4 series · 4 of 4 positions shown · non-contrast
Comparison: None Available.

CLINICAL DATA: Wrist

EXAM:
LEFT WRIST - COMPLETE 3+ VIEW

[wrist ap (1 of 2)]
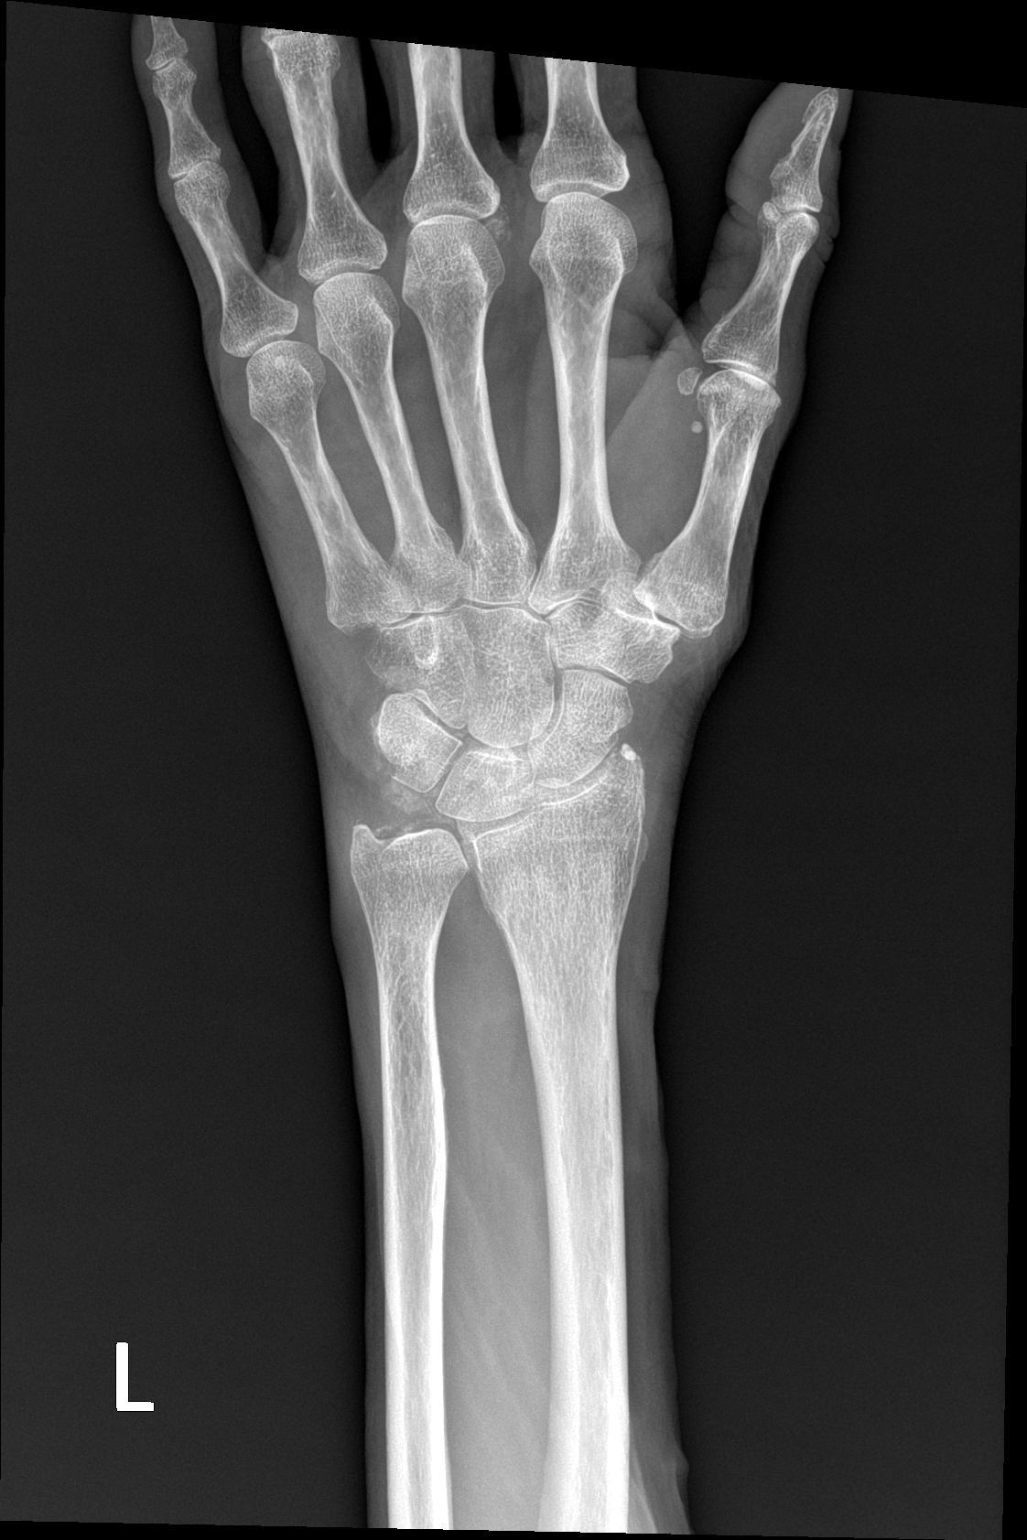

[wrist obl]
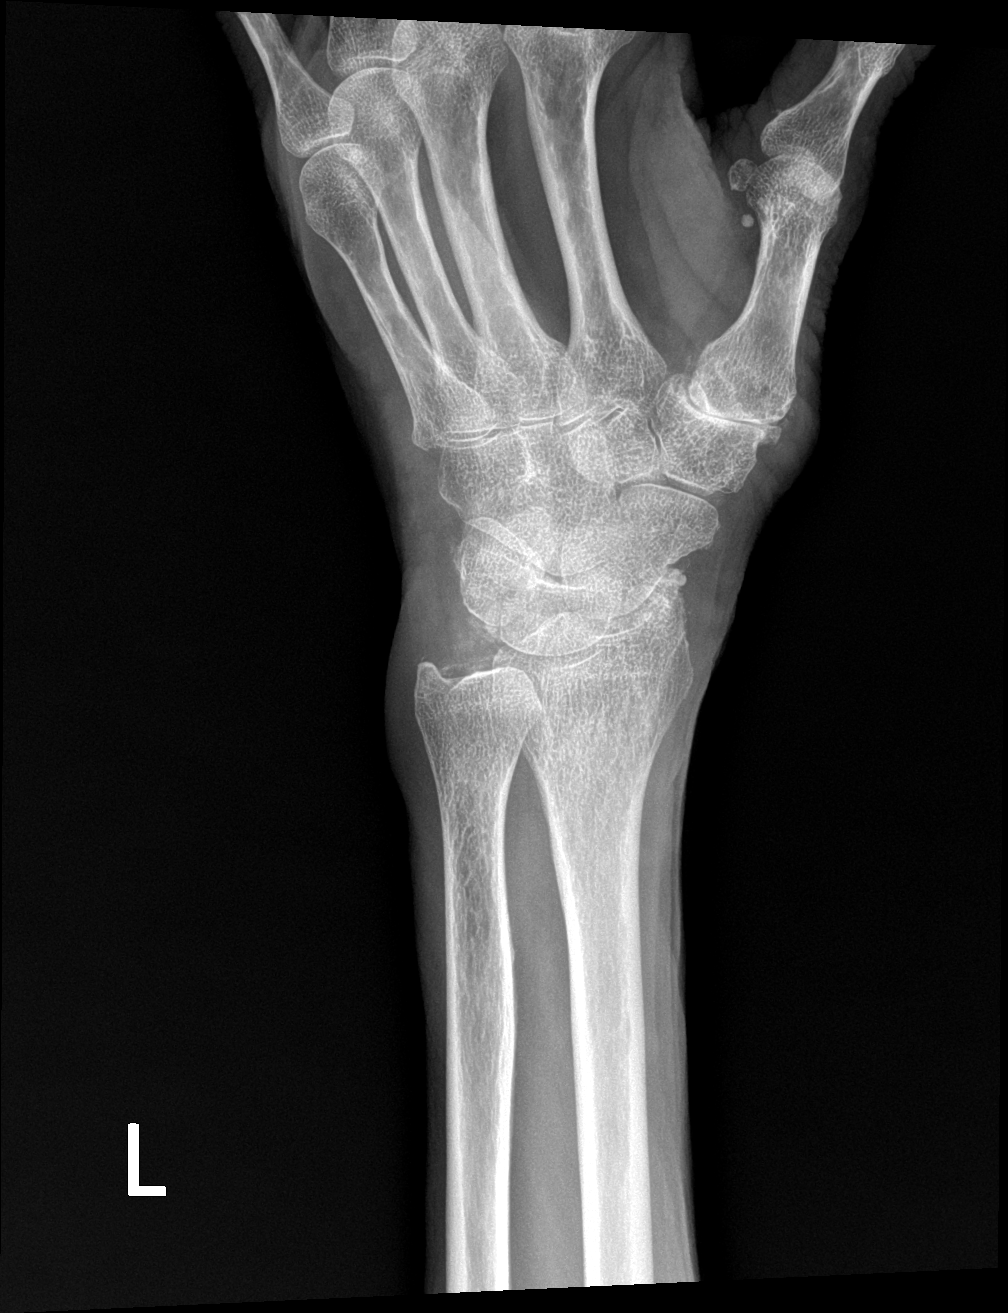

[wrist lat]
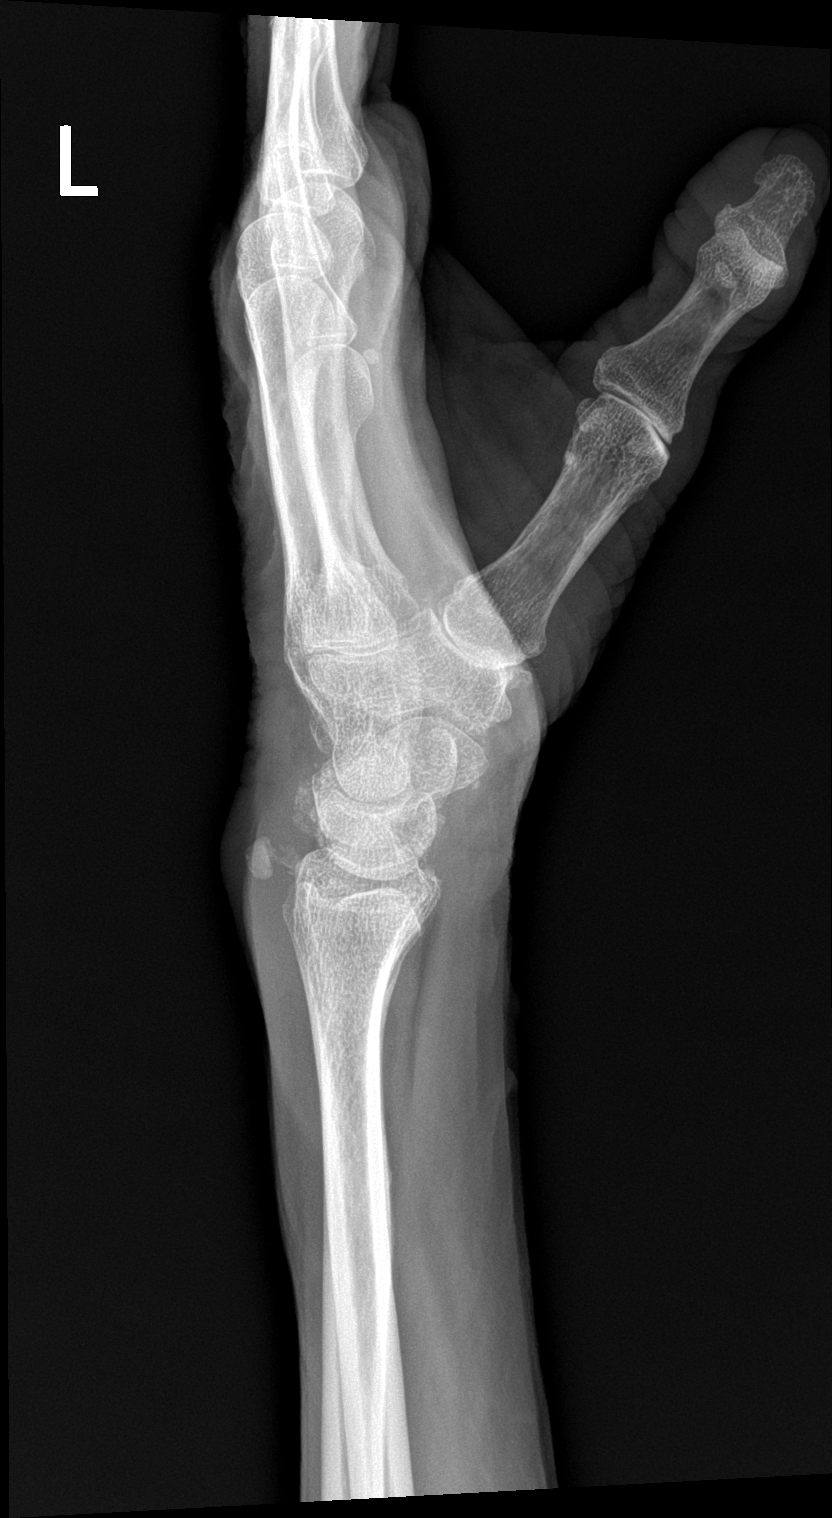

[wrist ap (2 of 2)]
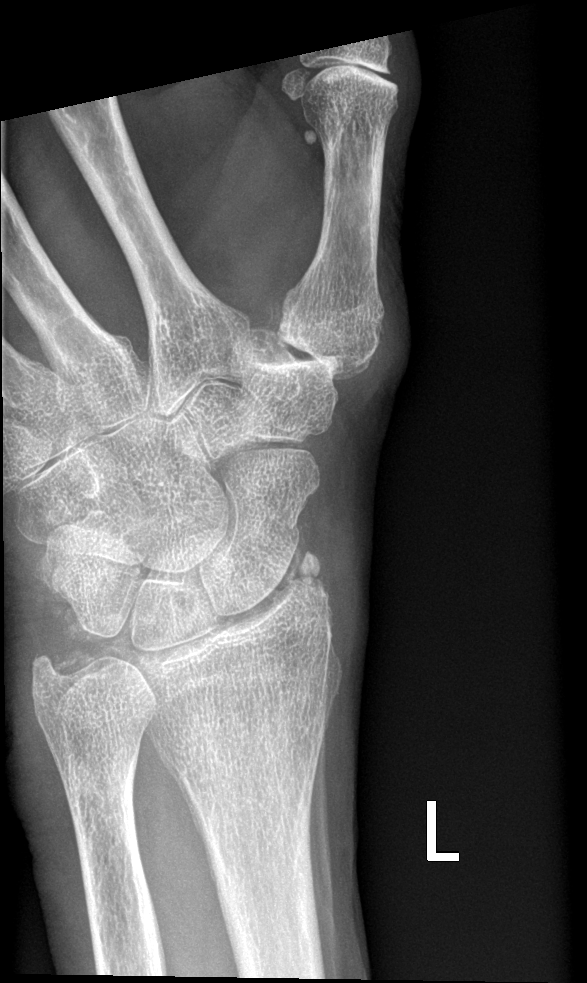

[4 of 4 positions shown; findings below may reference images not displayed]

FINDINGS: There is a well corticated bony fragment along the dorsal aspect of
the wrist. There is adjacent soft tissue swelling there is
radiocarpal joint space narrowing and chondrocalcinosis. There is
also chondrocalcinosis at the third MCP joint. There is mild base of
thumb osteoarthritis.
IMPRESSION: Well corticated bony fragment along the dorsal aspect of the wrist,
appearance favoring a chronic triquetral injury, though there is
adjacent soft tissue swelling along the dorsal wrist.

Mild to moderate radiocarpal arthritis with chondrocalcinosis
throughout the wrist as well as at the third MCP joint, can be seen
in CPPD arthropathy. CPPD arthropathy could account for wrist
swelling.

Mild base of thumb arthritis.

## 2024-03-28 DIAGNOSIS — J343 Hypertrophy of nasal turbinates: Secondary | ICD-10-CM | POA: Diagnosis not present

## 2024-03-28 DIAGNOSIS — J322 Chronic ethmoidal sinusitis: Secondary | ICD-10-CM | POA: Diagnosis not present

## 2024-03-28 DIAGNOSIS — J3489 Other specified disorders of nose and nasal sinuses: Secondary | ICD-10-CM | POA: Diagnosis not present

## 2024-03-28 DIAGNOSIS — J342 Deviated nasal septum: Secondary | ICD-10-CM | POA: Diagnosis not present

## 2024-03-28 DIAGNOSIS — Z79899 Other long term (current) drug therapy: Secondary | ICD-10-CM | POA: Diagnosis not present

## 2024-04-02 ENCOUNTER — Ambulatory Visit (HOSPITAL_BASED_OUTPATIENT_CLINIC_OR_DEPARTMENT_OTHER): Admitting: Cardiology

## 2024-04-02 DIAGNOSIS — J322 Chronic ethmoidal sinusitis: Secondary | ICD-10-CM | POA: Diagnosis not present

## 2024-04-02 DIAGNOSIS — R519 Headache, unspecified: Secondary | ICD-10-CM | POA: Diagnosis not present

## 2024-04-02 DIAGNOSIS — Z9889 Other specified postprocedural states: Secondary | ICD-10-CM | POA: Diagnosis not present

## 2024-04-07 DIAGNOSIS — R208 Other disturbances of skin sensation: Secondary | ICD-10-CM | POA: Diagnosis not present

## 2024-04-07 DIAGNOSIS — L089 Local infection of the skin and subcutaneous tissue, unspecified: Secondary | ICD-10-CM | POA: Diagnosis not present

## 2024-04-10 ENCOUNTER — Ambulatory Visit: Payer: Medicare Other | Admitting: Psychiatry

## 2024-04-16 DIAGNOSIS — Z9889 Other specified postprocedural states: Secondary | ICD-10-CM | POA: Diagnosis not present

## 2024-04-16 DIAGNOSIS — J322 Chronic ethmoidal sinusitis: Secondary | ICD-10-CM | POA: Diagnosis not present

## 2024-04-25 DIAGNOSIS — G500 Trigeminal neuralgia: Secondary | ICD-10-CM | POA: Diagnosis not present

## 2024-05-01 DIAGNOSIS — F1090 Alcohol use, unspecified, uncomplicated: Secondary | ICD-10-CM | POA: Diagnosis not present

## 2024-05-01 DIAGNOSIS — Z87891 Personal history of nicotine dependence: Secondary | ICD-10-CM | POA: Diagnosis not present

## 2024-05-01 DIAGNOSIS — R42 Dizziness and giddiness: Secondary | ICD-10-CM | POA: Diagnosis not present

## 2024-05-01 DIAGNOSIS — H608X1 Other otitis externa, right ear: Secondary | ICD-10-CM | POA: Diagnosis not present

## 2024-05-01 DIAGNOSIS — Z9889 Other specified postprocedural states: Secondary | ICD-10-CM | POA: Diagnosis not present

## 2024-05-01 DIAGNOSIS — H90A21 Sensorineural hearing loss, unilateral, right ear, with restricted hearing on the contralateral side: Secondary | ICD-10-CM | POA: Diagnosis not present

## 2024-05-01 DIAGNOSIS — H903 Sensorineural hearing loss, bilateral: Secondary | ICD-10-CM | POA: Diagnosis not present

## 2024-05-01 DIAGNOSIS — H6123 Impacted cerumen, bilateral: Secondary | ICD-10-CM | POA: Diagnosis not present

## 2024-05-01 DIAGNOSIS — Z48810 Encounter for surgical aftercare following surgery on the sense organs: Secondary | ICD-10-CM | POA: Diagnosis not present

## 2024-05-14 ENCOUNTER — Other Ambulatory Visit (HOSPITAL_BASED_OUTPATIENT_CLINIC_OR_DEPARTMENT_OTHER): Payer: Self-pay

## 2024-05-14 MED ORDER — COMIRNATY 30 MCG/0.3ML IM SUSY
0.3000 mL | PREFILLED_SYRINGE | Freq: Once | INTRAMUSCULAR | 0 refills | Status: AC
  Filled 2024-05-14: qty 0.3, 1d supply, fill #0

## 2024-05-30 DIAGNOSIS — N3946 Mixed incontinence: Secondary | ICD-10-CM | POA: Diagnosis not present

## 2024-06-02 DIAGNOSIS — R002 Palpitations: Secondary | ICD-10-CM | POA: Diagnosis not present

## 2024-06-02 DIAGNOSIS — M542 Cervicalgia: Secondary | ICD-10-CM | POA: Diagnosis not present

## 2024-06-02 DIAGNOSIS — M952 Other acquired deformity of head: Secondary | ICD-10-CM | POA: Diagnosis not present

## 2024-06-02 DIAGNOSIS — R519 Headache, unspecified: Secondary | ICD-10-CM | POA: Diagnosis not present

## 2024-06-02 DIAGNOSIS — G500 Trigeminal neuralgia: Secondary | ICD-10-CM | POA: Diagnosis not present

## 2024-06-02 DIAGNOSIS — R202 Paresthesia of skin: Secondary | ICD-10-CM | POA: Diagnosis not present

## 2024-06-06 ENCOUNTER — Ambulatory Visit (HOSPITAL_BASED_OUTPATIENT_CLINIC_OR_DEPARTMENT_OTHER): Admitting: Cardiology

## 2024-06-06 ENCOUNTER — Encounter (HOSPITAL_BASED_OUTPATIENT_CLINIC_OR_DEPARTMENT_OTHER): Payer: Self-pay | Admitting: Cardiology

## 2024-06-06 VITALS — BP 124/72 | HR 81 | Ht 59.5 in | Wt 96.6 lb

## 2024-06-06 DIAGNOSIS — I341 Nonrheumatic mitral (valve) prolapse: Secondary | ICD-10-CM

## 2024-06-06 DIAGNOSIS — R0609 Other forms of dyspnea: Secondary | ICD-10-CM

## 2024-06-06 DIAGNOSIS — R002 Palpitations: Secondary | ICD-10-CM

## 2024-06-06 DIAGNOSIS — I340 Nonrheumatic mitral (valve) insufficiency: Secondary | ICD-10-CM

## 2024-06-06 DIAGNOSIS — I471 Supraventricular tachycardia, unspecified: Secondary | ICD-10-CM

## 2024-06-06 DIAGNOSIS — Z7189 Other specified counseling: Secondary | ICD-10-CM

## 2024-06-06 NOTE — Patient Instructions (Signed)
 Medication Instructions:  Your physician recommends that you continue on your current medications as directed. Please refer to the Current Medication list given to you today.  *If you need a refill on your cardiac medications before your next appointment, please call your pharmacy*  Lab Work: none If you have labs (blood work) drawn today and your tests are completely normal, you will receive your results only by: MyChart Message (if you have MyChart) OR A paper copy in the mail If you have any lab test that is abnormal or we need to change your treatment, we will call you to review the results.  Testing/Procedures: none  Follow-Up: At [facility], you and your health needs are our priority.  As part of our continuing mission to provide you with exceptional heart care, our providers are all part of one team.  This team includes your primary Cardiologist (physician) and Advanced Practice Providers or APPs (Physician Assistants and Nurse Practitioners) who all work together to provide you with the care you need, when you need it.  Your next appointment:   6 month(s)  Provider:   Shelda Bruckner, MD or Reche Finder, NP    We recommend signing up for the patient portal called MyChart.  Sign up information is provided on this After Visit Summary.  MyChart is used to connect with patients for Virtual Visits (Telemedicine).  Patients are able to view lab/test results, encounter notes, upcoming appointments, etc.  Non-urgent messages can be sent to your provider as well.   To learn more about what you can do with MyChart, go to [email or web address].   Other Instructions

## 2024-06-06 NOTE — Progress Notes (Signed)
 Cardiology Office Note:  .   Date:  06/06/2024  ID:  Haley Wade, DOB 12/29/1945, MRN [id number] PCP: Onita Rush, MD  [facility] Providers Cardiologist:  Shelda Bruckner, MD {  History of Present Illness: .   Haley Wade is a 78 y.o. 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 She was previously followed by Dr. Claudene and Dr. Jeffrie and established with me on 06/06/24.  Pertinent CV history: Monitor 2021 with isolated PVC correlating with complaints of flutter. Prior myoview  2022 low risk with LVEF 77%. Previously on low dose Toprol  XL but discontinued due to adverse effects particularly at higher doses including stomach upset and bloating.    Last seen by Dr. Jeffrie 07/09/23 noting a clenching sensation in her chest. Subsequent echo 08/10/23 normal LVEF 60-65%, gr1dd, RV normal, moderate MR, mild holosystolic prolapse of middle scallop of posterior leaflet of MV. Plan to consider low dose diltiazem vs verapamil if symptoms progressed.   Today: Noted that when she saw urologist last week, her diastolic blood pressure was 61. On recheck with her PCP, diastolic was 65. Today was 72. 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 We discussed that the number is less important than the symptoms, but if she develops worsening lightheadedness, would need to consider cutting back on spironolactone  and/or minoxidil.  Feels a catch in her chest a few times/day. Brief, self limited. No major chest pain/discomfort.  Walks a lot, feels mildly short of breath on incline, does not have to stop while going up but sometimes has to stop at the peak of a hill. Does try to stay as active as possible. Eats healthy.  Has occasional palpitations, reviewed her monitor results. No long episodes but occasional brief events. Has taken metoprolol , most recent a few  weeks ago, works well for her.  Has had chronic mild weakness in her left arm, numbness in tongue/cheek. Has discussed with her PCP.  ROS: Denies chest pain, shortness of breath at rest. No PND, orthopnea, LE edema or unexpected weight gain. No syncope. ROS otherwise negative except as noted.   Studies Reviewed: SABRA    EKG:       Physical Exam:   VS:  BP 124/72   Pulse 81   Ht 4' 11.5 (1.511 m)   Wt 96 lb 9.6 oz (43.8 kg)   SpO2 98%   BMI 19.18 kg/m    Wt Readings from Last 3 Encounters:  06/06/24 96 lb 9.6 oz (43.8 kg)  01/22/24 96 lb 12.8 oz (43.9 kg)  11/19/23 99 lb (44.9 kg)    GEN: Well nourished, well developed in no acute distress HEENT: Normal, moist mucous membranes NECK: No JVD CARDIAC: regular rhythm, normal S1 and S2, no rubs or gallops. No murmur. VASCULAR: Radial and DP pulses 2+ bilaterally. No carotid bruits RESPIRATORY:  Clear to auscultation without rales, wheezing or rhonchi  ABDOMEN: Soft, non-tender, non-distended MUSCULOSKELETAL:  Ambulates independently SKIN: Warm and dry, no edema NEUROLOGIC:  Alert and oriented x 3. No focal neuro deficits noted. PSYCHIATRIC:  Normal affect    ASSESSMENT AND PLAN: .    Dyspnea on exertion -stable, continue to monitor, reviewed concerning signs/symptoms  Palpitations pSVT -intermittent events, has PRN metoprolol  which works well for her -discussed that if events become more frequent/bothersome, may need to change to daily metoprolol . With her blood pressure, we discussed that we  might have to cut back either spironolactone  or minoxidil  Mitral regurgitation 2/2 mild MVP -repeat echo scheduled 07/28/24  Hair loss -taking spironolactone  50 mg TID and minoxidil. Discussed that this can predispose to hypotension  CV risk counseling and prevention -recommend heart healthy/Mediterranean diet, with whole grains, fruits, vegetable, fish, lean meats, nuts, and olive oil. Limit salt. -recommend moderate walking, 3-5  times/week for 30-50 minutes each session. Aim for at least 150 minutes/week. Goal should be pace of 3 miles/hours, or walking 1.5 miles in 30 minutes -recommend avoidance of tobacco products. Avoid excess alcohol .  Dispo: 6 mos with Reche Finder or myself  Signed, Shelda Bruckner, MD   Shelda Bruckner, MD, PhD, [facility]  [facility]  [place]  [facility] at [facility] at [facility] [address], [address] [place], [place] [address] [phone]

## 2024-06-16 ENCOUNTER — Encounter: Payer: Self-pay | Admitting: Radiology

## 2024-06-16 DIAGNOSIS — R208 Other disturbances of skin sensation: Secondary | ICD-10-CM | POA: Diagnosis not present

## 2024-06-16 DIAGNOSIS — L649 Androgenic alopecia, unspecified: Secondary | ICD-10-CM | POA: Diagnosis not present

## 2024-06-19 NOTE — Progress Notes (Signed)
 Office Visit Note  Patient: Haley Wade             Date of Birth: 1945-12-14           MRN: [id number]             PCP: Onita Rush, MD Referring: Onita Rush, MD Visit Date: 07/03/2024 Occupation: Data Unavailable  Subjective:  Joint stiffness  History of Present Illness: Elizaveta Mattice is a 78 y.o. female with osteoarthritis, inflammatory arthritis and pseudogout.  She returns today after her last visit in June 2025.  She states her bilateral ring PIP injections were effective and she had no recurrence of discomfort.  She states she had a left parotidectomy by ENT and then another surgery for chronic sinusitis.  She states since then she has noticed improvement in her facial pain.  She denies any joint inflammation.  She has some stiffness in her bilateral ring finger and little fingers.  She had 2 injections in her bilateral knee joints by Dr. Addie yesterday which helped.  She states she has severe osteoarthritis.  Neck and lower back pain is manageable.  She has been is doing stretching exercises.    Activities of Daily Living:  Patient reports morning stiffness for  15 minutes.   Patient Denies nocturnal pain.  Difficulty dressing/grooming: Denies Difficulty climbing stairs: Reports Difficulty getting out of chair: Denies Difficulty using hands for taps, buttons, cutlery, and/or writing: Reports  Review of Systems  Constitutional:  Negative for fatigue.  HENT:  Positive for mouth dryness. Negative for mouth sores.   Eyes:  Negative for dryness.  Respiratory:  Positive for shortness of breath.        On exertion   Cardiovascular:  Negative for chest pain and palpitations.  Gastrointestinal:  Negative for blood in stool, constipation and diarrhea.  Endocrine: Negative for increased urination.  Genitourinary:  Negative for involuntary urination.  Musculoskeletal:  Positive for joint pain, joint pain, joint swelling and morning stiffness. Negative for gait  problem, myalgias, muscle weakness, muscle tenderness and myalgias.  Skin:  Positive for hair loss. Negative for color change, rash and sensitivity to sunlight.  Allergic/Immunologic: Negative for susceptible to infections.  Neurological:  Positive for dizziness. Negative for headaches.  Hematological:  Negative for swollen glands.  Psychiatric/Behavioral:  Negative for depressed mood and sleep disturbance. The patient is not nervous/anxious.     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   Inflammatory arthritis 11/16/2021   Complete tear of right rotator cuff    Biceps tendonitis on right    S/P rotator cuff repair 02/08/2021   Chest discomfort 01/26/2014   Palpitations 01/26/2014   Cellulitis 09/08/2013   Abdominal pain 02/26/2013   Hyponatremia 10/30/2012   COPD, questioned 01/02/2012   Allergic rhinitis due to pollen 10/20/2010   History of cardiovascular disorder 05/26/2009   Hearing loss 05/21/2009   RHINOSINUSITIS, 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    Past Medical History:  Diagnosis Date   Allergic rhinitis, cause unspecified    Anemia    Arthritis    Cellulitis    Chest discomfort    COPD, questioned    GAVE (gastric antral vascular ectasia)  Hydrocephalus (HCC) 11/21/2023   Hyponatremia    MITRAL VALVE PROLAPSE, HX OF    Opacified ethmoid sinus 11/21/2023   Other chronic sinusitis    Palpitations    Syncope    Unspecified hearing loss     Family History  Problem Relation Age of Onset   Other Mother        c difficile gastroenteritis   Lung cancer Father        smoker   Liver disease Brother    Liver cancer Brother    Asthma  Other        grandmother   Other Other        bronchitis-grandmother   Past Surgical History:  Procedure Laterality Date   APPENDECTOMY     BACK SURGERY     BICEPT TENODESIS Right 02/08/2021   Procedure: BICEPS TENODESIS;  Surgeon: Addie Cordella Hamilton, MD;  Location: [facility] OR;  Service: Orthopedics;  Laterality: Right;   BLADDER REPAIR  02/2023   BLADDER SURGERY     BOWEL RESECTION N/A 11/01/2012   Procedure: SMALL BOWEL RESECTION;  Surgeon: Krystal CHRISTELLA Spinner, MD;  Location: [facility];  Service: General;  Laterality: N/A;   BREAST ENHANCEMENT SURGERY     revisions   BREAST SURGERY     CHOLECYSTECTOMY     COLON SURGERY     ESOPHAGOGASTRODUODENOSCOPY (EGD) WITH PROPOFOL  N/A 10/14/2019   Procedure: ESOPHAGOGASTRODUODENOSCOPY (EGD) WITH PROPOFOL ;  Surgeon: Rosalie Kitchens, MD;  Location: [facility];  Service: Endoscopy;  Laterality: N/A;   EYE SURGERY     GI RADIOFREQUENCY ABLATION  10/14/2019   Procedure: GI RADIOFREQUENCY ABLATION;  Surgeon: Rosalie Kitchens, MD;  Location: [facility];  Service: Endoscopy;;   LAPAROSCOPY N/A 11/01/2012   Procedure: LAPAROSCOPY DIAGNOSTIC;  Surgeon: Krystal CHRISTELLA Spinner, MD;  Location: [facility];  Service: General;  Laterality: N/A;   LAPAROTOMY N/A 11/01/2012   Procedure: EXPLORATORY LAPAROTOMY;  Surgeon: Krystal CHRISTELLA Spinner, MD;  Location: [facility];  Service: General;  Laterality: N/A;   LYSIS OF ADHESION N/A 11/01/2012   Procedure: LYSIS OF ADHESION;  Surgeon: Krystal CHRISTELLA Spinner, MD;  Location: [facility];  Service: General;  Laterality: N/A;   RHINOPLASTY     SHOULDER ARTHROSCOPY WITH OPEN ROTATOR CUFF REPAIR AND DISTAL CLAVICLE ACROMINECTOMY Right 02/08/2021   Procedure: SHOULDER ARTHROSCOPY WITH BICEPS TENDON RELEASE, MINI OPEN ROTATOR CUFF TEAR REPAIR OF THE INFRASPINATUS SUPRASPINATUS AND UPPER PORTIION OF THE SUBSCAP  WITH BICEPS TENODESIS;  Surgeon: Addie Cordella Hamilton, MD;  Location: [facility] OR;  Service: Orthopedics;  Laterality: Right;   TONSILLECTOMY     TOTAL ABDOMINAL HYSTERECTOMY      Social History   Tobacco Use   Smoking status: Former    Current packs/day: 0.00    Average packs/day: 0.5 packs/day for 1.5 years (0.7 ttl pk-yrs)    Types: Cigarettes    Start date: 02/14/1979    Quit date: 08/14/1980    Years since quitting: 43.9    Passive exposure: Past   Smokeless tobacco: Never  Vaping Use   Vaping status: Never Used  Substance Use Topics   Alcohol  use: No    Comment: very little   Drug use: No   Social History   Social History Narrative   Not on file     Immunization History  Administered Date(s) Administered   INFLUENZA, HIGH DOSE SEASONAL PF 06/30/2024   Influenza Split 06/26/2011, 06/25/2012, 06/28/2013   Pfizer(Comirnaty )Fall Seasonal Vaccine 12 years and older 08/18/2022, 09/07/2023, 05/14/2024   Respiratory  Syncytial Virus Vaccine,Recomb Aduvanted(Arexvy ) 05/11/2023   Tdap 09/08/2013, 04/13/2021     Objective: Vital Signs: BP 134/75   Pulse 80   Temp 97.6 F (36.4 C)   Resp 14   Ht 4' 11.5 (1.511 m)   Wt 97 lb 6.4 oz (44.2 kg)   BMI 19.34 kg/m    Physical Exam Vitals and nursing note reviewed.  Constitutional:      Appearance: She is well-developed.  HENT:     Head: Normocephalic and atraumatic.  Eyes:     Conjunctiva/sclera: Conjunctivae normal.  Cardiovascular:     Rate and Rhythm: Normal rate and regular rhythm.     Heart sounds: Normal heart sounds.  Pulmonary:     Effort: Pulmonary effort is normal.     Breath sounds: Normal breath sounds.  Abdominal:     General: Bowel sounds are normal.     Palpations: Abdomen is soft.  Musculoskeletal:     Cervical back: Normal range of motion.  Lymphadenopathy:     Cervical: No cervical adenopathy.  Skin:    General: Skin is warm and dry.     Capillary Refill: Capillary refill takes less than 2 seconds.  Neurological:     Mental Status: She is alert and oriented to person, place, and time.  Psychiatric:        Behavior: Behavior normal.      Musculoskeletal Exam:  She had limited lateral rotation of the cervical spine.  Thoracic kyphosis without tenderness was noted.  Thoracolumbar scoliosis was noted.  Shoulders, wrists, MCPs were in good range of motion.  She had bilateral PIP and DIP thickening.  Inflammation was noted in her PIP joints.  Hip joints and knee joints in good range of motion without any warmth swelling or effusion.  There was no tenderness over ankles or MTPs.  CDAI Exam: CDAI Score: -- Patient Global: --; Provider Global: -- Swollen: 5 ; Tender: 5  Joint Exam 07/03/2024      Right  Left  PIP 2 (finger)  Swollen Tender     PIP 4 (finger)  Swollen Tender  Swollen Tender  PIP 5 (finger)  Swollen Tender  Swollen Tender     Investigation: No additional findings.  Imaging: No results found.  Recent Labs: Lab Results  Component Value Date   WBC 8.4 10/25/2022   HGB 13.2 10/25/2022   PLT 265 10/25/2022   NA 135 10/25/2022   K 4.9 10/25/2022   CL 100 10/25/2022   CO2 25 10/25/2022   GLUCOSE 87 10/25/2022   BUN 31 (H) 10/25/2022   CREATININE 0.59 (L) 10/25/2022   BILITOT 0.4 10/25/2022   ALKPHOS 35 (L) 11/17/2021   AST 16 10/25/2022   ALT 15 10/25/2022   PROT 7.2 10/25/2022   ALBUMIN  3.0 (L) 11/17/2021   CALCIUM 9.8 10/25/2022   GFRAA >90 09/09/2013    Speciality Comments: PLQ-Rash  Procedures:  No procedures performed Allergies: Plaquenil [hydroxychloroquine sulfate], Bee venom, Other, and Terconazole   Assessment / Plan:     Visit Diagnoses: Inflammatory arthritis - History of intermittent swelling in wrist joints, hands and knee joints.  She continues to have some swelling in her PIP joints.  She had good response to cortisone injections at the last visit.  We will hold off cortisone injection at this point.  She takes colchicine  on as needed basis.  Pseudogout - colchicine  0.6 milligram only on as needed basis.  She gets labs through her PCP.  I advised her to get  the labs forwarded to us .  Primary  osteoarthritis of both hands - Right [facility] surgery by Dr. Sissy. s/p cortisone injections on 10/23/2023: left index PIP and right small PIP, 07/23/2023.  She had bilateral ring PIP injections on January 22, 2024 which helped.  She will contact us  when she needs repeat injections.  Joint protection muscle strengthening was discussed.  S/P right rotator cuff repair- - By Dr. Addie.  She had good range of motion.  Primary osteoarthritis of both knees-patient states that she has been told that she has severe osteoarthritis of her knee joints.  She had cortisone injection by Dr. Addie yesterday.  She states the cortisone injections last for a long time.  DDD (degenerative disc disease), cervical-she is some limitation with range of motion of the cervical spine.  She denies any discomfort.  Spondylosis of lumbar spine-she states the thoracic and lumbar spine discomfort is manageable with stretching exercises.  Osteopenia of multiple sites - patient gets DEXA scan by her PCP.   Patient plans to discuss repeat DEXA scan with her PCP.  Medical problems are listed as follows:  History of pericarditis - In 1990s with no recurrence.  Syncope, unspecified syncope type - X 2.  GAD (generalized anxiety disorder)  GAVE (gastric antral vascular ectasia)  History of mitral valve prolapse  Facial pain - She had extensive ENT and neurology workup.  Seasonal allergic rhinitis due to pollen  Orders: No orders of the defined types were placed in this encounter.  No orders of the defined types were placed in this encounter.    Follow-Up Instructions: Return in about 6 months (around 12/31/2024) for Osteoarthritis.   Maya Nash, MD  Note - This record has been created using Animal nutritionist.  Chart creation errors have been sought, but may not always  have been located. Such creation errors do not reflect on  the standard of medical care.

## 2024-06-25 DIAGNOSIS — K219 Gastro-esophageal reflux disease without esophagitis: Secondary | ICD-10-CM | POA: Diagnosis not present

## 2024-06-25 DIAGNOSIS — R0789 Other chest pain: Secondary | ICD-10-CM | POA: Diagnosis not present

## 2024-06-30 ENCOUNTER — Other Ambulatory Visit (HOSPITAL_BASED_OUTPATIENT_CLINIC_OR_DEPARTMENT_OTHER): Payer: Self-pay

## 2024-06-30 MED ORDER — FLUZONE HIGH-DOSE 0.5 ML IM SUSY
0.5000 mL | PREFILLED_SYRINGE | Freq: Once | INTRAMUSCULAR | 0 refills | Status: AC
  Filled 2024-06-30: qty 0.5, 1d supply, fill #0

## 2024-07-02 ENCOUNTER — Ambulatory Visit: Payer: Self-pay | Admitting: Orthopedic Surgery

## 2024-07-02 ENCOUNTER — Encounter: Payer: Self-pay | Admitting: Orthopedic Surgery

## 2024-07-02 DIAGNOSIS — M170 Bilateral primary osteoarthritis of knee: Secondary | ICD-10-CM | POA: Diagnosis not present

## 2024-07-02 DIAGNOSIS — M1711 Unilateral primary osteoarthritis, right knee: Secondary | ICD-10-CM

## 2024-07-02 DIAGNOSIS — M1712 Unilateral primary osteoarthritis, left knee: Secondary | ICD-10-CM

## 2024-07-02 NOTE — Progress Notes (Signed)
 Office Visit Note   Patient: Haley Wade           Date of Birth: 1946/01/05           MRN: [id number] Visit Date: 07/02/2024 Requested by: Onita Rush, MD [address] [place],  [place] [address] PCP: Onita Rush, MD  Subjective: Chief Complaint  Patient presents with   Knee Pain    HPI: Haley Wade is a 78 y.o. female who presents to the office reporting bilateral knee pain left worse than right.  Wakes her from sleep at night.  Has some occasional buckling.  Bothers her this morning in the pool.  Right knee has a little bit of anterior knee pain.  She is very active at [employer].  Doing hospitality community.  She also just completed a walking program which was 336 miles in 8 weeks.  She does water aerobics.  She has had 2 surgeries this summer.  Has also had very good relief from prior injections.  Last knee injections performed 10/24/2023.  Helped until now..                ROS: All systems reviewed are negative as they relate to the chief complaint within the history of present illness.  Patient denies fevers or chills.  Assessment & Plan: Visit Diagnoses:  1. Unilateral primary osteoarthritis, right knee   2. Arthritis of left knee     Plan: Impression is bilateral knee pain and mild arthritis.  No effusion in either knee.  Will do bilateral knee injections today to see if that helps.  Follow-up with us  as needed.  I think she may have overdone it a little bit with the excessive walking during that 8 weeks time.  Follow-Up Instructions: No follow-ups on file.   Orders:  No orders of the defined types were placed in this encounter.  No orders of the defined types were placed in this encounter.     Procedures: Large Joint Inj: bilateral knee on 07/02/2024 10:37 PM Indications: diagnostic evaluation, joint swelling and pain Details: 18 G 1.5 in needle, superolateral approach  Arthrogram: No  Medications (Right): 5 mL lidocaine  1 %; 4 mL  bupivacaine  0.25 %; 40 mg triamcinolone  acetonide 40 MG/ML Medications (Left): 5 mL lidocaine  1 %; 4 mL bupivacaine  0.25 %; 40 mg triamcinolone  acetonide 40 MG/ML Outcome: tolerated well, no immediate complications Procedure, treatment alternatives, risks and benefits explained, specific risks discussed. Consent was given by the patient. Immediately prior to procedure a time out was called to verify the correct patient, procedure, equipment, support staff and site/side marked as required. Patient was prepped and draped in the usual sterile fashion.       Clinical Data: No additional findings.  Objective: Vital Signs: There were no vitals taken for this visit.  Physical Exam:  Constitutional: Patient appears well-developed HEENT:  Head: Normocephalic Eyes:EOM are normal Neck: Normal range of motion Cardiovascular: Normal rate Pulmonary/chest: Effort normal Neurologic: Patient is alert Skin: Skin is warm Psychiatric: Patient has normal mood and affect  Ortho Exam: Ortho exam demonstrates full range of motion of both knees.  No real effusion in either knee.  Collateral crucial ligaments are stable.  Not much patellofemoral crepitus is present.  No groin pain with internal/external rotation of the leg and no nerve root tension signs.  Extensor mechanism intact bilaterally.  Specialty Comments:  MRI LUMBAR SPINE WITHOUT CONTRAST   TECHNIQUE: Multiplanar, multisequence MR imaging of the lumbar spine was performed.  No intravenous contrast was administered.   COMPARISON:  None Available.   FINDINGS: Segmentation:  Standard.   Alignment:  2 mm anterolisthesis of L3 on L4.   Vertebrae: No acute fracture, evidence of discitis, or aggressive bone lesion.   Conus medullaris and cauda equina: Conus extends to the L1 level. Conus and cauda equina appear normal.   Paraspinal and other soft tissues: No acute paraspinal abnormality. 2 cm right hepatic cyst again noted better seen on  CT abdomen/pelvis dated 09/16/2021.   Disc levels:   Disc spaces: Degenerative disease with disc height loss at L3-4 and more severe at L4-5 and L5-S1. Disc desiccation throughout the remainder of the lumbar spine.   T12-L1: No significant disc bulge. No neural foraminal stenosis. No central canal stenosis.   L1-L2: Minimal broad-based disc bulge. No foraminal or central canal stenosis.   L2-L3: Mild broad-based disc bulge. Mild bilateral facet arthropathy. Mild spinal stenosis. No foraminal or central stenosis.   L3-L4: Minimal broad-based disc bulge with a broad shallow left paracentral/foraminal disc protrusion. Mild bilateral facet arthropathy. Mild spinal stenosis. Bilateral lateral recess narrowing.   L4-L5: Broad-based disc bulge. No foraminal or central canal stenosis.   L5-S1: Mild broad-based disc bulge. Mild bilateral foraminal stenosis. No spinal stenosis.   IMPRESSION: 1. Lumbar spine spondylosis as described above. 2.  No acute osseous injury of the lumbar spine.     Electronically Signed   By: Julaine Blanch M.D.   On: 04/24/2022 09:21  Imaging: No results found.   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   Inflammatory arthritis 11/16/2021   Complete tear of right rotator cuff    Biceps tendonitis on right    S/P rotator cuff repair 02/08/2021   Chest discomfort 01/26/2014   Palpitations 01/26/2014   Cellulitis 09/08/2013   Abdominal pain 02/26/2013   Hyponatremia 10/30/2012   COPD, questioned 01/02/2012   Allergic rhinitis due to pollen 10/20/2010   History of cardiovascular disorder 05/26/2009   Hearing  loss 05/21/2009   RHINOSINUSITIS, 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   Past Medical History:  Diagnosis Date   Allergic rhinitis, cause unspecified    Anemia    Arthritis    Cellulitis    Chest discomfort    COPD, questioned    GAVE (gastric antral vascular ectasia)    Hydrocephalus (HCC) 11/21/2023   Hyponatremia    MITRAL VALVE PROLAPSE, HX OF    Opacified ethmoid sinus 11/21/2023   Other chronic sinusitis    Palpitations    Syncope    Unspecified hearing loss     Family History  Problem Relation Age of Onset   Other Mother        c difficile gastroenteritis   Lung cancer Father        smoker   Liver disease Brother    Liver cancer Brother    Asthma Other        grandmother   Other Other        bronchitis-grandmother    Past Surgical History:  Procedure Laterality Date   APPENDECTOMY     BACK SURGERY     BICEPT TENODESIS Right 02/08/2021   Procedure: BICEPS TENODESIS;  Surgeon: Addie Cordella Hamilton,  MD;  Location: [facility] OR;  Service: Orthopedics;  Laterality: Right;   BLADDER REPAIR  02/2023   BLADDER SURGERY     BOWEL RESECTION N/A 11/01/2012   Procedure: SMALL BOWEL RESECTION;  Surgeon: Krystal CHRISTELLA Spinner, MD;  Location: [facility];  Service: General;  Laterality: N/A;   BREAST ENHANCEMENT SURGERY     revisions   BREAST SURGERY     CHOLECYSTECTOMY     COLON SURGERY     ESOPHAGOGASTRODUODENOSCOPY (EGD) WITH PROPOFOL  N/A 10/14/2019   Procedure: ESOPHAGOGASTRODUODENOSCOPY (EGD) WITH PROPOFOL ;  Surgeon: Rosalie Kitchens, MD;  Location: [facility];  Service: Endoscopy;  Laterality: N/A;   EYE SURGERY     GI RADIOFREQUENCY ABLATION  10/14/2019   Procedure: GI RADIOFREQUENCY ABLATION;  Surgeon: Rosalie Kitchens, MD;  Location: [facility];  Service: Endoscopy;;   LAPAROSCOPY N/A 11/01/2012   Procedure: LAPAROSCOPY DIAGNOSTIC;  Surgeon: Krystal CHRISTELLA Spinner, MD;  Location: [facility];  Service: General;  Laterality: N/A;   LAPAROTOMY N/A 11/01/2012   Procedure: EXPLORATORY LAPAROTOMY;  Surgeon: Krystal CHRISTELLA Spinner,  MD;  Location: [facility];  Service: General;  Laterality: N/A;   LYSIS OF ADHESION N/A 11/01/2012   Procedure: LYSIS OF ADHESION;  Surgeon: Krystal CHRISTELLA Spinner, MD;  Location: [facility];  Service: General;  Laterality: N/A;   RHINOPLASTY     SHOULDER ARTHROSCOPY WITH OPEN ROTATOR CUFF REPAIR AND DISTAL CLAVICLE ACROMINECTOMY Right 02/08/2021   Procedure: SHOULDER ARTHROSCOPY WITH BICEPS TENDON RELEASE, MINI OPEN ROTATOR CUFF TEAR REPAIR OF THE INFRASPINATUS SUPRASPINATUS AND UPPER PORTIION OF THE SUBSCAP  WITH BICEPS TENODESIS;  Surgeon: Addie Cordella Hamilton, MD;  Location: [facility] OR;  Service: Orthopedics;  Laterality: Right;   TONSILLECTOMY     TOTAL ABDOMINAL HYSTERECTOMY     Social History   Occupational History   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

## 2024-07-03 ENCOUNTER — Encounter: Payer: Self-pay | Admitting: Rheumatology

## 2024-07-03 ENCOUNTER — Ambulatory Visit: Attending: Rheumatology | Admitting: Rheumatology

## 2024-07-03 VITALS — BP 134/75 | HR 80 | Temp 97.6°F | Resp 14 | Ht 59.5 in | Wt 97.4 lb

## 2024-07-03 DIAGNOSIS — M1120 Other chondrocalcinosis, unspecified site: Secondary | ICD-10-CM | POA: Diagnosis not present

## 2024-07-03 DIAGNOSIS — R55 Syncope and collapse: Secondary | ICD-10-CM | POA: Insufficient documentation

## 2024-07-03 DIAGNOSIS — Z9889 Other specified postprocedural states: Secondary | ICD-10-CM | POA: Diagnosis not present

## 2024-07-03 DIAGNOSIS — Z8679 Personal history of other diseases of the circulatory system: Secondary | ICD-10-CM | POA: Insufficient documentation

## 2024-07-03 DIAGNOSIS — R519 Headache, unspecified: Secondary | ICD-10-CM | POA: Diagnosis not present

## 2024-07-03 DIAGNOSIS — M19042 Primary osteoarthritis, left hand: Secondary | ICD-10-CM | POA: Diagnosis not present

## 2024-07-03 DIAGNOSIS — M1380 Other specified arthritis, unspecified site: Secondary | ICD-10-CM | POA: Diagnosis not present

## 2024-07-03 DIAGNOSIS — M5030 Other cervical disc degeneration, unspecified cervical region: Secondary | ICD-10-CM | POA: Diagnosis not present

## 2024-07-03 DIAGNOSIS — M8589 Other specified disorders of bone density and structure, multiple sites: Secondary | ICD-10-CM | POA: Insufficient documentation

## 2024-07-03 DIAGNOSIS — M170 Bilateral primary osteoarthritis of knee: Secondary | ICD-10-CM | POA: Insufficient documentation

## 2024-07-03 DIAGNOSIS — K31819 Angiodysplasia of stomach and duodenum without bleeding: Secondary | ICD-10-CM | POA: Insufficient documentation

## 2024-07-03 DIAGNOSIS — J301 Allergic rhinitis due to pollen: Secondary | ICD-10-CM | POA: Diagnosis not present

## 2024-07-03 DIAGNOSIS — M19041 Primary osteoarthritis, right hand: Secondary | ICD-10-CM | POA: Diagnosis not present

## 2024-07-03 DIAGNOSIS — M47816 Spondylosis without myelopathy or radiculopathy, lumbar region: Secondary | ICD-10-CM | POA: Insufficient documentation

## 2024-07-03 DIAGNOSIS — F411 Generalized anxiety disorder: Secondary | ICD-10-CM | POA: Insufficient documentation

## 2024-07-05 MED ORDER — LIDOCAINE HCL 1 % IJ SOLN
5.0000 mL | INTRAMUSCULAR | Status: AC | PRN
  Administered 2024-07-02: 5 mL

## 2024-07-05 MED ORDER — TRIAMCINOLONE ACETONIDE 40 MG/ML IJ SUSP
40.0000 mg | INTRAMUSCULAR | Status: AC | PRN
  Administered 2024-07-02: 40 mg via INTRA_ARTICULAR

## 2024-07-05 MED ORDER — BUPIVACAINE HCL 0.25 % IJ SOLN
4.0000 mL | INTRAMUSCULAR | Status: AC | PRN
  Administered 2024-07-02: 4 mL via INTRA_ARTICULAR

## 2024-07-14 ENCOUNTER — Ambulatory Visit: Admitting: Psychiatry

## 2024-07-17 ENCOUNTER — Ambulatory Visit: Admitting: Orthopedic Surgery

## 2024-07-28 ENCOUNTER — Ambulatory Visit (HOSPITAL_COMMUNITY): Admission: RE | Admit: 2024-07-28 | Discharge: 2024-07-28 | Attending: Cardiology | Admitting: Cardiology

## 2024-07-28 DIAGNOSIS — I059 Rheumatic mitral valve disease, unspecified: Secondary | ICD-10-CM

## 2024-07-28 LAB — ECHOCARDIOGRAM COMPLETE
Area-P 1/2: 3.01 cm2
S' Lateral: 2.51 cm

## 2024-07-29 ENCOUNTER — Ambulatory Visit: Admitting: Rheumatology

## 2024-07-31 ENCOUNTER — Ambulatory Visit: Payer: Self-pay | Admitting: Cardiology

## 2024-09-09 ENCOUNTER — Telehealth: Payer: Self-pay | Admitting: Cardiology

## 2024-09-09 MED ORDER — METOPROLOL SUCCINATE ER 25 MG PO TB24: ORAL_TABLET | ORAL | 3 refills | Status: AC

## 2024-09-09 NOTE — Telephone Encounter (Signed)
 Spoke with patient regarding elevated heart rate  Last night around 6:09 pm HR up to 124-131.  Took Metoprolol  25 mg 1/2 tablet, no improvement so took the other 1/2.  Subsided around 7:50 pm Apple watch showed tachycardia, no Afib detected During episode felt some shortness of breath and felt a little tight in chest Stated today feels fine just a little tired   Had episode in October, December, and now  This is the longest episode of the 3  Denies caffeine or alcohol  use  Unable to check blood pressure without going to nurse at [facility] and has not because so cold out  Will forward to Reche ORN NP for review

## 2024-09-09 NOTE — Telephone Encounter (Signed)
 Advised patient, verbalized understanding  Will report back in couple weeks with blood pressure readings

## 2024-09-09 NOTE — Telephone Encounter (Signed)
 Patient c/o Palpitations:  STAT if patient reporting lightheadedness, shortness of breath, or chest pain  How long have you had palpitations/irregular HR/ Afib? Are you having the symptoms now? Started last night, no  Are you currently experiencing lightheadedness, SOB or CP? no  Do you have a history of afib (atrial fibrillation) or irregular heart rhythm? tachycardia  Have you checked your BP or HR? (document readings if available): HR 124-131  Are you experiencing any other symptoms? Chest tightness

## 2024-09-09 NOTE — Telephone Encounter (Signed)
 Given more frequent episodes of palpitations, could consider start Metoprolol  Succinate 12.5mg  (half of 25mg  tablet) daily. This is a longer acting agent to prevent palpitations. Could continue to use Metoprolol  Tartrate as needed for break through palpitations. If she opts to start Metoprolol  Succinate, would check BP at least 3x per week and report back readings in 1-2 weeks. If low BP, may have to reduce dose of Spironolactone  or Minoxidil as previously discussed with Dr. Lonni.   If she wishes to continue monitoring, may continue to use Metoprolol  Tartrate as needed though would suggest start with Metoprolol  Tartrate 25mg  dose as seems  more effective for her.  Naod Sweetland S Emorie Mcfate, NP

## 2024-09-18 ENCOUNTER — Ambulatory Visit: Admitting: Rheumatology

## 2024-09-18 ENCOUNTER — Encounter: Payer: Self-pay | Admitting: Rheumatology

## 2024-09-18 ENCOUNTER — Ambulatory Visit

## 2024-09-18 VITALS — BP 108/67 | HR 77 | Temp 97.4°F | Resp 16 | Ht 59.5 in | Wt 97.6 lb

## 2024-09-18 DIAGNOSIS — M8589 Other specified disorders of bone density and structure, multiple sites: Secondary | ICD-10-CM | POA: Diagnosis not present

## 2024-09-18 DIAGNOSIS — R55 Syncope and collapse: Secondary | ICD-10-CM | POA: Diagnosis not present

## 2024-09-18 DIAGNOSIS — M79645 Pain in left finger(s): Secondary | ICD-10-CM

## 2024-09-18 DIAGNOSIS — Z8679 Personal history of other diseases of the circulatory system: Secondary | ICD-10-CM | POA: Diagnosis not present

## 2024-09-18 DIAGNOSIS — R519 Headache, unspecified: Secondary | ICD-10-CM

## 2024-09-18 DIAGNOSIS — M19042 Primary osteoarthritis, left hand: Secondary | ICD-10-CM

## 2024-09-18 DIAGNOSIS — M1380 Other specified arthritis, unspecified site: Secondary | ICD-10-CM | POA: Diagnosis not present

## 2024-09-18 DIAGNOSIS — F411 Generalized anxiety disorder: Secondary | ICD-10-CM

## 2024-09-18 DIAGNOSIS — M5030 Other cervical disc degeneration, unspecified cervical region: Secondary | ICD-10-CM | POA: Diagnosis not present

## 2024-09-18 DIAGNOSIS — M1120 Other chondrocalcinosis, unspecified site: Secondary | ICD-10-CM | POA: Diagnosis not present

## 2024-09-18 DIAGNOSIS — J301 Allergic rhinitis due to pollen: Secondary | ICD-10-CM

## 2024-09-18 DIAGNOSIS — Z9889 Other specified postprocedural states: Secondary | ICD-10-CM

## 2024-09-18 DIAGNOSIS — M47816 Spondylosis without myelopathy or radiculopathy, lumbar region: Secondary | ICD-10-CM

## 2024-09-18 DIAGNOSIS — M79644 Pain in right finger(s): Secondary | ICD-10-CM

## 2024-09-18 DIAGNOSIS — K31819 Angiodysplasia of stomach and duodenum without bleeding: Secondary | ICD-10-CM | POA: Diagnosis not present

## 2024-09-18 DIAGNOSIS — M170 Bilateral primary osteoarthritis of knee: Secondary | ICD-10-CM

## 2024-09-18 MED ORDER — LIDOCAINE HCL 1 % IJ SOLN
0.5000 mL | INTRAMUSCULAR | Status: AC | PRN
  Administered 2024-09-18: .5 mL

## 2024-09-18 MED ORDER — TRIAMCINOLONE ACETONIDE 40 MG/ML IJ SUSP
20.0000 mg | INTRAMUSCULAR | Status: AC | PRN
  Administered 2024-09-18: 20 mg via INTRA_ARTICULAR

## 2024-10-13 ENCOUNTER — Ambulatory Visit: Admitting: Psychiatry

## 2024-12-10 ENCOUNTER — Ambulatory Visit (HOSPITAL_BASED_OUTPATIENT_CLINIC_OR_DEPARTMENT_OTHER): Admitting: Cardiology

## 2024-12-31 ENCOUNTER — Ambulatory Visit: Admitting: Rheumatology
